# Patient Record
Sex: Female | Born: 1940 | Race: Black or African American | Hispanic: No | Smoking: Never smoker
Health system: Southern US, Community
[De-identification: ages and names within clinical notes are randomized; demographics above are authoritative.]

## PROBLEM LIST (undated history)

## (undated) DIAGNOSIS — I10 Essential (primary) hypertension: Secondary | ICD-10-CM

## (undated) DIAGNOSIS — M1990 Unspecified osteoarthritis, unspecified site: Secondary | ICD-10-CM

## (undated) DIAGNOSIS — D693 Immune thrombocytopenic purpura: Secondary | ICD-10-CM

## (undated) DIAGNOSIS — R32 Unspecified urinary incontinence: Secondary | ICD-10-CM

## (undated) DIAGNOSIS — M109 Gout, unspecified: Secondary | ICD-10-CM

## (undated) DIAGNOSIS — D509 Iron deficiency anemia, unspecified: Secondary | ICD-10-CM

## (undated) DIAGNOSIS — K219 Gastro-esophageal reflux disease without esophagitis: Secondary | ICD-10-CM

## (undated) DIAGNOSIS — J309 Allergic rhinitis, unspecified: Secondary | ICD-10-CM

## (undated) DIAGNOSIS — C9210 Chronic myeloid leukemia, BCR/ABL-positive, not having achieved remission: Secondary | ICD-10-CM

## (undated) DIAGNOSIS — E785 Hyperlipidemia, unspecified: Secondary | ICD-10-CM

## (undated) DIAGNOSIS — Z8639 Personal history of other endocrine, nutritional and metabolic disease: Secondary | ICD-10-CM

## (undated) DIAGNOSIS — E039 Hypothyroidism, unspecified: Secondary | ICD-10-CM

## (undated) HISTORY — DX: Gastro-esophageal reflux disease without esophagitis: K21.9

## (undated) HISTORY — DX: Hypothyroidism, unspecified: E03.9

## (undated) HISTORY — DX: Iron deficiency anemia, unspecified: D50.9

## (undated) HISTORY — DX: Personal history of other endocrine, nutritional and metabolic disease: Z86.39

## (undated) HISTORY — DX: Chronic myeloid leukemia, BCR/ABL-positive, not having achieved remission: C92.10

## (undated) HISTORY — DX: Immune thrombocytopenic purpura: D69.3

## (undated) HISTORY — DX: Unspecified osteoarthritis, unspecified site: M19.90

## (undated) HISTORY — DX: Allergic rhinitis, unspecified: J30.9

## (undated) HISTORY — DX: Gout, unspecified: M10.9

## (undated) HISTORY — DX: Hyperlipidemia, unspecified: E78.5

## (undated) HISTORY — DX: Unspecified urinary incontinence: R32

---

## 2004-07-16 ENCOUNTER — Ambulatory Visit: Payer: Self-pay | Admitting: Internal Medicine

## 2004-08-13 ENCOUNTER — Ambulatory Visit: Payer: Self-pay | Admitting: Internal Medicine

## 2004-10-17 ENCOUNTER — Ambulatory Visit: Payer: Self-pay | Admitting: Internal Medicine

## 2004-11-13 ENCOUNTER — Ambulatory Visit: Payer: Self-pay | Admitting: Internal Medicine

## 2004-12-11 ENCOUNTER — Ambulatory Visit: Payer: Self-pay | Admitting: Internal Medicine

## 2005-01-14 ENCOUNTER — Ambulatory Visit: Payer: Self-pay | Admitting: Internal Medicine

## 2005-01-14 ENCOUNTER — Ambulatory Visit: Payer: Self-pay | Admitting: Family Medicine

## 2005-01-27 ENCOUNTER — Ambulatory Visit: Payer: Self-pay | Admitting: Family Medicine

## 2005-02-10 ENCOUNTER — Ambulatory Visit: Payer: Self-pay | Admitting: Internal Medicine

## 2005-03-13 ENCOUNTER — Ambulatory Visit: Payer: Self-pay | Admitting: Internal Medicine

## 2005-04-18 ENCOUNTER — Ambulatory Visit: Payer: Self-pay | Admitting: Internal Medicine

## 2005-05-13 ENCOUNTER — Ambulatory Visit: Payer: Self-pay | Admitting: Internal Medicine

## 2005-06-13 ENCOUNTER — Ambulatory Visit: Payer: Self-pay | Admitting: Internal Medicine

## 2005-07-13 ENCOUNTER — Ambulatory Visit: Payer: Self-pay | Admitting: Internal Medicine

## 2005-08-13 ENCOUNTER — Ambulatory Visit: Payer: Self-pay | Admitting: Internal Medicine

## 2005-08-22 ENCOUNTER — Ambulatory Visit: Payer: Self-pay | Admitting: Family Medicine

## 2005-09-12 ENCOUNTER — Ambulatory Visit: Payer: Self-pay | Admitting: Internal Medicine

## 2005-10-13 ENCOUNTER — Ambulatory Visit: Payer: Self-pay | Admitting: Internal Medicine

## 2005-11-13 ENCOUNTER — Ambulatory Visit: Payer: Self-pay | Admitting: Internal Medicine

## 2005-12-11 ENCOUNTER — Ambulatory Visit: Payer: Self-pay | Admitting: Internal Medicine

## 2006-01-11 ENCOUNTER — Ambulatory Visit: Payer: Self-pay | Admitting: Internal Medicine

## 2006-02-10 ENCOUNTER — Ambulatory Visit: Payer: Self-pay | Admitting: Internal Medicine

## 2006-02-18 ENCOUNTER — Ambulatory Visit: Payer: Self-pay

## 2006-03-13 ENCOUNTER — Ambulatory Visit: Payer: Self-pay | Admitting: Internal Medicine

## 2006-06-22 ENCOUNTER — Emergency Department: Payer: Self-pay | Admitting: Unknown Physician Specialty

## 2006-07-09 ENCOUNTER — Ambulatory Visit: Payer: Self-pay | Admitting: Internal Medicine

## 2006-07-13 ENCOUNTER — Ambulatory Visit: Payer: Self-pay | Admitting: Internal Medicine

## 2006-08-13 ENCOUNTER — Ambulatory Visit: Payer: Self-pay | Admitting: Internal Medicine

## 2006-12-07 ENCOUNTER — Ambulatory Visit: Payer: Self-pay | Admitting: Internal Medicine

## 2006-12-12 ENCOUNTER — Ambulatory Visit: Payer: Self-pay | Admitting: Internal Medicine

## 2007-04-20 ENCOUNTER — Ambulatory Visit: Payer: Self-pay

## 2007-05-14 ENCOUNTER — Ambulatory Visit: Payer: Self-pay | Admitting: Internal Medicine

## 2007-05-24 ENCOUNTER — Ambulatory Visit: Payer: Self-pay | Admitting: Internal Medicine

## 2007-06-14 ENCOUNTER — Ambulatory Visit: Payer: Self-pay | Admitting: Internal Medicine

## 2007-09-13 ENCOUNTER — Ambulatory Visit: Payer: Self-pay | Admitting: Internal Medicine

## 2007-09-21 ENCOUNTER — Ambulatory Visit: Payer: Self-pay | Admitting: Internal Medicine

## 2007-10-14 ENCOUNTER — Ambulatory Visit: Payer: Self-pay | Admitting: Internal Medicine

## 2008-01-12 ENCOUNTER — Ambulatory Visit: Payer: Self-pay | Admitting: Internal Medicine

## 2008-01-25 ENCOUNTER — Ambulatory Visit: Payer: Self-pay | Admitting: Internal Medicine

## 2008-02-11 ENCOUNTER — Ambulatory Visit: Payer: Self-pay | Admitting: Internal Medicine

## 2008-03-21 ENCOUNTER — Ambulatory Visit: Payer: Self-pay | Admitting: Internal Medicine

## 2008-04-12 ENCOUNTER — Ambulatory Visit: Payer: Self-pay | Admitting: Internal Medicine

## 2008-04-21 ENCOUNTER — Ambulatory Visit: Payer: Self-pay

## 2008-05-13 ENCOUNTER — Ambulatory Visit: Payer: Self-pay | Admitting: Internal Medicine

## 2008-05-24 ENCOUNTER — Ambulatory Visit: Payer: Self-pay | Admitting: Internal Medicine

## 2008-06-13 ENCOUNTER — Ambulatory Visit: Payer: Self-pay | Admitting: Internal Medicine

## 2008-07-19 ENCOUNTER — Ambulatory Visit: Payer: Self-pay | Admitting: Internal Medicine

## 2008-09-12 ENCOUNTER — Ambulatory Visit: Payer: Self-pay | Admitting: Internal Medicine

## 2008-09-13 ENCOUNTER — Ambulatory Visit: Payer: Self-pay | Admitting: Internal Medicine

## 2008-10-13 ENCOUNTER — Ambulatory Visit: Payer: Self-pay | Admitting: Internal Medicine

## 2008-11-13 ENCOUNTER — Ambulatory Visit: Payer: Self-pay | Admitting: Internal Medicine

## 2008-12-11 ENCOUNTER — Ambulatory Visit: Payer: Self-pay | Admitting: Internal Medicine

## 2009-01-03 ENCOUNTER — Ambulatory Visit: Payer: Self-pay | Admitting: Internal Medicine

## 2009-01-11 ENCOUNTER — Ambulatory Visit: Payer: Self-pay | Admitting: Internal Medicine

## 2009-02-10 ENCOUNTER — Ambulatory Visit: Payer: Self-pay | Admitting: Internal Medicine

## 2009-03-13 ENCOUNTER — Ambulatory Visit: Payer: Self-pay | Admitting: Internal Medicine

## 2009-04-04 ENCOUNTER — Ambulatory Visit: Payer: Self-pay | Admitting: Internal Medicine

## 2009-04-12 ENCOUNTER — Ambulatory Visit: Payer: Self-pay | Admitting: Internal Medicine

## 2009-04-23 ENCOUNTER — Ambulatory Visit: Payer: Self-pay

## 2009-05-13 ENCOUNTER — Ambulatory Visit: Payer: Self-pay | Admitting: Internal Medicine

## 2009-06-13 ENCOUNTER — Ambulatory Visit: Payer: Self-pay | Admitting: Internal Medicine

## 2009-07-13 ENCOUNTER — Ambulatory Visit: Payer: Self-pay | Admitting: Internal Medicine

## 2009-07-25 ENCOUNTER — Ambulatory Visit: Payer: Self-pay | Admitting: Internal Medicine

## 2009-08-13 ENCOUNTER — Ambulatory Visit: Payer: Self-pay | Admitting: Internal Medicine

## 2009-09-19 ENCOUNTER — Ambulatory Visit: Payer: Self-pay | Admitting: Internal Medicine

## 2009-10-13 ENCOUNTER — Ambulatory Visit: Payer: Self-pay | Admitting: Internal Medicine

## 2009-11-13 ENCOUNTER — Ambulatory Visit: Payer: Self-pay | Admitting: Internal Medicine

## 2009-11-14 ENCOUNTER — Ambulatory Visit: Payer: Self-pay | Admitting: Internal Medicine

## 2009-12-11 ENCOUNTER — Ambulatory Visit: Payer: Self-pay | Admitting: Internal Medicine

## 2009-12-20 ENCOUNTER — Ambulatory Visit: Payer: Self-pay | Admitting: Sports Medicine

## 2010-01-11 ENCOUNTER — Ambulatory Visit: Payer: Self-pay | Admitting: Internal Medicine

## 2010-03-06 ENCOUNTER — Ambulatory Visit: Payer: Self-pay | Admitting: Internal Medicine

## 2010-03-13 ENCOUNTER — Ambulatory Visit: Payer: Self-pay | Admitting: Internal Medicine

## 2010-04-12 ENCOUNTER — Ambulatory Visit: Payer: Self-pay | Admitting: Internal Medicine

## 2010-04-25 ENCOUNTER — Ambulatory Visit: Payer: Self-pay | Admitting: Family Medicine

## 2010-05-01 ENCOUNTER — Ambulatory Visit: Payer: Self-pay | Admitting: Internal Medicine

## 2010-05-13 ENCOUNTER — Ambulatory Visit: Payer: Self-pay | Admitting: Internal Medicine

## 2010-06-13 ENCOUNTER — Ambulatory Visit: Payer: Self-pay | Admitting: Internal Medicine

## 2010-06-25 ENCOUNTER — Ambulatory Visit: Payer: Self-pay | Admitting: Internal Medicine

## 2010-07-13 ENCOUNTER — Ambulatory Visit: Payer: Self-pay | Admitting: Internal Medicine

## 2010-08-20 ENCOUNTER — Ambulatory Visit: Payer: Self-pay | Admitting: Internal Medicine

## 2010-09-12 ENCOUNTER — Ambulatory Visit: Payer: Self-pay | Admitting: Internal Medicine

## 2010-10-22 ENCOUNTER — Ambulatory Visit: Payer: Self-pay | Admitting: Internal Medicine

## 2010-11-13 ENCOUNTER — Ambulatory Visit: Payer: Self-pay | Admitting: Internal Medicine

## 2010-12-17 ENCOUNTER — Ambulatory Visit: Payer: Self-pay | Admitting: Internal Medicine

## 2011-01-12 ENCOUNTER — Ambulatory Visit: Payer: Self-pay | Admitting: Internal Medicine

## 2011-02-11 ENCOUNTER — Ambulatory Visit: Payer: Self-pay | Admitting: Internal Medicine

## 2011-03-14 ENCOUNTER — Ambulatory Visit: Payer: Self-pay | Admitting: Internal Medicine

## 2011-04-13 ENCOUNTER — Ambulatory Visit: Payer: Self-pay | Admitting: Internal Medicine

## 2011-05-27 ENCOUNTER — Ambulatory Visit: Payer: Self-pay | Admitting: Urology

## 2011-06-03 ENCOUNTER — Ambulatory Visit: Payer: Self-pay | Admitting: Family Medicine

## 2011-06-03 ENCOUNTER — Ambulatory Visit: Payer: Self-pay | Admitting: Internal Medicine

## 2011-06-14 ENCOUNTER — Ambulatory Visit: Payer: Self-pay | Admitting: Internal Medicine

## 2011-08-05 ENCOUNTER — Ambulatory Visit: Payer: Self-pay | Admitting: Internal Medicine

## 2011-08-14 ENCOUNTER — Ambulatory Visit: Payer: Self-pay | Admitting: Internal Medicine

## 2011-09-30 ENCOUNTER — Ambulatory Visit: Payer: Self-pay | Admitting: Internal Medicine

## 2011-10-14 ENCOUNTER — Ambulatory Visit: Payer: Self-pay | Admitting: Internal Medicine

## 2011-12-16 ENCOUNTER — Ambulatory Visit: Payer: Self-pay | Admitting: Internal Medicine

## 2011-12-16 LAB — CBC CANCER CENTER
Bands: 1 %
Basophil %: 0.5 %
Basophil: 1 %
Eosinophil #: 0.2 x10 3/mm (ref 0.0–0.7)
Eosinophil %: 2.7 %
Eosinophil: 2 %
HCT: 37.1 % (ref 35.0–47.0)
HGB: 12.2 g/dL (ref 12.0–16.0)
Lymphocyte #: 1.9 x10 3/mm (ref 1.0–3.6)
MCH: 28.8 pg (ref 26.0–34.0)
MCHC: 32.9 g/dL (ref 32.0–36.0)
Monocyte #: 3.1 x10 3/mm — ABNORMAL HIGH (ref 0.0–0.7)
Neutrophil %: 39.4 %
RBC: 4.24 10*6/uL (ref 3.80–5.20)
Segmented Neutrophils: 37 %

## 2011-12-16 LAB — CREATININE, SERUM: EGFR (African American): 51 — ABNORMAL LOW

## 2011-12-19 LAB — BASIC METABOLIC PANEL
Anion Gap: 6 — ABNORMAL LOW (ref 7–16)
Calcium, Total: 9 mg/dL (ref 8.5–10.1)
Chloride: 106 mmol/L (ref 98–107)
Co2: 30 mmol/L (ref 21–32)
Creatinine: 1.3 mg/dL (ref 0.60–1.30)
Osmolality: 286 (ref 275–301)

## 2012-01-12 ENCOUNTER — Ambulatory Visit: Payer: Self-pay | Admitting: Internal Medicine

## 2012-01-27 LAB — CREATININE, SERUM
EGFR (African American): 47 — ABNORMAL LOW
EGFR (Non-African Amer.): 40 — ABNORMAL LOW

## 2012-01-27 LAB — CBC CANCER CENTER
Basophil #: 0.3 x10 3/mm — ABNORMAL HIGH (ref 0.0–0.1)
Basophil %: 2.7 %
Basophil: 1 %
Comment - H1-Com2: NORMAL
HCT: 37.1 % (ref 35.0–47.0)
HGB: 12.2 g/dL (ref 12.0–16.0)
Lymphocytes: 18 %
MCH: 29.1 pg (ref 26.0–34.0)
MCHC: 32.8 g/dL (ref 32.0–36.0)
Monocyte #: 3.8 x10 3/mm — ABNORMAL HIGH (ref 0.2–0.9)
Monocyte %: 36.9 %
Neutrophil #: 4.2 x10 3/mm (ref 1.4–6.5)
Neutrophil %: 41.2 %
Platelet: 80 x10 3/mm — ABNORMAL LOW (ref 150–440)
Segmented Neutrophils: 48 %
WBC: 10.2 x10 3/mm (ref 3.6–11.0)

## 2012-01-27 LAB — LACTATE DEHYDROGENASE: LDH: 156 U/L (ref 84–246)

## 2012-02-11 ENCOUNTER — Ambulatory Visit: Payer: Self-pay | Admitting: Internal Medicine

## 2012-03-23 ENCOUNTER — Ambulatory Visit: Payer: Self-pay | Admitting: Internal Medicine

## 2012-03-23 LAB — CREATININE, SERUM
Creatinine: 1.24 mg/dL (ref 0.60–1.30)
EGFR (Non-African Amer.): 44 — ABNORMAL LOW

## 2012-03-23 LAB — LACTATE DEHYDROGENASE: LDH: 142 U/L (ref 84–246)

## 2012-03-23 LAB — CBC CANCER CENTER
Eosinophil #: 0.2 x10 3/mm (ref 0.0–0.7)
Eosinophil %: 2.7 %
Lymphocyte #: 1.7 x10 3/mm (ref 1.0–3.6)
MCH: 29.1 pg (ref 26.0–34.0)
MCHC: 33.1 g/dL (ref 32.0–36.0)
MCV: 88 fL (ref 80–100)
Neutrophil #: 3.1 x10 3/mm (ref 1.4–6.5)
Platelet: 67 x10 3/mm — ABNORMAL LOW (ref 150–440)
WBC: 8.8 x10 3/mm (ref 3.6–11.0)

## 2012-04-12 ENCOUNTER — Ambulatory Visit: Payer: Self-pay | Admitting: Internal Medicine

## 2012-05-18 ENCOUNTER — Ambulatory Visit: Payer: Self-pay | Admitting: Internal Medicine

## 2012-05-18 LAB — CBC CANCER CENTER
Eosinophil: 4 %
HCT: 38.6 % (ref 35.0–47.0)
Lymphocytes: 22 %
MCV: 88 fL (ref 80–100)
Monocytes: 35 %
Platelet: 85 x10 3/mm — ABNORMAL LOW (ref 150–440)
RBC: 4.38 10*6/uL (ref 3.80–5.20)
RDW: 14.3 % (ref 11.5–14.5)
WBC: 12.1 x10 3/mm — ABNORMAL HIGH (ref 3.6–11.0)

## 2012-05-18 LAB — CREATININE, SERUM
Creatinine: 1.27 mg/dL (ref 0.60–1.30)
EGFR (African American): 50 — ABNORMAL LOW
EGFR (Non-African Amer.): 43 — ABNORMAL LOW

## 2012-05-18 LAB — URIC ACID: Uric Acid: 7.8 mg/dL — ABNORMAL HIGH (ref 2.6–6.0)

## 2012-06-13 ENCOUNTER — Ambulatory Visit: Payer: Self-pay | Admitting: Internal Medicine

## 2012-07-23 ENCOUNTER — Ambulatory Visit: Payer: Self-pay | Admitting: Family Medicine

## 2012-09-14 ENCOUNTER — Ambulatory Visit: Payer: Self-pay | Admitting: Internal Medicine

## 2012-09-14 LAB — URIC ACID: Uric Acid: 7.3 mg/dL — ABNORMAL HIGH (ref 2.6–6.0)

## 2012-09-14 LAB — CBC CANCER CENTER
Comment - H1-Com1: NORMAL
MCH: 28.7 pg (ref 26.0–34.0)
MCHC: 32.3 g/dL (ref 32.0–36.0)
MCV: 89 fL (ref 80–100)
Monocytes: 33 %
Platelet: 81 x10 3/mm — ABNORMAL LOW (ref 150–440)
RBC: 4.47 10*6/uL (ref 3.80–5.20)
RDW: 13.7 % (ref 11.5–14.5)
Segmented Neutrophils: 43 %

## 2012-09-14 LAB — CREATININE, SERUM
Creatinine: 1.44 mg/dL — ABNORMAL HIGH (ref 0.60–1.30)
EGFR (Non-African Amer.): 37 — ABNORMAL LOW

## 2012-09-28 LAB — CREATININE, SERUM
EGFR (African American): 47 — ABNORMAL LOW
EGFR (Non-African Amer.): 40 — ABNORMAL LOW

## 2012-10-13 ENCOUNTER — Ambulatory Visit: Payer: Self-pay | Admitting: Internal Medicine

## 2012-10-26 LAB — CBC CANCER CENTER
Basophil: 1 %
Eosinophil: 2 %
HGB: 12.6 g/dL (ref 12.0–16.0)
Lymphocytes: 20 %
MCH: 28.3 pg (ref 26.0–34.0)
Monocytes: 33 %
RDW: 14 % (ref 11.5–14.5)
WBC: 8.1 x10 3/mm (ref 3.6–11.0)

## 2012-10-26 LAB — LACTATE DEHYDROGENASE: LDH: 172 U/L (ref 81–246)

## 2012-10-26 LAB — CREATININE, SERUM
Creatinine: 1.24 mg/dL (ref 0.60–1.30)
EGFR (African American): 51 — ABNORMAL LOW

## 2012-11-13 ENCOUNTER — Ambulatory Visit: Payer: Self-pay | Admitting: Internal Medicine

## 2013-01-18 ENCOUNTER — Ambulatory Visit: Payer: Self-pay | Admitting: Internal Medicine

## 2013-01-18 LAB — CBC CANCER CENTER
Bands: 2 %
Basophil: 3 %
HCT: 31.1 % — ABNORMAL LOW (ref 35.0–47.0)
HGB: 10 g/dL — ABNORMAL LOW (ref 12.0–16.0)
Lymphocytes: 9 %
MCHC: 32 g/dL (ref 32.0–36.0)
MCV: 85 fL (ref 80–100)
RBC: 3.66 10*6/uL — ABNORMAL LOW (ref 3.80–5.20)
RDW: 13.7 % (ref 11.5–14.5)
Segmented Neutrophils: 42 %
WBC: 13.9 x10 3/mm — ABNORMAL HIGH (ref 3.6–11.0)

## 2013-01-18 LAB — LACTATE DEHYDROGENASE: LDH: 148 U/L (ref 81–246)

## 2013-01-18 LAB — CREATININE, SERUM: EGFR (African American): 39 — ABNORMAL LOW

## 2013-01-26 ENCOUNTER — Ambulatory Visit: Payer: Self-pay | Admitting: Family Medicine

## 2013-02-01 LAB — BASIC METABOLIC PANEL
Anion Gap: 11 (ref 7–16)
BUN: 28 mg/dL — ABNORMAL HIGH (ref 7–18)
Chloride: 100 mmol/L (ref 98–107)
Co2: 27 mmol/L (ref 21–32)
Creatinine: 1.86 mg/dL — ABNORMAL HIGH (ref 0.60–1.30)
Glucose: 116 mg/dL — ABNORMAL HIGH (ref 65–99)

## 2013-02-01 LAB — CREATININE, SERUM
Creatinine: 1.89 mg/dL — ABNORMAL HIGH (ref 0.60–1.30)
EGFR (Non-African Amer.): 27 — ABNORMAL LOW

## 2013-02-04 LAB — CBC CANCER CENTER
Bands: 1 %
Eosinophil: 1 %
HCT: 31 % — ABNORMAL LOW (ref 35.0–47.0)
HGB: 10.1 g/dL — ABNORMAL LOW (ref 12.0–16.0)
Lymphocytes: 13 %
RBC: 3.76 10*6/uL — ABNORMAL LOW (ref 3.80–5.20)
Segmented Neutrophils: 59 %
WBC: 14 x10 3/mm — ABNORMAL HIGH (ref 3.6–11.0)

## 2013-02-10 ENCOUNTER — Ambulatory Visit: Payer: Self-pay | Admitting: Internal Medicine

## 2013-03-15 ENCOUNTER — Ambulatory Visit: Payer: Self-pay | Admitting: Internal Medicine

## 2013-03-15 LAB — CBC CANCER CENTER
Basophil %: 2.8 %
Eosinophil #: 0.3 x10 3/mm (ref 0.0–0.7)
Eosinophil: 2 %
HCT: 28.3 % — ABNORMAL LOW (ref 35.0–47.0)
HGB: 9.2 g/dL — ABNORMAL LOW (ref 12.0–16.0)
Lymphocyte #: 2.1 x10 3/mm (ref 1.0–3.6)
Lymphocyte %: 16.8 %
Monocyte %: 25.3 %
Neutrophil #: 6.6 x10 3/mm — ABNORMAL HIGH (ref 1.4–6.5)
Neutrophil %: 53 %
RDW: 16.8 % — ABNORMAL HIGH (ref 11.5–14.5)
WBC: 12.5 x10 3/mm — ABNORMAL HIGH (ref 3.6–11.0)

## 2013-03-15 LAB — URIC ACID: Uric Acid: 12.2 mg/dL — ABNORMAL HIGH (ref 2.6–6.0)

## 2013-03-15 LAB — CREATININE, SERUM
EGFR (African American): 33 — ABNORMAL LOW
EGFR (Non-African Amer.): 29 — ABNORMAL LOW

## 2013-03-29 LAB — CBC CANCER CENTER
Basophil %: 3.5 %
Eosinophil #: 0.1 x10 3/mm (ref 0.0–0.7)
Eosinophil %: 1.2 %
HGB: 8.9 g/dL — ABNORMAL LOW (ref 12.0–16.0)
Lymphocyte #: 1.5 x10 3/mm (ref 1.0–3.6)
Lymphocyte %: 13.9 %
MCHC: 32.1 g/dL (ref 32.0–36.0)
MCV: 78 fL — ABNORMAL LOW (ref 80–100)
Monocyte %: 29.7 %
Neutrophil #: 5.7 x10 3/mm (ref 1.4–6.5)
Neutrophil %: 51.7 %
RBC: 3.52 10*6/uL — ABNORMAL LOW (ref 3.80–5.20)
RDW: 17.8 % — ABNORMAL HIGH (ref 11.5–14.5)
WBC: 11.1 x10 3/mm — ABNORMAL HIGH (ref 3.6–11.0)

## 2013-03-29 LAB — GLUCOSE, RANDOM: Glucose: 121 mg/dL — ABNORMAL HIGH (ref 65–99)

## 2013-03-29 LAB — TSH: Thyroid Stimulating Horm: 2.75 u[IU]/mL

## 2013-04-12 ENCOUNTER — Ambulatory Visit: Payer: Self-pay | Admitting: Internal Medicine

## 2013-04-12 LAB — IRON AND TIBC
Iron Bind.Cap.(Total): 152 ug/dL — ABNORMAL LOW (ref 250–450)
Iron Saturation: 12 %
Iron: 18 ug/dL — ABNORMAL LOW (ref 50–170)

## 2013-04-19 LAB — CBC CANCER CENTER
Basophil %: 1 %
Basophil: 1 %
Eosinophil #: 0.1 x10 3/mm (ref 0.0–0.7)
Eosinophil %: 0.6 %
Eosinophil: 1 %
MCHC: 31.8 g/dL — ABNORMAL LOW (ref 32.0–36.0)
MCV: 77 fL — ABNORMAL LOW (ref 80–100)
Monocyte #: 2.2 x10 3/mm — ABNORMAL HIGH (ref 0.2–0.9)
Platelet: 109 x10 3/mm — ABNORMAL LOW (ref 150–440)
RBC: 3.81 10*6/uL (ref 3.80–5.20)
Segmented Neutrophils: 60 %

## 2013-04-19 LAB — GLUCOSE, RANDOM: Glucose: 91 mg/dL (ref 65–99)

## 2013-04-19 LAB — URIC ACID: Uric Acid: 17.6 mg/dL — ABNORMAL HIGH (ref 2.6–6.0)

## 2013-04-19 LAB — HEPATIC FUNCTION PANEL A (ARMC)
Albumin: 3.2 g/dL — ABNORMAL LOW (ref 3.4–5.0)
Alkaline Phosphatase: 50 U/L (ref 50–136)
SGOT(AST): 22 U/L (ref 15–37)
Total Protein: 9.1 g/dL — ABNORMAL HIGH (ref 6.4–8.2)

## 2013-04-19 LAB — CREATININE, SERUM
EGFR (African American): 25 — ABNORMAL LOW
EGFR (Non-African Amer.): 22 — ABNORMAL LOW

## 2013-04-19 LAB — RETICULOCYTES: Reticulocyte: 1.12 %

## 2013-04-21 LAB — KAPPA/LAMBDA FREE LIGHT CHAINS (ARMC)

## 2013-04-21 LAB — PROT IMMUNOELECTROPHORES(ARMC)

## 2013-04-27 DIAGNOSIS — R509 Fever, unspecified: Secondary | ICD-10-CM | POA: Insufficient documentation

## 2013-05-10 ENCOUNTER — Emergency Department (HOSPITAL_COMMUNITY): Payer: Medicare Other

## 2013-05-10 ENCOUNTER — Inpatient Hospital Stay (HOSPITAL_COMMUNITY)
Admission: EM | Admit: 2013-05-10 | Discharge: 2013-05-16 | DRG: 871 | Disposition: A | Discharge status: Home / Self Care | Payer: Medicare Other | Attending: Internal Medicine | Admitting: Internal Medicine

## 2013-05-10 ENCOUNTER — Encounter (HOSPITAL_COMMUNITY): Payer: Self-pay | Admitting: Emergency Medicine

## 2013-05-10 DIAGNOSIS — D509 Iron deficiency anemia, unspecified: Secondary | ICD-10-CM | POA: Diagnosis present

## 2013-05-10 DIAGNOSIS — A419 Sepsis, unspecified organism: Principal | ICD-10-CM | POA: Diagnosis present

## 2013-05-10 DIAGNOSIS — C9250 Acute myelomonocytic leukemia, not having achieved remission: Secondary | ICD-10-CM

## 2013-05-10 DIAGNOSIS — B370 Candidal stomatitis: Secondary | ICD-10-CM | POA: Diagnosis present

## 2013-05-10 DIAGNOSIS — E43 Unspecified severe protein-calorie malnutrition: Secondary | ICD-10-CM | POA: Insufficient documentation

## 2013-05-10 DIAGNOSIS — R634 Abnormal weight loss: Secondary | ICD-10-CM | POA: Diagnosis present

## 2013-05-10 DIAGNOSIS — I10 Essential (primary) hypertension: Secondary | ICD-10-CM | POA: Diagnosis present

## 2013-05-10 DIAGNOSIS — Z856 Personal history of leukemia: Secondary | ICD-10-CM

## 2013-05-10 DIAGNOSIS — J189 Pneumonia, unspecified organism: Secondary | ICD-10-CM | POA: Diagnosis present

## 2013-05-10 DIAGNOSIS — I498 Other specified cardiac arrhythmias: Secondary | ICD-10-CM | POA: Diagnosis present

## 2013-05-10 DIAGNOSIS — R1310 Dysphagia, unspecified: Secondary | ICD-10-CM | POA: Diagnosis present

## 2013-05-10 DIAGNOSIS — C9290 Myeloid leukemia, unspecified, not having achieved remission: Secondary | ICD-10-CM | POA: Diagnosis present

## 2013-05-10 HISTORY — DX: Essential (primary) hypertension: I10

## 2013-05-10 LAB — URINALYSIS, ROUTINE W REFLEX MICROSCOPIC
Ketones, ur: 15 mg/dL — AB
Leukocytes, UA: NEGATIVE
Nitrite: NEGATIVE
Urobilinogen, UA: 1 mg/dL (ref 0.0–1.0)
pH: 5.5 (ref 5.0–8.0)

## 2013-05-10 LAB — CBC WITH DIFFERENTIAL/PLATELET
Basophils Absolute: 0 10*3/uL (ref 0.0–0.1)
Basophils Relative: 0 % (ref 0–1)
Eosinophils Absolute: 0.4 10*3/uL (ref 0.0–0.7)
HCT: 27.2 % — ABNORMAL LOW (ref 36.0–46.0)
Hemoglobin: 8.7 g/dL — ABNORMAL LOW (ref 12.0–15.0)
Lymphs Abs: 1.7 10*3/uL (ref 0.7–4.0)
MCHC: 32 g/dL (ref 30.0–36.0)
MCV: 76.2 fL — ABNORMAL LOW (ref 78.0–100.0)
Monocytes Relative: 22 % — ABNORMAL HIGH (ref 3–12)
Neutro Abs: 14.3 10*3/uL — ABNORMAL HIGH (ref 1.7–7.7)
RDW: 19.2 % — ABNORMAL HIGH (ref 11.5–15.5)

## 2013-05-10 LAB — URINE MICROSCOPIC-ADD ON

## 2013-05-10 LAB — CREATININE, SERUM
Creatinine, Ser: 1.06 mg/dL (ref 0.50–1.10)
GFR calc Af Amer: 60 mL/min — ABNORMAL LOW (ref >90)

## 2013-05-10 LAB — CBC
Hemoglobin: 7.9 g/dL — ABNORMAL LOW (ref 12.0–15.0)
MCV: 75.2 fL — ABNORMAL LOW (ref 78.0–100.0)
Platelets: 171 10*3/uL (ref 150–400)
RBC: 3.27 MIL/uL — ABNORMAL LOW (ref 3.87–5.11)
WBC: 23.2 10*3/uL — ABNORMAL HIGH (ref 4.0–10.5)

## 2013-05-10 LAB — COMPREHENSIVE METABOLIC PANEL
Alkaline Phosphatase: 44 U/L (ref 39–117)
BUN: 11 mg/dL (ref 6–23)
CO2: 22 mEq/L (ref 19–32)
Chloride: 99 mEq/L (ref 96–112)
GFR calc Af Amer: 71 mL/min — ABNORMAL LOW (ref >90)
GFR calc non Af Amer: 61 mL/min — ABNORMAL LOW (ref >90)
Glucose, Bld: 86 mg/dL (ref 70–99)
Potassium: 4 mEq/L (ref 3.5–5.1)
Total Bilirubin: 0.6 mg/dL (ref 0.3–1.2)

## 2013-05-10 LAB — LIPASE, BLOOD: Lipase: 23 U/L (ref 11–59)

## 2013-05-10 MED ORDER — SODIUM CHLORIDE 0.9 % IV SOLN
INTRAVENOUS | Status: DC
  Administered 2013-05-10: 75 mL/h via INTRAVENOUS
  Administered 2013-05-12: 08:00:00 via INTRAVENOUS

## 2013-05-10 MED ORDER — SODIUM CHLORIDE 0.9 % IV BOLUS (SEPSIS)
1000.0000 mL | Freq: Once | INTRAVENOUS | Status: AC
  Administered 2013-05-10: 1000 mL via INTRAVENOUS

## 2013-05-10 MED ORDER — SODIUM CHLORIDE 0.9 % IV SOLN
INTRAVENOUS | Status: AC
  Administered 2013-05-10: 100 mL/h via INTRAVENOUS

## 2013-05-10 MED ORDER — ACETAMINOPHEN 325 MG PO TABS
650.0000 mg | ORAL_TABLET | Freq: Once | ORAL | Status: AC
  Administered 2013-05-10: 650 mg via ORAL
  Filled 2013-05-10: qty 2

## 2013-05-10 MED ORDER — ACETAMINOPHEN 325 MG PO TABS
650.0000 mg | ORAL_TABLET | Freq: Four times a day (QID) | ORAL | Status: DC | PRN
  Administered 2013-05-11 – 2013-05-15 (×4): 650 mg via ORAL
  Filled 2013-05-10 (×4): qty 2

## 2013-05-10 MED ORDER — VANCOMYCIN HCL 500 MG IV SOLR
500.0000 mg | Freq: Two times a day (BID) | INTRAVENOUS | Status: DC
  Administered 2013-05-11 – 2013-05-13 (×5): 500 mg via INTRAVENOUS
  Filled 2013-05-10 (×6): qty 500

## 2013-05-10 MED ORDER — METOPROLOL TARTRATE 25 MG PO TABS
25.0000 mg | ORAL_TABLET | Freq: Two times a day (BID) | ORAL | Status: DC
  Administered 2013-05-10 – 2013-05-16 (×12): 25 mg via ORAL
  Filled 2013-05-10 (×14): qty 1

## 2013-05-10 MED ORDER — PIPERACILLIN-TAZOBACTAM 3.375 G IVPB: 3.3750 g | Freq: Once | INTRAVENOUS | Status: DC

## 2013-05-10 MED ORDER — ALBUTEROL SULFATE (5 MG/ML) 0.5% IN NEBU: 2.5000 mg | INHALATION_SOLUTION | RESPIRATORY_TRACT | Status: AC | PRN

## 2013-05-10 MED ORDER — VANCOMYCIN HCL IN DEXTROSE 1-5 GM/200ML-% IV SOLN
1000.0000 mg | Freq: Once | INTRAVENOUS | Status: AC
  Administered 2013-05-10: 1000 mg via INTRAVENOUS
  Filled 2013-05-10: qty 200

## 2013-05-10 MED ORDER — FEBUXOSTAT 40 MG PO TABS
80.0000 mg | ORAL_TABLET | Freq: Every day | ORAL | Status: DC
  Administered 2013-05-11 – 2013-05-16 (×6): 80 mg via ORAL
  Filled 2013-05-10 (×7): qty 2

## 2013-05-10 MED ORDER — PANTOPRAZOLE SODIUM 40 MG PO TBEC
40.0000 mg | DELAYED_RELEASE_TABLET | Freq: Every day | ORAL | Status: DC
  Administered 2013-05-11 – 2013-05-16 (×6): 40 mg via ORAL
  Filled 2013-05-10 (×6): qty 1

## 2013-05-10 MED ORDER — ONDANSETRON HCL 4 MG PO TABS: 4.0000 mg | ORAL_TABLET | Freq: Four times a day (QID) | ORAL | Status: DC | PRN

## 2013-05-10 MED ORDER — DOXYCYCLINE HYCLATE 100 MG PO TABS
100.0000 mg | ORAL_TABLET | Freq: Two times a day (BID) | ORAL | Status: AC
  Administered 2013-05-10 – 2013-05-13 (×6): 100 mg via ORAL
  Filled 2013-05-10 (×7): qty 1

## 2013-05-10 MED ORDER — DEXTROSE 5 % IV SOLN
1.0000 g | Freq: Three times a day (TID) | INTRAVENOUS | Status: DC
  Administered 2013-05-10 – 2013-05-13 (×9): 1 g via INTRAVENOUS
  Filled 2013-05-10 (×14): qty 1

## 2013-05-10 MED ORDER — POLYETHYLENE GLYCOL 3350 17 G PO PACK
17.0000 g | PACK | Freq: Every day | ORAL | Status: DC | PRN
  Filled 2013-05-10: qty 1

## 2013-05-10 MED ORDER — ACETAMINOPHEN 650 MG RE SUPP: 650.0000 mg | Freq: Four times a day (QID) | RECTAL | Status: DC | PRN

## 2013-05-10 MED ORDER — MORPHINE SULFATE 2 MG/ML IJ SOLN: 1.0000 mg | INTRAMUSCULAR | Status: DC | PRN

## 2013-05-10 MED ORDER — SODIUM CHLORIDE 0.9 % IJ SOLN
3.0000 mL | Freq: Two times a day (BID) | INTRAMUSCULAR | Status: DC
  Administered 2013-05-12 – 2013-05-16 (×7): 3 mL via INTRAVENOUS

## 2013-05-10 MED ORDER — ENOXAPARIN SODIUM 40 MG/0.4ML ~~LOC~~ SOLN
40.0000 mg | SUBCUTANEOUS | Status: DC
  Administered 2013-05-10 – 2013-05-16 (×7): 40 mg via SUBCUTANEOUS
  Filled 2013-05-10 (×7): qty 0.4

## 2013-05-10 MED ORDER — ONDANSETRON HCL 4 MG/2ML IJ SOLN: 4.0000 mg | Freq: Four times a day (QID) | INTRAMUSCULAR | Status: DC | PRN

## 2013-05-10 MED ORDER — DOCUSATE SODIUM 100 MG PO CAPS
100.0000 mg | ORAL_CAPSULE | Freq: Two times a day (BID) | ORAL | Status: DC
  Administered 2013-05-10 – 2013-05-16 (×12): 100 mg via ORAL
  Filled 2013-05-10 (×13): qty 1

## 2013-05-10 NOTE — ED Provider Notes (Signed)
Medical screening examination/treatment/procedure(s) were conducted as a shared visit with non-physician practitioner(s) and myself.  I personally evaluated the patient during the encounter The patient presenting with fever, chills, intermittent cough who is well appearing on exam with stable vital signs however appears to have a new pneumonia which hospital acquired. Patient started on vancomycin Zosyn and admitted for further care.   Gwyneth Sprout, MD 05/10/13 [phone]

## 2013-05-10 NOTE — ED Notes (Signed)
Report to sandra

## 2013-05-10 NOTE — H&P (Signed)
[facility] History and Physical  [name] ZDG:[id number] DOB: 20-Feb-1941 DOA: 05/10/2013  Referring physician: Emergency Department PCP: No primary provider on file.  Specialists:   Chief Complaint: Fevers  HPI: [name] is a 72 y.o. female  With a pmh of hypertension who was recently discharged from [facility] one week ago for treatment of a suspected tick borne infection. The patient was discharged home with doxycycline, started on 05/02/13, and reportedly stayed well until the morning of admission, when the patient noted high fevers. She subsequently presented to the ED, where she was found to have a presenting temp of 102.29F, leukocytosis of 21K, and a cxr revealing a LLL pneumonia. The hospitalist service was consulted for possible admission.  Review of Systems:  Abnormal weight loss, fevers, remainder of 10 point review of systems reviewed and are neg  Past Medical History  Diagnosis Date  . Hypertension    History reviewed. No pertinent past surgical history. Social History:  reports that she has never smoked. She does not have any smokeless tobacco history on file. She reports that she does not drink alcohol or use illicit drugs.  where does patient live--home, ALF, SNF? and with whom if at home?  Can patient participate in ADLs?  No Known Allergies  History reviewed. No pertinent family history.  (be sure to complete)  Prior to Admission medications   Medication Sig Start Date End Date Taking? Authorizing Provider  doxycycline (VIBRA-TABS) 100 MG tablet Take 100 mg by mouth 2 (two) times daily. 05/02/13  Yes Historical Provider, MD  febuxostat (ULORIC) 40 MG tablet Take 80 mg by mouth daily.   Yes Historical Provider, MD  metoprolol tartrate (LOPRESSOR) 25 MG tablet Take 25 mg by mouth 2 (two) times daily.   Yes Historical Provider, MD  pantoprazole (PROTONIX) 40 MG tablet Take 40 mg by mouth daily before breakfast.   Yes Historical Provider, MD   Physical Exam: Filed  Vitals:   05/10/13 1212 05/10/13 1451 05/10/13 1602  BP: 133/119 132/74 128/91  Pulse: 102 106 108  Temp: 102.7 F (39.3 C) 100 F (37.8 C) 99.6 F (37.6 C)  TempSrc: Oral Oral Oral  Resp: 18 20 18   SpO2: 97% 98% 100%     General:  Awake, in nad  Eyes: regular, s1, s2  ENT: membranes moist, dentition fair  Neck: trachea midline, neck supple  Cardiovascular: regular, s1, s2  Respiratory: normal resp effort, no wheezing  Abdomen: soft, nondistended, pos BS  Skin: normal skin turgor, no abnormal skin lesions seen  Musculoskeletal: perfused, no clubbing or cyanosis  Psychiatric: mood/affect normal, no auditory/visual hallucinations  Neurologic: cn2-12 grossly intact, strength/sensation intact  Labs on Admission:  Basic Metabolic Panel:  Recent Labs Lab 05/10/13 1230  NA 134*  K 4.0  CL 99  CO2 22  GLUCOSE 86  BUN 11  CREATININE 0.93  CALCIUM 9.1   Liver Function Tests:  Recent Labs Lab 05/10/13 1230  AST 14  ALT 6  ALKPHOS 44  BILITOT 0.6  PROT 7.8  ALBUMIN 2.3*    Recent Labs Lab 05/10/13 1230  LIPASE 23   No results found for this basename: AMMONIA,  in the last 168 hours CBC:  Recent Labs Lab 05/10/13 1335  WBC 21.0*  NEUTROABS 14.3*  HGB 8.7*  HCT 27.2*  MCV 76.2*  PLT 213   Cardiac Enzymes: No results found for this basename: CKTOTAL, CKMB, CKMBINDEX, TROPONINI,  in the last 168 hours  BNP (last 3 results) No results  found for this basename: PROBNP,  in the last 8760 hours CBG: No results found for this basename: GLUCAP,  in the last 168 hours  Radiological Exams on Admission: Dg Chest 2 View  05/10/2013   *RADIOLOGY REPORT*  Clinical Data: Chest pain, fever, and assess  CHEST - 2 VIEW  Comparison: None.  Findings: Low lung volumes with basilar atelectasis.  Patchy airspace opacity in the left lower lobe with a small left effusion compatible with pneumonia.  Mild cardiomegaly with vascular congestion but no superimposed CHF  for edema.  No pneumothorax. Trachea is midline.  Degenerative changes of the spine.  IMPRESSION: Left lower lobe patchy airspace process compatible with pneumonia  Associated mild basilar atelectasis and small effusions   Original Report Authenticated By: Judie Petit. Miles Costain, M.D.    Assessment/Plan Principal Problem:   Sepsis Active Problems:   HTN (hypertension)   HCAP (healthcare-associated pneumonia)   Loss of weight   Sepsis with HCAP: - Pt with new pneumonia one week from recent hospitalization - Will cont on empiric vanc and cefepime to cover HCAP - Sputum and blood cultures pending - UA negative - Admit to med/tele, inpatient - O2 per protocol  HTN: - BP stable and well controlled - Cont home meds for now  Abnormal Weight Loss: - This was apparently addressed while at [facility] - Records were requested and are currently pending - Pt was reportedly set up for an outpt EGD post-[facility] discharge - Encourage PO intake for now - Per family, a barium swallow was normal at [facility] - Consider SLP consult  Recently Suspected Tick Borne Infection: - For now, will complete course of doxy  DVT prophylaxis: - Lovenox subQ  Code Status: Full - Confirmed with family members present (must indicate code status--if unknown or must be presumed, indicate so) Family Communication: Pt and family in room (indicate person spoken with, if applicable, with phone number if by telephone) Disposition Plan: Pending (indicate anticipated LOS)  Time spent:  [name], Scheryl Marten [facility] Pager [phone]  If 7PM-7AM, please contact night-coverage [email or web address] Password [facility] 05/10/2013, 4:22 PM

## 2013-05-10 NOTE — Progress Notes (Signed)
ANTIBIOTIC CONSULT NOTE - INITIAL  Pharmacy Consult for Vancomycin, Cefepime, Doxy Indication: pneumonia x 8 days  No Known Allergies  Patient Measurements:   Adjusted Body Weight:    Vital Signs: Temp: 99.6 F (37.6 C) (07/29 1602) Temp src: Oral (07/29 1602) BP: 128/91 mmHg (07/29 1602) Pulse Rate: 108 (07/29 1602) Intake/Output from previous day:   Intake/Output from this shift:    Labs:  Recent Labs  05/10/13 1230 05/10/13 1335  WBC  --  21.0*  HGB  --  8.7*  PLT  --  213  CREATININE 0.93  --    CrCl is unknown because there is no height on file for the current visit. No results found for this basename: VANCOTROUGH, VANCOPEAK, VANCORANDOM, GENTTROUGH, GENTPEAK, GENTRANDOM, TOBRATROUGH, TOBRAPEAK, TOBRARND, AMIKACINPEAK, AMIKACINTROU, AMIKACIN,  in the last 72 hours   Microbiology: No results found for this or any previous visit (from the past 720 hour(s)).  Medical History: Past Medical History  Diagnosis Date  . Hypertension     Medications:  Prescriptions prior to admission  Medication Sig Dispense Refill  . doxycycline (VIBRA-TABS) 100 MG tablet Take 100 mg by mouth 2 (two) times daily.      . febuxostat (ULORIC) 40 MG tablet Take 80 mg by mouth daily.      . metoprolol tartrate (LOPRESSOR) 25 MG tablet Take 25 mg by mouth 2 (two) times daily.      . pantoprazole (PROTONIX) 40 MG tablet Take 40 mg by mouth daily before breakfast.       Assessment: Fever, cough, N/V  72 y/o female discharged from [facility] 1 week ago for treatment of suspected tick borne infection being treated with Doxycycline. Developed high fevers today. CXR with LLL PNA. Has lost approx 40 lbs over the last couple of months.  Temp 102.8, WBC 21, Scr 0.93 with estimated CrCl 57  Goal of Therapy:  Vancomycin trough level 15-20 mcg/ml  Plan:  Vanco 1g IV given 1421 in ED, then 500mg  IV q12hrs Vanco trough after 3-5 doses at steady state Cefepime 1g IV q 8 hrs dose ok. Continue course  of Doxycycline from [facility] for tick-borne infection   Pasty Spillers 05/10/2013,4:56 PM

## 2013-05-10 NOTE — ED Provider Notes (Signed)
CSN: [id number]     Arrival date & time 05/10/13  1205 History     First MD Initiated Contact with Patient 05/10/13 1244     Chief Complaint  Patient presents with  . Chest Pain  . Fever  . Emesis   (Consider location/radiation/quality/duration/timing/severity/associated sxs/prior Treatment) HPI Comments: Patient presents with complaint of fever began this morning. Patient was recently hospitalized over the past one to 2 weeks at [facility] for fever. She was treated for possible tickborne illness with doxycycline which she is continuing to take. Family notes that patient has lost approximately 40 pounds over the past several months. She has decreased appetite and a feeling of food getting stuck in her throat. She is apparently to have an outpatient EGD performed in the near future. Patient denies headache, cold symptoms including runny nose and sore throat. She at times has a minimal cough described as "clearing her throat". She has had persistent nausea and vomiting. No diarrhea. Patient complains of intermittent chest pain which lasts for several seconds before resolving. Last episode was this morning. She denies any skin changes or rashes. She denies swelling of her lower legs. No history of congestive heart failure. Per daughter, patient had a CT scan with and without contrast of the chest while she was at [facility] which was clear. Onset of symptoms gradual. Course is worsening. Nothing makes symptoms better or worse.  The history is provided by the patient and a relative.    Past Medical History  Diagnosis Date  . Hypertension    History reviewed. No pertinent past surgical history. History reviewed. No pertinent family history. History  Substance Use Topics  . Smoking status: Never Smoker   . Smokeless tobacco: Not on file  . Alcohol Use: No   OB History   Grav Para Term Preterm Abortions TAB SAB Ect Mult Living                 Review of Systems  Constitutional: Positive for fever and  fatigue.  HENT: Negative for sore throat and rhinorrhea.   Eyes: Negative for redness.  Respiratory: Positive for cough.   Cardiovascular: Positive for chest pain. Negative for palpitations and leg swelling.  Gastrointestinal: Positive for nausea and vomiting. Negative for abdominal pain and diarrhea.  Genitourinary: Negative for dysuria, hematuria and vaginal bleeding.  Musculoskeletal: Negative for myalgias and arthralgias.  Skin: Negative for rash.  Neurological: Negative for syncope and headaches.    Allergies  Review of patient's allergies indicates no known allergies.  Home Medications   Current Outpatient Rx  Name  Route  Sig  Dispense  Refill  . doxycycline (VIBRA-TABS) 100 MG tablet   Oral   Take 100 mg by mouth 2 (two) times daily.         . febuxostat (ULORIC) 40 MG tablet   Oral   Take 80 mg by mouth daily.         . metoprolol tartrate (LOPRESSOR) 25 MG tablet   Oral   Take 25 mg by mouth 2 (two) times daily.         . pantoprazole (PROTONIX) 40 MG tablet   Oral   Take 40 mg by mouth daily before breakfast.          BP 133/119  Pulse 102  Temp(Src) 102.7 F (39.3 C) (Oral)  Resp 18  SpO2 97% Physical Exam  Nursing note and vitals reviewed. Constitutional: She appears well-developed and well-nourished.  HENT:  Head: Normocephalic and atraumatic.  Right Ear: External ear normal.  Left Ear: External ear normal.  Nose: Nose normal.  White coating on tongue consistent with thrush  Eyes: Conjunctivae are normal. Pupils are equal, round, and reactive to light. Right eye exhibits no discharge. Left eye exhibits no discharge.  Neck: Normal range of motion. Neck supple.  Cardiovascular: Regular rhythm and normal heart sounds.  Tachycardia present.   No murmur heard. Pulmonary/Chest: Effort normal and breath sounds normal. No respiratory distress. She has no wheezes. She has no rales.  Abdominal: Soft. Bowel sounds are normal. There is no tenderness.  There is no rebound and no guarding.  Musculoskeletal: She exhibits no edema and no tenderness.  Lymphadenopathy:    She has no cervical adenopathy.  Neurological: She is alert.  Skin: Skin is warm and dry.  Psychiatric: She has a normal mood and affect.    ED Course   Procedures (including critical care time)  Labs Reviewed  COMPREHENSIVE METABOLIC PANEL - Abnormal; Notable for the following:    Sodium 134 (*)    Albumin 2.3 (*)    GFR calc non Af Amer 61 (*)    GFR calc Af Amer 71 (*)    All other components within normal limits  CBC WITH DIFFERENTIAL - Abnormal; Notable for the following:    WBC 21.0 (*)    RBC 3.57 (*)    Hemoglobin 8.7 (*)    HCT 27.2 (*)    MCV 76.2 (*)    MCH 24.4 (*)    RDW 19.2 (*)    Lymphocytes Relative 8 (*)    Monocytes Relative 22 (*)    Neutro Abs 14.3 (*)    Monocytes Absolute 4.6 (*)    All other components within normal limits  CG4 I-STAT (LACTIC ACID) - Abnormal; Notable for the following:    Lactic Acid, Venous 2.45 (*)    All other components within normal limits  CULTURE, BLOOD (ROUTINE X 2)  CULTURE, BLOOD (ROUTINE X 2)  LIPASE, BLOOD  CBC WITH DIFFERENTIAL  URINALYSIS, ROUTINE W REFLEX MICROSCOPIC  POCT I-STAT TROPONIN I   Dg Chest 2 View  05/10/2013   *RADIOLOGY REPORT*  Clinical Data: Chest pain, fever, and assess  CHEST - 2 VIEW  Comparison: None.  Findings: Low lung volumes with basilar atelectasis.  Patchy airspace opacity in the left lower lobe with a small left effusion compatible with pneumonia.  Mild cardiomegaly with vascular congestion but no superimposed CHF for edema.  No pneumothorax. Trachea is midline.  Degenerative changes of the spine.  IMPRESSION: Left lower lobe patchy airspace process compatible with pneumonia  Associated mild basilar atelectasis and small effusions   Original Report Authenticated By: Judie Petit. Shick, M.D.   1. Sepsis   2. HCAP (healthcare-associated pneumonia)     1:13 PM Patient seen and  examined. Work-up initiated. Medications ordered.   Vital signs reviewed and are as follows: Filed Vitals:   05/10/13 1212  BP: 133/119  Pulse: 102  Temp: 102.7 F (39.3 C)  Resp: 18   3:08 PM Pt seen by and d/w Dr. Anitra Lauth. I have spoken with [facility] who will see and admit.    MDM  Admit HCAP.   Renne Crigler, PA-C 05/10/13 [phone]

## 2013-05-10 NOTE — ED Notes (Signed)
Pt reports fever, n/v and centralized chest pain that comes and goes.  Pt was at [facility] for several days being treated for same symptoms and for dehydration.   Pt had blood cultures ad several other test while there.  Per grand daughter pt was being treated for unknown tick disease bc [facility] didn't know what els could have caused her symptoms.  Pt spiked a fever again today and has thrush in mouth.  Pt still taking dioxycycline. Pt alert oriented X4.

## 2013-05-10 NOTE — ED Notes (Signed)
Pt c/o fever, CP and N/V x 2 days; pt admitted at [facility] recently for same and treated for possible tick disease

## 2013-05-11 DIAGNOSIS — R1310 Dysphagia, unspecified: Secondary | ICD-10-CM | POA: Diagnosis present

## 2013-05-11 DIAGNOSIS — B370 Candidal stomatitis: Secondary | ICD-10-CM | POA: Diagnosis present

## 2013-05-11 DIAGNOSIS — D509 Iron deficiency anemia, unspecified: Secondary | ICD-10-CM | POA: Diagnosis present

## 2013-05-11 DIAGNOSIS — E43 Unspecified severe protein-calorie malnutrition: Secondary | ICD-10-CM | POA: Insufficient documentation

## 2013-05-11 DIAGNOSIS — J189 Pneumonia, unspecified organism: Secondary | ICD-10-CM

## 2013-05-11 DIAGNOSIS — C9290 Myeloid leukemia, unspecified, not having achieved remission: Secondary | ICD-10-CM

## 2013-05-11 LAB — LEGIONELLA ANTIGEN, URINE

## 2013-05-11 LAB — CBC
Hemoglobin: 7.7 g/dL — ABNORMAL LOW (ref 12.0–15.0)
MCHC: 32.8 g/dL (ref 30.0–36.0)
Platelets: 170 10*3/uL (ref 150–400)

## 2013-05-11 LAB — COMPREHENSIVE METABOLIC PANEL
ALT: <5 U/L (ref 0–35)
CO2: 23 mEq/L (ref 19–32)
Calcium: 8.3 mg/dL — ABNORMAL LOW (ref 8.4–10.5)
Creatinine, Ser: 1.03 mg/dL (ref 0.50–1.10)
GFR calc Af Amer: 62 mL/min — ABNORMAL LOW (ref >90)
GFR calc non Af Amer: 54 mL/min — ABNORMAL LOW (ref >90)
Glucose, Bld: 86 mg/dL (ref 70–99)
Sodium: 134 mEq/L — ABNORMAL LOW (ref 135–145)
Total Bilirubin: 0.6 mg/dL (ref 0.3–1.2)

## 2013-05-11 MED ORDER — BOOST / RESOURCE BREEZE PO LIQD
1.0000 | Freq: Three times a day (TID) | ORAL | Status: DC
  Administered 2013-05-11 – 2013-05-16 (×12): 1 via ORAL
  Filled 2013-05-11 (×2): qty 1

## 2013-05-11 MED ORDER — FLUCONAZOLE 100 MG PO TABS
100.0000 mg | ORAL_TABLET | Freq: Every day | ORAL | Status: DC
  Administered 2013-05-11 – 2013-05-12 (×2): 100 mg via ORAL
  Filled 2013-05-11 (×2): qty 1

## 2013-05-11 MED ORDER — NYSTATIN 100000 UNIT/ML MT SUSP
5.0000 mL | Freq: Four times a day (QID) | OROMUCOSAL | Status: DC
  Administered 2013-05-11 – 2013-05-16 (×21): 500000 [IU] via ORAL
  Filled 2013-05-11 (×23): qty 5

## 2013-05-11 NOTE — Progress Notes (Signed)
INITIAL NUTRITION ASSESSMENT  DOCUMENTATION CODES Per approved criteria  -Severe malnutrition in the context of acute illness or injury   INTERVENTION: 1.  Supplements; Resource Breeze po TID, each supplement provides 250 kcal and 9 grams of protein. 2.  General healthful diet; discussed nutrition needs and encouraged intake.    NUTRITION DIAGNOSIS: Inadequate oral intake related to poor appetite, weakness as evidenced by pt report.   Monitor:  1.  Food/Beverage; pt meeting >/=90% estimated needs with tolerance. 2.  Wt/wt change; monitor trends  Reason for Assessment: MST  72 y.o. female  Admitting Dx: Sepsis  ASSESSMENT: Pt admitted with sepsis and fevers r/t to suspected tick borne illness.  Pt with persistant fevers and poor appetite.  She has also experienced wt loss of 40-50 lbs.   Nutrition Focused Physical Exam:  Subcutaneous Fat:  Orbital Region: moderate Upper Arm Region: wnl Thoracic and Lumbar Region: wnl  Muscle:  Temple Region: moderate Clavicle Bone Region: severe Clavicle and Acromion Bone Region: wnl Scapular Bone Region: wnl Dorsal Hand: moderate Patellar Region: wnl Anterior Thigh Region: wnl Posterior Calf Region: wnl  Edema: none present  Pt states that her intake has been poor for the past 3 months.  She reports inability to eat due to nausea, poor appetite, and early satiety.  Family was bringing meals, however she was unable to eat them.  She skipped several meals and when she did eat, ate very small portions.  Pt meets criteria for severe MALNUTRITION in the context of acute illness as evidenced by 25% wt change and poor intake meeting <50% of estimated needs for <3 months.   Height: Ht Readings from Last 1 Encounters:  05/10/13 5\' 5"  (1.651 m)    Weight: Wt Readings from Last 1 Encounters:  05/10/13 151 lb 14.4 oz (68.9 kg)    Ideal Body Weight: 125 lbs  % Ideal Body Weight: 120%  Wt Readings from Last 10 Encounters:  05/10/13  151 lb 14.4 oz (68.9 kg)    Usual Body Weight: >200 lbs per pt and family  % Usual Body Weight: 75%  BMI:  Body mass index is 25.28 kg/(m^2).  Estimated Nutritional Needs: Kcal: 1930-2200 Protein: 82-96g Fluid: ~2.0 L/day  Skin: generalized edema  Diet Order: General  EDUCATION NEEDS: -Education needs addressed   Intake/Output Summary (Last 24 hours) at 05/11/13 1041 Last data filed at 05/10/13 2251  Gross per 24 hour  Intake 471.25 ml  Output      0 ml  Net 471.25 ml    Last BM: PTA   Labs:   Recent Labs Lab 05/10/13 1230 05/10/13 1831 05/11/13 0500  NA 134*  --  134*  K 4.0  --  3.7  CL 99  --  102  CO2 22  --  23  BUN 11  --  11  CREATININE 0.93 1.06 1.03  CALCIUM 9.1  --  8.3*  GLUCOSE 86  --  86    CBG (last 3)  No results found for this basename: GLUCAP,  in the last 72 hours  Scheduled Meds: . ceFEPime (MAXIPIME) IV  1 g Intravenous Q8H  . docusate sodium  100 mg Oral BID  . doxycycline  100 mg Oral BID  . enoxaparin (LOVENOX) injection  40 mg Subcutaneous Q24H  . febuxostat  80 mg Oral Daily  . metoprolol tartrate  25 mg Oral BID  . pantoprazole  40 mg Oral QAC breakfast  . sodium chloride  3 mL Intravenous Q12H  .  vancomycin  500 mg Intravenous Q12H    Continuous Infusions: . sodium chloride 75 mL/hr (05/10/13 1844)    Past Medical History  Diagnosis Date  . Hypertension     History reviewed. No pertinent past surgical history.  Loyce Dys, MS RD LDN Clinical Inpatient Dietitian Pager: [phone] Weekend/After hours pager: [phone]

## 2013-05-11 NOTE — Progress Notes (Addendum)
[facility] PROGRESS NOTE  [name] WUJ:[id number] DOB: June 03, 1941 DOA: 05/10/2013  PCP: Dr. Ludwig Lean Onc: Dr. Lorre Nick at [place]  Brief HPI: [name] is a 72 y.o. female with a pmh of hypertension who was recently discharged from [facility] one week ago for treatment of a suspected tick borne infection. The patient was discharged home with doxycycline, started on 05/02/13, and reportedly stayed well until the morning of admission, when the patient noted high fevers. She subsequently presented to the ED, where she was found to have a presenting temp of 102.78F, leukocytosis of 21K, and a cxr revealing a LLL pneumonia. The hospitalist service was consulted for possible admission.   Past medical history:  Past Medical History  Diagnosis Date  . Hypertension     Consultants: None  Procedures: None  Antibiotics: Iv Cefepime 7/29--> IV Vanc 7/29--> Oral Doxy 7/21-->  Subjective: Patient is a poor historian. Most history provided by her daughter. She has been having dyspnea for past few months. She has been having dysphagia as well for past one month. Has lost weight. Cough is better. Still with occasional left sided chest pain with deep breaths and cough.  Objective: Vital Signs  Filed Vitals:   05/10/13 2018 05/11/13 0347 05/11/13 1122 05/11/13 1352  BP: 127/67 159/85 138/71 154/79  Pulse: 94 95 107 89  Temp: 98.8 F (37.1 C) 99.7 F (37.6 C)  103 F (39.4 C)  TempSrc: Oral Oral  Oral  Resp: 16 17  18   Height:      Weight:      SpO2: 99% 96%  98%    Intake/Output Summary (Last 24 hours) at 05/11/13 1432 Last data filed at 05/11/13 1300  Gross per 24 hour  Intake 591.25 ml  Output    150 ml  Net 441.25 ml   Filed Weights   05/10/13 1700  Weight: 68.9 kg (151 lb 14.4 oz)   General appearance: alert, cooperative, appears stated age and no distress Head: Normocephalic, without obvious abnormality, atraumatic Throat: whitish plaques noted in back of  mouth Resp: Decreased air entry at left base with crackles. No wheezing. Cardio: regular rate and rhythm, S1, S2 normal, no murmur, click, rub or gallop GI: soft, non-tender; bowel sounds normal; no masses,  no organomegaly Extremities: extremities normal, atraumatic, no cyanosis or edema Pulses: 2+ and symmetric Skin: Skin color, texture, turgor normal. No rashes or lesions Lymph nodes: Cervical, supraclavicular, and axillary nodes normal. Neurologic: Alert. No focal deficits.  Lab Results:  Basic Metabolic Panel:  Recent Labs Lab 05/10/13 1230 05/10/13 1831 05/11/13 0500  NA 134*  --  134*  K 4.0  --  3.7  CL 99  --  102  CO2 22  --  23  GLUCOSE 86  --  86  BUN 11  --  11  CREATININE 0.93 1.06 1.03  CALCIUM 9.1  --  8.3*   Liver Function Tests:  Recent Labs Lab 05/10/13 1230 05/11/13 0500  AST 14 14  ALT 6 <5  ALKPHOS 44 34*  BILITOT 0.6 0.6  PROT 7.8 6.3  ALBUMIN 2.3* 1.9*    Recent Labs Lab 05/10/13 1230  LIPASE 23   CBC:  Recent Labs Lab 05/10/13 1335 05/10/13 1831 05/11/13 0500  WBC 21.0* 23.2* 20.4*  NEUTROABS 14.3*  --   --   HGB 8.7* 7.9* 7.7*  HCT 27.2* 24.6* 23.5*  MCV 76.2* 75.2* 75.3*  PLT 213 171 170     Recent Results (from the  past 240 hour(s))  CULTURE, BLOOD (ROUTINE X 2)     Status: None   Collection Time    05/10/13  2:44 PM      Result Value Range Status   Specimen Description BLOOD FOREARM LEFT   Final   Special Requests     Final   Value: BOTTLES DRAWN AEROBIC AND ANAEROBIC 10CC BLUE 3CC RED   Culture  Setup Time 05/10/2013 23:50   Final   Culture     Final   Value:        BLOOD CULTURE RECEIVED NO GROWTH TO DATE CULTURE WILL BE HELD FOR 5 DAYS BEFORE ISSUING A FINAL NEGATIVE REPORT   Report Status PENDING   Incomplete      Studies/Results: Dg Chest 2 View  05/10/2013   *RADIOLOGY REPORT*  Clinical Data: Chest pain, fever, and assess  CHEST - 2 VIEW  Comparison: None.  Findings: Low lung volumes with basilar  atelectasis.  Patchy airspace opacity in the left lower lobe with a small left effusion compatible with pneumonia.  Mild cardiomegaly with vascular congestion but no superimposed CHF for edema.  No pneumothorax. Trachea is midline.  Degenerative changes of the spine.  IMPRESSION: Left lower lobe patchy airspace process compatible with pneumonia  Associated mild basilar atelectasis and small effusions   Original Report Authenticated By: Judie Petit. Miles Costain, M.D.    Medications:  Scheduled: . ceFEPime (MAXIPIME) IV  1 g Intravenous Q8H  . docusate sodium  100 mg Oral BID  . doxycycline  100 mg Oral BID  . enoxaparin (LOVENOX) injection  40 mg Subcutaneous Q24H  . febuxostat  80 mg Oral Daily  . metoprolol tartrate  25 mg Oral BID  . nystatin  5 mL Oral QID  . pantoprazole  40 mg Oral QAC breakfast  . sodium chloride  3 mL Intravenous Q12H  . vancomycin  500 mg Intravenous Q12H   Continuous: . sodium chloride 75 mL/hr (05/10/13 1844)   YNW:[id number], acetaminophen, morphine injection, ondansetron (ZOFRAN) IV, ondansetron, polyethylene glycol  Assessment/Plan:  Principal Problem:   Sepsis Active Problems:   HTN (hypertension)   HCAP (healthcare-associated pneumonia)   Loss of weight   Myelomonocytic leukemia   Dysphagia, unspecified(787.20)   Oral thrush   Microcytic anemia    Sepsis with HCAP:  Seen on LLL. Continue broad spectrum coverage for now. Continues to have fever. Cultures pending. Recent hospitalization at [facility] for SIRS. She was initially placed on Vanc and Cefepime but then had resolution of fever with Doxycycline. Lyme serology and RMSF serologies were normal. Ehrlichiosis was also negative. Could her cancer be causing fever? May need ID input if fever persists.  Dysphagia Had barium swallow at [facility] last week which was unremarkable. May need GI input eventually. She could have candiasis. Consider starting oral diflucan. SLP input noted.  Oral Thrush Nystatin  History of  Myelomonocytic Leukemia/Weight Loss Followed by Dr. Lorre Nick at [facility]. Counts stable. Continue to monitor. She is on Uloric for elevated uric acid.  Microcytic Anemia Stable hgb. Slightly lower than baseline. Monitor.  SVT at [facility] On beta blocker.  HTN: BP stable and well controlled. Cont home meds for now   Recently Suspected Tick Borne Infection:  Continue Doxycycline for now  DVT prophylaxis:  Lovenox subQ   Code Status: Full Code Family Communication: Discussed with patient and her daughter.  Disposition Plan: Not ready for discharge.     LOS: 1 day   [facility]  [facility] Pager [phone] 05/11/2013, 2:32 PM  If 8PM-8AM, please contact night-coverage at [email or web address], password [facility]

## 2013-05-11 NOTE — Progress Notes (Addendum)
BSE completed, full report to follow.    Pt presents without significant CN dysfunction that would impact swallow ability.  She was observed to cough up viscous white secretions throughout entire session.  Oral holding of boluses noted with whitish coating on tongue.  Pt states she was told she had thrush from her PCP last week but did not receive antifungal.  Lingual movement with protrusion appeared mild extra movement, ? Tremor type movement, fasiculations. Although pt denies changes with speech and physical changes and speech was clear/not dysarthric.   Pt without s/s of aspiration with po observe, minimal bites of cracker, peaches, water, and pills with water from RN.  Pt uses water to facilitate oral transiting of solid food masticated boluses.  She did complain of sensation of stasis in esophagus that cleared independently.  Educated pt to precautions, strategies to mitigate her dysphagia.  Encouraged pt to order extra gravy and sauce to facilitate oral transiting.   SLP reviewed barium swallow from [facility] that showed tertiary contractions consistent with presbyesophagus.  Rec continue diet as pt tolerates, do not suspect primary oropharyngeal deficit.  Suspect oral holding is from pt compensating for dysphagia x several months.  Also question if pt change in taste/smell sensation and possible decreased lingual manipulation abilities could be attributed to lyme disease.   Pt may benefit from further GI testing due to her diagnosis of lyme's disease and persisting dysphagia symptoms if MD indicates.  All education completed with pt, SLP to sign off.    Donavan Burnet, MS [facility] SLP [phone]

## 2013-05-11 NOTE — Evaluation (Signed)
Clinical/Bedside Swallow Evaluation Patient Details  Name: [name] MRN: [id number] Date of Birth: [birth year]  Today's Date: 05/11/2013 Time: 3086-5784 SLP Time Calculation (min): 55 min  Past Medical History:  Past Medical History  Diagnosis Date  . Hypertension    Past Surgical History: History reviewed. No pertinent past surgical history. HPI:  72 yo female adm to [facility] with fevers, cxr indicated left lower lob pna.  Pt recently dc'd from [facility] and was diagnosed with tick bourne illness- lyme disease per pt.  Pt reports 45# weight loss and dysphagia x months with gradual onset.  She consumes Ensure drinks at home and softer foods, she denies problems swallowing pills.  Pt was able to eat a FULL meal of collad greens, ham and corn on Sunday prior to admission but stated she could not eat the same meal the next date.  She also reports she was recently told she had thrush but did not receive treatment for it.  SLP swallow evaluation was ordered.    Assessment / Plan / Recommendation Clinical Impression  Pt presents without significant CN dysfunction that would impact swallow ability. She was observed to cough up viscous white secretions throughout entire session. Oral holding of boluses (suspect compensatory) noted and pt appeared with whitish coating on tongue. Pt states she was told she had thrush from her PCP last week but did not receive antifungal. Lingual movement with protrusion appeared mild extra movement, ? Tremor type movement, fasiculations. Pt denies changes with speech nor physical changes and speech was clear/not dysarthric. Pt without s/s of aspiration with po observe, minimal bites of cracker, peaches, water, and pills with water from RN.   Pt uses water to facilitate oral transiting of solid food masticated boluses. She did complain of sensation of stasis in esophagus that cleared independently. Educated pt to precautions, strategies to mitigate her dysphagia. Encouraged pt  to order extra gravy and sauce and use liquids to facilitate oral transiting.   SLP reviewed barium swallow from [facility] that showed tertiary contractions consistent with presbyesophagus.  Rec continue diet as pt tolerates, do not suspect primary oropharyngeal deficit. Suspect oral holding is from pt compensating for dysphagia x several months. Also question if pt change in taste/smell sensation and possible decreased lingual manipulation abilities could be attributed to lyme disease.   Pt may benefit from further GI testing due to her diagnosis of lyme's disease and persisting dysphagia symptoms if MD indicates. All education completed with pt, SLP to sign off.     Aspiration Risk  Mild    Diet Recommendation Regular;Thin liquid   Liquid Administration via: Cup;Straw Medication Administration: Whole meds with liquid Supervision: Patient able to self feed Compensations: Slow rate;Small sips/bites (use liquids to aid oral transit) Postural Changes and/or Swallow Maneuvers: Upright 30-60 min after meal;Seated upright 90 degrees    Other  Recommendations Oral Care Recommendations: Oral care BID Other Recommendations:  (xerostomia compensations)   Follow Up Recommendations  None    Frequency and Duration   n/a     Pertinent Vitals/Pain Febrile to 102.7 max, clear    SLP Swallow Goals  n/a   Swallow Study Prior Functional Status   see H&P    General HPI: 72 yo female adm to [facility] with fevers, cxr indicated left lower lob pna.  Pt recently dc'd from [facility] and was diagnosed with tick bourne illness- lyme disease per pt.  Pt reports 45# weight loss and dysphagia x months with gradual onset.  She consumes  Ensure drinks at home and softer foods, she denies problems swallowing pills.  Pt was able to eat a FULL meal of collad greens, ham and corn on Sunday prior to admission but stated she could not eat the same meal the next date.  She also reports she was recently told she had thrush but did  not receive treatment for it.  SLP swallow evaluation was ordered.  Previous Swallow Assessment: barium swallow at [facility] showed tertiary contractions consistent with presbyesophagus Diet Prior to this Study: Regular;Thin liquids Temperature Spikes Noted: No Respiratory Status: Room air History of Recent Intubation: No Behavior/Cognition: Alert;Cooperative;Pleasant mood Oral Cavity - Dentition: Dentures, top Self-Feeding Abilities: Able to feed self Patient Positioning: Upright in bed Baseline Vocal Quality: Clear Volitional Cough: Strong Volitional Swallow: Able to elicit    Oral/Motor/Sensory Function Overall Oral Motor/Sensory Function:  (no focal cn deficits-excessive lingual movements upon protr) Labial ROM: Within Functional Limits Labial Symmetry: Within Functional Limits Labial Strength: Within Functional Limits Labial Sensation: Within Functional Limits Lingual ROM: Within Functional Limits Lingual Symmetry: Within Functional Limits Lingual Strength: Within Functional Limits Lingual Sensation: Within Functional Limits Facial ROM: Within Functional Limits Facial Symmetry: Within Functional Limits Facial Strength: Within Functional Limits Facial Sensation: Within Functional Limits Velum: Within Functional Limits Mandible: Within Functional Limits (pt unable to open wide for intake but can at baseline)   Ice Chips Ice chips: Not tested   Thin Liquid Thin Liquid: Within functional limits Presentation: Self Fed;Straw    Nectar Thick Nectar Thick Liquid: Not tested   Honey Thick Honey Thick Liquid: Not tested   Puree Puree: Impaired Presentation: Self Fed Oral Phase Impairments: Reduced lingual movement/coordination;Impaired anterior to posterior transit Oral Phase Functional Implications: Oral holding;Prolonged oral transit Other Comments: pt expectorating applesauce mixed with viscous secretions- did not swallow any applesauce bolus   Solid   GO    Solid:  Impaired Presentation: Self Fed Oral Phase Impairments: Reduced lingual movement/coordination;Impaired anterior to posterior transit Oral Phase Functional Implications:  (anterior oral pocketing, liquids used for transit) Other Comments: pt observed with peaches, saltine crackers, requiring encouragement to eat, pt winces with swallowing and complained of sensation of stasis in esophagus (mid sternum) but not pharynx- clearing independently       Mills Koller, MS [facility] SLP [phone]

## 2013-05-12 DIAGNOSIS — D509 Iron deficiency anemia, unspecified: Secondary | ICD-10-CM

## 2013-05-12 LAB — CBC
MCV: 75.7 fL — ABNORMAL LOW (ref 78.0–100.0)
Platelets: 137 10*3/uL — ABNORMAL LOW (ref 150–400)
RBC: 3 MIL/uL — ABNORMAL LOW (ref 3.87–5.11)
WBC: 22 10*3/uL — ABNORMAL HIGH (ref 4.0–10.5)

## 2013-05-12 LAB — COMPREHENSIVE METABOLIC PANEL
ALT: <5 U/L (ref 0–35)
AST: 7 U/L (ref 0–37)
Albumin: 1.7 g/dL — ABNORMAL LOW (ref 3.5–5.2)
Alkaline Phosphatase: 34 U/L — ABNORMAL LOW (ref 39–117)
CO2: 21 mEq/L (ref 19–32)
Chloride: 105 mEq/L (ref 96–112)
GFR calc non Af Amer: 56 mL/min — ABNORMAL LOW (ref >90)
Potassium: 3.1 mEq/L — ABNORMAL LOW (ref 3.5–5.1)
Sodium: 135 mEq/L (ref 135–145)
Total Bilirubin: 0.4 mg/dL (ref 0.3–1.2)

## 2013-05-12 MED ORDER — POLYVINYL ALCOHOL 1.4 % OP SOLN
1.0000 [drp] | Freq: Four times a day (QID) | OPHTHALMIC | Status: DC | PRN
  Administered 2013-05-14 – 2013-05-16 (×4): 1 [drp] via OPHTHALMIC
  Filled 2013-05-12 (×2): qty 15

## 2013-05-12 MED ORDER — FLUCONAZOLE 200 MG PO TABS
200.0000 mg | ORAL_TABLET | Freq: Every day | ORAL | Status: DC
  Administered 2013-05-13 – 2013-05-16 (×4): 200 mg via ORAL
  Filled 2013-05-12 (×4): qty 1

## 2013-05-12 MED ORDER — FLUCONAZOLE 100 MG PO TABS
100.0000 mg | ORAL_TABLET | Freq: Once | ORAL | Status: AC
  Administered 2013-05-12: 100 mg via ORAL
  Filled 2013-05-12: qty 1

## 2013-05-12 MED ORDER — POTASSIUM CHLORIDE CRYS ER 20 MEQ PO TBCR
40.0000 meq | EXTENDED_RELEASE_TABLET | Freq: Once | ORAL | Status: AC
  Administered 2013-05-12: 40 meq via ORAL
  Filled 2013-05-12: qty 2

## 2013-05-12 NOTE — Progress Notes (Signed)
[facility] PROGRESS NOTE  Delfina Schreurs ZOX:[id number] DOB: Mar 02, 1941 DOA: 05/10/2013  PCP: Dr. Ludwig Lean Onc: Dr. Lorre Nick at [place]  Brief HPI: [name] is a 72 y.o. female with a pmh of hypertension who was recently discharged from [facility] one week ago for treatment of a suspected tick borne infection. The patient was discharged home with doxycycline, started on 05/02/13, and reportedly stayed well until the morning of admission, when the patient noted high fevers. She subsequently presented to the ED, where she was found to have a presenting temp of 102.17F, leukocytosis of 21K, and a cxr revealing a LLL pneumonia. The hospitalist service was consulted for possible admission.   Past medical history:  Past Medical History  Diagnosis Date  . Hypertension     Consultants: None  Procedures:  2D ECHO 7/31 Study Conclusions  - Left ventricle: The cavity size was normal. There was mild concentric hypertrophy. Systolic function was normal. The estimated ejection fraction was in the range of 55% to 60%. Wall motion was normal; there were no regional wall motion abnormalities. Doppler parameters are consistent with abnormal left ventricular relaxation (grade 1 diastolic dysfunction). - Left atrium: The atrium was mildly to moderately dilated.  Antibiotics: Iv Cefepime 7/29--> IV Vanc 7/29--> Oral Doxy 7/21-->  Subjective: Patient slept well overnight. Feels better. No shortness of breath. Able to swallow better.   Objective: Vital Signs  Filed Vitals:   05/11/13 1122 05/11/13 1352 05/11/13 2128 05/12/13 0445  BP: 138/71 154/79 125/60 152/81  Pulse: 107 89 85 88  Temp:  103 F (39.4 C) 98.7 F (37.1 C) 98.8 F (37.1 C)  TempSrc:  Oral Oral Oral  Resp:  18 18 18   Height:      Weight:      SpO2:  98% 99% 99%    Intake/Output Summary (Last 24 hours) at 05/12/13 1303 Last data filed at 05/12/13 0446  Gross per 24 hour  Intake    240 ml  Output    825 ml   Net   -585 ml   Filed Weights   05/10/13 1700  Weight: 68.9 kg (151 lb 14.4 oz)   General appearance: alert, cooperative, appears stated age and no distress Throat: whitish plaques noted in back of mouth Resp: Improved air entry bilaterally. Less crackles. No wheezing. Cardio: regular rate and rhythm, S1, S2 normal, no murmur, click, rub or gallop GI: soft, non-tender; bowel sounds normal; no masses,  no organomegaly Extremities: extremities normal, atraumatic, no cyanosis or edema Neurologic: Alert. No focal deficits.  Lab Results:  Basic Metabolic Panel:  Recent Labs Lab 05/10/13 1230 05/10/13 1831 05/11/13 0500 05/12/13 0630  NA 134*  --  134* 135  K 4.0  --  3.7 3.1*  CL 99  --  102 105  CO2 22  --  23 21  GLUCOSE 86  --  86 83  BUN 11  --  11 11  CREATININE 0.93 1.06 1.03 0.99  CALCIUM 9.1  --  8.3* 8.2*   Liver Function Tests:  Recent Labs Lab 05/10/13 1230 05/11/13 0500 05/12/13 0630  AST 14 14 7   ALT 6 <5 <5  ALKPHOS 44 34* 34*  BILITOT 0.6 0.6 0.4  PROT 7.8 6.3 5.8*  ALBUMIN 2.3* 1.9* 1.7*    Recent Labs Lab 05/10/13 1230  LIPASE 23   CBC:  Recent Labs Lab 05/10/13 1335 05/10/13 1831 05/11/13 0500 05/12/13 0630  WBC 21.0* 23.2* 20.4* 22.0*  NEUTROABS 14.3*  --   --   --  HGB 8.7* 7.9* 7.7* 7.3*  HCT 27.2* 24.6* 23.5* 22.7*  MCV 76.2* 75.2* 75.3* 75.7*  PLT 213 171 170 137*     Recent Results (from the past 240 hour(s))  CULTURE, BLOOD (ROUTINE X 2)     Status: None   Collection Time    05/10/13  2:44 PM      Result Value Range Status   Specimen Description BLOOD FOREARM LEFT   Final   Special Requests     Final   Value: BOTTLES DRAWN AEROBIC AND ANAEROBIC 10CC BLUE 3CC RED   Culture  Setup Time 05/10/2013 23:50   Final   Culture     Final   Value:        BLOOD CULTURE RECEIVED NO GROWTH TO DATE CULTURE WILL BE HELD FOR 5 DAYS BEFORE ISSUING A FINAL NEGATIVE REPORT   Report Status PENDING   Incomplete       Studies/Results: No results found.  Medications:  Scheduled: . ceFEPime (MAXIPIME) IV  1 g Intravenous Q8H  . docusate sodium  100 mg Oral BID  . doxycycline  100 mg Oral BID  . enoxaparin (LOVENOX) injection  40 mg Subcutaneous Q24H  . febuxostat  80 mg Oral Daily  . feeding supplement  1 Container Oral TID BM  . fluconazole  100 mg Oral Daily  . metoprolol tartrate  25 mg Oral BID  . nystatin  5 mL Oral QID  . pantoprazole  40 mg Oral QAC breakfast  . potassium chloride  40 mEq Oral Once  . sodium chloride  3 mL Intravenous Q12H  . vancomycin  500 mg Intravenous Q12H   Continuous:   WUJ:[id number], acetaminophen, morphine injection, ondansetron (ZOFRAN) IV, ondansetron, polyethylene glycol  Assessment/Plan:  Principal Problem:   Sepsis Active Problems:   HTN (hypertension)   HCAP (healthcare-associated pneumonia)   Loss of weight   Myelomonocytic leukemia   Dysphagia, unspecified(787.20)   Oral thrush   Microcytic anemia   Protein-calorie malnutrition, severe    Sepsis with HCAP Seems to be better. No fever since yesterday. Continue broad spectrum coverage for now. Cultures pending. Recent hospitalization at [facility] for SIRS. She was initially placed on Vanc and Cefepime but then had resolution of fever with Doxycycline. Lyme serology and RMSF serologies were normal. Ehrlichiosis was also negative. Could her cancer be contributing as well? May need ID input if fever persists.  Dysphagia Had barium swallow at [facility] last week which was unremarkable. She could have candidiasis as she does have oral thrush. She was started on oral diflucan. Will increase dose to 200mg  daily. SLP input noted. May need GI input eventually but since she is improving we can hold off for now.  Oral Thrush Nystatin  History of Myelomonocytic Leukemia/Weight Loss Followed by Dr. Lorre Nick at [facility]. Counts stable. Continue to monitor. She is on Uloric for elevated uric acid.  Microcytic  Anemia Slightly lower than baseline. Hgb has dropped some probably from dilution. Since she has been feeling weak I offered blood transfusion but she wants to think about it.  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 ECHO report reviewed. No change in management for now.  HTN: BP stable and well controlled. Cont home meds for now   Recently Suspected Tick Borne Infection:  Continue Doxycycline for now  DVT prophylaxis:  Lovenox subQ   Code Status: Full Code Family Communication: Discussed with patient.  Disposition Plan: Not ready for discharge.     LOS: 2 days   [facility]  [facility] Pager [phone] 05/12/2013, 1:03 PM  If 8PM-8AM, please contact night-coverage at [email or web address], password [facility]

## 2013-05-12 NOTE — Progress Notes (Signed)
Echo Lab  2D Echocardiogram completed.  Claudy Abdallah L Shalae Belmonte, RDCS 05/12/2013 11:12 AM

## 2013-05-13 ENCOUNTER — Ambulatory Visit: Payer: Self-pay | Admitting: Internal Medicine

## 2013-05-13 ENCOUNTER — Inpatient Hospital Stay (HOSPITAL_COMMUNITY): Payer: Medicare Other

## 2013-05-13 LAB — BASIC METABOLIC PANEL
BUN: 9 mg/dL (ref 6–23)
CO2: 21 mEq/L (ref 19–32)
Calcium: 8.3 mg/dL — ABNORMAL LOW (ref 8.4–10.5)
GFR calc non Af Amer: 54 mL/min — ABNORMAL LOW (ref >90)
Glucose, Bld: 82 mg/dL (ref 70–99)

## 2013-05-13 LAB — CBC
Hemoglobin: 7.3 g/dL — ABNORMAL LOW (ref 12.0–15.0)
MCH: 24.4 pg — ABNORMAL LOW (ref 26.0–34.0)
MCHC: 32.4 g/dL (ref 30.0–36.0)
RDW: 19 % — ABNORMAL HIGH (ref 11.5–15.5)

## 2013-05-13 LAB — ABO/RH: ABO/RH(D): O POS

## 2013-05-13 MED ORDER — LEVOFLOXACIN IN D5W 500 MG/100ML IV SOLN
500.0000 mg | INTRAVENOUS | Status: DC
  Administered 2013-05-13 – 2013-05-14 (×2): 500 mg via INTRAVENOUS
  Filled 2013-05-13 (×3): qty 100

## 2013-05-13 MED ORDER — POTASSIUM CHLORIDE 10 MEQ/100ML IV SOLN
10.0000 meq | INTRAVENOUS | Status: AC
  Administered 2013-05-13 (×2): 10 meq via INTRAVENOUS
  Filled 2013-05-13 (×4): qty 100

## 2013-05-13 MED ORDER — POTASSIUM CHLORIDE 10 MEQ/100ML IV SOLN
10.0000 meq | INTRAVENOUS | Status: AC
  Administered 2013-05-13 (×2): 10 meq via INTRAVENOUS
  Filled 2013-05-13 (×2): qty 100

## 2013-05-13 NOTE — Progress Notes (Signed)
NUTRITION FOLLOW UP  DOCUMENTATION CODES  Per approved criteria   -Severe malnutrition in the context of acute illness or injury    Intervention:   1.  Supplements; Continue Resource Breeze TID.  Provided pt with coupons for home use.  Recommended Ensure Clear. 2.  Continue to encourage intake as able.  Also encourage pt to consume foods with medication when appropriate to prevent stomach upset.   Nutrition Dx:   Inadequate oral intake, ongoing.   Monitor:   1.  Food/Beverage; pt meeting >/=90% estimated needs with tolerance.  Met, pt with significantly improved intake 2.  Wt/wt change; monitor trends.  Ongoing.    Assessment:   Pt amitted with sepsis and fevers.  She reported poor appetite and 40-50 lbs wt loss. Pt reports her appetite and intake have improved.  She did have some vomiting overnight due to upset stomach which may have been medication related. Discussed nutrition at home with pt and daughter.   Height: Ht Readings from Last 1 Encounters:  05/10/13 5\' 5"  (1.651 m)    Weight Status:   Wt Readings from Last 1 Encounters:  05/10/13 151 lb 14.4 oz (68.9 kg)    Re-estimated needs:  Kcal: 1930-2200 Protein: 82-96g Fluid: ~2.0 L/day  Skin: generalized edema  Diet Order: General   Intake/Output Summary (Last 24 hours) at 05/13/13 0954 Last data filed at 05/12/13 1729  Gross per 24 hour  Intake    480 ml  Output    200 ml  Net    280 ml    Last BM: 7/30   Labs:   Recent Labs Lab 05/11/13 0500 05/12/13 0630 05/13/13 0522  NA 134* 135 137  K 3.7 3.1* 3.4*  CL 102 105 107  CO2 23 21 21   BUN 11 11 9   CREATININE 1.03 0.99 1.03  CALCIUM 8.3* 8.2* 8.3*  MG  --   --  1.4*  GLUCOSE 86 83 82    CBG (last 3)  No results found for this basename: GLUCAP,  in the last 72 hours  Scheduled Meds: . ceFEPime (MAXIPIME) IV  1 g Intravenous Q8H  . docusate sodium  100 mg Oral BID  . doxycycline  100 mg Oral BID  . enoxaparin (LOVENOX) injection  40 mg  Subcutaneous Q24H  . febuxostat  80 mg Oral Daily  . feeding supplement  1 Container Oral TID BM  . fluconazole  200 mg Oral Daily  . metoprolol tartrate  25 mg Oral BID  . nystatin  5 mL Oral QID  . pantoprazole  40 mg Oral QAC breakfast  . sodium chloride  3 mL Intravenous Q12H  . vancomycin  500 mg Intravenous Q12H    Continuous Infusions:   Loyce Dys, MS RD LDN Clinical Inpatient Dietitian Pager: [phone] Weekend/After hours pager: [phone]

## 2013-05-13 NOTE — Care Management Note (Signed)
    Page 1 of 2   05/16/2013     5:02:05 PM   CARE MANAGEMENT NOTE 05/16/2013  Patient:  [name]   Account Number:  [id number]  Date Initiated:  05/13/2013  Documentation initiated by:  Carmelia Tiner  Subjective/Objective Assessment:   PT ADM ON 05/10/13 WITH SEPSIS, PNA.  PTA, PT FAIRLY INDEPENDENT, LIVES ALONE, BUT HAS SUPPORTIVE DAUGHTER WHO CHECKS ON HER.     Action/Plan:   PT ACTIVE WITH [facility] FOR HHRN, HHPT, AND HHOT.  WILL NEED RESUMPTION OF CARE ORDERS PRIOR TO DC.   Anticipated DC Date:  05/16/2013   Anticipated DC Plan:  HOME W HOME HEALTH SERVICES      DC Planning Services  CM consult      [facility] Choice  HOME HEALTH  Resumption Of Svcs/PTA Provider   Choice offered to / List presented to:  C-1 Patient        HH arranged  HH-1 RN  HH-2 PT  HH-3 OT      [name] agency  OTHER - SEE NOTE   Status of service:  Completed, signed off Medicare Important Message given?   (If response is "NO", the following Medicare IM given date fields will be blank) Date Medicare IM given:   Date Additional Medicare IM given:    Discharge Disposition:  HOME W HOME HEALTH SERVICES  Per UR Regulation:  Reviewed for med. necessity/level of care/duration of stay  If discussed at Long Length of Stay Meetings, dates discussed:    Comments:  05/16/13 Del Overfelt,RN,BSN [phone] PT FOR DC HOME WITH DAUGHTER AND SON IN LAW TODAY.  TO RESUME HH SERVICES AT DC.  FAXED ORDERS AND DC SUMMARY TO ANGELA AND VALERIE AT [phone].  START OF CARE 24-48H POST DC DATE.  05/12/13 Ellason Segar,RN,BSN [phone] RECEIVED CALL FROM Vikki Ports, CASE MANAGER FROM [facility].  PT ACTIVE WITH THIS AGENCY FOR HOME CARE.  PLEASE REORDER HH CARE PRIOR TO DC AND FAX ORDERS AND DC SUMMARY TO VALERIE AT [phone].  THANKS.

## 2013-05-13 NOTE — Progress Notes (Signed)
[facility] PROGRESS NOTE  Jonia Oakey JYN:[id number] DOB: May 22, 1941 DOA: 05/10/2013  PCP: Dr. Ludwig Lean Onc: Dr. Lorre Nick at [place]  Brief HPI: Jeniyah Menor is a 72 y.o. female with a pmh of hypertension who was recently discharged from [facility] one week ago for treatment of a suspected tick borne infection. The patient was discharged home with doxycycline, started on 05/02/13, and reportedly stayed well until the morning of admission, when the patient noted high fevers. She subsequently presented to the ED, where she was found to have a presenting temp of 102.92F, leukocytosis of 21K, and a cxr revealing a LLL pneumonia.    Past medical history:  Past Medical History  Diagnosis Date  . Hypertension     Consultants: None  Procedures:  2D ECHO 7/31 Study Conclusions  - Left ventricle: The cavity size was normal. There was mild concentric hypertrophy. Systolic function was normal. The estimated ejection fraction was in the range of 55% to 60%. Wall motion was normal; there were no regional wall motion abnormalities. Doppler parameters are consistent with abnormal left ventricular relaxation (grade 1 diastolic dysfunction). - Left atrium: The atrium was mildly to moderately dilated.  Antibiotics: Iv Cefepime 7/29-->8/1 IV Vanc 7/29-->8/1 Oral Doxy 7/21-->   Subjective: Patient feels well. Per daughter she has been having episodes of vomiting after having her meals. Denies any abdominal pain. Swallowing is better.  Objective: Vital Signs  Filed Vitals:   05/12/13 2208 05/13/13 0412 05/13/13 0916 05/13/13 1017  BP: 142/82 131/76  143/82  Pulse: 112 86  91  Temp: 100.6 F (38.1 C) 98.7 F (37.1 C)    TempSrc: Oral Oral    Resp: 18 18    Height:      Weight:      SpO2: 99% 100% 94%     Intake/Output Summary (Last 24 hours) at 05/13/13 1203 Last data filed at 05/12/13 1729  Gross per 24 hour  Intake    480 ml  Output    200 ml  Net    280 ml   Filed  Weights   05/10/13 1700  Weight: 68.9 kg (151 lb 14.4 oz)   General appearance: alert, cooperative, appears stated age and no distress Throat: whitish plaques noted in back of mouth Resp: Improved air entry bilaterally. Less crackles. No wheezing. Cardio: regular rate and rhythm, S1, S2 normal, no murmur, click, rub or gallop GI: soft, minimal tenderness in RUQ without murphy's sign. bowel sounds normal; no masses,  no organomegaly Extremities: 1+ edema noted. No erythema. No calf tenderness. Neurologic: Alert. No focal deficits.  Lab Results:  Basic Metabolic Panel:  Recent Labs Lab 05/10/13 1230 05/10/13 1831 05/11/13 0500 05/12/13 0630 05/13/13 0522  NA 134*  --  134* 135 137  K 4.0  --  3.7 3.1* 3.4*  CL 99  --  102 105 107  CO2 22  --  23 21 21   GLUCOSE 86  --  86 83 82  BUN 11  --  11 11 9   CREATININE 0.93 1.06 1.03 0.99 1.03  CALCIUM 9.1  --  8.3* 8.2* 8.3*  MG  --   --   --   --  1.4*   Liver Function Tests:  Recent Labs Lab 05/10/13 1230 05/11/13 0500 05/12/13 0630  AST 14 14 7   ALT 6 <5 <5  ALKPHOS 44 34* 34*  BILITOT 0.6 0.6 0.4  PROT 7.8 6.3 5.8*  ALBUMIN 2.3* 1.9* 1.7*    Recent Labs Lab  05/10/13 1230  LIPASE 23   CBC:  Recent Labs Lab 05/10/13 1335 05/10/13 1831 05/11/13 0500 05/12/13 0630 05/13/13 0522  WBC 21.0* 23.2* 20.4* 22.0* 21.1*  NEUTROABS 14.3*  --   --   --   --   HGB 8.7* 7.9* 7.7* 7.3* 7.3*  HCT 27.2* 24.6* 23.5* 22.7* 22.5*  MCV 76.2* 75.2* 75.3* 75.7* 75.3*  PLT 213 171 170 137* 156     Recent Results (from the past 240 hour(s))  CULTURE, BLOOD (ROUTINE X 2)     Status: None   Collection Time    05/10/13  2:44 PM      Result Value Range Status   Specimen Description BLOOD FOREARM LEFT   Final   Special Requests     Final   Value: BOTTLES DRAWN AEROBIC AND ANAEROBIC 10CC BLUE 3CC RED   Culture  Setup Time 05/10/2013 23:50   Final   Culture     Final   Value:        BLOOD CULTURE RECEIVED NO GROWTH TO DATE  CULTURE WILL BE HELD FOR 5 DAYS BEFORE ISSUING A FINAL NEGATIVE REPORT   Report Status PENDING   Incomplete      Studies/Results: No results found.  Medications:  Scheduled: . ceFEPime (MAXIPIME) IV  1 g Intravenous Q8H  . docusate sodium  100 mg Oral BID  . enoxaparin (LOVENOX) injection  40 mg Subcutaneous Q24H  . febuxostat  80 mg Oral Daily  . feeding supplement  1 Container Oral TID BM  . fluconazole  200 mg Oral Daily  . metoprolol tartrate  25 mg Oral BID  . nystatin  5 mL Oral QID  . pantoprazole  40 mg Oral QAC breakfast  . sodium chloride  3 mL Intravenous Q12H  . vancomycin  500 mg Intravenous Q12H   Continuous:   NWG:[id number], acetaminophen, morphine injection, ondansetron (ZOFRAN) IV, ondansetron, polyethylene glycol, polyvinyl alcohol  Assessment/Plan:  Principal Problem:   Sepsis Active Problems:   HTN (hypertension)   HCAP (healthcare-associated pneumonia)   Loss of weight   Myelomonocytic leukemia   Dysphagia, unspecified(787.20)   Oral thrush   Microcytic anemia   Protein-calorie malnutrition, severe    Sepsis with HCAP Seems to be improving. No fever since 7/30. Blood cultures show no growth. Will change to oral Levaquin. She was recently hospitalized at [facility] for SIRS. She was initially placed on Vanc and Cefepime but then had resolution of fever with Doxycycline. Lyme serology and RMSF serologies were normal. Ehrlichiosis was also negative.   Dysphagia Had barium swallow at [facility] last week which was unremarkable. She could have candidiasis as she does have oral thrush. She was started on oral diflucan. Will increase dose to 200mg  daily. SLP input noted. She seems to be improving.  Post Prandial Vomiting She has minimal RUQ tenderness. Will get US abdomen to evaluate. Then will consider GI input.   Oral Thrush Nystatin  Pedal edema Get LE dopplers considering recent hospital stay.  History of Myelomonocytic Leukemia/Weight Loss Followed  by Dr. Lorre Nick at [facility]. Counts stable. Continue to monitor. She is on Uloric for elevated uric acid.  Microcytic Anemia Slightly lower than baseline. Hgb has dropped some probably from dilution. She is agreeable to blood transfusion for her weakness.   SVT at [facility] On beta blocker. ECHO report reviewed. No change in management for now.  HTN BP stable and well controlled. Cont home meds for now   Recently Suspected Tick Borne Infection:  Continue  Doxycycline for now  DVT prophylaxis  Lovenox subQ   Code Status: Full Code Family Communication: Discussed with patient and her daughter.  Disposition Plan: Not ready for discharge.     LOS: 3 days   [facility]  [facility] Pager [phone] 05/13/2013, 12:03 PM  If 8PM-8AM, please contact night-coverage at [email or web address], password [facility]

## 2013-05-13 NOTE — Progress Notes (Signed)
OT NOTE  Pt seen for initial eval.  Full report to follow. Daughter is concerned regarding pt's safety at D/C. States she does not feel comfortable with her mother being at home alone while she is at work. Will see pt again in am and further assess appropriate D/C plan.  [facility], OTR/L  [phone] 05/13/2013

## 2013-05-13 NOTE — Progress Notes (Signed)
Utilization review completed. Shameka Aggarwal, RN, BSN. 

## 2013-05-13 NOTE — Evaluation (Signed)
Physical Therapy Evaluation Patient Details Name: [name] MRN: [id number] DOB: 25-Dec-1940 Today's Date: 05/13/2013 Time: 7829-5621 PT Time Calculation (min): 22 min  PT Assessment / Plan / Recommendation History of Present Illness  With a pmh of hypertension who was recently discharged from [facility] one week ago for treatment of a suspected tick borne infection. The patient was discharged home with doxycycline, started on 05/02/13, and reportedly stayed well until the morning of admission, when the patient noted high fevers. She subsequently presented to the ED, where she was found to have a presenting temp of 102.79F, leukocytosis of 21K, and a cxr revealing a LLL pneumonia  Clinical Impression  Pt very positive and eager to return home. Pt reports SOB with gait dyspnea 1/4 with sats 94-98% throughout on RA. Pt with decreased activity tolerance and gait from baseline and will benefit from acute therapy to maximize independence for safe return home. Recommend daily ambulation with nursing staff.     PT Assessment  Patient needs continued PT services    Follow Up Recommendations  No PT follow up    Does the patient have the potential to tolerate intense rehabilitation      Barriers to Discharge Decreased caregiver support      Equipment Recommendations  None recommended by PT    Recommendations for Other Services     Frequency Min 3X/week    Precautions / Restrictions Precautions Precautions: Fall   Pertinent Vitals/Pain No pain     Mobility  Bed Mobility Bed Mobility: Supine to Sit Supine to Sit: 6: Modified independent (Device/Increase time);HOB flat Transfers Transfers: Sit to Stand;Stand to Sit Sit to Stand: 6: Modified independent (Device/Increase time);From bed Stand to Sit: 6: Modified independent (Device/Increase time);To chair/3-in-1 Ambulation/Gait Ambulation/Gait Assistance: 5: Supervision Ambulation Distance (Feet): 400 Feet Assistive device: Rolling  walker Ambulation/Gait Assistance Details: cueing for posture and position in RW Gait Pattern: Step-through pattern;Decreased stride length Gait velocity: decreased Stairs: Yes Stairs Assistance: 6: Modified independent (Device/Increase time) Stair Management Technique: Backwards;With walker Number of Stairs: 1    Exercises     PT Diagnosis: Difficulty walking  PT Problem List: Decreased activity tolerance;Decreased knowledge of use of DME PT Treatment Interventions: DME instruction;Gait training;Functional mobility training;Therapeutic activities;Patient/family education;Therapeutic exercise     PT Goals(Current goals can be found in the care plan section) Acute Rehab PT Goals Patient Stated Goal: go home PT Goal Formulation: With patient Time For Goal Achievement: 05/20/13 Potential to Achieve Goals: Good  Visit Information  Last PT Received On: 05/13/13 Assistance Needed: +1 History of Present Illness: With a pmh of hypertension who was recently discharged from [facility] one week ago for treatment of a suspected tick borne infection. The patient was discharged home with doxycycline, started on 05/02/13, and reportedly stayed well until the morning of admission, when the patient noted high fevers. She subsequently presented to the ED, where she was found to have a presenting temp of 102.79F, leukocytosis of 21K, and a cxr revealing a LLL pneumonia       Prior Functioning  Home Living Family/patient expects to be discharged to:: Private residence Living Arrangements: Alone Available Help at Discharge: Family;Available PRN/intermittently Type of Home: House Home Access: Stairs to enter Entrance Stairs-Number of Steps: 1 Home Layout: One level Home Equipment: Walker - 2 wheels;Cane - single point;Bedside commode;Shower seat Prior Function Level of Independence: Independent with assistive device(s) Comments: Pt has been using cane at home. No report of falls and requested use of RW  acutely Communication Communication:  No difficulties    Cognition  Cognition Arousal/Alertness: Awake/alert Behavior During Therapy: WFL for tasks assessed/performed Overall Cognitive Status: Within Functional Limits for tasks assessed    Extremity/Trunk Assessment Upper Extremity Assessment Upper Extremity Assessment: Defer to OT evaluation Lower Extremity Assessment Lower Extremity Assessment: Generalized weakness Cervical / Trunk Assessment Cervical / Trunk Assessment: Normal   Balance    End of Session PT - End of Session Equipment Utilized During Treatment: Gait belt Activity Tolerance: Patient tolerated treatment well Patient left: in chair;with call bell/phone within reach;with family/visitor present Nurse Communication: Mobility status  GP     Delorse Lek 05/13/2013, 9:20 AM Delaney Meigs, PT [phone]

## 2013-05-14 DIAGNOSIS — R609 Edema, unspecified: Secondary | ICD-10-CM

## 2013-05-14 DIAGNOSIS — R634 Abnormal weight loss: Secondary | ICD-10-CM

## 2013-05-14 LAB — CBC
MCH: 24.7 pg — ABNORMAL LOW (ref 26.0–34.0)
MCV: 77.5 fL — ABNORMAL LOW (ref 78.0–100.0)
Platelets: 152 10*3/uL (ref 150–400)
RDW: 19 % — ABNORMAL HIGH (ref 11.5–15.5)
WBC: 18.5 10*3/uL — ABNORMAL HIGH (ref 4.0–10.5)

## 2013-05-14 LAB — TYPE AND SCREEN
ABO/RH(D): O POS
Antibody Screen: NEGATIVE
Unit division: 0

## 2013-05-14 LAB — COMPREHENSIVE METABOLIC PANEL
ALT: <5 U/L (ref 0–35)
AST: 9 U/L (ref 0–37)
Albumin: 1.8 g/dL — ABNORMAL LOW (ref 3.5–5.2)
Calcium: 8.5 mg/dL (ref 8.4–10.5)
Creatinine, Ser: 0.99 mg/dL (ref 0.50–1.10)
Sodium: 133 mEq/L — ABNORMAL LOW (ref 135–145)

## 2013-05-14 NOTE — Consult Note (Signed)
[facility] CONSULT Reason for consult: Weight Loss and Anorexia Referring Physician: [facility]  [name] is an 72 y.o. female.  HPI: patient was admitted recently with a fever. She was hospitalized about 2 weeks ago in [place] and was ultimately diagnosed with a tickborne illness and was sent home on tetracycline. According to the records she was diagnosed as SIRS possibly due to Lyme disease. She had extensive evaluations in the hospital that included chest x-rays, CT angiogram of the chest, CT of the sinus and neck, barium swallow whole done to evaluate her fever and her inability to eat. Apparently all these were okay. The patient reports that she has been unable to eat for 2 to 3 months. Apparently in [facility] she complained of dysphagia and had the negative barium swallow but here she denies dysphagia. She stated that even the sight of food maker nauseated however she never really vomit. She denies abdominal pain, change in bowel movements etc. She states that she feels better since being admitted her to [facility] hospital in their appetite is improving.she had speech pathology evaluation hereit was not felt that she had primary or of pharyngeal dysphagia and was given diet as tolerated. She was seen to have some oral thrushit was started in Diflucan. She currently denies any problems eating, pain with swallowing, chest pain or abdominal pain.ultrasound showed some slight sludge in her gallbladder.  Past Medical History  Diagnosis Date  . Hypertension     History reviewed. No pertinent past surgical history.  History reviewed. No pertinent family history.  Social History:  reports that she has never smoked. She does not have any smokeless tobacco history on file. She reports that she does not drink alcohol or use illicit drugs.  Allergies: No Known Allergies  Medications; . docusate sodium  100 mg Oral BID  . enoxaparin (LOVENOX) injection  40 mg  Subcutaneous Q24H  . febuxostat  80 mg Oral Daily  . feeding supplement  1 Container Oral TID BM  . fluconazole  200 mg Oral Daily  . levofloxacin (LEVAQUIN) IV  500 mg Intravenous Q24H  . metoprolol tartrate  25 mg Oral BID  . nystatin  5 mL Oral QID  . pantoprazole  40 mg Oral QAC breakfast  . sodium chloride  3 mL Intravenous Q12H   PRN Meds acetaminophen, acetaminophen, morphine injection, ondansetron (ZOFRAN) IV, ondansetron, polyethylene glycol, polyvinyl alcohol Results for orders placed during the hospital encounter of 05/10/13 (from the past 48 hour(s))  BASIC METABOLIC PANEL     Status: Abnormal   Collection Time    05/13/13  5:22 AM      Result Value Range   Sodium 137  135 - 145 mEq/L   Potassium 3.4 (*) 3.5 - 5.1 mEq/L   Chloride 107  96 - 112 mEq/L   CO2 21  19 - 32 mEq/L   Glucose, Bld 82  70 - 99 mg/dL   BUN 9  6 - 23 mg/dL   Creatinine, Ser 1.61  0.50 - 1.10 mg/dL   Calcium 8.3 (*) 8.4 - 10.5 mg/dL   GFR calc non Af Amer 54 (*) >90 mL/min   GFR calc Af Amer 62 (*) >90 mL/min   Comment:            The eGFR has been calculated     using the CKD EPI equation.     This calculation has not been     validated in all clinical  situations.     eGFR's persistently     <90 mL/min signify     possible Chronic Kidney Disease.  MAGNESIUM     Status: Abnormal   Collection Time    05/13/13  5:22 AM      Result Value Range   Magnesium 1.4 (*) 1.5 - 2.5 mg/dL  CBC     Status: Abnormal   Collection Time    05/13/13  5:22 AM      Result Value Range   WBC 21.1 (*) 4.0 - 10.5 K/uL   RBC 2.99 (*) 3.87 - 5.11 MIL/uL   Hemoglobin 7.3 (*) 12.0 - 15.0 g/dL   HCT 45.4 (*) 09.8 - 11.9 %   MCV 75.3 (*) 78.0 - 100.0 fL   MCH 24.4 (*) 26.0 - 34.0 pg   MCHC 32.4  30.0 - 36.0 g/dL   RDW 14.7 (*) 82.9 - 56.2 %   Platelets 156  150 - 400 K/uL  TYPE AND SCREEN     Status: None   Collection Time    05/13/13 12:45 PM      Result Value Range   ABO/RH(D) O POS     Antibody  Screen NEG     Sample Expiration 05/16/2013     Unit Number [id number]     Blood Component Type RBC LR PHER1     Unit division 00     Status of Unit ISSUED     Transfusion Status OK TO TRANSFUSE     Crossmatch Result Compatible    ABO/RH     Status: None   Collection Time    05/13/13 12:45 PM      Result Value Range   ABO/RH(D) O POS    PREPARE RBC (CROSSMATCH)     Status: None   Collection Time    05/13/13 12:45 PM      Result Value Range   Order Confirmation ORDER PROCESSED BY BLOOD BANK    CBC     Status: Abnormal   Collection Time    05/14/13  3:55 AM      Result Value Range   WBC 18.5 (*) 4.0 - 10.5 K/uL   RBC 3.56 (*) 3.87 - 5.11 MIL/uL   Hemoglobin 8.8 (*) 12.0 - 15.0 g/dL   Comment: DELTA CHECK NOTED     POST TRANSFUSION SPECIMEN   HCT 27.6 (*) 36.0 - 46.0 %   MCV 77.5 (*) 78.0 - 100.0 fL   MCH 24.7 (*) 26.0 - 34.0 pg   MCHC 31.9  30.0 - 36.0 g/dL   RDW 95.2 (*) 84.1 - 32.4 %   Platelets 152  150 - 400 K/uL  COMPREHENSIVE METABOLIC PANEL     Status: Abnormal   Collection Time    05/14/13  3:55 AM      Result Value Range   Sodium 133 (*) 135 - 145 mEq/L   Potassium 3.6  3.5 - 5.1 mEq/L   Chloride 103  96 - 112 mEq/L   CO2 21  19 - 32 mEq/L   Glucose, Bld 78  70 - 99 mg/dL   BUN 8  6 - 23 mg/dL   Creatinine, Ser 4.01  0.50 - 1.10 mg/dL   Calcium 8.5  8.4 - 02.7 mg/dL   Total Protein 6.3  6.0 - 8.3 g/dL   Albumin 1.8 (*) 3.5 - 5.2 g/dL   AST 9  0 - 37 U/L   ALT <5  0 - 35 U/L   Alkaline  Phosphatase 36 (*) 39 - 117 U/L   Total Bilirubin 0.3  0.3 - 1.2 mg/dL   GFR calc non Af Amer 56 (*) >90 mL/min   GFR calc Af Amer 65 (*) >90 mL/min   Comment:            The eGFR has been calculated     using the CKD EPI equation.     This calculation has not been     validated in all clinical     situations.     eGFR's persistently     <90 mL/min signify     possible Chronic Kidney Disease.    US Abdomen Complete  05/13/2013   *RADIOLOGY REPORT*  Clinical Data:   Nausea/vomiting  COMPLETE ABDOMINAL ULTRASOUND  Comparison:  None.  Findings:  Gallbladder:  Suspected layering gallbladder sludge.  No gallbladder wall thickening or cholecystic fluid.  Negative sonographic Murphy's sign.  Common bile duct:  Measures 4 mm.  Liver:  No focal lesion identified.  At the upper limits of normal for parenchymal echogenicity.  IVC:  Appears normal.  Pancreas:  Incompletely visualized but grossly unremarkable.  Spleen:  Measures 4.7 cm.  Right Kidney:  Measures 10.5 cm.  1.6 x 1.4 x 2.2 cm lower pole cyst.  No hydronephrosis.  Left Kidney:  Measures 8.3 cm.  Poorly visualized.  1.8 x 1.4 x 2.0 cm lower pole cyst.  No hydronephrosis.  Abdominal aorta:  No aneurysm identified.  IMPRESSION: Suspected layering gallbladder sludge.  No associated findings to suggest acute cholecystitis.  Bilateral renal cysts.   Original Report Authenticated By: Charline Bills, M.D.               Blood pressure 153/80, pulse 82, temperature 97.9 F (36.6 C), temperature source Oral, resp. rate 16, height 5\' 5"  (1.651 m), weight 68.9 kg (151 lb 14.4 oz), SpO2 100.00%.  Physical exam:   General--talkative African-American female in no acute distress Heart--regular rate and rhythm that murmurs are gallops Lungs--clear Abdomen--none distended and soft and nontender   Assessment: 1.Weight Loss/dysphagia. Patient has had barium swallow at [facility] in speech pathology evaluation here to does not really show any significant problem. It appears that the main problem may be anorexia. She did have some thrush but this was probably because of her tetracyclines and her symptoms are much better with Diflucan. 2. LLL pneumonia. This could have been the source of her fever. Interesting that this CT of the chest at [facility] did not show pneumonia 3. Tickborne illness. Patient is under the assumption that she has Lyme disease 4. Gallbladder sludge of questionable significance  Plan: At this  point, she appears to be in her meal without any problem. She is denying abdominal pain. I don't think that the gallbladder sludge is a factor. I'll follow her clinically and see how she does.   Berneda Piccininni JR,Mycala Warshawsky L 05/14/2013, 3:13 PM

## 2013-05-14 NOTE — Progress Notes (Signed)
VASCULAR LAB PRELIMINARY  PRELIMINARY  PRELIMINARY  PRELIMINARY  Bilateral lower extremity venous Dopplers completed.    Preliminary report:  There is no DVT or SVT noted in the bilateral lower extremities.  Labradford Schnitker, RVT 05/14/2013, 12:36 PM

## 2013-05-14 NOTE — Progress Notes (Signed)
Pt. Left eye is red with no c/o pain, itching, or drainage. Pt. Request to use her PRN artifical tears to rinse eye, with no change.  Pt states "this happens at home sometimes, but has never been this red". Pt.is Comfortable, will continue to monitor.   Thane Edu, RN

## 2013-05-14 NOTE — Progress Notes (Signed)
[facility] PROGRESS NOTE  [name] ZOX:[id number] DOB: 06-12-1941 DOA: 05/10/2013  PCP: Dr. Ludwig Lean Onc: Dr. Lorre Nick at [facility]  Brief HPI: [name] is a 72 y.o. female with a pmh of hypertension who was recently discharged from [facility] one week ago for treatment of a suspected tick borne infection. The patient was discharged home with doxycycline, started on 05/02/13, and reportedly stayed well until the morning of admission, when the patient noted high fevers. She subsequently presented to the ED, where she was found to have a presenting temp of 102.96F, leukocytosis of 21K, and a cxr revealing a LLL pneumonia.    Past medical history:  Past Medical History  Diagnosis Date  . Hypertension     Consultants: [facility]  Procedures:  2D ECHO 7/31 Study Conclusions  - Left ventricle: The cavity size was normal. There was mild concentric hypertrophy. Systolic function was normal. The estimated ejection fraction was in the range of 55% to 60%. Wall motion was normal; there were no regional wall motion abnormalities. Doppler parameters are consistent with abnormal left ventricular relaxation (grade 1 diastolic dysfunction). - Left atrium: The atrium was mildly to moderately dilated.  Antibiotics: Iv Cefepime 7/29-->8/1 IV Vanc 7/29-->8/1 Oral Doxy 7/21--> Levaquin 8/1--> Diflucan 7/30-->  Subjective: Patient feels better today. Denies any nausea or vomiting. Swallowing is better. Still with some mouth pain.  Objective: Vital Signs  Filed Vitals:   05/13/13 1842 05/13/13 1945 05/13/13 2045 05/14/13 0452  BP: 145/80 143/84 144/87 137/85  Pulse: 86 100 99 105  Temp: 99.3 F (37.4 C) 99.5 F (37.5 C) 99.3 F (37.4 C) 99.7 F (37.6 C)  TempSrc: Oral Oral Oral Oral  Resp: 18 18 18 18   Height:      Weight:      SpO2:    100%    Intake/Output Summary (Last 24 hours) at 05/14/13 1156 Last data filed at 05/14/13 0300  Gross per 24 hour  Intake 886.25 ml   Output    300 ml  Net 586.25 ml   Filed Weights   05/10/13 1700  Weight: 68.9 kg (151 lb 14.4 oz)   General appearance: alert, cooperative, appears stated age and no distress Throat: whitish plaques noted in back of mouth Resp: Improved air entry bilaterally. Less crackles. No wheezing. Cardio: regular rate and rhythm, S1, S2 normal, no murmur, click, rub or gallop GI: soft, minimal tenderness in RUQ without murphy's sign. bowel sounds normal; no masses,  no organomegaly Extremities: 1+ edema noted. No erythema. No calf tenderness. Neurologic: Alert. No focal deficits.  Lab Results:  Basic Metabolic Panel:  Recent Labs Lab 05/10/13 1230 05/10/13 1831 05/11/13 0500 05/12/13 0630 05/13/13 0522 05/14/13 0355  NA 134*  --  134* 135 137 133*  K 4.0  --  3.7 3.1* 3.4* 3.6  CL 99  --  102 105 107 103  CO2 22  --  23 21 21 21   GLUCOSE 86  --  86 83 82 78  BUN 11  --  11 11 9 8   CREATININE 0.93 1.06 1.03 0.99 1.03 0.99  CALCIUM 9.1  --  8.3* 8.2* 8.3* 8.5  MG  --   --   --   --  1.4*  --    Liver Function Tests:  Recent Labs Lab 05/10/13 1230 05/11/13 0500 05/12/13 0630 05/14/13 0355  AST 14 14 7 9   ALT 6 <5 <5 <5  ALKPHOS 44 34* 34* 36*  BILITOT 0.6 0.6 0.4  0.3  PROT 7.8 6.3 5.8* 6.3  ALBUMIN 2.3* 1.9* 1.7* 1.8*    Recent Labs Lab 05/10/13 1230  LIPASE 23   CBC:  Recent Labs Lab 05/10/13 1335 05/10/13 1831 05/11/13 0500 05/12/13 0630 05/13/13 0522 05/14/13 0355  WBC 21.0* 23.2* 20.4* 22.0* 21.1* 18.5*  NEUTROABS 14.3*  --   --   --   --   --   HGB 8.7* 7.9* 7.7* 7.3* 7.3* 8.8*  HCT 27.2* 24.6* 23.5* 22.7* 22.5* 27.6*  MCV 76.2* 75.2* 75.3* 75.7* 75.3* 77.5*  PLT 213 171 170 137* 156 152     Recent Results (from the past 240 hour(s))  CULTURE, BLOOD (ROUTINE X 2)     Status: None   Collection Time    05/10/13  2:44 PM      Result Value Range Status   Specimen Description BLOOD FOREARM LEFT   Final   Special Requests     Final   Value:  BOTTLES DRAWN AEROBIC AND ANAEROBIC 10CC BLUE 3CC RED   Culture  Setup Time 05/10/2013 23:50   Final   Culture     Final   Value:        BLOOD CULTURE RECEIVED NO GROWTH TO DATE CULTURE WILL BE HELD FOR 5 DAYS BEFORE ISSUING A FINAL NEGATIVE REPORT   Report Status PENDING   Incomplete      Studies/Results: US Abdomen Complete  05/13/2013   *RADIOLOGY REPORT*  Clinical Data:  Nausea/vomiting  COMPLETE ABDOMINAL ULTRASOUND  Comparison:  None.  Findings:  Gallbladder:  Suspected layering gallbladder sludge.  No gallbladder wall thickening or cholecystic fluid.  Negative sonographic Murphy's sign.  Common bile duct:  Measures 4 mm.  Liver:  No focal lesion identified.  At the upper limits of normal for parenchymal echogenicity.  IVC:  Appears normal.  Pancreas:  Incompletely visualized but grossly unremarkable.  Spleen:  Measures 4.7 cm.  Right Kidney:  Measures 10.5 cm.  1.6 x 1.4 x 2.2 cm lower pole cyst.  No hydronephrosis.  Left Kidney:  Measures 8.3 cm.  Poorly visualized.  1.8 x 1.4 x 2.0 cm lower pole cyst.  No hydronephrosis.  Abdominal aorta:  No aneurysm identified.  IMPRESSION: Suspected layering gallbladder sludge.  No associated findings to suggest acute cholecystitis.  Bilateral renal cysts.   Original Report Authenticated By: Charline Bills, M.D.    Medications:  Scheduled: . docusate sodium  100 mg Oral BID  . enoxaparin (LOVENOX) injection  40 mg Subcutaneous Q24H  . febuxostat  80 mg Oral Daily  . feeding supplement  1 Container Oral TID BM  . fluconazole  200 mg Oral Daily  . levofloxacin (LEVAQUIN) IV  500 mg Intravenous Q24H  . metoprolol tartrate  25 mg Oral BID  . nystatin  5 mL Oral QID  . pantoprazole  40 mg Oral QAC breakfast  . sodium chloride  3 mL Intravenous Q12H   Continuous:   YQI:[id number], acetaminophen, morphine injection, ondansetron (ZOFRAN) IV, ondansetron, polyethylene glycol, polyvinyl alcohol  Assessment/Plan:  Principal Problem:    Sepsis Active Problems:   HTN (hypertension)   HCAP (healthcare-associated pneumonia)   Loss of weight   Myelomonocytic leukemia   Dysphagia, unspecified(787.20)   Oral thrush   Microcytic anemia   Protein-calorie malnutrition, severe    Sepsis with HCAP Improving. No fever since 7/30. Blood cultures show no growth. Now on Levaquin. IV for now due to periodic episodes of vomiting. She was recently hospitalized at [facility] for SIRS. She was  initially placed on Vanc and Cefepime but then had resolution of fever with Doxycycline. Lyme serology and RMSF serologies were normal. Ehrlichiosis was also negative.   Dysphagia Had barium swallow at [facility] last week which was unremarkable. She could have candidiasis as she does have oral thrush. She was started on oral diflucan. SLP input noted. She seems to be improving.  Post Prandial Vomiting She has minimal RUQ tenderness. US abdomen report reviewed. Some GB sludge seen but no stones. LFt's are normal. Will consult Gi per family request.   Oral Thrush Nystatin  Pedal edema LE dopplers pending considering recent hospital stay.  History of Myelomonocytic Leukemia/Weight Loss Followed by Dr. Lorre Nick at [facility]. Counts stable. Continue to monitor. She is on Uloric for elevated uric acid. Will need GI work up for weight loss. Will consult while here.  Microcytic Anemia Improved post transfusion. She feels stronger.   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 ECHO report reviewed. No change in management for now.  HTN BP stable and well controlled. Cont home meds for now   Recently Suspected Tick Borne Infection:  Continue Doxycycline for now  DVT prophylaxis  Lovenox subQ   Code Status: Full Code Family Communication: Discussed with patient and her daughter.  Disposition Plan: Not ready for discharge. Daughter thinks patient is unsafe at home and is requesting rehab. Will ask PT/OT to see again. CSW consult.     LOS: 4 days    [facility]  [facility] Pager [phone] 05/14/2013, 11:56 AM  If 8PM-8AM, please contact night-coverage at [email or web address], password [facility]

## 2013-05-14 NOTE — Progress Notes (Signed)
OT EVALUATION  Assessment: Pt with excellent progress. Pt requires min vc for safe use of walker. Demonstrated impulsivity during toileting; however, this was likely to trying to prevent an accident of incontinence. Daughter appears apprehensive of having her mother at home alone. Will further assess in am.    05/13/13 0917  OT Visit Information  Last OT Received On 05/13/13  Assistance Needed +1  History of Present Illness With a pmh of hypertension who was recently discharged from [facility] one week ago for treatment of a suspected tick borne infection. The patient was discharged home with doxycycline, started on 05/02/13, and reportedly stayed well until the morning of admission, when the patient noted high fevers. She subsequently presented to the ED, where she was found to have a presenting temp of 102.68F, leukocytosis of 21K, and a cxr revealing a LLL pneumonia  Precautions  Precautions Fall  Home Living  Family/patient expects to be discharged to: Private residence  Living Arrangements Alone  Available Help at Discharge Family;Available PRN/intermittently  Type of Home House  Home Access Stairs to enter  Entrance Stairs-Number of Steps 1  Home Layout One level  Home Equipment Walker - 2 wheels;Cane - single point;BSC;Shower seat  Prior Function  Level of Independence Independent with assistive device(s)  Comments Pt has been using cane at home. No report of falls and requested use of RW acutely  Communication  Communication No difficulties  Cognition  Arousal/Alertness Awake/alert  Behavior During Therapy WFL for tasks assessed/performed  Overall Cognitive Status Within Functional Limits for tasks assessed  Upper Extremity Assessment  Upper Extremity Assessment Defer to OT evaluation  Lower Extremity Assessment  Lower Extremity Assessment Generalized weakness  Cervical / Trunk Assessment  Cervical / Trunk Assessment Normal  ADL  Eating/Feeding Independent  Grooming Modified  independent  Where Assessed - Grooming Unsupported standing  Upper Body Bathing Set up  Where Assessed - Upper Body Bathing Unsupported sitting  Lower Body Bathing Set up  Where Assessed - Lower Body Bathing Unsupported sit to stand  Upper Body Dressing Set up  Where Assessed - Upper Body Dressing Unsupported sitting  Lower Body Dressing Set up  Where Assessed - Lower Body Dressing Unsupported sit to stand  Toilet Transfer Supervision/safety  Toilet Transfer Method Other (comment) (ambulaing)  Toilet Transfer Equipment Comfort height toilet  Toileting - Clothing Manipulation and Hygiene Modified independent  Where Assessed - Toileting Clothing Manipulation and Hygiene Sit to stand from 3-in-1 or toilet  Equipment Used Gait belt;Rolling walker  Transfers/Ambulation Related to ADLs mod A with tranfers. S with ambulation  ADL Comments minimal decline from baseline  Vision - History  Baseline Vision Wears glasses all the time  Bed Mobility  Bed Mobility Supine to Sit  Supine to Sit 6: Modified independent (Device/Increase time);HOB flat  Transfers  Transfers Sit to Stand;Stand to Sit  Sit to Stand 6: Modified independent (Device/Increase time);From bed  Stand to Sit 6: Modified independent (Device/Increase time);To chair/3-in-1  Balance  Balance Assessed (WFL for ADL)  OT - End of Session  Equipment Utilized During Treatment Gait belt;Rolling walker  Activity Tolerance Patient tolerated treatment well  Patient left in bed;with call bell/phone within reach;with family/visitor present  Nurse Communication Mobility status;Other (comment) (rec ambulate with nsg)  OT Assessment  OT Recommendation/Assessment Patient needs continued OT Services  OT Problem List Decreased activity tolerance;Decreased knowledge of use of DME or AE  Barriers to Discharge Decreased caregiver support  Barriers to Discharge Comments daughter works during the day  OT Therapy Diagnosis  Generalized weakness   OT Plan  OT Frequency Min 2X/week  OT Treatment/Interventions Self-care/ADL training;Energy conservation;DME and/or AE instruction;Therapeutic exercise;Therapeutic activities;Patient/family education  OT Recommendation  Follow Up Recommendations No OT follow up  OT Equipment None recommended by OT  Individuals Consulted  Consulted and Agree with Results and Recommendations Patient  Acute Rehab OT Goals  Patient Stated Goal to go home  OT Goal Formulation With patient  Time For Goal Achievement 05/28/13  Potential to Achieve Goals Good  OT Time Calculation  OT Start Time 1801  OT Stop Time 1832  OT Time Calculation (min) 31 min  OT General Charges  $OT Visit 1 Procedure  OT Evaluation  $Initial OT Evaluation Tier I 1 Procedure  OT Treatments  $Self Care/Home Management  23-37 mins  [facility], OTR/L  [phone] 05/13/2013

## 2013-05-15 DIAGNOSIS — I10 Essential (primary) hypertension: Secondary | ICD-10-CM

## 2013-05-15 MED ORDER — LEVOFLOXACIN 500 MG PO TABS
500.0000 mg | ORAL_TABLET | Freq: Every day | ORAL | Status: DC
  Administered 2013-05-15 – 2013-05-16 (×2): 500 mg via ORAL
  Filled 2013-05-15 (×2): qty 1

## 2013-05-15 NOTE — Progress Notes (Signed)
[facility] PROGRESS NOTE Subjective Pt states she is eating well her lunch plate shows maybe 25% eaten   Objective: Vital signs in last 24 hours: Temp:  [97.9 F (36.6 C)-98.3 F (36.8 C)] 98.2 F (36.8 C) (08/03 0609) Pulse Rate:  [82-89] 85 (08/03 0609) Resp:  [16-19] 19 (08/03 0609) BP: (136-153)/(79-85) 148/85 mmHg (08/03 0609) SpO2:  [98 %-100 %] 98 % (08/03 0609) Last BM Date: 05/14/13  Intake/Output from previous day: 08/02 0701 - 08/03 0700 In: 240 [P.O.:240] Out: 401 [Urine:400; Stool:1] Intake/Output this shift: Total I/O In: 120 [P.O.:120] Out: -   PE: General--no distress Heart-- Lungs-- Abdomen--soft nontender  Lab Results:  Recent Labs  05/13/13 0522 05/14/13 0355  WBC 21.1* 18.5*  HGB 7.3* 8.8*  HCT 22.5* 27.6*  PLT 156 152   BMET  Recent Labs  05/13/13 0522 05/14/13 0355  NA 137 133*  K 3.4* 3.6  CL 107 103  CO2 21 21  CREATININE 1.03 0.99   LFT  Recent Labs  05/14/13 0355  PROT 6.3  AST 9  ALT <5  ALKPHOS 36*  BILITOT 0.3   PT/INR No results found for this basename: LABPROT, INR,  in the last 72 hours PANCREAS No results found for this basename: LIPASE,  in the last 72 hours       Studies/Results: US Abdomen Complete  05/13/2013   *RADIOLOGY REPORT*  Clinical Data:  Nausea/vomiting  COMPLETE ABDOMINAL ULTRASOUND  Comparison:  None.  Findings:  Gallbladder:  Suspected layering gallbladder sludge.  No gallbladder wall thickening or cholecystic fluid.  Negative sonographic Murphy's sign.  Common bile duct:  Measures 4 mm.  Liver:  No focal lesion identified.  At the upper limits of normal for parenchymal echogenicity.  IVC:  Appears normal.  Pancreas:  Incompletely visualized but grossly unremarkable.  Spleen:  Measures 4.7 cm.  Right Kidney:  Measures 10.5 cm.  1.6 x 1.4 x 2.2 cm lower pole cyst.  No hydronephrosis.  Left Kidney:  Measures 8.3 cm.  Poorly visualized.  1.8 x 1.4 x 2.0 cm lower pole cyst.  No  hydronephrosis.  Abdominal aorta:  No aneurysm identified.  IMPRESSION: Suspected layering gallbladder sludge.  No associated findings to suggest acute cholecystitis.  Bilateral renal cysts.   Original Report Authenticated By: Charline Bills, M.D.    Medications: I have reviewed the patient's current medications.  Assessment/Plan: 1. Malnutrition/wt loss. Not sure there is primary GI problem. She simply doesn't seem to be eating enough. ? If she needs calorie counts.   Goddess Gebbia JR,Joncarlos Atkison L 05/15/2013, 1:02 PM

## 2013-05-15 NOTE — Progress Notes (Signed)
[facility] PROGRESS NOTE  [name] ZOX:[id number] DOB: [birth year] DOA: 05/10/2013  PCP: Dr. Ludwig Lean Onc: Dr. Lorre Nick at [place]  Brief HPI: [name] is a 72 y.o. female with a pmh of hypertension who was recently discharged from [facility] one week ago for treatment of a suspected tick borne infection. The patient was discharged home with doxycycline, started on 05/02/13, and reportedly stayed well until the morning of admission, when the patient noted high fevers. She subsequently presented to the ED, where she was found to have a presenting temp of 102.75F, leukocytosis of 21K, and a cxr revealing a LLL pneumonia.    Past medical history:  Past Medical History  Diagnosis Date  . Hypertension     Consultants: [facility]  Procedures:  2D ECHO 7/31 Study Conclusions  - Left ventricle: The cavity size was normal. There was mild concentric hypertrophy. Systolic function was normal. The estimated ejection fraction was in the range of 55% to 60%. Wall motion was normal; there were no regional wall motion abnormalities. Doppler parameters are consistent with abnormal left ventricular relaxation (grade 1 diastolic dysfunction). - Left atrium: The atrium was mildly to moderately dilated.  Antibiotics: Iv Cefepime 7/29-->8/1 IV Vanc 7/29-->8/1 Oral Doxy 7/21--> Levaquin 8/1--> Diflucan 7/30-->  Subjective: Patient continues to feel better. Denies any further nausea or vomiting. Swallowing is better. Left eye is still slightly red. No vision impairment.  Objective: Vital Signs  Filed Vitals:   05/14/13 0452 05/14/13 1443 05/14/13 2029 05/15/13 0609  BP: 137/85 153/80 136/79 148/85  Pulse: 105 82 89 85  Temp: 99.7 F (37.6 C) 97.9 F (36.6 C) 98.3 F (36.8 C) 98.2 F (36.8 C)  TempSrc: Oral Oral Oral Oral  Resp: 18 16 17 19   Height:      Weight:      SpO2: 100% 100% 98% 98%    Intake/Output Summary (Last 24 hours) at 05/15/13 1244 Last data filed at  05/15/13 0800  Gross per 24 hour  Intake    240 ml  Output    401 ml  Net   -161 ml   Filed Weights   05/10/13 1700  Weight: 68.9 kg (151 lb 14.4 oz)   General appearance: alert, cooperative, appears stated age and no distress Throat: whitish plaques noted in back of mouth. Improved. Resp: Improved air entry bilaterally. Less crackles. No wheezing. Cardio: regular rate and rhythm, S1, S2 normal, no murmur, click, rub or gallop GI: soft, Non tender today. bowel sounds normal; no masses,  no organomegaly Extremities: 1+ non pitting edema noted. No erythema. No calf tenderness. Neurologic: Alert. No focal deficits.  Lab Results:  Basic Metabolic Panel:  Recent Labs Lab 05/10/13 1230 05/10/13 1831 05/11/13 0500 05/12/13 0630 05/13/13 0522 05/14/13 0355  NA 134*  --  134* 135 137 133*  K 4.0  --  3.7 3.1* 3.4* 3.6  CL 99  --  102 105 107 103  CO2 22  --  23 21 21 21   GLUCOSE 86  --  86 83 82 78  BUN 11  --  11 11 9 8   CREATININE 0.93 1.06 1.03 0.99 1.03 0.99  CALCIUM 9.1  --  8.3* 8.2* 8.3* 8.5  MG  --   --   --   --  1.4*  --    Liver Function Tests:  Recent Labs Lab 05/10/13 1230 05/11/13 0500 05/12/13 0630 05/14/13 0355  AST 14 14 7 9   ALT 6 <5 <5 <5  ALKPHOS 44 34* 34* 36*  BILITOT 0.6 0.6 0.4 0.3  PROT 7.8 6.3 5.8* 6.3  ALBUMIN 2.3* 1.9* 1.7* 1.8*    Recent Labs Lab 05/10/13 1230  LIPASE 23   CBC:  Recent Labs Lab 05/10/13 1335 05/10/13 1831 05/11/13 0500 05/12/13 0630 05/13/13 0522 05/14/13 0355  WBC 21.0* 23.2* 20.4* 22.0* 21.1* 18.5*  NEUTROABS 14.3*  --   --   --   --   --   HGB 8.7* 7.9* 7.7* 7.3* 7.3* 8.8*  HCT 27.2* 24.6* 23.5* 22.7* 22.5* 27.6*  MCV 76.2* 75.2* 75.3* 75.7* 75.3* 77.5*  PLT 213 171 170 137* 156 152     Recent Results (from the past 240 hour(s))  CULTURE, BLOOD (ROUTINE X 2)     Status: None   Collection Time    05/10/13  2:44 PM      Result Value Range Status   Specimen Description BLOOD FOREARM LEFT   Final    Special Requests     Final   Value: BOTTLES DRAWN AEROBIC AND ANAEROBIC 10CC BLUE 3CC RED   Culture  Setup Time 05/10/2013 23:50   Final   Culture     Final   Value:        BLOOD CULTURE RECEIVED NO GROWTH TO DATE CULTURE WILL BE HELD FOR 5 DAYS BEFORE ISSUING A FINAL NEGATIVE REPORT   Report Status PENDING   Incomplete      Studies/Results: US Abdomen Complete  05/13/2013   *RADIOLOGY REPORT*  Clinical Data:  Nausea/vomiting  COMPLETE ABDOMINAL ULTRASOUND  Comparison:  None.  Findings:  Gallbladder:  Suspected layering gallbladder sludge.  No gallbladder wall thickening or cholecystic fluid.  Negative sonographic Murphy's sign.  Common bile duct:  Measures 4 mm.  Liver:  No focal lesion identified.  At the upper limits of normal for parenchymal echogenicity.  IVC:  Appears normal.  Pancreas:  Incompletely visualized but grossly unremarkable.  Spleen:  Measures 4.7 cm.  Right Kidney:  Measures 10.5 cm.  1.6 x 1.4 x 2.2 cm lower pole cyst.  No hydronephrosis.  Left Kidney:  Measures 8.3 cm.  Poorly visualized.  1.8 x 1.4 x 2.0 cm lower pole cyst.  No hydronephrosis.  Abdominal aorta:  No aneurysm identified.  IMPRESSION: Suspected layering gallbladder sludge.  No associated findings to suggest acute cholecystitis.  Bilateral renal cysts.   Original Report Authenticated By: Charline Bills, M.D.    Medications:  Scheduled: . docusate sodium  100 mg Oral BID  . enoxaparin (LOVENOX) injection  40 mg Subcutaneous Q24H  . febuxostat  80 mg Oral Daily  . feeding supplement  1 Container Oral TID BM  . fluconazole  200 mg Oral Daily  . levofloxacin  500 mg Oral Daily  . metoprolol tartrate  25 mg Oral BID  . nystatin  5 mL Oral QID  . pantoprazole  40 mg Oral QAC breakfast  . sodium chloride  3 mL Intravenous Q12H   Continuous:   WGN:[id number], acetaminophen, morphine injection, ondansetron (ZOFRAN) IV, ondansetron, polyethylene glycol, polyvinyl alcohol  Assessment/Plan:  Principal  Problem:   Sepsis Active Problems:   HTN (hypertension)   HCAP (healthcare-associated pneumonia)   Loss of weight   Myelomonocytic leukemia   Dysphagia, unspecified(787.20)   Oral thrush   Microcytic anemia   Protein-calorie malnutrition, severe    Sepsis with HCAP Improving. No fever since 7/30. Blood cultures show no growth. Now on Levaquin. She was recently hospitalized at [facility] for SIRS. She was initially  placed on Vanc and Cefepime but then had resolution of fever with Doxycycline. Lyme serology and RMSF serologies were normal. Ehrlichiosis was also negative.   Dysphagia Had barium swallow at [facility] last week which was unremarkable. She could have candidiasis as she does have oral thrush. She was started on oral diflucan. SLP input noted. She seems to be improving.  Post Prandial Vomiting No further episodes for last 2 days. Appreciate GI input. Some GB sludge seen on [place] but no stones. LFt's are normal. Since she is better further work up could be pursued as OP. But will see what GI says today.  Oral Thrush Nystatin  Pedal edema No DVT on LE dopplers  History of Myelomonocytic Leukemia/Weight Loss Followed by Dr. Lorre Nick at [place]. Counts stable. Continue to monitor. She is on Uloric for elevated uric acid. Will need GI work up for weight loss. Will consult while here.  Microcytic Anemia Improved post transfusion. She feels stronger.   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 ECHO report reviewed. No change in management for now.  HTN BP stable and well controlled. Cont home meds for now   Recently Suspected Tick Borne Infection:  Has completed almost 2 weeks of treatment with Doxy. Will stop after tomorrow dose.  DVT prophylaxis  Lovenox subQ   Code Status: Full Code Family Communication: Discussed with patient and her daughter.  Disposition Plan: Daughter thinks patient is unsafe at home and is requesting rehab. Will ask PT/OT to see again. CSW consult. Possible DC 8/4 if no GI  work up is planned.     LOS: 5 days   [facility]  [facility] Pager [phone] 05/15/2013, 12:44 PM  If 8PM-8AM, please contact night-coverage at [email or web address], password [facility]

## 2013-05-16 LAB — CULTURE, BLOOD (ROUTINE X 2): Culture: NO GROWTH

## 2013-05-16 MED ORDER — DSS 100 MG PO CAPS: 100.0000 mg | ORAL_CAPSULE | Freq: Two times a day (BID) | ORAL | Status: DC

## 2013-05-16 MED ORDER — FLUCONAZOLE 200 MG PO TABS: 200.0000 mg | ORAL_TABLET | Freq: Every day | ORAL | Status: DC

## 2013-05-16 MED ORDER — BOOST / RESOURCE BREEZE PO LIQD: 1.0000 | Freq: Three times a day (TID) | ORAL | Status: DC

## 2013-05-16 MED ORDER — NYSTATIN 100000 UNIT/ML MT SUSP: 5.0000 mL | Freq: Four times a day (QID) | OROMUCOSAL | Status: DC

## 2013-05-16 MED ORDER — LEVOFLOXACIN 500 MG PO TABS: 500.0000 mg | ORAL_TABLET | Freq: Every day | ORAL | Status: DC

## 2013-05-16 NOTE — Progress Notes (Signed)
Subjective: Patient reports progressively improving appetite, and reports continuing to slowly eat better.  Objective: Vital signs in last 24 hours: Temp:  [98.1 F (36.7 C)-99 F (37.2 C)] 99 F (37.2 C) (08/04 0414) Pulse Rate:  [78-90] 90 (08/04 0414) Resp:  [16-18] 18 (08/04 0414) BP: (146-148)/(76-81) 147/81 mmHg (08/04 0414) SpO2:  [97 %-100 %] 97 % (08/04 0414) Weight change:  Last BM Date: 05/14/13  PE: GEN:  NAD  Assessment:  1.  Failure to thrive.  Seems to be improving.  No clearcut GI tract explanation.  Doubt gallbladder sludge is playing significant role into these symptoms.  Plan:  1.  Continue to advance diet as tolerated. 2.  No clear role for further GI tract evaluation at this time. 3.  Will sign-off; thank you for the consult; please call with questions.   [name] 05/16/2013, 2:07 PM

## 2013-05-16 NOTE — Discharge Summary (Signed)
[facility]  Physician Discharge Summary   Patient ID: Mariama Saintvil MRN: [id number] DOB/AGE: 1940-10-28 72 y.o.  Admit date: 05/10/2013 Discharge date: 05/16/2013  PCP: Bufford Spikes, MD  DISCHARGE DIAGNOSES:  Principal Problem:   Sepsis Active Problems:   HTN (hypertension)   HCAP (healthcare-associated pneumonia)   Loss of weight   Myelomonocytic leukemia   Dysphagia, unspecified(787.20)   Oral thrush   Microcytic anemia   Protein-calorie malnutrition, severe   RECOMMENDATIONS FOR OUTPATIENT FOLLOW UP: 1. Needs outpatient GI work up 2. Home health will be arranged  DISCHARGE CONDITION: fair  Diet recommendation: Regular  Filed Weights   05/10/13 1700  Weight: 68.9 kg (151 lb 14.4 oz)    INITIAL HISTORY: Ady Heimann is a 72 y.o. female with a pmh of hypertension who was recently discharged from [facility] one week ago for treatment of a suspected tick borne infection. The patient was discharged home with doxycycline, started on 05/02/13, and reportedly stayed well until the morning of admission, when the patient noted high fevers. She subsequently presented to the ED, where she was found to have a presenting temp of 102.68F, leukocytosis of 21K, and a cxr revealing a LLL pneumonia.   Consultations:  [facility] (Dr. Randa Evens)   Procedures: 2D ECHO 7/31  Study Conclusions  - Left ventricle: The cavity size was normal. There was mild concentric hypertrophy. Systolic function was normal. The estimated ejection fraction was in the range of 55% to 60%. Wall motion was normal; there were no regional wall motion abnormalities. Doppler parameters are consistent with abnormal left ventricular relaxation (grade 1 diastolic dysfunction). - Left atrium: The atrium was mildly to moderately dilated.  LE Venous Dopplers 8/2 No DVT.  HOSPITAL COURSE:   Sepsis with HCAP  She improved with antibiotics. She was transitioned to oral Levaquin and has been doing well. She is  saturating well on RA. No fever since 7/30. Blood cultures show no growth. She was recently hospitalized at [facility] for SIRS. She was initially placed on Vanc and Cefepime but then had resolution of fever with Doxycycline. Lyme serology and RMSF serologies were normal. Ehrlichiosis was also negative.   Dysphagia  Patient had been having difficulty swallowing. She had barium swallow at [facility] which was unremarkable. She could have candidiasis as she does have oral thrush. She was started on oral diflucan. SLP did not see any oropharyngeal issues. Gi did not feel an EGD was indicated. She is now tolerating her diet well. Antifungals might have helped. She will be treated with Diflucan for 2 more weeks.  Post Prandial Vomiting  No further episodes noted. GI has seen and felt EGD was not necessary. Some GB sludge seen on [place] but no stones. LFt's are normal. Since she is better, further work up could be pursued as OP.   Oral Thrush  Improved with Nystatin.  Pedal edema  No DVT on LE dopplers. Keep legs elevated.  History of Myelomonocytic Leukemia/Weight Loss  Followed by Dr. Lorre Nick at [facility]. Counts stable. She is on Uloric for elevated uric acid. Will need GI work up for weight loss. Can be pursued as OP.   Microcytic Anemia  Since she was feeling fatigued she was transfused one unit of PRBC and she is feeling better.   SVT at [facility]  On beta blocker. ECHO report reviewed. No change in management for now.   HTN BP stable and well controlled. Cont home meds for now   Recently Suspected Tick Borne Infection:  Has completed  almost 2 weeks of treatment with Doxy. Will stop now.  She has improved. She was seen by PT/OT and home health will be arranged. Family wanted SNF but she doesn't need it. Patient understands. Discussed with daughter as well. She will be discharged home.    PERTINENT LABS: The results of significant diagnostics from this hospitalization (including imaging, microbiology,  ancillary and laboratory) are listed below for reference.    Microbiology: Recent Results (from the past 240 hour(s))  CULTURE, BLOOD (ROUTINE X 2)     Status: None   Collection Time    05/10/13  2:44 PM      Result Value Range Status   Specimen Description BLOOD FOREARM LEFT   Final   Special Requests     Final   Value: BOTTLES DRAWN AEROBIC AND ANAEROBIC 10CC BLUE 3CC RED   Culture  Setup Time 05/10/2013 23:50   Final   Culture NO GROWTH 5 DAYS   Final   Report Status 05/16/2013 FINAL   Final     Labs: Basic Metabolic Panel:  Recent Labs Lab 05/10/13 1230 05/10/13 1831 05/11/13 0500 05/12/13 0630 05/13/13 0522 05/14/13 0355  NA 134*  --  134* 135 137 133*  K 4.0  --  3.7 3.1* 3.4* 3.6  CL 99  --  102 105 107 103  CO2 22  --  23 21 21 21   GLUCOSE 86  --  86 83 82 78  BUN 11  --  11 11 9 8   CREATININE 0.93 1.06 1.03 0.99 1.03 0.99  CALCIUM 9.1  --  8.3* 8.2* 8.3* 8.5  MG  --   --   --   --  1.4*  --    Liver Function Tests:  Recent Labs Lab 05/10/13 1230 05/11/13 0500 05/12/13 0630 05/14/13 0355  AST 14 14 7 9   ALT 6 <5 <5 <5  ALKPHOS 44 34* 34* 36*  BILITOT 0.6 0.6 0.4 0.3  PROT 7.8 6.3 5.8* 6.3  ALBUMIN 2.3* 1.9* 1.7* 1.8*    Recent Labs Lab 05/10/13 1230  LIPASE 23   CBC:  Recent Labs Lab 05/10/13 1335 05/10/13 1831 05/11/13 0500 05/12/13 0630 05/13/13 0522 05/14/13 0355  WBC 21.0* 23.2* 20.4* 22.0* 21.1* 18.5*  NEUTROABS 14.3*  --   --   --   --   --   HGB 8.7* 7.9* 7.7* 7.3* 7.3* 8.8*  HCT 27.2* 24.6* 23.5* 22.7* 22.5* 27.6*  MCV 76.2* 75.2* 75.3* 75.7* 75.3* 77.5*  PLT 213 171 170 137* 156 152    IMAGING STUDIES Dg Chest 2 View  05/10/2013   *RADIOLOGY REPORT*  Clinical Data: Chest pain, fever, and assess  CHEST - 2 VIEW  Comparison: None.  Findings: Low lung volumes with basilar atelectasis.  Patchy airspace opacity in the left lower lobe with a small left effusion compatible with pneumonia.  Mild cardiomegaly with vascular  congestion but no superimposed CHF for edema.  No pneumothorax. Trachea is midline.  Degenerative changes of the spine.  IMPRESSION: Left lower lobe patchy airspace process compatible with pneumonia  Associated mild basilar atelectasis and small effusions   Original Report Authenticated By: Judie Petit. Miles Costain, M.D.   US Abdomen Complete  05/13/2013   *RADIOLOGY REPORT*  Clinical Data:  Nausea/vomiting  COMPLETE ABDOMINAL ULTRASOUND  Comparison:  None.  Findings:  Gallbladder:  Suspected layering gallbladder sludge.  No gallbladder wall thickening or cholecystic fluid.  Negative sonographic Murphy's sign.  Common bile duct:  Measures 4 mm.  Liver:  No focal lesion identified.  At the upper limits of normal for parenchymal echogenicity.  IVC:  Appears normal.  Pancreas:  Incompletely visualized but grossly unremarkable.  Spleen:  Measures 4.7 cm.  Right Kidney:  Measures 10.5 cm.  1.6 x 1.4 x 2.2 cm lower pole cyst.  No hydronephrosis.  Left Kidney:  Measures 8.3 cm.  Poorly visualized.  1.8 x 1.4 x 2.0 cm lower pole cyst.  No hydronephrosis.  Abdominal aorta:  No aneurysm identified.  IMPRESSION: Suspected layering gallbladder sludge.  No associated findings to suggest acute cholecystitis.  Bilateral renal cysts.   Original Report Authenticated By: Charline Bills, M.D.    DISCHARGE EXAMINATION: Filed Vitals:   05/15/13 7829 05/15/13 1440 05/15/13 2037 05/16/13 0414  BP: 148/85 146/76 148/81 147/81  Pulse: 85 78 82 90  Temp: 98.2 F (36.8 C) 98.1 F (36.7 C) 98.3 F (36.8 C) 99 F (37.2 C)  TempSrc: Oral Oral Oral Oral  Resp: 19 16 18 18   Height:      Weight:      SpO2: 98% 100% 98% 97%   General appearance: alert, cooperative, appears stated age and no distress Resp: clear to auscultation bilaterally Cardio: regular rate and rhythm, S1, S2 normal, no murmur, click, rub or gallop GI: soft, non-tender; bowel sounds normal; no masses,  no organomegaly Neurologic: No focal deficits  DISPOSITION: Home  with home health  Discharge Orders   Future Orders Complete By Expires     Discharge diet:  As directed     Comments:      regular    Discharge instructions  As directed     Comments:      Please follow up with Dr. Pam Drown with Gi as well as with your cancer doctor.    Increase activity slowly  As directed        ALLERGIES: No Known Allergies  Current Discharge Medication List    START taking these medications   Details  docusate sodium 100 MG CAPS Take 100 mg by mouth 2 (two) times daily. Qty: 60 capsule, Refills: 0    feeding supplement (RESOURCE BREEZE) LIQD Take 1 Container by mouth 3 (three) times daily between meals. Qty: 90 Container, Refills: 0    fluconazole (DIFLUCAN) 200 MG tablet Take 1 tablet (200 mg total) by mouth daily. Qty: 14 tablet, Refills: 0    levofloxacin (LEVAQUIN) 500 MG tablet Take 1 tablet (500 mg total) by mouth daily. Qty: 5 tablet, Refills: 0    nystatin (MYCOSTATIN) 100000 UNIT/ML suspension Take 5 mLs (500,000 Units total) by mouth 4 (four) times daily. Qty: 120 mL, Refills: 0      CONTINUE these medications which have NOT CHANGED   Details  febuxostat (ULORIC) 40 MG tablet Take 80 mg by mouth daily.    metoprolol tartrate (LOPRESSOR) 25 MG tablet Take 25 mg by mouth 2 (two) times daily.    pantoprazole (PROTONIX) 40 MG tablet Take 40 mg by mouth daily before breakfast.      STOP taking these medications     doxycycline (VIBRA-TABS) 100 MG tablet        Follow-up Information   Follow up with Bufford Spikes, MD. Schedule an appointment as soon as possible for a visit in 2 weeks.   Contact information:   [address] [place] [address] [phone]       Follow up with EDWARDS JR,JAMES L, MD. Schedule an appointment as soon as possible for a visit in 3  weeks.   Contact information:   [address]                         [place]he [place] [address] [phone]       Follow up with  Marin Roberts, MD. Schedule an appointment as soon as possible for a visit in 2 weeks.   Contact information:   [address] [place] [address] [phone]       TOTAL DISCHARGE TIME: 35 mins  [facility]  [facility] Pager [phone]  05/16/2013, 2:35 PM

## 2013-05-16 NOTE — Progress Notes (Signed)
Occupational Therapy Treatment Patient Details Name: Alechia Lezama MRN: [id number] DOB: 12-Nov-1940 Today's Date: 05/16/2013 Time: 4098-1191 OT Time Calculation (min): 40 min  OT Assessment / Plan / Recommendation  History of present illness With a pmh of hypertension who was recently discharged from [facility] one week ago for treatment of a suspected tick borne infection. The patient was discharged home with doxycycline, started on 05/02/13, and reportedly stayed well until the morning of admission, when the patient noted high fevers. She subsequently presented to the ED, where she was found to have a presenting temp of 102.20F, leukocytosis of 21K, and a cxr revealing a LLL pneumonia   OT comments  Pt reassessed for D/C planning. Pt is mod I with transfers and mobility. Mod I with ADL with the exception of S for shower transfers.Pt did not demonstrate any loss of balance with higher level tasks - including braiding, quick turns, head turns,  picking up items from floor, negotiating stairs. Pt demonstrates good safety/judgement during tasks. States that she has family/church friends that can assist as needed. Feel pt is appropriate for D/C home with HHOT/PT/nsg with intermittent S. No equipment needs. Ready for D/C when medically stable.  Follow Up Recommendations  Home health OT    Barriers to Discharge       Equipment Recommendations  None recommended by OT    Recommendations for Other Services    Frequency Min 2X/week   Progress towards OT Goals Progress towards OT goals: Progressing toward goals  Plan Discharge plan remains appropriate    Precautions / Restrictions Precautions Precautions: Fall Restrictions Weight Bearing Restrictions: No   Pertinent Vitals/Pain no apparent distress     ADL  Eating/Feeding: Independent Grooming: Modified independent Where Assessed - Grooming: Unsupported standing Upper Body Bathing: Set up Where Assessed - Upper Body Bathing: Unsupported  sitting Lower Body Bathing: Set up Where Assessed - Lower Body Bathing: Unsupported sit to stand Upper Body Dressing: Modified independent Where Assessed - Upper Body Dressing: Unsupported sitting Lower Body Dressing: Modified independent Where Assessed - Lower Body Dressing: Unsupported sit to stand Toilet Transfer: Modified independent Toilet Transfer Method: Other (comment) (ambulaing) Toilet Transfer Equipment: Comfort height toilet Toileting - Clothing Manipulation and Hygiene: Modified independent Where Assessed - Engineer, mining and Hygiene: Sit to stand from 3-in-1 or toilet Equipment Used: Rolling walker Transfers/Ambulation Related to ADLs: Mod I ADL Comments: making good progress    OT Diagnosis:    OT Problem List:   OT Treatment Interventions:     OT Goals(current goals can now be found in the care plan section) Acute Rehab OT Goals Patient Stated Goal: go home Time For Goal Achievement: 05/28/13 Potential to Achieve Goals: Good  Visit Information  Last OT Received On: 05/16/13 Assistance Needed: +1 PT/OT Co-Evaluation/Treatment: Yes (to assess most appropriate D/C plan) History of Present Illness: With a pmh of hypertension who was recently discharged from [facility] one week ago for treatment of a suspected tick borne infection. The patient was discharged home with doxycycline, started on 05/02/13, and reportedly stayed well until the morning of admission, when the patient noted high fevers. She subsequently presented to the ED, where she was found to have a presenting temp of 102.20F, leukocytosis of 21K, and a cxr revealing a LLL pneumonia    Subjective Data      Prior Functioning    independent   Cognition  Cognition Arousal/Alertness: Awake/alert Behavior During Therapy: WFL for tasks assessed/performed Overall Cognitive Status: Within Functional Limits for tasks assessed  Mobility  Bed Mobility Bed Mobility: Supine to Sit Supine to Sit: 6:  Modified independent (Device/Increase time);HOB flat Transfers Transfers: Sit to Stand;Stand to Sit Sit to Stand: 6: Modified independent (Device/Increase time);From bed Stand to Sit: 6: Modified independent (Device/Increase time);To chair/3-in-1    Exercises      Balance Balance Balance Assessed:  [facility] for ADL) High Level Balance High Level Balance Activites: Braiding;Turns;Sudden stops;Head turns;Direction changes High Level Balance Comments: no LOB. safe   End of Session OT - End of Session Equipment Utilized During Treatment: Rolling walker (at beginning of session) Activity Tolerance: Patient tolerated treatment well Patient left: with call bell/phone within reach;in chair Nurse Communication: Mobility status;Other (comment) (rec ambulate with nsg; D/C plan)  GO     Valeria Krisko,HILLARY 05/16/2013, 9:59 AM Luisa Dago, OTR/L  [phone] 05/16/2013

## 2013-05-16 NOTE — Progress Notes (Signed)
Occupational Therapy Treatment Patient Details Name: [name] MRN: [id number] DOB: [birth year] Today's Date: 05/16/2013 Time: 4098-1191 OT Time Calculation (min): 16 min  OT Assessment / Plan / Recommendation  History of present illness With a pmh of hypertension who was recently discharged from [facility] one week ago for treatment of a suspected tick borne infection. The patient was discharged home with doxycycline, started on 05/02/13, and reportedly stayed well until the morning of admission, when the patient noted high fevers. She subsequently presented to the ED, where she was found to have a presenting temp of 102.32F, leukocytosis of 21K, and a cxr revealing a LLL pneumonia   OT comments  Pt seen again this pm for education on theraband strengthening HEP. Pt demonstrated understanding. Discussed D/C plans with son, who expresses concerns over his mother going home. Explained to son that his mother is at a Mod I level and that she is not needing skilled services at a facility at this time. Feel that intermittent S from family/friends is adequate. Recommended to son that he and his sister help as needed in the evenings. The pt states that she will be able to get help from friends. Continue to recommend HH. Encourgaed son to walk around the unit with his mother @ RW level.  Follow Up Recommendations  Home health OT    Barriers to Discharge       Equipment Recommendations  None recommended by OT    Recommendations for Other Services    Frequency Min 2X/week   Progress towards OT Goals Progress towards OT goals: Progressing toward goals  Plan Discharge plan remains appropriate    Precautions / Restrictions Precautions Precautions: None Restrictions Weight Bearing Restrictions: No   Pertinent Vitals/Pain no apparent distress     ADL  ADL Comments: discussed D/C recommendations withfamily    OT Diagnosis:    OT Problem List:   OT Treatment Interventions:     OT Goals(current goals  can now be found in the care plan section) Acute Rehab OT Goals Patient Stated Goal: go home OT Goal Formulation: With patient Time For Goal Achievement: 05/28/13 Potential to Achieve Goals: Good  Visit Information  Last OT Received On: 05/16/13 Assistance Needed: +1 History of Present Illness: With a pmh of hypertension who was recently discharged from [facility] one week ago for treatment of a suspected tick borne infection. The patient was discharged home with doxycycline, started on 05/02/13, and reportedly stayed well until the morning of admission, when the patient noted high fevers. She subsequently presented to the ED, where she was found to have a presenting temp of 102.32F, leukocytosis of 21K, and a cxr revealing a LLL pneumonia    Subjective Data      Prior Functioning       Cognition  Cognition Arousal/Alertness: Awake/alert Behavior During Therapy: WFL for tasks assessed/performed Overall Cognitive Status: Within Functional Limits for tasks assessed    Mobility  Bed Mobility Bed Mobility: Supine to Sit Supine to Sit: 6: Modified independent (Device/Increase time);HOB flat Transfers Sit to Stand: 6: Modified independent (Device/Increase time);From bed Stand to Sit: 6: Modified independent (Device/Increase time);To chair/3-in-1    Exercises  Other Exercises Other Exercises: BUE generaltheraband strengthening ex level 1`   Balance Balance Balance Assessed:  (WFL for ADL) Standardized Balance Assessment Standardized Balance Assessment: Dynamic Gait Index Dynamic Gait Index Level Surface: Normal Change in Gait Speed: Normal Gait with Horizontal Head Turns: Normal Gait with Vertical Head Turns: Normal Gait and Pivot Turn: Normal Step  Over Obstacle: Normal Step Around Obstacles: Normal Steps: Normal Total Score: 24 High Level Balance High Level Balance Activites: Braiding;Turns;Sudden stops;Head turns;Direction changes High Level Balance Comments: no LOB. safe    End of Session  Pt sitting EOB. Call bell within reach. Family present.  GO     Mahlik Lenn,HILLARY 05/16/2013, 2:07 PM [facility], OTR/L  [phone] 05/16/2013

## 2013-05-16 NOTE — Progress Notes (Signed)
Physical Therapy Treatment Patient Details Name: [name] MRN: [id number] DOB: [birth year] Today's Date: 05/16/2013 Time: 7829-5621 PT Time Calculation (min): 40 min  PT Assessment / Plan / Recommendation  History of Present Illness With a pmh of hypertension who was recently discharged from [facility] one week ago for treatment of a suspected tick borne infection. The patient was discharged home with doxycycline, started on 05/02/13, and reportedly stayed well until the morning of admission, when the patient noted high fevers. She subsequently presented to the ED, where she was found to have a presenting temp of 102.54F, leukocytosis of 21K, and a cxr revealing a LLL pneumonia   PT Comments   Pt demonstrates independent levels for all activities. Patient able to ambulate extended distance without any assistive device, patient navigates stairs without difficulty, patient able to carry linens while ambulating and set up chair.  No difficulties with picking up objects. Patient may demonstrate modest deficits in strength and activity tolerance compared to baseline, feel that these deficits can be addressed with HHPT in patient's own home.    Follow Up Recommendations  Home health PT           Equipment Recommendations  None recommended by PT       Frequency Min 3X/week   Progress towards PT Goals Progress towards PT goals: Progressing toward goals  Plan Current plan remains appropriate    Precautions / Restrictions Precautions Precautions: Fall Restrictions Weight Bearing Restrictions: No   Pertinent Vitals/Pain Pt reports no pain at this time    Mobility  Bed Mobility Bed Mobility: Supine to Sit Supine to Sit: 6: Modified independent (Device/Increase time);HOB flat Transfers Transfers: Sit to Stand;Stand to Sit Sit to Stand: 6: Modified independent (Device/Increase time);From bed Stand to Sit: 6: Modified independent (Device/Increase time);To  chair/3-in-1 Ambulation/Gait Ambulation/Gait Assistance: 6: Modified independent (Device/Increase time) Ambulation Distance (Feet): 540 Feet Assistive device: None Ambulation/Gait Assistance Details: VCs for increased cadence Gait Pattern: Step-through pattern;Decreased stride length Stairs: Yes Stairs Assistance: 6: Modified independent (Device/Increase time) Stair Management Technique: One rail Right;Forwards Number of Stairs: 4      PT Goals (current goals can now be found in the care plan section) Acute Rehab PT Goals Patient Stated Goal: go home PT Goal Formulation: With patient Time For Goal Achievement: 05/20/13 Potential to Achieve Goals: Good  Visit Information  Last PT Received On: 05/16/13 Assistance Needed: +1 History of Present Illness: With a pmh of hypertension who was recently discharged from [facility] one week ago for treatment of a suspected tick borne infection. The patient was discharged home with doxycycline, started on 05/02/13, and reportedly stayed well until the morning of admission, when the patient noted high fevers. She subsequently presented to the ED, where she was found to have a presenting temp of 102.54F, leukocytosis of 21K, and a cxr revealing a LLL pneumonia    Subjective Data  Patient Stated Goal: go home   Cognition  Cognition Arousal/Alertness: Awake/alert Behavior During Therapy: WFL for tasks assessed/performed Overall Cognitive Status: Within Functional Limits for tasks assessed    Balance  Balance Balance Assessed:  (WFL for ADL) Standardized Balance Assessment Standardized Balance Assessment: Dynamic Gait Index Dynamic Gait Index Level Surface: Normal Change in Gait Speed: Normal Gait with Horizontal Head Turns: Normal Gait with Vertical Head Turns: Normal Gait and Pivot Turn: Normal Step Over Obstacle: Normal Step Around Obstacles: Normal Steps: Normal Total Score: 24 High Level Balance High Level Balance Activites:  Braiding;Turns;Sudden stops;Head turns;Direction changes High Level Balance Comments:  no LOB. safe  End of Session PT - End of Session Equipment Utilized During Treatment: Gait belt Activity Tolerance: Patient tolerated treatment well Patient left: in chair;with call bell/phone within reach;with family/visitor present Nurse Communication: Mobility status   GP     Fabio Asa 05/16/2013, 1:37 PM Charlotte Crumb, PT DPT  [phone]

## 2013-05-16 NOTE — Progress Notes (Signed)
Pt discharged to home with family. Discharge and medication were given and discussed throughly. All questions and concerns from patient and family were addressed and answered.     Taneisha Fuson M

## 2013-05-16 NOTE — Progress Notes (Signed)
NUTRITION FOLLOW UP  DOCUMENTATION CODES  Per approved criteria   -Severe malnutrition in the context of acute illness or injury    Intervention:   1.  Supplements; Continue Resource Breeze TID.  Pt has been provided with coupons for home use.  Recommended Ensure Clear.  Also encouraged high protein foods and reviewed food list with pt.  2.  Continue to encourage intake as able.  Also encouraged pt to consume foods with medication when appropriate to prevent stomach upset.   Nutrition Dx:   Inadequate oral intake, ongoing.   Monitor:   1.  Food/Beverage; pt meeting >/=90% estimated needs with tolerance.  Met, pt with significantly improved intake 2.  Wt/wt change; monitor trends.  Ongoing.    Assessment:   Pt amitted with sepsis and fevers.  She reported poor appetite and 40-50 lbs wt loss. Pt reports her appetite and intake have improved.   Reviewed GI assessments with some sludge in gallbladder found, but no other acute findings.  Pt's poor appetite was acute in onset several months ago and has now developed in to a persistent problem.  Pt is able to verbalize understanding of nutrition needs and reports that she has not been as "motivated" in the past few months to eat as she is now.  Question whether pt's acute appetite loss, now chronic malnutrition, may improve with time and continued encouragement.  Discussed meal plans with pt and encouraged continued supplement use at home.  Pt is not likely meeting 100% of her needs at this time, but she has shown signs of improvement and return in some appetite.  She is eating small amounts at all three 3 meals + 1 supplement, and a few snacks and beverages.  Discussed ways to improve intake and continue to meet calorie needs.  Encouraged pt to request wt checks with PCP to ensure her nutrition needs are met and wt has stabilized.   Height: Ht Readings from Last 1 Encounters:  05/10/13 5\' 5"  (1.651 m)    Weight Status:   Wt Readings from  Last 1 Encounters:  05/10/13 151 lb 14.4 oz (68.9 kg)    Re-estimated needs:  Kcal: 1930-2200 Protein: 82-96g Fluid: ~2.0 L/day  Skin: generalized edema  Diet Order: General   Intake/Output Summary (Last 24 hours) at 05/16/13 1257 Last data filed at 05/16/13 0600  Gross per 24 hour  Intake    360 ml  Output    500 ml  Net   -140 ml    Last BM: 8/2   Labs:   Recent Labs Lab 05/12/13 0630 05/13/13 0522 05/14/13 0355  NA 135 137 133*  K 3.1* 3.4* 3.6  CL 105 107 103  CO2 21 21 21   BUN 11 9 8   CREATININE 0.99 1.03 0.99  CALCIUM 8.2* 8.3* 8.5  MG  --  1.4*  --   GLUCOSE 83 82 78    CBG (last 3)  No results found for this basename: GLUCAP,  in the last 72 hours  Scheduled Meds: . docusate sodium  100 mg Oral BID  . enoxaparin (LOVENOX) injection  40 mg Subcutaneous Q24H  . febuxostat  80 mg Oral Daily  . feeding supplement  1 Container Oral TID BM  . fluconazole  200 mg Oral Daily  . levofloxacin  500 mg Oral Daily  . metoprolol tartrate  25 mg Oral BID  . nystatin  5 mL Oral QID  . pantoprazole  40 mg Oral QAC breakfast  . sodium  chloride  3 mL Intravenous Q12H    Continuous Infusions:   Loyce Dys, MS RD LDN Clinical Inpatient Dietitian Pager: [phone] Weekend/After hours pager: [phone]

## 2013-05-31 LAB — COMPREHENSIVE METABOLIC PANEL
Albumin: 2.4 g/dL — ABNORMAL LOW (ref 3.4–5.0)
Alkaline Phosphatase: 53 U/L (ref 50–136)
Anion Gap: 10 (ref 7–16)
BUN: 11 mg/dL (ref 7–18)
Calcium, Total: 8.7 mg/dL (ref 8.5–10.1)
Chloride: 101 mmol/L (ref 98–107)
Creatinine: 1.35 mg/dL — ABNORMAL HIGH (ref 0.60–1.30)
EGFR (African American): 46 — ABNORMAL LOW
EGFR (Non-African Amer.): 40 — ABNORMAL LOW
Glucose: 84 mg/dL (ref 65–99)
Osmolality: 274 (ref 275–301)
SGOT(AST): 15 U/L (ref 15–37)
SGPT (ALT): 9 U/L — ABNORMAL LOW (ref 12–78)
Sodium: 138 mmol/L (ref 136–145)
Total Protein: 8.3 g/dL — ABNORMAL HIGH (ref 6.4–8.2)

## 2013-05-31 LAB — CBC CANCER CENTER
Comment - H1-Com1: NORMAL
HGB: 8.6 g/dL — ABNORMAL LOW (ref 12.0–16.0)
Lymphocytes: 9 %
MCV: 76 fL — ABNORMAL LOW (ref 80–100)
RBC: 3.55 10*6/uL — ABNORMAL LOW (ref 3.80–5.20)
RDW: 20.8 % — ABNORMAL HIGH (ref 11.5–14.5)
Segmented Neutrophils: 73 %

## 2013-05-31 LAB — LACTATE DEHYDROGENASE: LDH: 156 U/L (ref 81–246)

## 2013-05-31 LAB — URIC ACID: Uric Acid: 3.7 mg/dL (ref 2.6–6.0)

## 2013-06-05 LAB — CULTURE, BLOOD (SINGLE)

## 2013-06-07 LAB — CBC CANCER CENTER
MCH: 24.4 pg — ABNORMAL LOW (ref 26.0–34.0)
Monocytes: 21 %
RDW: 20.9 % — ABNORMAL HIGH (ref 11.5–14.5)
Segmented Neutrophils: 73 %
WBC: 16.8 x10 3/mm — ABNORMAL HIGH (ref 3.6–11.0)

## 2013-06-07 LAB — BASIC METABOLIC PANEL
BUN: 12 mg/dL (ref 7–18)
Calcium, Total: 8.5 mg/dL (ref 8.5–10.1)
Co2: 28 mmol/L (ref 21–32)
Creatinine: 1.47 mg/dL — ABNORMAL HIGH (ref 0.60–1.30)
EGFR (African American): 41 — ABNORMAL LOW
EGFR (Non-African Amer.): 36 — ABNORMAL LOW
Glucose: 93 mg/dL (ref 65–99)
Osmolality: 277 (ref 275–301)
Potassium: 2.8 mmol/L — ABNORMAL LOW (ref 3.5–5.1)
Sodium: 139 mmol/L (ref 136–145)

## 2013-06-13 ENCOUNTER — Ambulatory Visit: Payer: Self-pay | Admitting: Internal Medicine

## 2013-06-14 LAB — BASIC METABOLIC PANEL
Anion Gap: 6 — ABNORMAL LOW (ref 7–16)
Chloride: 101 mmol/L (ref 98–107)
Co2: 27 mmol/L (ref 21–32)
Creatinine: 2.07 mg/dL — ABNORMAL HIGH (ref 0.60–1.30)
EGFR (African American): 27 — ABNORMAL LOW
EGFR (Non-African Amer.): 24 — ABNORMAL LOW
Osmolality: 273 (ref 275–301)
Sodium: 134 mmol/L — ABNORMAL LOW (ref 136–145)

## 2013-06-14 LAB — CANCER CENTER HEMOGLOBIN: HGB: 7.9 g/dL — ABNORMAL LOW (ref 12.0–16.0)

## 2013-06-14 LAB — MAGNESIUM: Magnesium: 1.9 mg/dL

## 2013-06-21 LAB — BASIC METABOLIC PANEL
Anion Gap: 6 — ABNORMAL LOW (ref 7–16)
BUN: 17 mg/dL (ref 7–18)
Calcium, Total: 9.5 mg/dL (ref 8.5–10.1)
Chloride: 104 mmol/L (ref 98–107)
Co2: 29 mmol/L (ref 21–32)
Creatinine: 1.5 mg/dL — ABNORMAL HIGH (ref 0.60–1.30)
EGFR (African American): 40 — ABNORMAL LOW
EGFR (Non-African Amer.): 35 — ABNORMAL LOW
Glucose: 111 mg/dL — ABNORMAL HIGH (ref 65–99)
Potassium: 4 mmol/L (ref 3.5–5.1)
Sodium: 139 mmol/L (ref 136–145)

## 2013-06-28 LAB — COMPREHENSIVE METABOLIC PANEL
Albumin: 2.6 g/dL — ABNORMAL LOW (ref 3.4–5.0)
Alkaline Phosphatase: 78 U/L (ref 50–136)
Anion Gap: 12 (ref 7–16)
BUN: 27 mg/dL — ABNORMAL HIGH (ref 7–18)
Bilirubin,Total: 0.8 mg/dL (ref 0.2–1.0)
Creatinine: 1.54 mg/dL — ABNORMAL HIGH (ref 0.60–1.30)
EGFR (African American): 39 — ABNORMAL LOW
Glucose: 87 mg/dL (ref 65–99)
Osmolality: 278 (ref 275–301)
SGOT(AST): 16 U/L (ref 15–37)
Sodium: 137 mmol/L (ref 136–145)

## 2013-06-28 LAB — URINALYSIS, COMPLETE
Nitrite: NEGATIVE
Protein: 300
Specific Gravity: >=1.03 (ref 1.003–1.030)
WBC UR: >30 /HPF (ref 0–5)

## 2013-06-28 LAB — CBC CANCER CENTER
Basophil #: 0.1 x10 3/mm (ref 0.0–0.1)
HGB: 8.9 g/dL — ABNORMAL LOW (ref 12.0–16.0)
Lymphocyte %: 14.1 %
MCV: 78 fL — ABNORMAL LOW (ref 80–100)
Monocyte %: 13.9 %
Neutrophil #: 9.9 x10 3/mm — ABNORMAL HIGH (ref 1.4–6.5)
Neutrophil %: 71.3 %
Platelet: 191 x10 3/mm (ref 150–440)

## 2013-06-28 LAB — CANCER CENTER HEMOGLOBIN: HGB: 8.9 g/dL — ABNORMAL LOW (ref 12.0–16.0)

## 2013-06-30 LAB — URINE CULTURE

## 2013-07-03 LAB — CULTURE, BLOOD (SINGLE)

## 2013-07-13 ENCOUNTER — Ambulatory Visit: Payer: Self-pay | Admitting: Internal Medicine

## 2013-07-20 DIAGNOSIS — D649 Anemia, unspecified: Secondary | ICD-10-CM | POA: Insufficient documentation

## 2013-07-20 DIAGNOSIS — N951 Menopausal and female climacteric states: Secondary | ICD-10-CM | POA: Insufficient documentation

## 2013-08-01 DIAGNOSIS — D638 Anemia in other chronic diseases classified elsewhere: Secondary | ICD-10-CM | POA: Insufficient documentation

## 2013-08-01 DIAGNOSIS — R0681 Apnea, not elsewhere classified: Secondary | ICD-10-CM | POA: Insufficient documentation

## 2013-08-01 DIAGNOSIS — R069 Unspecified abnormalities of breathing: Secondary | ICD-10-CM | POA: Insufficient documentation

## 2013-08-01 DIAGNOSIS — C9310 Chronic myelomonocytic leukemia not having achieved remission: Secondary | ICD-10-CM | POA: Insufficient documentation

## 2013-08-13 ENCOUNTER — Ambulatory Visit: Payer: Self-pay | Admitting: Internal Medicine

## 2013-08-16 LAB — BASIC METABOLIC PANEL
Anion Gap: 7 (ref 7–16)
BUN: 25 mg/dL — ABNORMAL HIGH (ref 7–18)
Calcium, Total: 8.9 mg/dL (ref 8.5–10.1)
Chloride: 105 mmol/L (ref 98–107)
Creatinine: 1.54 mg/dL — ABNORMAL HIGH (ref 0.60–1.30)
Glucose: 122 mg/dL — ABNORMAL HIGH (ref 65–99)
Osmolality: 285 (ref 275–301)
Sodium: 140 mmol/L (ref 136–145)

## 2013-08-16 LAB — CBC CANCER CENTER
HCT: 31.4 % — ABNORMAL LOW (ref 35.0–47.0)
Lymphocytes: 8 %
MCH: 24.6 pg — ABNORMAL LOW (ref 26.0–34.0)
MCHC: 31.2 g/dL — ABNORMAL LOW (ref 32.0–36.0)
MCV: 79 fL — ABNORMAL LOW (ref 80–100)
Monocytes: 23 %
Platelet: 169 x10 3/mm (ref 150–440)
RBC: 3.99 10*6/uL (ref 3.80–5.20)
RDW: 21.7 % — ABNORMAL HIGH (ref 11.5–14.5)
Segmented Neutrophils: 68 %
WBC: 12.5 x10 3/mm — ABNORMAL HIGH (ref 3.6–11.0)

## 2013-08-16 LAB — IRON AND TIBC
Iron: 26 ug/dL — ABNORMAL LOW (ref 50–170)
Unbound Iron-Bind.Cap.: 154 ug/dL

## 2013-08-30 LAB — CREATININE, SERUM
EGFR (African American): 37 — ABNORMAL LOW
EGFR (Non-African Amer.): 32 — ABNORMAL LOW

## 2013-08-30 LAB — MAGNESIUM: Magnesium: 1.9 mg/dL

## 2013-08-30 LAB — POTASSIUM: Potassium: 3.9 mmol/L (ref 3.5–5.1)

## 2013-08-30 LAB — CANCER CENTER HEMOGLOBIN: HGB: 9.5 g/dL — ABNORMAL LOW (ref 12.0–16.0)

## 2013-09-12 ENCOUNTER — Ambulatory Visit: Payer: Self-pay | Admitting: Internal Medicine

## 2013-09-12 ENCOUNTER — Ambulatory Visit: Payer: Self-pay | Admitting: Gastroenterology

## 2013-09-27 LAB — CBC CANCER CENTER
Bands: 2 %
Basophil: 3 %
HGB: 8.9 g/dL — ABNORMAL LOW (ref 12.0–16.0)
Lymphocytes: 16 %
MCHC: 31.5 g/dL — ABNORMAL LOW (ref 32.0–36.0)
RBC: 3.66 10*6/uL — ABNORMAL LOW (ref 3.80–5.20)
Segmented Neutrophils: 65 %
WBC: 12.4 x10 3/mm — ABNORMAL HIGH (ref 3.6–11.0)

## 2013-09-27 LAB — SEDIMENTATION RATE: Erythrocyte Sed Rate: 75 mm/hr — ABNORMAL HIGH (ref 0–30)

## 2013-09-27 LAB — ALKALINE PHOSPHATASE: Alkaline Phosphatase: 46 U/L

## 2013-09-27 LAB — CREATININE, SERUM: EGFR (Non-African Amer.): 39 — ABNORMAL LOW

## 2013-09-29 DIAGNOSIS — C9210 Chronic myeloid leukemia, BCR/ABL-positive, not having achieved remission: Secondary | ICD-10-CM | POA: Insufficient documentation

## 2013-09-29 DIAGNOSIS — D693 Immune thrombocytopenic purpura: Secondary | ICD-10-CM | POA: Insufficient documentation

## 2013-10-11 LAB — CANCER CENTER HEMOGLOBIN: HGB: 10.5 g/dL — ABNORMAL LOW (ref 12.0–16.0)

## 2013-10-13 ENCOUNTER — Ambulatory Visit: Payer: Self-pay | Admitting: Internal Medicine

## 2013-10-13 DIAGNOSIS — I509 Heart failure, unspecified: Secondary | ICD-10-CM | POA: Diagnosis not present

## 2013-10-13 DIAGNOSIS — R634 Abnormal weight loss: Secondary | ICD-10-CM | POA: Diagnosis not present

## 2013-10-13 DIAGNOSIS — E7800 Pure hypercholesterolemia, unspecified: Secondary | ICD-10-CM | POA: Diagnosis not present

## 2013-10-13 DIAGNOSIS — D693 Immune thrombocytopenic purpura: Secondary | ICD-10-CM | POA: Diagnosis not present

## 2013-10-13 DIAGNOSIS — K219 Gastro-esophageal reflux disease without esophagitis: Secondary | ICD-10-CM | POA: Diagnosis not present

## 2013-10-13 DIAGNOSIS — R110 Nausea: Secondary | ICD-10-CM | POA: Diagnosis not present

## 2013-10-13 DIAGNOSIS — R630 Anorexia: Secondary | ICD-10-CM | POA: Diagnosis not present

## 2013-10-13 DIAGNOSIS — D509 Iron deficiency anemia, unspecified: Secondary | ICD-10-CM | POA: Diagnosis not present

## 2013-10-13 DIAGNOSIS — C9210 Chronic myeloid leukemia, BCR/ABL-positive, not having achieved remission: Secondary | ICD-10-CM | POA: Diagnosis not present

## 2013-10-13 DIAGNOSIS — Z8701 Personal history of pneumonia (recurrent): Secondary | ICD-10-CM | POA: Diagnosis not present

## 2013-10-13 DIAGNOSIS — I129 Hypertensive chronic kidney disease with stage 1 through stage 4 chronic kidney disease, or unspecified chronic kidney disease: Secondary | ICD-10-CM | POA: Diagnosis not present

## 2013-10-13 DIAGNOSIS — N189 Chronic kidney disease, unspecified: Secondary | ICD-10-CM | POA: Diagnosis not present

## 2013-10-13 DIAGNOSIS — Z79899 Other long term (current) drug therapy: Secondary | ICD-10-CM | POA: Diagnosis not present

## 2013-11-08 DIAGNOSIS — C9210 Chronic myeloid leukemia, BCR/ABL-positive, not having achieved remission: Secondary | ICD-10-CM | POA: Diagnosis not present

## 2013-11-08 DIAGNOSIS — R634 Abnormal weight loss: Secondary | ICD-10-CM | POA: Diagnosis not present

## 2013-11-08 DIAGNOSIS — D509 Iron deficiency anemia, unspecified: Secondary | ICD-10-CM | POA: Diagnosis not present

## 2013-11-08 DIAGNOSIS — D693 Immune thrombocytopenic purpura: Secondary | ICD-10-CM | POA: Diagnosis not present

## 2013-11-08 LAB — POTASSIUM: POTASSIUM: 4.1 mmol/L (ref 3.5–5.1)

## 2013-11-08 LAB — CREATININE, SERUM
CREATININE: 1.5 mg/dL — AB (ref 0.60–1.30)
GFR CALC AF AMER: 40 — AB
GFR CALC NON AF AMER: 35 — AB

## 2013-11-08 LAB — CBC CANCER CENTER
BASOS ABS: 0.1 x10 3/mm (ref 0.0–0.1)
BASOS PCT: 1.2 %
EOS ABS: 0.1 x10 3/mm (ref 0.0–0.7)
Eosinophil %: 0.8 %
HCT: 30.2 % — AB (ref 35.0–47.0)
HGB: 9.6 g/dL — AB (ref 12.0–16.0)
Lymphocyte #: 1.1 x10 3/mm (ref 1.0–3.6)
Lymphocyte %: 11 %
MCH: 25.2 pg — ABNORMAL LOW (ref 26.0–34.0)
MCHC: 31.8 g/dL — ABNORMAL LOW (ref 32.0–36.0)
MCV: 79 fL — ABNORMAL LOW (ref 80–100)
MONOS PCT: 32.4 %
Monocyte #: 3.4 x10 3/mm — ABNORMAL HIGH (ref 0.2–0.9)
NEUTROS ABS: 5.7 x10 3/mm (ref 1.4–6.5)
Neutrophil %: 54.6 %
PLATELETS: 228 x10 3/mm (ref 150–440)
RBC: 3.8 10*6/uL (ref 3.80–5.20)
RDW: 21.5 % — AB (ref 11.5–14.5)
WBC: 10.4 x10 3/mm (ref 3.6–11.0)

## 2013-11-13 ENCOUNTER — Ambulatory Visit: Payer: Self-pay | Admitting: Internal Medicine

## 2013-11-13 DIAGNOSIS — Z79899 Other long term (current) drug therapy: Secondary | ICD-10-CM | POA: Diagnosis not present

## 2013-11-13 DIAGNOSIS — D693 Immune thrombocytopenic purpura: Secondary | ICD-10-CM | POA: Diagnosis not present

## 2013-11-13 DIAGNOSIS — C9210 Chronic myeloid leukemia, BCR/ABL-positive, not having achieved remission: Secondary | ICD-10-CM | POA: Diagnosis not present

## 2013-11-22 LAB — FERRITIN: Ferritin (ARMC): 245 ng/mL (ref 8–388)

## 2013-11-22 LAB — CBC CANCER CENTER
BASOS PCT: 1.5 %
Basophil #: 0.1 x10 3/mm (ref 0.0–0.1)
Eosinophil #: 0.1 x10 3/mm (ref 0.0–0.7)
Eosinophil %: 1.3 %
HCT: 33.6 % — AB (ref 35.0–47.0)
HGB: 10.6 g/dL — ABNORMAL LOW (ref 12.0–16.0)
Lymphocyte #: 1.6 x10 3/mm (ref 1.0–3.6)
Lymphocyte %: 15.8 %
MCH: 25.3 pg — ABNORMAL LOW (ref 26.0–34.0)
MCHC: 31.7 g/dL — ABNORMAL LOW (ref 32.0–36.0)
MCV: 80 fL (ref 80–100)
MONO ABS: 3.1 x10 3/mm — AB (ref 0.2–0.9)
Monocyte %: 31.1 %
NEUTROS PCT: 50.3 %
Neutrophil #: 5 x10 3/mm (ref 1.4–6.5)
Platelet: 135 x10 3/mm — ABNORMAL LOW (ref 150–440)
RBC: 4.2 10*6/uL (ref 3.80–5.20)
RDW: 20.5 % — AB (ref 11.5–14.5)
WBC: 9.9 x10 3/mm (ref 3.6–11.0)

## 2013-11-22 LAB — IRON AND TIBC
IRON BIND. CAP.(TOTAL): 201 ug/dL — AB (ref 250–450)
IRON SATURATION: 15 %
Iron: 30 ug/dL — ABNORMAL LOW (ref 50–170)
Unbound Iron-Bind.Cap.: 171 ug/dL

## 2013-12-11 ENCOUNTER — Ambulatory Visit: Payer: Self-pay | Admitting: Internal Medicine

## 2013-12-11 DIAGNOSIS — I10 Essential (primary) hypertension: Secondary | ICD-10-CM | POA: Diagnosis not present

## 2013-12-11 DIAGNOSIS — R634 Abnormal weight loss: Secondary | ICD-10-CM | POA: Diagnosis not present

## 2013-12-11 DIAGNOSIS — Z79899 Other long term (current) drug therapy: Secondary | ICD-10-CM | POA: Diagnosis not present

## 2013-12-11 DIAGNOSIS — D509 Iron deficiency anemia, unspecified: Secondary | ICD-10-CM | POA: Diagnosis not present

## 2013-12-11 DIAGNOSIS — D693 Immune thrombocytopenic purpura: Secondary | ICD-10-CM | POA: Diagnosis not present

## 2013-12-11 DIAGNOSIS — E7800 Pure hypercholesterolemia, unspecified: Secondary | ICD-10-CM | POA: Diagnosis not present

## 2013-12-11 DIAGNOSIS — C9210 Chronic myeloid leukemia, BCR/ABL-positive, not having achieved remission: Secondary | ICD-10-CM | POA: Diagnosis not present

## 2013-12-13 DIAGNOSIS — C9210 Chronic myeloid leukemia, BCR/ABL-positive, not having achieved remission: Secondary | ICD-10-CM | POA: Diagnosis not present

## 2013-12-13 DIAGNOSIS — D693 Immune thrombocytopenic purpura: Secondary | ICD-10-CM | POA: Diagnosis not present

## 2013-12-13 LAB — CANCER CENTER HEMOGLOBIN: HGB: 10.1 g/dL — AB (ref 12.0–16.0)

## 2013-12-20 ENCOUNTER — Ambulatory Visit: Payer: Self-pay | Admitting: Family Medicine

## 2013-12-20 DIAGNOSIS — M899 Disorder of bone, unspecified: Secondary | ICD-10-CM | POA: Diagnosis not present

## 2013-12-20 DIAGNOSIS — M949 Disorder of cartilage, unspecified: Secondary | ICD-10-CM | POA: Diagnosis not present

## 2013-12-20 DIAGNOSIS — N951 Menopausal and female climacteric states: Secondary | ICD-10-CM | POA: Diagnosis not present

## 2014-01-03 DIAGNOSIS — D693 Immune thrombocytopenic purpura: Secondary | ICD-10-CM | POA: Diagnosis not present

## 2014-01-03 DIAGNOSIS — C9210 Chronic myeloid leukemia, BCR/ABL-positive, not having achieved remission: Secondary | ICD-10-CM | POA: Diagnosis not present

## 2014-01-03 LAB — IRON AND TIBC
IRON BIND. CAP.(TOTAL): 183 ug/dL — AB (ref 250–450)
Iron Saturation: 9 %
Iron: 16 ug/dL — ABNORMAL LOW (ref 50–170)
UNBOUND IRON-BIND. CAP.: 167 ug/dL

## 2014-01-03 LAB — CBC CANCER CENTER
EOS PCT: 1 %
HCT: 31.9 % — ABNORMAL LOW (ref 35.0–47.0)
HGB: 10.2 g/dL — ABNORMAL LOW (ref 12.0–16.0)
LYMPHS PCT: 11 %
MCH: 25.5 pg — ABNORMAL LOW (ref 26.0–34.0)
MCHC: 32 g/dL (ref 32.0–36.0)
MCV: 80 fL (ref 80–100)
Monocytes: 25 %
Platelet: 94 x10 3/mm — ABNORMAL LOW (ref 150–440)
RBC: 4.01 10*6/uL (ref 3.80–5.20)
RDW: 17.8 % — ABNORMAL HIGH (ref 11.5–14.5)
Segmented Neutrophils: 63 %
WBC: 11 x10 3/mm (ref 3.6–11.0)

## 2014-01-03 LAB — FERRITIN: Ferritin (ARMC): 278 ng/mL (ref 8–388)

## 2014-01-08 DIAGNOSIS — R059 Cough, unspecified: Secondary | ICD-10-CM | POA: Diagnosis not present

## 2014-01-08 DIAGNOSIS — Z862 Personal history of diseases of the blood and blood-forming organs and certain disorders involving the immune mechanism: Secondary | ICD-10-CM | POA: Diagnosis not present

## 2014-01-08 DIAGNOSIS — I509 Heart failure, unspecified: Secondary | ICD-10-CM | POA: Diagnosis not present

## 2014-01-08 DIAGNOSIS — R079 Chest pain, unspecified: Secondary | ICD-10-CM | POA: Diagnosis not present

## 2014-01-08 DIAGNOSIS — R0602 Shortness of breath: Secondary | ICD-10-CM | POA: Diagnosis not present

## 2014-01-08 DIAGNOSIS — I10 Essential (primary) hypertension: Secondary | ICD-10-CM | POA: Diagnosis not present

## 2014-01-08 DIAGNOSIS — R799 Abnormal finding of blood chemistry, unspecified: Secondary | ICD-10-CM | POA: Diagnosis not present

## 2014-01-08 DIAGNOSIS — R599 Enlarged lymph nodes, unspecified: Secondary | ICD-10-CM | POA: Diagnosis not present

## 2014-01-08 DIAGNOSIS — I517 Cardiomegaly: Secondary | ICD-10-CM | POA: Diagnosis not present

## 2014-01-08 DIAGNOSIS — R51 Headache: Secondary | ICD-10-CM | POA: Diagnosis not present

## 2014-01-08 DIAGNOSIS — Z856 Personal history of leukemia: Secondary | ICD-10-CM | POA: Diagnosis not present

## 2014-01-08 DIAGNOSIS — R05 Cough: Secondary | ICD-10-CM | POA: Diagnosis not present

## 2014-01-08 DIAGNOSIS — I319 Disease of pericardium, unspecified: Secondary | ICD-10-CM | POA: Diagnosis not present

## 2014-01-11 ENCOUNTER — Ambulatory Visit: Payer: Self-pay | Admitting: Internal Medicine

## 2014-01-11 DIAGNOSIS — D509 Iron deficiency anemia, unspecified: Secondary | ICD-10-CM | POA: Diagnosis not present

## 2014-01-11 DIAGNOSIS — C9210 Chronic myeloid leukemia, BCR/ABL-positive, not having achieved remission: Secondary | ICD-10-CM | POA: Diagnosis not present

## 2014-01-11 DIAGNOSIS — R634 Abnormal weight loss: Secondary | ICD-10-CM | POA: Diagnosis not present

## 2014-01-11 DIAGNOSIS — E7800 Pure hypercholesterolemia, unspecified: Secondary | ICD-10-CM | POA: Diagnosis not present

## 2014-01-11 DIAGNOSIS — I10 Essential (primary) hypertension: Secondary | ICD-10-CM | POA: Diagnosis not present

## 2014-01-11 DIAGNOSIS — D693 Immune thrombocytopenic purpura: Secondary | ICD-10-CM | POA: Diagnosis not present

## 2014-01-11 DIAGNOSIS — Z79899 Other long term (current) drug therapy: Secondary | ICD-10-CM | POA: Diagnosis not present

## 2014-01-26 DIAGNOSIS — M25519 Pain in unspecified shoulder: Secondary | ICD-10-CM | POA: Insufficient documentation

## 2014-01-26 DIAGNOSIS — M47812 Spondylosis without myelopathy or radiculopathy, cervical region: Secondary | ICD-10-CM | POA: Insufficient documentation

## 2014-01-31 LAB — CBC CANCER CENTER
BANDS NEUTROPHIL: 1 %
Eosinophil: 1 %
HCT: 31.3 % — ABNORMAL LOW (ref 35.0–47.0)
HGB: 10 g/dL — ABNORMAL LOW (ref 12.0–16.0)
Lymphocytes: 18 %
MCH: 25.2 pg — AB (ref 26.0–34.0)
MCHC: 32 g/dL (ref 32.0–36.0)
MCV: 79 fL — AB (ref 80–100)
MONOS PCT: 16 %
Platelet: 101 x10 3/mm — ABNORMAL LOW (ref 150–440)
RBC: 3.97 10*6/uL (ref 3.80–5.20)
RDW: 18.7 % — ABNORMAL HIGH (ref 11.5–14.5)
SEGMENTED NEUTROPHILS: 64 %
WBC: 9.4 x10 3/mm (ref 3.6–11.0)

## 2014-02-10 ENCOUNTER — Ambulatory Visit: Payer: Self-pay | Admitting: Internal Medicine

## 2014-02-10 DIAGNOSIS — R634 Abnormal weight loss: Secondary | ICD-10-CM | POA: Diagnosis not present

## 2014-02-10 DIAGNOSIS — Z79899 Other long term (current) drug therapy: Secondary | ICD-10-CM | POA: Diagnosis not present

## 2014-02-10 DIAGNOSIS — R109 Unspecified abdominal pain: Secondary | ICD-10-CM | POA: Diagnosis not present

## 2014-02-10 DIAGNOSIS — C9210 Chronic myeloid leukemia, BCR/ABL-positive, not having achieved remission: Secondary | ICD-10-CM | POA: Diagnosis not present

## 2014-02-10 DIAGNOSIS — R7989 Other specified abnormal findings of blood chemistry: Secondary | ICD-10-CM | POA: Diagnosis not present

## 2014-02-10 DIAGNOSIS — D509 Iron deficiency anemia, unspecified: Secondary | ICD-10-CM | POA: Diagnosis not present

## 2014-02-10 DIAGNOSIS — E7800 Pure hypercholesterolemia, unspecified: Secondary | ICD-10-CM | POA: Diagnosis not present

## 2014-02-10 DIAGNOSIS — D693 Immune thrombocytopenic purpura: Secondary | ICD-10-CM | POA: Diagnosis not present

## 2014-02-10 DIAGNOSIS — K219 Gastro-esophageal reflux disease without esophagitis: Secondary | ICD-10-CM | POA: Diagnosis not present

## 2014-02-10 DIAGNOSIS — I10 Essential (primary) hypertension: Secondary | ICD-10-CM | POA: Diagnosis not present

## 2014-02-17 DIAGNOSIS — M47812 Spondylosis without myelopathy or radiculopathy, cervical region: Secondary | ICD-10-CM | POA: Diagnosis not present

## 2014-02-23 DIAGNOSIS — M47812 Spondylosis without myelopathy or radiculopathy, cervical region: Secondary | ICD-10-CM | POA: Diagnosis not present

## 2014-02-27 DIAGNOSIS — M47812 Spondylosis without myelopathy or radiculopathy, cervical region: Secondary | ICD-10-CM | POA: Diagnosis not present

## 2014-02-28 DIAGNOSIS — D509 Iron deficiency anemia, unspecified: Secondary | ICD-10-CM | POA: Diagnosis not present

## 2014-02-28 DIAGNOSIS — C9210 Chronic myeloid leukemia, BCR/ABL-positive, not having achieved remission: Secondary | ICD-10-CM | POA: Diagnosis not present

## 2014-02-28 DIAGNOSIS — D693 Immune thrombocytopenic purpura: Secondary | ICD-10-CM | POA: Diagnosis not present

## 2014-02-28 LAB — CBC CANCER CENTER
BASOS PCT: 0.9 %
Basophil #: 0.1 x10 3/mm (ref 0.0–0.1)
EOS ABS: 0.1 x10 3/mm (ref 0.0–0.7)
Eosinophil %: 1.1 %
HCT: 30.2 % — ABNORMAL LOW (ref 35.0–47.0)
HGB: 9.6 g/dL — AB (ref 12.0–16.0)
Lymphocyte #: 0.6 x10 3/mm — ABNORMAL LOW (ref 1.0–3.6)
Lymphocyte %: 4.4 %
Lymphocytes: 3 %
MCH: 24.7 pg — ABNORMAL LOW (ref 26.0–34.0)
MCHC: 31.7 g/dL — ABNORMAL LOW (ref 32.0–36.0)
MCV: 78 fL — ABNORMAL LOW (ref 80–100)
MONOS PCT: 15.6 %
Monocyte #: 2.2 x10 3/mm — ABNORMAL HIGH (ref 0.2–0.9)
Monocytes: 15 %
NEUTROS PCT: 78 %
Neutrophil #: 10.8 x10 3/mm — ABNORMAL HIGH (ref 1.4–6.5)
Platelet: 52 x10 3/mm — ABNORMAL LOW (ref 150–440)
RBC: 3.88 10*6/uL (ref 3.80–5.20)
RDW: 17.8 % — AB (ref 11.5–14.5)
Segmented Neutrophils: 82 %
WBC: 13.9 x10 3/mm — ABNORMAL HIGH (ref 3.6–11.0)

## 2014-02-28 LAB — IRON AND TIBC
IRON SATURATION: 11 %
Iron Bind.Cap.(Total): 161 ug/dL — ABNORMAL LOW (ref 250–450)
Iron: 17 ug/dL — ABNORMAL LOW (ref 50–170)
Unbound Iron-Bind.Cap.: 144 ug/dL

## 2014-02-28 LAB — FERRITIN: FERRITIN (ARMC): 343 ng/mL (ref 8–388)

## 2014-03-02 DIAGNOSIS — M47812 Spondylosis without myelopathy or radiculopathy, cervical region: Secondary | ICD-10-CM | POA: Diagnosis not present

## 2014-03-06 DIAGNOSIS — M47812 Spondylosis without myelopathy or radiculopathy, cervical region: Secondary | ICD-10-CM | POA: Diagnosis not present

## 2014-03-08 LAB — CBC CANCER CENTER
Basophil: 1 %
Eosinophil: 1 %
HCT: 28.8 % — AB (ref 35.0–47.0)
HGB: 9.1 g/dL — ABNORMAL LOW (ref 12.0–16.0)
LYMPHS PCT: 7 %
MCH: 24.4 pg — ABNORMAL LOW (ref 26.0–34.0)
MCHC: 31.6 g/dL — ABNORMAL LOW (ref 32.0–36.0)
MCV: 77 fL — AB (ref 80–100)
Monocytes: 26 %
Platelet: 107 x10 3/mm — ABNORMAL LOW (ref 150–440)
RBC: 3.72 10*6/uL — ABNORMAL LOW (ref 3.80–5.20)
RDW: 18.3 % — ABNORMAL HIGH (ref 11.5–14.5)
Segmented Neutrophils: 65 %
WBC: 13.7 x10 3/mm — ABNORMAL HIGH (ref 3.6–11.0)

## 2014-03-08 LAB — CREATININE, SERUM
Creatinine: 1.89 mg/dL — ABNORMAL HIGH (ref 0.60–1.30)
EGFR (African American): 31 — ABNORMAL LOW
EGFR (Non-African Amer.): 26 — ABNORMAL LOW

## 2014-03-09 DIAGNOSIS — M47812 Spondylosis without myelopathy or radiculopathy, cervical region: Secondary | ICD-10-CM | POA: Diagnosis not present

## 2014-03-13 ENCOUNTER — Ambulatory Visit: Payer: Self-pay | Admitting: Internal Medicine

## 2014-03-13 DIAGNOSIS — Q619 Cystic kidney disease, unspecified: Secondary | ICD-10-CM | POA: Diagnosis not present

## 2014-03-13 DIAGNOSIS — E7800 Pure hypercholesterolemia, unspecified: Secondary | ICD-10-CM | POA: Diagnosis not present

## 2014-03-13 DIAGNOSIS — Z8701 Personal history of pneumonia (recurrent): Secondary | ICD-10-CM | POA: Diagnosis not present

## 2014-03-13 DIAGNOSIS — R110 Nausea: Secondary | ICD-10-CM | POA: Diagnosis not present

## 2014-03-13 DIAGNOSIS — K2970 Gastritis, unspecified, without bleeding: Secondary | ICD-10-CM | POA: Diagnosis not present

## 2014-03-13 DIAGNOSIS — R7989 Other specified abnormal findings of blood chemistry: Secondary | ICD-10-CM | POA: Diagnosis not present

## 2014-03-13 DIAGNOSIS — D509 Iron deficiency anemia, unspecified: Secondary | ICD-10-CM | POA: Diagnosis not present

## 2014-03-13 DIAGNOSIS — D72821 Monocytosis (symptomatic): Secondary | ICD-10-CM | POA: Diagnosis not present

## 2014-03-13 DIAGNOSIS — R634 Abnormal weight loss: Secondary | ICD-10-CM | POA: Diagnosis not present

## 2014-03-13 DIAGNOSIS — K219 Gastro-esophageal reflux disease without esophagitis: Secondary | ICD-10-CM | POA: Diagnosis not present

## 2014-03-13 DIAGNOSIS — I10 Essential (primary) hypertension: Secondary | ICD-10-CM | POA: Diagnosis not present

## 2014-03-13 DIAGNOSIS — K2990 Gastroduodenitis, unspecified, without bleeding: Secondary | ICD-10-CM | POA: Diagnosis not present

## 2014-03-13 DIAGNOSIS — I498 Other specified cardiac arrhythmias: Secondary | ICD-10-CM | POA: Diagnosis not present

## 2014-03-13 DIAGNOSIS — C9210 Chronic myeloid leukemia, BCR/ABL-positive, not having achieved remission: Secondary | ICD-10-CM | POA: Diagnosis not present

## 2014-03-13 DIAGNOSIS — I509 Heart failure, unspecified: Secondary | ICD-10-CM | POA: Diagnosis not present

## 2014-03-13 DIAGNOSIS — Z79899 Other long term (current) drug therapy: Secondary | ICD-10-CM | POA: Diagnosis not present

## 2014-03-13 DIAGNOSIS — R109 Unspecified abdominal pain: Secondary | ICD-10-CM | POA: Diagnosis not present

## 2014-03-13 DIAGNOSIS — R5381 Other malaise: Secondary | ICD-10-CM | POA: Diagnosis not present

## 2014-03-13 DIAGNOSIS — D693 Immune thrombocytopenic purpura: Secondary | ICD-10-CM | POA: Diagnosis not present

## 2014-03-13 DIAGNOSIS — N179 Acute kidney failure, unspecified: Secondary | ICD-10-CM | POA: Diagnosis not present

## 2014-03-13 DIAGNOSIS — Z86010 Personal history of colonic polyps: Secondary | ICD-10-CM | POA: Diagnosis not present

## 2014-03-13 DIAGNOSIS — D696 Thrombocytopenia, unspecified: Secondary | ICD-10-CM | POA: Diagnosis not present

## 2014-03-13 DIAGNOSIS — B370 Candidal stomatitis: Secondary | ICD-10-CM | POA: Diagnosis not present

## 2014-03-14 DIAGNOSIS — C9210 Chronic myeloid leukemia, BCR/ABL-positive, not having achieved remission: Secondary | ICD-10-CM | POA: Diagnosis not present

## 2014-03-14 DIAGNOSIS — D509 Iron deficiency anemia, unspecified: Secondary | ICD-10-CM | POA: Diagnosis not present

## 2014-03-14 DIAGNOSIS — R7989 Other specified abnormal findings of blood chemistry: Secondary | ICD-10-CM | POA: Diagnosis not present

## 2014-03-14 DIAGNOSIS — D693 Immune thrombocytopenic purpura: Secondary | ICD-10-CM | POA: Diagnosis not present

## 2014-03-14 LAB — CBC CANCER CENTER
Basophil #: 0.2 x10 3/mm — ABNORMAL HIGH (ref 0.0–0.1)
Basophil %: 1.8 %
Eosinophil #: 0.1 x10 3/mm (ref 0.0–0.7)
Eosinophil %: 1.1 %
HCT: 28 % — ABNORMAL LOW (ref 35.0–47.0)
HGB: 8.9 g/dL — ABNORMAL LOW (ref 12.0–16.0)
Lymphocyte #: 0.8 x10 3/mm — ABNORMAL LOW (ref 1.0–3.6)
Lymphocyte %: 7.3 %
MCH: 24.9 pg — ABNORMAL LOW (ref 26.0–34.0)
MCHC: 31.8 g/dL — ABNORMAL LOW (ref 32.0–36.0)
MCV: 78 fL — ABNORMAL LOW (ref 80–100)
Monocyte #: 3.4 x10 3/mm — ABNORMAL HIGH (ref 0.2–0.9)
Monocyte %: 31.4 %
Neutrophil #: 6.3 x10 3/mm (ref 1.4–6.5)
Neutrophil %: 58.4 %
Platelet: 157 x10 3/mm (ref 150–440)
RBC: 3.57 10*6/uL — ABNORMAL LOW (ref 3.80–5.20)
RDW: 19 % — AB (ref 11.5–14.5)
WBC: 10.7 x10 3/mm (ref 3.6–11.0)

## 2014-03-14 LAB — BASIC METABOLIC PANEL
Anion Gap: 8 (ref 7–16)
BUN: 35 mg/dL — AB (ref 7–18)
CO2: 28 mmol/L (ref 21–32)
CREATININE: 1.75 mg/dL — AB (ref 0.60–1.30)
Calcium, Total: 8.8 mg/dL (ref 8.5–10.1)
Chloride: 104 mmol/L (ref 98–107)
EGFR (Non-African Amer.): 29 — ABNORMAL LOW
GFR CALC AF AMER: 33 — AB
Glucose: 86 mg/dL (ref 65–99)
Osmolality: 287 (ref 275–301)
Potassium: 4.3 mmol/L (ref 3.5–5.1)
Sodium: 140 mmol/L (ref 136–145)

## 2014-03-16 LAB — CREATININE, SERUM
CREATININE: 1.83 mg/dL — AB (ref 0.60–1.30)
EGFR (African American): 32 — ABNORMAL LOW
GFR CALC NON AF AMER: 27 — AB

## 2014-03-16 LAB — HEMOGLOBIN: HGB: 8.9 g/dL — AB (ref 12.0–16.0)

## 2014-03-21 DIAGNOSIS — R7989 Other specified abnormal findings of blood chemistry: Secondary | ICD-10-CM | POA: Diagnosis not present

## 2014-03-21 DIAGNOSIS — D693 Immune thrombocytopenic purpura: Secondary | ICD-10-CM | POA: Diagnosis not present

## 2014-03-21 DIAGNOSIS — C9210 Chronic myeloid leukemia, BCR/ABL-positive, not having achieved remission: Secondary | ICD-10-CM | POA: Diagnosis not present

## 2014-03-21 DIAGNOSIS — D509 Iron deficiency anemia, unspecified: Secondary | ICD-10-CM | POA: Diagnosis not present

## 2014-03-21 LAB — CREATININE, SERUM
Creatinine: 1.71 mg/dL — ABNORMAL HIGH (ref 0.60–1.30)
EGFR (African American): 34 — ABNORMAL LOW
EGFR (Non-African Amer.): 30 — ABNORMAL LOW

## 2014-03-21 LAB — OCCULT BLOOD X 1 CARD TO LAB, STOOL
OCCULT BLOOD, FECES: NEGATIVE
OCCULT BLOOD, FECES: NEGATIVE
Occult Blood, Feces: NEGATIVE

## 2014-03-21 LAB — HEMOGLOBIN: HGB: 9 g/dL — AB (ref 12.0–16.0)

## 2014-03-28 DIAGNOSIS — R7989 Other specified abnormal findings of blood chemistry: Secondary | ICD-10-CM | POA: Diagnosis not present

## 2014-03-28 DIAGNOSIS — N19 Unspecified kidney failure: Secondary | ICD-10-CM | POA: Diagnosis not present

## 2014-03-28 DIAGNOSIS — D693 Immune thrombocytopenic purpura: Secondary | ICD-10-CM | POA: Diagnosis not present

## 2014-03-28 DIAGNOSIS — C9210 Chronic myeloid leukemia, BCR/ABL-positive, not having achieved remission: Secondary | ICD-10-CM | POA: Diagnosis not present

## 2014-03-28 DIAGNOSIS — D509 Iron deficiency anemia, unspecified: Secondary | ICD-10-CM | POA: Diagnosis not present

## 2014-03-28 LAB — CREATININE, SERUM
Creatinine: 2.02 mg/dL — ABNORMAL HIGH (ref 0.60–1.30)
EGFR (African American): 28 — ABNORMAL LOW
GFR CALC NON AF AMER: 24 — AB

## 2014-03-28 LAB — POTASSIUM: Potassium: 4.1 mmol/L (ref 3.5–5.1)

## 2014-03-28 LAB — HEMOGLOBIN: HGB: 8.8 g/dL — ABNORMAL LOW (ref 12.0–16.0)

## 2014-03-30 LAB — PROT IMMUNOELECTROPHORES(ARMC)

## 2014-03-30 LAB — KAPPA/LAMBDA FREE LIGHT CHAINS (ARMC)

## 2014-04-11 DIAGNOSIS — D509 Iron deficiency anemia, unspecified: Secondary | ICD-10-CM | POA: Diagnosis not present

## 2014-04-11 DIAGNOSIS — C9210 Chronic myeloid leukemia, BCR/ABL-positive, not having achieved remission: Secondary | ICD-10-CM | POA: Diagnosis not present

## 2014-04-11 DIAGNOSIS — D693 Immune thrombocytopenic purpura: Secondary | ICD-10-CM | POA: Diagnosis not present

## 2014-04-11 DIAGNOSIS — R7989 Other specified abnormal findings of blood chemistry: Secondary | ICD-10-CM | POA: Diagnosis not present

## 2014-04-11 LAB — CBC CANCER CENTER
BASOS ABS: 1 %
Eosinophil: 1 %
HCT: 28.5 % — ABNORMAL LOW (ref 35.0–47.0)
HGB: 9 g/dL — ABNORMAL LOW (ref 12.0–16.0)
Lymphocytes: 1 %
MCH: 25.1 pg — ABNORMAL LOW (ref 26.0–34.0)
MCHC: 31.4 g/dL — AB (ref 32.0–36.0)
MCV: 80 fL (ref 80–100)
Monocytes: 17 %
PLATELETS: 138 x10 3/mm — AB (ref 150–440)
RBC: 3.57 10*6/uL — ABNORMAL LOW (ref 3.80–5.20)
RDW: 20.4 % — ABNORMAL HIGH (ref 11.5–14.5)
Segmented Neutrophils: 80 %
WBC: 12.5 x10 3/mm — ABNORMAL HIGH (ref 3.6–11.0)

## 2014-04-11 LAB — CREATININE, SERUM
Creatinine: 1.83 mg/dL — ABNORMAL HIGH (ref 0.60–1.30)
EGFR (Non-African Amer.): 27 — ABNORMAL LOW
GFR CALC AF AMER: 32 — AB

## 2014-04-11 LAB — POTASSIUM: POTASSIUM: 4.1 mmol/L (ref 3.5–5.1)

## 2014-04-12 ENCOUNTER — Ambulatory Visit: Payer: Self-pay | Admitting: Internal Medicine

## 2014-05-15 ENCOUNTER — Ambulatory Visit: Payer: Self-pay | Admitting: Internal Medicine

## 2014-05-15 DIAGNOSIS — R319 Hematuria, unspecified: Secondary | ICD-10-CM | POA: Diagnosis not present

## 2014-05-15 DIAGNOSIS — C9210 Chronic myeloid leukemia, BCR/ABL-positive, not having achieved remission: Secondary | ICD-10-CM | POA: Diagnosis not present

## 2014-05-15 DIAGNOSIS — Z86010 Personal history of colonic polyps: Secondary | ICD-10-CM | POA: Diagnosis not present

## 2014-05-15 DIAGNOSIS — I10 Essential (primary) hypertension: Secondary | ICD-10-CM | POA: Diagnosis not present

## 2014-05-15 DIAGNOSIS — R5381 Other malaise: Secondary | ICD-10-CM | POA: Diagnosis not present

## 2014-05-15 DIAGNOSIS — E785 Hyperlipidemia, unspecified: Secondary | ICD-10-CM | POA: Diagnosis not present

## 2014-05-15 DIAGNOSIS — D509 Iron deficiency anemia, unspecified: Secondary | ICD-10-CM | POA: Diagnosis not present

## 2014-05-15 DIAGNOSIS — R110 Nausea: Secondary | ICD-10-CM | POA: Diagnosis not present

## 2014-05-15 DIAGNOSIS — R630 Anorexia: Secondary | ICD-10-CM | POA: Diagnosis not present

## 2014-05-15 DIAGNOSIS — K219 Gastro-esophageal reflux disease without esophagitis: Secondary | ICD-10-CM | POA: Diagnosis not present

## 2014-05-15 DIAGNOSIS — N19 Unspecified kidney failure: Secondary | ICD-10-CM | POA: Diagnosis not present

## 2014-05-15 DIAGNOSIS — Z79899 Other long term (current) drug therapy: Secondary | ICD-10-CM | POA: Diagnosis not present

## 2014-05-15 DIAGNOSIS — D693 Immune thrombocytopenic purpura: Secondary | ICD-10-CM | POA: Diagnosis not present

## 2014-05-15 DIAGNOSIS — R7989 Other specified abnormal findings of blood chemistry: Secondary | ICD-10-CM | POA: Diagnosis not present

## 2014-05-15 DIAGNOSIS — D696 Thrombocytopenia, unspecified: Secondary | ICD-10-CM | POA: Diagnosis not present

## 2014-05-15 DIAGNOSIS — R634 Abnormal weight loss: Secondary | ICD-10-CM | POA: Diagnosis not present

## 2014-05-15 DIAGNOSIS — I509 Heart failure, unspecified: Secondary | ICD-10-CM | POA: Diagnosis not present

## 2014-05-15 DIAGNOSIS — R0602 Shortness of breath: Secondary | ICD-10-CM | POA: Diagnosis not present

## 2014-05-15 LAB — CBC CANCER CENTER
BASOS ABS: 2 %
HCT: 27.2 % — ABNORMAL LOW (ref 35.0–47.0)
HGB: 8.6 g/dL — ABNORMAL LOW (ref 12.0–16.0)
LYMPHS PCT: 9 %
MCH: 25.1 pg — ABNORMAL LOW (ref 26.0–34.0)
MCHC: 31.6 g/dL — AB (ref 32.0–36.0)
MCV: 80 fL (ref 80–100)
Monocytes: 15 %
Platelet: 171 x10 3/mm (ref 150–440)
RBC: 3.41 10*6/uL — AB (ref 3.80–5.20)
RDW: 20.2 % — ABNORMAL HIGH (ref 11.5–14.5)
Segmented Neutrophils: 74 %
WBC: 15.5 x10 3/mm — AB (ref 3.6–11.0)

## 2014-05-15 LAB — CREATININE, SERUM
CREATININE: 1.76 mg/dL — AB (ref 0.60–1.30)
EGFR (African American): 33 — ABNORMAL LOW
EGFR (Non-African Amer.): 29 — ABNORMAL LOW

## 2014-05-15 LAB — FERRITIN: Ferritin (ARMC): 391 ng/mL — ABNORMAL HIGH (ref 8–388)

## 2014-05-23 DIAGNOSIS — K219 Gastro-esophageal reflux disease without esophagitis: Secondary | ICD-10-CM | POA: Insufficient documentation

## 2014-05-30 DIAGNOSIS — D509 Iron deficiency anemia, unspecified: Secondary | ICD-10-CM | POA: Diagnosis not present

## 2014-05-30 DIAGNOSIS — D693 Immune thrombocytopenic purpura: Secondary | ICD-10-CM | POA: Diagnosis not present

## 2014-05-30 DIAGNOSIS — R634 Abnormal weight loss: Secondary | ICD-10-CM | POA: Diagnosis not present

## 2014-05-30 DIAGNOSIS — Z79899 Other long term (current) drug therapy: Secondary | ICD-10-CM | POA: Diagnosis not present

## 2014-05-30 DIAGNOSIS — R7989 Other specified abnormal findings of blood chemistry: Secondary | ICD-10-CM | POA: Diagnosis not present

## 2014-05-30 DIAGNOSIS — C9210 Chronic myeloid leukemia, BCR/ABL-positive, not having achieved remission: Secondary | ICD-10-CM | POA: Diagnosis not present

## 2014-05-30 LAB — CBC CANCER CENTER
BANDS NEUTROPHIL: 1 %
Eosinophil: 2 %
HCT: 27.1 % — ABNORMAL LOW (ref 35.0–47.0)
HGB: 8.4 g/dL — ABNORMAL LOW (ref 12.0–16.0)
Lymphocytes: 10 %
MCH: 24.3 pg — ABNORMAL LOW (ref 26.0–34.0)
MCHC: 30.8 g/dL — ABNORMAL LOW (ref 32.0–36.0)
MCV: 79 fL — ABNORMAL LOW (ref 80–100)
Monocytes: 28 %
Platelet: 163 x10 3/mm (ref 150–440)
RBC: 3.44 10*6/uL — ABNORMAL LOW (ref 3.80–5.20)
RDW: 19.2 % — ABNORMAL HIGH (ref 11.5–14.5)
Segmented Neutrophils: 59 %
WBC: 14.8 x10 3/mm — ABNORMAL HIGH (ref 3.6–11.0)

## 2014-05-30 LAB — IRON AND TIBC
IRON SATURATION: 24 %
IRON: 35 ug/dL — AB (ref 50–170)
Iron Bind.Cap.(Total): 148 ug/dL — ABNORMAL LOW (ref 250–450)
Unbound Iron-Bind.Cap.: 113 ug/dL

## 2014-05-30 LAB — FERRITIN: Ferritin (ARMC): 402 ng/mL — ABNORMAL HIGH (ref 8–388)

## 2014-05-30 LAB — CREATININE, SERUM
Creatinine: 1.99 mg/dL — ABNORMAL HIGH (ref 0.60–1.30)
EGFR (African American): 29 — ABNORMAL LOW
GFR CALC NON AF AMER: 25 — AB

## 2014-05-31 LAB — KAPPA/LAMBDA FREE LIGHT CHAINS (ARMC)

## 2014-06-06 DIAGNOSIS — C9210 Chronic myeloid leukemia, BCR/ABL-positive, not having achieved remission: Secondary | ICD-10-CM | POA: Diagnosis not present

## 2014-06-06 DIAGNOSIS — D509 Iron deficiency anemia, unspecified: Secondary | ICD-10-CM | POA: Diagnosis not present

## 2014-06-06 DIAGNOSIS — R7989 Other specified abnormal findings of blood chemistry: Secondary | ICD-10-CM | POA: Diagnosis not present

## 2014-06-06 DIAGNOSIS — D693 Immune thrombocytopenic purpura: Secondary | ICD-10-CM | POA: Diagnosis not present

## 2014-06-06 DIAGNOSIS — Z79899 Other long term (current) drug therapy: Secondary | ICD-10-CM | POA: Diagnosis not present

## 2014-06-06 DIAGNOSIS — R634 Abnormal weight loss: Secondary | ICD-10-CM | POA: Diagnosis not present

## 2014-06-06 LAB — CREATININE, SERUM
CREATININE: 1.8 mg/dL — AB (ref 0.60–1.30)
GFR CALC AF AMER: 32 — AB
GFR CALC NON AF AMER: 28 — AB

## 2014-06-06 LAB — CANCER CENTER HEMOGLOBIN: HGB: 8.2 g/dL — ABNORMAL LOW (ref 12.0–16.0)

## 2014-06-13 ENCOUNTER — Ambulatory Visit: Payer: Self-pay | Admitting: Internal Medicine

## 2014-06-13 DIAGNOSIS — R5381 Other malaise: Secondary | ICD-10-CM | POA: Diagnosis not present

## 2014-06-13 DIAGNOSIS — R7989 Other specified abnormal findings of blood chemistry: Secondary | ICD-10-CM | POA: Diagnosis not present

## 2014-06-13 DIAGNOSIS — R0602 Shortness of breath: Secondary | ICD-10-CM | POA: Diagnosis not present

## 2014-06-13 DIAGNOSIS — Z79899 Other long term (current) drug therapy: Secondary | ICD-10-CM | POA: Diagnosis not present

## 2014-06-13 DIAGNOSIS — I10 Essential (primary) hypertension: Secondary | ICD-10-CM | POA: Diagnosis not present

## 2014-06-13 DIAGNOSIS — R5383 Other fatigue: Secondary | ICD-10-CM | POA: Diagnosis not present

## 2014-06-13 DIAGNOSIS — R634 Abnormal weight loss: Secondary | ICD-10-CM | POA: Diagnosis not present

## 2014-06-13 DIAGNOSIS — D696 Thrombocytopenia, unspecified: Secondary | ICD-10-CM | POA: Diagnosis not present

## 2014-06-13 DIAGNOSIS — E785 Hyperlipidemia, unspecified: Secondary | ICD-10-CM | POA: Diagnosis not present

## 2014-06-13 DIAGNOSIS — D509 Iron deficiency anemia, unspecified: Secondary | ICD-10-CM | POA: Diagnosis not present

## 2014-06-13 DIAGNOSIS — I509 Heart failure, unspecified: Secondary | ICD-10-CM | POA: Diagnosis not present

## 2014-06-13 DIAGNOSIS — R630 Anorexia: Secondary | ICD-10-CM | POA: Diagnosis not present

## 2014-06-13 DIAGNOSIS — N19 Unspecified kidney failure: Secondary | ICD-10-CM | POA: Diagnosis not present

## 2014-06-13 DIAGNOSIS — K219 Gastro-esophageal reflux disease without esophagitis: Secondary | ICD-10-CM | POA: Diagnosis not present

## 2014-06-13 DIAGNOSIS — Z86010 Personal history of colonic polyps: Secondary | ICD-10-CM | POA: Diagnosis not present

## 2014-06-13 DIAGNOSIS — D693 Immune thrombocytopenic purpura: Secondary | ICD-10-CM | POA: Diagnosis not present

## 2014-06-13 DIAGNOSIS — C9210 Chronic myeloid leukemia, BCR/ABL-positive, not having achieved remission: Secondary | ICD-10-CM | POA: Diagnosis not present

## 2014-06-13 LAB — CANCER CENTER HEMOGLOBIN: HGB: 9.7 g/dL — ABNORMAL LOW

## 2014-06-16 LAB — BILIRUBIN, TOTAL: Bilirubin,Total: 0.3 mg/dL (ref 0.2–1.0)

## 2014-06-16 LAB — LACTATE DEHYDROGENASE: LDH: 235 U/L (ref 81–246)

## 2014-06-16 LAB — CANCER CENTER HEMOGLOBIN: HGB: 8.7 g/dL — ABNORMAL LOW (ref 12.0–16.0)

## 2014-06-20 DIAGNOSIS — R7989 Other specified abnormal findings of blood chemistry: Secondary | ICD-10-CM | POA: Diagnosis not present

## 2014-06-20 DIAGNOSIS — D693 Immune thrombocytopenic purpura: Secondary | ICD-10-CM | POA: Diagnosis not present

## 2014-06-20 DIAGNOSIS — D509 Iron deficiency anemia, unspecified: Secondary | ICD-10-CM | POA: Diagnosis not present

## 2014-06-20 DIAGNOSIS — C9210 Chronic myeloid leukemia, BCR/ABL-positive, not having achieved remission: Secondary | ICD-10-CM | POA: Diagnosis not present

## 2014-06-20 LAB — CREATININE, SERUM
Creatinine: 1.64 mg/dL — ABNORMAL HIGH (ref 0.60–1.30)
EGFR (African American): 36 — ABNORMAL LOW
EGFR (Non-African Amer.): 31 — ABNORMAL LOW

## 2014-06-20 LAB — CANCER CENTER HEMOGLOBIN: HGB: 10.1 g/dL — ABNORMAL LOW (ref 12.0–16.0)

## 2014-06-23 LAB — FERRITIN: Ferritin (ARMC): 291 ng/mL (ref 8–388)

## 2014-06-23 LAB — CANCER CENTER HEMOGLOBIN: HGB: 9.8 g/dL — AB (ref 12.0–16.0)

## 2014-06-28 LAB — CANCER CENTER HEMOGLOBIN: HGB: 9.8 g/dL — ABNORMAL LOW (ref 12.0–16.0)

## 2014-07-04 DIAGNOSIS — D693 Immune thrombocytopenic purpura: Secondary | ICD-10-CM | POA: Diagnosis not present

## 2014-07-04 DIAGNOSIS — D509 Iron deficiency anemia, unspecified: Secondary | ICD-10-CM | POA: Diagnosis not present

## 2014-07-04 DIAGNOSIS — C9210 Chronic myeloid leukemia, BCR/ABL-positive, not having achieved remission: Secondary | ICD-10-CM | POA: Diagnosis not present

## 2014-07-04 DIAGNOSIS — R7989 Other specified abnormal findings of blood chemistry: Secondary | ICD-10-CM | POA: Diagnosis not present

## 2014-07-04 LAB — CANCER CENTER HEMOGLOBIN: HGB: 9.7 g/dL — ABNORMAL LOW (ref 12.0–16.0)

## 2014-07-13 ENCOUNTER — Ambulatory Visit: Payer: Self-pay | Admitting: Internal Medicine

## 2014-07-13 DIAGNOSIS — K2970 Gastritis, unspecified, without bleeding: Secondary | ICD-10-CM | POA: Diagnosis not present

## 2014-07-13 DIAGNOSIS — R0602 Shortness of breath: Secondary | ICD-10-CM | POA: Diagnosis not present

## 2014-07-13 DIAGNOSIS — N19 Unspecified kidney failure: Secondary | ICD-10-CM | POA: Diagnosis not present

## 2014-07-13 DIAGNOSIS — R634 Abnormal weight loss: Secondary | ICD-10-CM | POA: Diagnosis not present

## 2014-07-13 DIAGNOSIS — Z79899 Other long term (current) drug therapy: Secondary | ICD-10-CM | POA: Diagnosis not present

## 2014-07-13 DIAGNOSIS — C9220 Atypical chronic myeloid leukemia, BCR/ABL-negative, not having achieved remission: Secondary | ICD-10-CM | POA: Diagnosis not present

## 2014-07-13 DIAGNOSIS — R7989 Other specified abnormal findings of blood chemistry: Secondary | ICD-10-CM | POA: Diagnosis not present

## 2014-07-13 DIAGNOSIS — E780 Pure hypercholesterolemia: Secondary | ICD-10-CM | POA: Diagnosis not present

## 2014-07-13 DIAGNOSIS — E611 Iron deficiency: Secondary | ICD-10-CM | POA: Diagnosis not present

## 2014-07-13 DIAGNOSIS — D649 Anemia, unspecified: Secondary | ICD-10-CM | POA: Diagnosis not present

## 2014-07-13 DIAGNOSIS — D693 Immune thrombocytopenic purpura: Secondary | ICD-10-CM | POA: Diagnosis not present

## 2014-07-13 DIAGNOSIS — I10 Essential (primary) hypertension: Secondary | ICD-10-CM | POA: Diagnosis not present

## 2014-07-18 DIAGNOSIS — D693 Immune thrombocytopenic purpura: Secondary | ICD-10-CM | POA: Diagnosis not present

## 2014-07-18 DIAGNOSIS — C9220 Atypical chronic myeloid leukemia, BCR/ABL-negative, not having achieved remission: Secondary | ICD-10-CM | POA: Diagnosis not present

## 2014-07-18 LAB — CBC CANCER CENTER
Basophil: 3 %
Eosinophil: 1 %
HCT: 31.1 % — ABNORMAL LOW (ref 35.0–47.0)
HGB: 9.7 g/dL — AB (ref 12.0–16.0)
Lymphocytes: 9 %
MCH: 24.2 pg — ABNORMAL LOW (ref 26.0–34.0)
MCHC: 31.4 g/dL — AB (ref 32.0–36.0)
MCV: 77 fL — ABNORMAL LOW (ref 80–100)
Monocytes: 26 %
PLATELETS: 113 x10 3/mm — AB (ref 150–440)
RBC: 4.02 10*6/uL (ref 3.80–5.20)
RDW: 19.8 % — ABNORMAL HIGH (ref 11.5–14.5)
SEGMENTED NEUTROPHILS: 61 %
WBC: 11.8 x10 3/mm — AB (ref 3.6–11.0)

## 2014-08-03 DIAGNOSIS — M5412 Radiculopathy, cervical region: Secondary | ICD-10-CM | POA: Diagnosis not present

## 2014-08-03 DIAGNOSIS — M5136 Other intervertebral disc degeneration, lumbar region: Secondary | ICD-10-CM | POA: Diagnosis not present

## 2014-08-03 DIAGNOSIS — M62830 Muscle spasm of back: Secondary | ICD-10-CM | POA: Diagnosis not present

## 2014-08-03 DIAGNOSIS — M9901 Segmental and somatic dysfunction of cervical region: Secondary | ICD-10-CM | POA: Diagnosis not present

## 2014-08-03 DIAGNOSIS — M9903 Segmental and somatic dysfunction of lumbar region: Secondary | ICD-10-CM | POA: Diagnosis not present

## 2014-08-03 DIAGNOSIS — M9902 Segmental and somatic dysfunction of thoracic region: Secondary | ICD-10-CM | POA: Diagnosis not present

## 2014-08-03 LAB — CBC CANCER CENTER
Basophil: 1 %
EOS PCT: 2 %
HCT: 30.3 % — ABNORMAL LOW (ref 35.0–47.0)
HGB: 9.6 g/dL — ABNORMAL LOW (ref 12.0–16.0)
Lymphocytes: 21 %
MCH: 24.7 pg — ABNORMAL LOW (ref 26.0–34.0)
MCHC: 31.6 g/dL — AB (ref 32.0–36.0)
MCV: 78 fL — ABNORMAL LOW (ref 80–100)
MONOS PCT: 24 %
Platelet: 75 x10 3/mm — ABNORMAL LOW (ref 150–440)
RBC: 3.88 10*6/uL (ref 3.80–5.20)
RDW: 22 % — ABNORMAL HIGH (ref 11.5–14.5)
Segmented Neutrophils: 52 %
WBC: 10.5 x10 3/mm (ref 3.6–11.0)

## 2014-08-07 DIAGNOSIS — M9903 Segmental and somatic dysfunction of lumbar region: Secondary | ICD-10-CM | POA: Diagnosis not present

## 2014-08-07 DIAGNOSIS — M9902 Segmental and somatic dysfunction of thoracic region: Secondary | ICD-10-CM | POA: Diagnosis not present

## 2014-08-07 DIAGNOSIS — M5136 Other intervertebral disc degeneration, lumbar region: Secondary | ICD-10-CM | POA: Diagnosis not present

## 2014-08-07 DIAGNOSIS — M9901 Segmental and somatic dysfunction of cervical region: Secondary | ICD-10-CM | POA: Diagnosis not present

## 2014-08-07 DIAGNOSIS — M5412 Radiculopathy, cervical region: Secondary | ICD-10-CM | POA: Diagnosis not present

## 2014-08-07 DIAGNOSIS — M62830 Muscle spasm of back: Secondary | ICD-10-CM | POA: Diagnosis not present

## 2014-08-09 DIAGNOSIS — M9902 Segmental and somatic dysfunction of thoracic region: Secondary | ICD-10-CM | POA: Diagnosis not present

## 2014-08-09 DIAGNOSIS — M5136 Other intervertebral disc degeneration, lumbar region: Secondary | ICD-10-CM | POA: Diagnosis not present

## 2014-08-09 DIAGNOSIS — M9901 Segmental and somatic dysfunction of cervical region: Secondary | ICD-10-CM | POA: Diagnosis not present

## 2014-08-09 DIAGNOSIS — M62830 Muscle spasm of back: Secondary | ICD-10-CM | POA: Diagnosis not present

## 2014-08-09 DIAGNOSIS — M5412 Radiculopathy, cervical region: Secondary | ICD-10-CM | POA: Diagnosis not present

## 2014-08-09 DIAGNOSIS — M9903 Segmental and somatic dysfunction of lumbar region: Secondary | ICD-10-CM | POA: Diagnosis not present

## 2014-08-10 DIAGNOSIS — M5412 Radiculopathy, cervical region: Secondary | ICD-10-CM | POA: Diagnosis not present

## 2014-08-10 DIAGNOSIS — M9902 Segmental and somatic dysfunction of thoracic region: Secondary | ICD-10-CM | POA: Diagnosis not present

## 2014-08-10 DIAGNOSIS — M5136 Other intervertebral disc degeneration, lumbar region: Secondary | ICD-10-CM | POA: Diagnosis not present

## 2014-08-10 DIAGNOSIS — M9903 Segmental and somatic dysfunction of lumbar region: Secondary | ICD-10-CM | POA: Diagnosis not present

## 2014-08-10 DIAGNOSIS — M62830 Muscle spasm of back: Secondary | ICD-10-CM | POA: Diagnosis not present

## 2014-08-10 DIAGNOSIS — M9901 Segmental and somatic dysfunction of cervical region: Secondary | ICD-10-CM | POA: Diagnosis not present

## 2014-08-13 ENCOUNTER — Ambulatory Visit: Payer: Self-pay | Admitting: Internal Medicine

## 2014-08-14 DIAGNOSIS — M62830 Muscle spasm of back: Secondary | ICD-10-CM | POA: Diagnosis not present

## 2014-08-14 DIAGNOSIS — M9903 Segmental and somatic dysfunction of lumbar region: Secondary | ICD-10-CM | POA: Diagnosis not present

## 2014-08-14 DIAGNOSIS — M9902 Segmental and somatic dysfunction of thoracic region: Secondary | ICD-10-CM | POA: Diagnosis not present

## 2014-08-14 DIAGNOSIS — M9901 Segmental and somatic dysfunction of cervical region: Secondary | ICD-10-CM | POA: Diagnosis not present

## 2014-08-14 DIAGNOSIS — M5412 Radiculopathy, cervical region: Secondary | ICD-10-CM | POA: Diagnosis not present

## 2014-08-14 DIAGNOSIS — M5136 Other intervertebral disc degeneration, lumbar region: Secondary | ICD-10-CM | POA: Diagnosis not present

## 2014-08-16 DIAGNOSIS — M9901 Segmental and somatic dysfunction of cervical region: Secondary | ICD-10-CM | POA: Diagnosis not present

## 2014-08-16 DIAGNOSIS — M5136 Other intervertebral disc degeneration, lumbar region: Secondary | ICD-10-CM | POA: Diagnosis not present

## 2014-08-16 DIAGNOSIS — M9903 Segmental and somatic dysfunction of lumbar region: Secondary | ICD-10-CM | POA: Diagnosis not present

## 2014-08-16 DIAGNOSIS — M9902 Segmental and somatic dysfunction of thoracic region: Secondary | ICD-10-CM | POA: Diagnosis not present

## 2014-08-16 DIAGNOSIS — M62830 Muscle spasm of back: Secondary | ICD-10-CM | POA: Diagnosis not present

## 2014-08-16 DIAGNOSIS — M5412 Radiculopathy, cervical region: Secondary | ICD-10-CM | POA: Diagnosis not present

## 2014-08-18 DIAGNOSIS — M5412 Radiculopathy, cervical region: Secondary | ICD-10-CM | POA: Diagnosis not present

## 2014-08-18 DIAGNOSIS — M9902 Segmental and somatic dysfunction of thoracic region: Secondary | ICD-10-CM | POA: Diagnosis not present

## 2014-08-18 DIAGNOSIS — M62830 Muscle spasm of back: Secondary | ICD-10-CM | POA: Diagnosis not present

## 2014-08-18 DIAGNOSIS — M9901 Segmental and somatic dysfunction of cervical region: Secondary | ICD-10-CM | POA: Diagnosis not present

## 2014-08-18 DIAGNOSIS — M5136 Other intervertebral disc degeneration, lumbar region: Secondary | ICD-10-CM | POA: Diagnosis not present

## 2014-08-18 DIAGNOSIS — M9903 Segmental and somatic dysfunction of lumbar region: Secondary | ICD-10-CM | POA: Diagnosis not present

## 2014-08-21 DIAGNOSIS — M5136 Other intervertebral disc degeneration, lumbar region: Secondary | ICD-10-CM | POA: Diagnosis not present

## 2014-08-21 DIAGNOSIS — M9901 Segmental and somatic dysfunction of cervical region: Secondary | ICD-10-CM | POA: Diagnosis not present

## 2014-08-21 DIAGNOSIS — M9903 Segmental and somatic dysfunction of lumbar region: Secondary | ICD-10-CM | POA: Diagnosis not present

## 2014-08-21 DIAGNOSIS — M9902 Segmental and somatic dysfunction of thoracic region: Secondary | ICD-10-CM | POA: Diagnosis not present

## 2014-08-21 DIAGNOSIS — M62830 Muscle spasm of back: Secondary | ICD-10-CM | POA: Diagnosis not present

## 2014-08-21 DIAGNOSIS — M5412 Radiculopathy, cervical region: Secondary | ICD-10-CM | POA: Diagnosis not present

## 2014-08-22 ENCOUNTER — Ambulatory Visit: Payer: Self-pay | Admitting: Internal Medicine

## 2014-08-22 DIAGNOSIS — N19 Unspecified kidney failure: Secondary | ICD-10-CM | POA: Diagnosis not present

## 2014-08-22 DIAGNOSIS — C9210 Chronic myeloid leukemia, BCR/ABL-positive, not having achieved remission: Secondary | ICD-10-CM | POA: Diagnosis not present

## 2014-08-22 DIAGNOSIS — E780 Pure hypercholesterolemia: Secondary | ICD-10-CM | POA: Diagnosis not present

## 2014-08-22 DIAGNOSIS — I10 Essential (primary) hypertension: Secondary | ICD-10-CM | POA: Diagnosis not present

## 2014-08-22 DIAGNOSIS — D693 Immune thrombocytopenic purpura: Secondary | ICD-10-CM | POA: Diagnosis not present

## 2014-08-22 DIAGNOSIS — Z79899 Other long term (current) drug therapy: Secondary | ICD-10-CM | POA: Diagnosis not present

## 2014-08-22 DIAGNOSIS — R7989 Other specified abnormal findings of blood chemistry: Secondary | ICD-10-CM | POA: Diagnosis not present

## 2014-08-22 DIAGNOSIS — D509 Iron deficiency anemia, unspecified: Secondary | ICD-10-CM | POA: Diagnosis not present

## 2014-08-22 DIAGNOSIS — R634 Abnormal weight loss: Secondary | ICD-10-CM | POA: Diagnosis not present

## 2014-08-22 DIAGNOSIS — N281 Cyst of kidney, acquired: Secondary | ICD-10-CM | POA: Diagnosis not present

## 2014-08-22 LAB — CBC CANCER CENTER
EOS PCT: 1 %
HCT: 32.2 % — AB (ref 35.0–47.0)
HGB: 9.9 g/dL — ABNORMAL LOW (ref 12.0–16.0)
LYMPHS PCT: 16 %
MCH: 24 pg — AB (ref 26.0–34.0)
MCHC: 30.6 g/dL — ABNORMAL LOW (ref 32.0–36.0)
MCV: 78 fL — AB (ref 80–100)
MONOS PCT: 19 %
Platelet: 66 x10 3/mm — ABNORMAL LOW (ref 150–440)
RBC: 4.11 10*6/uL (ref 3.80–5.20)
RDW: 22 % — AB (ref 11.5–14.5)
Segmented Neutrophils: 64 %
WBC: 11.2 x10 3/mm — AB (ref 3.6–11.0)

## 2014-08-24 DIAGNOSIS — M5412 Radiculopathy, cervical region: Secondary | ICD-10-CM | POA: Diagnosis not present

## 2014-08-24 DIAGNOSIS — M5136 Other intervertebral disc degeneration, lumbar region: Secondary | ICD-10-CM | POA: Diagnosis not present

## 2014-08-24 DIAGNOSIS — M9901 Segmental and somatic dysfunction of cervical region: Secondary | ICD-10-CM | POA: Diagnosis not present

## 2014-08-24 DIAGNOSIS — M9903 Segmental and somatic dysfunction of lumbar region: Secondary | ICD-10-CM | POA: Diagnosis not present

## 2014-08-28 DIAGNOSIS — M9901 Segmental and somatic dysfunction of cervical region: Secondary | ICD-10-CM | POA: Diagnosis not present

## 2014-08-28 DIAGNOSIS — M5136 Other intervertebral disc degeneration, lumbar region: Secondary | ICD-10-CM | POA: Diagnosis not present

## 2014-08-28 DIAGNOSIS — M9903 Segmental and somatic dysfunction of lumbar region: Secondary | ICD-10-CM | POA: Diagnosis not present

## 2014-08-28 DIAGNOSIS — M5412 Radiculopathy, cervical region: Secondary | ICD-10-CM | POA: Diagnosis not present

## 2014-08-30 ENCOUNTER — Ambulatory Visit: Payer: Self-pay | Admitting: Family Medicine

## 2014-08-30 DIAGNOSIS — Z1231 Encounter for screening mammogram for malignant neoplasm of breast: Secondary | ICD-10-CM | POA: Diagnosis not present

## 2014-08-31 DIAGNOSIS — M5412 Radiculopathy, cervical region: Secondary | ICD-10-CM | POA: Diagnosis not present

## 2014-08-31 DIAGNOSIS — M9901 Segmental and somatic dysfunction of cervical region: Secondary | ICD-10-CM | POA: Diagnosis not present

## 2014-08-31 DIAGNOSIS — M5136 Other intervertebral disc degeneration, lumbar region: Secondary | ICD-10-CM | POA: Diagnosis not present

## 2014-08-31 DIAGNOSIS — M9903 Segmental and somatic dysfunction of lumbar region: Secondary | ICD-10-CM | POA: Diagnosis not present

## 2014-09-12 ENCOUNTER — Ambulatory Visit: Payer: Self-pay | Admitting: Internal Medicine

## 2014-09-12 DIAGNOSIS — R634 Abnormal weight loss: Secondary | ICD-10-CM | POA: Diagnosis not present

## 2014-09-12 DIAGNOSIS — Z86010 Personal history of colonic polyps: Secondary | ICD-10-CM | POA: Diagnosis not present

## 2014-09-12 DIAGNOSIS — Z8719 Personal history of other diseases of the digestive system: Secondary | ICD-10-CM | POA: Diagnosis not present

## 2014-09-12 DIAGNOSIS — N19 Unspecified kidney failure: Secondary | ICD-10-CM | POA: Diagnosis not present

## 2014-09-12 DIAGNOSIS — C9210 Chronic myeloid leukemia, BCR/ABL-positive, not having achieved remission: Secondary | ICD-10-CM | POA: Diagnosis not present

## 2014-09-12 DIAGNOSIS — I10 Essential (primary) hypertension: Secondary | ICD-10-CM | POA: Diagnosis not present

## 2014-09-12 DIAGNOSIS — N281 Cyst of kidney, acquired: Secondary | ICD-10-CM | POA: Diagnosis not present

## 2014-09-12 DIAGNOSIS — E780 Pure hypercholesterolemia: Secondary | ICD-10-CM | POA: Diagnosis not present

## 2014-09-12 DIAGNOSIS — Z8701 Personal history of pneumonia (recurrent): Secondary | ICD-10-CM | POA: Diagnosis not present

## 2014-09-12 DIAGNOSIS — D693 Immune thrombocytopenic purpura: Secondary | ICD-10-CM | POA: Diagnosis not present

## 2014-09-12 DIAGNOSIS — R7989 Other specified abnormal findings of blood chemistry: Secondary | ICD-10-CM | POA: Diagnosis not present

## 2014-09-12 DIAGNOSIS — D72821 Monocytosis (symptomatic): Secondary | ICD-10-CM | POA: Diagnosis not present

## 2014-09-12 DIAGNOSIS — D696 Thrombocytopenia, unspecified: Secondary | ICD-10-CM | POA: Diagnosis not present

## 2014-09-12 DIAGNOSIS — Z79899 Other long term (current) drug therapy: Secondary | ICD-10-CM | POA: Diagnosis not present

## 2014-09-12 DIAGNOSIS — D509 Iron deficiency anemia, unspecified: Secondary | ICD-10-CM | POA: Diagnosis not present

## 2014-09-12 LAB — CBC CANCER CENTER
Eosinophil: 2 %
HCT: 30.6 % — ABNORMAL LOW (ref 35.0–47.0)
HGB: 9.4 g/dL — ABNORMAL LOW (ref 12.0–16.0)
LYMPHS PCT: 12 %
MCH: 23.9 pg — ABNORMAL LOW (ref 26.0–34.0)
MCHC: 30.7 g/dL — ABNORMAL LOW (ref 32.0–36.0)
MCV: 78 fL — AB (ref 80–100)
MONOS PCT: 31 %
Platelet: 88 x10 3/mm — ABNORMAL LOW (ref 150–440)
RBC: 3.92 10*6/uL (ref 3.80–5.20)
RDW: 21.5 % — AB (ref 11.5–14.5)
Segmented Neutrophils: 55 %
WBC: 13.1 x10 3/mm — AB (ref 3.6–11.0)

## 2014-09-12 LAB — CREATININE, SERUM
Creatinine: 1.49 mg/dL — ABNORMAL HIGH (ref 0.60–1.30)
EGFR (African American): 44 — ABNORMAL LOW
GFR CALC NON AF AMER: 37 — AB

## 2014-09-20 DIAGNOSIS — M9903 Segmental and somatic dysfunction of lumbar region: Secondary | ICD-10-CM | POA: Diagnosis not present

## 2014-09-20 DIAGNOSIS — M9901 Segmental and somatic dysfunction of cervical region: Secondary | ICD-10-CM | POA: Diagnosis not present

## 2014-09-20 DIAGNOSIS — M5136 Other intervertebral disc degeneration, lumbar region: Secondary | ICD-10-CM | POA: Diagnosis not present

## 2014-09-20 DIAGNOSIS — M5412 Radiculopathy, cervical region: Secondary | ICD-10-CM | POA: Diagnosis not present

## 2014-10-02 DIAGNOSIS — M5136 Other intervertebral disc degeneration, lumbar region: Secondary | ICD-10-CM | POA: Diagnosis not present

## 2014-10-02 DIAGNOSIS — M9901 Segmental and somatic dysfunction of cervical region: Secondary | ICD-10-CM | POA: Diagnosis not present

## 2014-10-02 DIAGNOSIS — M5412 Radiculopathy, cervical region: Secondary | ICD-10-CM | POA: Diagnosis not present

## 2014-10-02 DIAGNOSIS — M9903 Segmental and somatic dysfunction of lumbar region: Secondary | ICD-10-CM | POA: Diagnosis not present

## 2014-10-13 ENCOUNTER — Ambulatory Visit: Payer: Self-pay | Admitting: Internal Medicine

## 2014-10-13 DIAGNOSIS — D509 Iron deficiency anemia, unspecified: Secondary | ICD-10-CM | POA: Diagnosis not present

## 2014-10-13 DIAGNOSIS — R634 Abnormal weight loss: Secondary | ICD-10-CM | POA: Diagnosis not present

## 2014-10-13 DIAGNOSIS — C9210 Chronic myeloid leukemia, BCR/ABL-positive, not having achieved remission: Secondary | ICD-10-CM | POA: Diagnosis not present

## 2014-10-13 DIAGNOSIS — D72821 Monocytosis (symptomatic): Secondary | ICD-10-CM | POA: Diagnosis not present

## 2014-10-13 DIAGNOSIS — D693 Immune thrombocytopenic purpura: Secondary | ICD-10-CM | POA: Diagnosis not present

## 2014-10-13 DIAGNOSIS — Z86010 Personal history of colonic polyps: Secondary | ICD-10-CM | POA: Diagnosis not present

## 2014-10-13 DIAGNOSIS — R7989 Other specified abnormal findings of blood chemistry: Secondary | ICD-10-CM | POA: Diagnosis not present

## 2014-10-13 DIAGNOSIS — N19 Unspecified kidney failure: Secondary | ICD-10-CM | POA: Diagnosis not present

## 2014-10-13 DIAGNOSIS — E780 Pure hypercholesterolemia: Secondary | ICD-10-CM | POA: Diagnosis not present

## 2014-10-13 DIAGNOSIS — D696 Thrombocytopenia, unspecified: Secondary | ICD-10-CM | POA: Diagnosis not present

## 2014-10-13 DIAGNOSIS — I10 Essential (primary) hypertension: Secondary | ICD-10-CM | POA: Diagnosis not present

## 2014-10-13 DIAGNOSIS — Z79899 Other long term (current) drug therapy: Secondary | ICD-10-CM | POA: Diagnosis not present

## 2014-10-13 DIAGNOSIS — Z8719 Personal history of other diseases of the digestive system: Secondary | ICD-10-CM | POA: Diagnosis not present

## 2014-10-13 DIAGNOSIS — Z8701 Personal history of pneumonia (recurrent): Secondary | ICD-10-CM | POA: Diagnosis not present

## 2014-10-13 DIAGNOSIS — N281 Cyst of kidney, acquired: Secondary | ICD-10-CM | POA: Diagnosis not present

## 2014-10-16 DIAGNOSIS — M5412 Radiculopathy, cervical region: Secondary | ICD-10-CM | POA: Diagnosis not present

## 2014-10-16 DIAGNOSIS — M9903 Segmental and somatic dysfunction of lumbar region: Secondary | ICD-10-CM | POA: Diagnosis not present

## 2014-10-16 DIAGNOSIS — M5136 Other intervertebral disc degeneration, lumbar region: Secondary | ICD-10-CM | POA: Diagnosis not present

## 2014-10-16 DIAGNOSIS — M9901 Segmental and somatic dysfunction of cervical region: Secondary | ICD-10-CM | POA: Diagnosis not present

## 2014-10-17 DIAGNOSIS — D509 Iron deficiency anemia, unspecified: Secondary | ICD-10-CM | POA: Diagnosis not present

## 2014-10-17 DIAGNOSIS — D693 Immune thrombocytopenic purpura: Secondary | ICD-10-CM | POA: Diagnosis not present

## 2014-10-17 DIAGNOSIS — C9210 Chronic myeloid leukemia, BCR/ABL-positive, not having achieved remission: Secondary | ICD-10-CM | POA: Diagnosis not present

## 2014-10-17 LAB — CBC CANCER CENTER
Basophil: 1 %
EOS PCT: 2 %
HCT: 30.6 % — ABNORMAL LOW (ref 35.0–47.0)
HGB: 9.3 g/dL — ABNORMAL LOW (ref 12.0–16.0)
LYMPHS PCT: 13 %
MCH: 24.2 pg — ABNORMAL LOW (ref 26.0–34.0)
MCHC: 30.5 g/dL — ABNORMAL LOW (ref 32.0–36.0)
MCV: 80 fL (ref 80–100)
Monocytes: 18 %
PLATELETS: 107 x10 3/mm — AB (ref 150–440)
RBC: 3.85 10*6/uL (ref 3.80–5.20)
RDW: 19.6 % — ABNORMAL HIGH (ref 11.5–14.5)
SEGMENTED NEUTROPHILS: 66 %
WBC: 14.7 x10 3/mm — ABNORMAL HIGH (ref 3.6–11.0)

## 2014-10-17 LAB — CREATININE, SERUM
CREATININE: 1.63 mg/dL — AB (ref 0.60–1.30)
EGFR (African American): 40 — ABNORMAL LOW
EGFR (Non-African Amer.): 33 — ABNORMAL LOW

## 2014-11-13 ENCOUNTER — Ambulatory Visit: Payer: Self-pay | Admitting: Internal Medicine

## 2014-11-13 DIAGNOSIS — D693 Immune thrombocytopenic purpura: Secondary | ICD-10-CM | POA: Diagnosis not present

## 2014-11-13 DIAGNOSIS — Z8701 Personal history of pneumonia (recurrent): Secondary | ICD-10-CM | POA: Diagnosis not present

## 2014-11-13 DIAGNOSIS — Z8719 Personal history of other diseases of the digestive system: Secondary | ICD-10-CM | POA: Diagnosis not present

## 2014-11-13 DIAGNOSIS — C9210 Chronic myeloid leukemia, BCR/ABL-positive, not having achieved remission: Secondary | ICD-10-CM | POA: Diagnosis not present

## 2014-11-13 DIAGNOSIS — N281 Cyst of kidney, acquired: Secondary | ICD-10-CM | POA: Diagnosis not present

## 2014-11-13 DIAGNOSIS — D696 Thrombocytopenia, unspecified: Secondary | ICD-10-CM | POA: Diagnosis not present

## 2014-11-13 DIAGNOSIS — D72821 Monocytosis (symptomatic): Secondary | ICD-10-CM | POA: Diagnosis not present

## 2014-11-13 DIAGNOSIS — N19 Unspecified kidney failure: Secondary | ICD-10-CM | POA: Diagnosis not present

## 2014-11-13 DIAGNOSIS — D509 Iron deficiency anemia, unspecified: Secondary | ICD-10-CM | POA: Diagnosis not present

## 2014-11-13 DIAGNOSIS — E780 Pure hypercholesterolemia: Secondary | ICD-10-CM | POA: Diagnosis not present

## 2014-11-13 DIAGNOSIS — R7989 Other specified abnormal findings of blood chemistry: Secondary | ICD-10-CM | POA: Diagnosis not present

## 2014-11-13 DIAGNOSIS — R634 Abnormal weight loss: Secondary | ICD-10-CM | POA: Diagnosis not present

## 2014-11-13 DIAGNOSIS — Z79899 Other long term (current) drug therapy: Secondary | ICD-10-CM | POA: Diagnosis not present

## 2014-11-13 DIAGNOSIS — I10 Essential (primary) hypertension: Secondary | ICD-10-CM | POA: Diagnosis not present

## 2014-11-13 DIAGNOSIS — Z86010 Personal history of colonic polyps: Secondary | ICD-10-CM | POA: Diagnosis not present

## 2014-11-21 DIAGNOSIS — D693 Immune thrombocytopenic purpura: Secondary | ICD-10-CM | POA: Diagnosis not present

## 2014-11-21 DIAGNOSIS — C9210 Chronic myeloid leukemia, BCR/ABL-positive, not having achieved remission: Secondary | ICD-10-CM | POA: Diagnosis not present

## 2014-11-21 DIAGNOSIS — D509 Iron deficiency anemia, unspecified: Secondary | ICD-10-CM | POA: Diagnosis not present

## 2014-11-21 DIAGNOSIS — N189 Chronic kidney disease, unspecified: Secondary | ICD-10-CM | POA: Diagnosis not present

## 2014-11-21 DIAGNOSIS — D696 Thrombocytopenia, unspecified: Secondary | ICD-10-CM | POA: Diagnosis not present

## 2014-12-05 DIAGNOSIS — C9210 Chronic myeloid leukemia, BCR/ABL-positive, not having achieved remission: Secondary | ICD-10-CM | POA: Diagnosis not present

## 2014-12-05 DIAGNOSIS — D693 Immune thrombocytopenic purpura: Secondary | ICD-10-CM | POA: Diagnosis not present

## 2014-12-05 DIAGNOSIS — D509 Iron deficiency anemia, unspecified: Secondary | ICD-10-CM | POA: Diagnosis not present

## 2014-12-05 DIAGNOSIS — D696 Thrombocytopenia, unspecified: Secondary | ICD-10-CM | POA: Diagnosis not present

## 2014-12-09 ENCOUNTER — Other Ambulatory Visit (HOSPITAL_COMMUNITY): Payer: Self-pay | Admitting: Internal Medicine

## 2014-12-12 ENCOUNTER — Ambulatory Visit
Admit: 2014-12-12 | Disposition: A | Discharge status: Home / Self Care | Payer: Self-pay | Attending: Internal Medicine | Admitting: Internal Medicine

## 2014-12-12 DIAGNOSIS — D696 Thrombocytopenia, unspecified: Secondary | ICD-10-CM | POA: Diagnosis not present

## 2014-12-12 DIAGNOSIS — R7989 Other specified abnormal findings of blood chemistry: Secondary | ICD-10-CM | POA: Diagnosis not present

## 2014-12-12 DIAGNOSIS — N19 Unspecified kidney failure: Secondary | ICD-10-CM | POA: Diagnosis not present

## 2014-12-12 DIAGNOSIS — D693 Immune thrombocytopenic purpura: Secondary | ICD-10-CM | POA: Diagnosis not present

## 2014-12-12 DIAGNOSIS — E780 Pure hypercholesterolemia: Secondary | ICD-10-CM | POA: Diagnosis not present

## 2014-12-12 DIAGNOSIS — N281 Cyst of kidney, acquired: Secondary | ICD-10-CM | POA: Diagnosis not present

## 2014-12-12 DIAGNOSIS — R634 Abnormal weight loss: Secondary | ICD-10-CM | POA: Diagnosis not present

## 2014-12-12 DIAGNOSIS — C9210 Chronic myeloid leukemia, BCR/ABL-positive, not having achieved remission: Secondary | ICD-10-CM | POA: Diagnosis not present

## 2014-12-12 DIAGNOSIS — Z79899 Other long term (current) drug therapy: Secondary | ICD-10-CM | POA: Diagnosis not present

## 2014-12-12 DIAGNOSIS — Z86010 Personal history of colonic polyps: Secondary | ICD-10-CM | POA: Diagnosis not present

## 2014-12-12 DIAGNOSIS — Z8719 Personal history of other diseases of the digestive system: Secondary | ICD-10-CM | POA: Diagnosis not present

## 2014-12-12 DIAGNOSIS — D72821 Monocytosis (symptomatic): Secondary | ICD-10-CM | POA: Diagnosis not present

## 2014-12-12 DIAGNOSIS — D509 Iron deficiency anemia, unspecified: Secondary | ICD-10-CM | POA: Diagnosis not present

## 2014-12-12 DIAGNOSIS — I10 Essential (primary) hypertension: Secondary | ICD-10-CM | POA: Diagnosis not present

## 2014-12-12 DIAGNOSIS — Z8701 Personal history of pneumonia (recurrent): Secondary | ICD-10-CM | POA: Diagnosis not present

## 2014-12-19 DIAGNOSIS — D509 Iron deficiency anemia, unspecified: Secondary | ICD-10-CM | POA: Diagnosis not present

## 2014-12-19 DIAGNOSIS — C9210 Chronic myeloid leukemia, BCR/ABL-positive, not having achieved remission: Secondary | ICD-10-CM | POA: Diagnosis not present

## 2014-12-26 DIAGNOSIS — C9210 Chronic myeloid leukemia, BCR/ABL-positive, not having achieved remission: Secondary | ICD-10-CM | POA: Diagnosis not present

## 2014-12-26 DIAGNOSIS — D509 Iron deficiency anemia, unspecified: Secondary | ICD-10-CM | POA: Diagnosis not present

## 2015-01-02 DIAGNOSIS — D509 Iron deficiency anemia, unspecified: Secondary | ICD-10-CM | POA: Diagnosis not present

## 2015-01-02 DIAGNOSIS — C9210 Chronic myeloid leukemia, BCR/ABL-positive, not having achieved remission: Secondary | ICD-10-CM | POA: Diagnosis not present

## 2015-01-09 LAB — CBC CANCER CENTER
BASOS ABS: 0.2 x10 3/mm — AB (ref 0.0–0.1)
Basophil %: 1.2 %
EOS ABS: 0.3 x10 3/mm (ref 0.0–0.7)
Eosinophil %: 1.7 %
HCT: 29.1 % — AB (ref 35.0–47.0)
HGB: 9 g/dL — AB (ref 12.0–16.0)
LYMPHS ABS: 1 x10 3/mm (ref 1.0–3.6)
Lymphocyte %: 5.8 %
MCH: 24.4 pg — ABNORMAL LOW (ref 26.0–34.0)
MCHC: 31 g/dL — ABNORMAL LOW (ref 32.0–36.0)
MCV: 79 fL — ABNORMAL LOW (ref 80–100)
MONO ABS: 2.1 x10 3/mm — AB (ref 0.2–0.9)
Monocyte %: 12.6 %
Neutrophil #: 13.1 x10 3/mm — ABNORMAL HIGH (ref 1.4–6.5)
Neutrophil %: 78.7 %
PLATELETS: 160 x10 3/mm (ref 150–440)
RBC: 3.69 10*6/uL — ABNORMAL LOW (ref 3.80–5.20)
RDW: 22.7 % — AB (ref 11.5–14.5)
WBC: 16.6 x10 3/mm — AB (ref 3.6–11.0)

## 2015-01-09 LAB — IRON AND TIBC
IRON BIND. CAP.(TOTAL): 152 — AB (ref 250–450)
IRON: 20 ug/dL — AB
Iron Saturation: 13.1
Unbound Iron-Bind.Cap.: 132.2

## 2015-01-09 LAB — FERRITIN: FERRITIN (ARMC): 486 ng/mL — AB

## 2015-01-10 DIAGNOSIS — N183 Chronic kidney disease, stage 3 (moderate): Secondary | ICD-10-CM | POA: Diagnosis not present

## 2015-01-10 DIAGNOSIS — I129 Hypertensive chronic kidney disease with stage 1 through stage 4 chronic kidney disease, or unspecified chronic kidney disease: Secondary | ICD-10-CM | POA: Diagnosis not present

## 2015-01-10 DIAGNOSIS — Z5181 Encounter for therapeutic drug level monitoring: Secondary | ICD-10-CM | POA: Diagnosis not present

## 2015-01-10 DIAGNOSIS — Z5189 Encounter for other specified aftercare: Secondary | ICD-10-CM | POA: Diagnosis not present

## 2015-01-10 DIAGNOSIS — Z79899 Other long term (current) drug therapy: Secondary | ICD-10-CM | POA: Diagnosis not present

## 2015-01-12 ENCOUNTER — Ambulatory Visit
Admit: 2015-01-12 | Disposition: A | Discharge status: Home / Self Care | Payer: Self-pay | Attending: Internal Medicine | Admitting: Internal Medicine

## 2015-01-12 DIAGNOSIS — N281 Cyst of kidney, acquired: Secondary | ICD-10-CM | POA: Diagnosis not present

## 2015-01-12 DIAGNOSIS — D509 Iron deficiency anemia, unspecified: Secondary | ICD-10-CM | POA: Diagnosis not present

## 2015-01-12 DIAGNOSIS — Z8719 Personal history of other diseases of the digestive system: Secondary | ICD-10-CM | POA: Diagnosis not present

## 2015-01-12 DIAGNOSIS — D72821 Monocytosis (symptomatic): Secondary | ICD-10-CM | POA: Diagnosis not present

## 2015-01-12 DIAGNOSIS — D693 Immune thrombocytopenic purpura: Secondary | ICD-10-CM | POA: Diagnosis not present

## 2015-01-12 DIAGNOSIS — E780 Pure hypercholesterolemia: Secondary | ICD-10-CM | POA: Diagnosis not present

## 2015-01-12 DIAGNOSIS — Z86010 Personal history of colonic polyps: Secondary | ICD-10-CM | POA: Diagnosis not present

## 2015-01-12 DIAGNOSIS — N19 Unspecified kidney failure: Secondary | ICD-10-CM | POA: Diagnosis not present

## 2015-01-12 DIAGNOSIS — D696 Thrombocytopenia, unspecified: Secondary | ICD-10-CM | POA: Diagnosis not present

## 2015-01-12 DIAGNOSIS — I10 Essential (primary) hypertension: Secondary | ICD-10-CM | POA: Diagnosis not present

## 2015-01-12 DIAGNOSIS — Z79899 Other long term (current) drug therapy: Secondary | ICD-10-CM | POA: Diagnosis not present

## 2015-01-12 DIAGNOSIS — Z8701 Personal history of pneumonia (recurrent): Secondary | ICD-10-CM | POA: Diagnosis not present

## 2015-01-12 DIAGNOSIS — R7989 Other specified abnormal findings of blood chemistry: Secondary | ICD-10-CM | POA: Diagnosis not present

## 2015-01-12 DIAGNOSIS — C9210 Chronic myeloid leukemia, BCR/ABL-positive, not having achieved remission: Secondary | ICD-10-CM | POA: Diagnosis not present

## 2015-01-12 DIAGNOSIS — R634 Abnormal weight loss: Secondary | ICD-10-CM | POA: Diagnosis not present

## 2015-01-16 LAB — CANCER CENTER HEMOGLOBIN: HGB: 8.8 g/dL — AB (ref 12.0–16.0)

## 2015-01-23 LAB — CANCER CENTER HEMOGLOBIN: HGB: 9 g/dL — ABNORMAL LOW (ref 12.0–16.0)

## 2015-01-23 LAB — IRON AND TIBC
IRON SATURATION: 12.7
IRON: 25 ug/dL — AB
Iron Bind.Cap.(Total): 197 — ABNORMAL LOW (ref 250–450)
Unbound Iron-Bind.Cap.: 171.8

## 2015-01-26 LAB — CREATININE, SERUM: Creatine, Serum: 1.69

## 2015-02-06 DIAGNOSIS — Z79899 Other long term (current) drug therapy: Secondary | ICD-10-CM | POA: Diagnosis not present

## 2015-02-06 DIAGNOSIS — D693 Immune thrombocytopenic purpura: Secondary | ICD-10-CM | POA: Diagnosis not present

## 2015-02-06 DIAGNOSIS — D509 Iron deficiency anemia, unspecified: Secondary | ICD-10-CM | POA: Diagnosis not present

## 2015-02-06 DIAGNOSIS — C9210 Chronic myeloid leukemia, BCR/ABL-positive, not having achieved remission: Secondary | ICD-10-CM | POA: Diagnosis not present

## 2015-02-06 LAB — OCCULT BLOOD X 1 CARD TO LAB, STOOL
OCCULT BLOOD, FECES: NEGATIVE
Occult Blood, Feces: NEGATIVE
Occult Blood, Feces: NEGATIVE

## 2015-02-06 LAB — CANCER CENTER HEMOGLOBIN: HGB: 8.6 g/dL — ABNORMAL LOW (ref 12.0–16.0)

## 2015-02-08 ENCOUNTER — Other Ambulatory Visit: Payer: Self-pay | Admitting: *Deleted

## 2015-02-08 DIAGNOSIS — D509 Iron deficiency anemia, unspecified: Secondary | ICD-10-CM

## 2015-02-13 ENCOUNTER — Inpatient Hospital Stay: Payer: Medicare Other | Attending: Internal Medicine

## 2015-02-13 ENCOUNTER — Ambulatory Visit: Payer: Medicare Other

## 2015-02-13 DIAGNOSIS — Z79899 Other long term (current) drug therapy: Secondary | ICD-10-CM | POA: Insufficient documentation

## 2015-02-13 DIAGNOSIS — D693 Immune thrombocytopenic purpura: Secondary | ICD-10-CM | POA: Insufficient documentation

## 2015-02-13 DIAGNOSIS — D631 Anemia in chronic kidney disease: Secondary | ICD-10-CM | POA: Insufficient documentation

## 2015-02-13 DIAGNOSIS — R634 Abnormal weight loss: Secondary | ICD-10-CM | POA: Diagnosis not present

## 2015-02-13 DIAGNOSIS — N19 Unspecified kidney failure: Secondary | ICD-10-CM | POA: Diagnosis not present

## 2015-02-13 DIAGNOSIS — D509 Iron deficiency anemia, unspecified: Secondary | ICD-10-CM | POA: Insufficient documentation

## 2015-02-13 DIAGNOSIS — R7989 Other specified abnormal findings of blood chemistry: Secondary | ICD-10-CM | POA: Insufficient documentation

## 2015-02-13 DIAGNOSIS — E780 Pure hypercholesterolemia: Secondary | ICD-10-CM | POA: Insufficient documentation

## 2015-02-13 DIAGNOSIS — I129 Hypertensive chronic kidney disease with stage 1 through stage 4 chronic kidney disease, or unspecified chronic kidney disease: Secondary | ICD-10-CM | POA: Diagnosis not present

## 2015-02-13 DIAGNOSIS — C9210 Chronic myeloid leukemia, BCR/ABL-positive, not having achieved remission: Secondary | ICD-10-CM | POA: Diagnosis not present

## 2015-02-13 DIAGNOSIS — M129 Arthropathy, unspecified: Secondary | ICD-10-CM | POA: Diagnosis not present

## 2015-02-13 DIAGNOSIS — E785 Hyperlipidemia, unspecified: Secondary | ICD-10-CM | POA: Insufficient documentation

## 2015-02-13 DIAGNOSIS — R609 Edema, unspecified: Secondary | ICD-10-CM | POA: Insufficient documentation

## 2015-02-13 DIAGNOSIS — N189 Chronic kidney disease, unspecified: Secondary | ICD-10-CM | POA: Diagnosis present

## 2015-02-13 DIAGNOSIS — I10 Essential (primary) hypertension: Secondary | ICD-10-CM | POA: Diagnosis not present

## 2015-02-13 LAB — CBC
HCT: 28.5 % — ABNORMAL LOW (ref 35.0–47.0)
Hemoglobin: 8.8 g/dL — ABNORMAL LOW (ref 12.0–16.0)
MCH: 24.3 pg — ABNORMAL LOW (ref 26.0–34.0)
MCHC: 30.8 g/dL — ABNORMAL LOW (ref 32.0–36.0)
MCV: 78.9 fL — AB (ref 80.0–100.0)
Platelets: 93 10*3/uL — ABNORMAL LOW (ref 150–440)
RBC: 3.61 MIL/uL — ABNORMAL LOW (ref 3.80–5.20)
RDW: 20.8 % — AB (ref 11.5–14.5)
WBC: 13.4 10*3/uL — AB (ref 3.6–11.0)

## 2015-02-13 LAB — IRON AND TIBC
Iron: 32 ug/dL (ref 28–170)
Saturation Ratios: 18 % (ref 10.4–31.8)
TIBC: 174 ug/dL — AB (ref 250–450)
UIBC: 142 ug/dL

## 2015-02-13 LAB — FERRITIN: Ferritin: 430 ng/mL — ABNORMAL HIGH (ref 11–307)

## 2015-02-19 ENCOUNTER — Other Ambulatory Visit: Payer: Self-pay | Admitting: *Deleted

## 2015-02-19 DIAGNOSIS — D509 Iron deficiency anemia, unspecified: Secondary | ICD-10-CM

## 2015-02-20 ENCOUNTER — Encounter: Payer: Self-pay | Admitting: Internal Medicine

## 2015-02-20 ENCOUNTER — Inpatient Hospital Stay (HOSPITAL_BASED_OUTPATIENT_CLINIC_OR_DEPARTMENT_OTHER): Payer: Medicare Other | Admitting: Internal Medicine

## 2015-02-20 ENCOUNTER — Inpatient Hospital Stay: Payer: Medicare Other

## 2015-02-20 VITALS — BP 158/85 | HR 78 | Temp 97.7°F | Resp 17 | Ht 65.0 in | Wt 136.2 lb

## 2015-02-20 DIAGNOSIS — E780 Pure hypercholesterolemia: Secondary | ICD-10-CM

## 2015-02-20 DIAGNOSIS — D509 Iron deficiency anemia, unspecified: Secondary | ICD-10-CM | POA: Diagnosis not present

## 2015-02-20 DIAGNOSIS — N19 Unspecified kidney failure: Secondary | ICD-10-CM

## 2015-02-20 DIAGNOSIS — Z79899 Other long term (current) drug therapy: Secondary | ICD-10-CM | POA: Diagnosis not present

## 2015-02-20 DIAGNOSIS — N189 Chronic kidney disease, unspecified: Secondary | ICD-10-CM | POA: Diagnosis not present

## 2015-02-20 DIAGNOSIS — D693 Immune thrombocytopenic purpura: Secondary | ICD-10-CM | POA: Diagnosis not present

## 2015-02-20 DIAGNOSIS — R7989 Other specified abnormal findings of blood chemistry: Secondary | ICD-10-CM

## 2015-02-20 DIAGNOSIS — R634 Abnormal weight loss: Secondary | ICD-10-CM | POA: Diagnosis not present

## 2015-02-20 DIAGNOSIS — C9250 Acute myelomonocytic leukemia, not having achieved remission: Secondary | ICD-10-CM

## 2015-02-20 DIAGNOSIS — C9210 Chronic myeloid leukemia, BCR/ABL-positive, not having achieved remission: Secondary | ICD-10-CM | POA: Diagnosis not present

## 2015-02-20 DIAGNOSIS — I10 Essential (primary) hypertension: Secondary | ICD-10-CM

## 2015-02-20 LAB — HEMOGLOBIN: Hemoglobin: 8.7 g/dL — ABNORMAL LOW (ref 12.0–16.0)

## 2015-02-20 NOTE — Progress Notes (Signed)
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   Referred by PMD DR Bethel MD [place] [phone].  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  ,  WT LOSS  , IRON DEFICIENCY, ANEMIA,  NAUSEA, APPETITE LOSS, REFLUX,  .,,HAS BEEN TO [facility] CARDILOGY, NO CATH RECOMMENDED, .. , HAD FLU AND PNEUMONIA SHOT 07/2013, ON 07/31/13 ADMITTED [facility] WITH SYMPTOMATIC ANEMIA, SVT, CHF, TX DIURESIS AND PRBC 1 UNIT. Marland Kitchen HAD MAMMOGRAM 08/16/13   HAD EGD AND COLONOSCOPY 09/12/13.Marland KitchenPOLYPS, GASTRITIS, NO MALIGNANCY  . NEW PROBLEM PROGRESSIVE RENAL FAILURE.  RECENT PROBLEM PERSISTENT ANEMIA ATTRIBUTE TO AZOTEMIA , S/P FIRST TX WITH PROCRIT  06/06/14, FOR HGB 8.2 . NOTE CMML CONTRIBUTION TO ANEMIA. NOTE RECENT NORMAL ELECTROPHERESIS, NEGATIVE COOMBS, NO ACTIVE BLEEDING, IV IRON FERAHEME PRIOR GIVEN IN JUNE/15 , LAST PROCRIT WAS 9/4, with hgb 8.7  HELD SINCE, WITH POSSIBLE CARDIAC SYMPTOMS  HISTORY OF PRESENT ILLNESS:  HPI   Patient returns for regular followup. , .  .As prior noted, last yr had  urology evaluation for hematuria, had an IVP,  and later CT scan and then released by Dr Madelin Headings. Then prior to 04/19/13 visit, shown low iron saturation 12%, and had similar low iron studies in PMD office.   [facility] hospital for nausea and vomiting, then at [facility] for pneumonia, .THEN [facility] FOR ANEMIA, ARRYTHMIA CHF. SEE ALSO CURRENT ILLNESS , NOTE GI W/U DEC 2014. Marland Kitchen NOTE PROGRESSIVE RENAL FAILURE, RECENT, RENAL U/S  BENIGN, CYSTS NO OBSTRUCTION, SIEP UNREMARKABLE, MILD HIGH LIGHT CHAINS, NORMAL RATIO.  REFERRED TO NEPHROLOGY,  DR Tamala Julian ON 9/8 .  LOOKS LIKE VERY RESPONSIVE TO PROCRIT,  STRESS TEST [place] ON 08/01/14 DR CALLWOOD.  HAS  HAD FLU AND PNEUMONIA SHOT. .... WHEN SEEN ON 2/9 CRE HADJUMPED TO 2.74 FROM PRIOR 1.58, CBC, K, CA, URIC ACID OK. RENAL U/S NO OBSTRUCTION.. I HELD LASIX AND ULORIC.Marland Kitchen3 DAYS LATER CRE 2.3, THEN ON 2/18 CRE 1.56. ELECTROPHERESIS UNREMARKABLE ON FEB 23, HGB 8.6 . LATER BACK TO DR Iona Beard, RESTARTED ULORIC AND HALF DOSE LASIX, THEN SEEN 3/8, HGB 7.9, IRON SAT LOW, RECALLED FOR IV VENOFER DOSE 3/15. .. TODAY, for f/u..sincelastvisit, HGB FOLLOWED, IRON SAT SHOWN LOW EVEN WITH HIGH FERAHEME, VENOFERR GIVEN X 2, SOME INCREASE IN HGB, FROM 8.2 TO 9.6, WAX AND WANE,  THEN HGB BACK DOWN TO  8.6,    TODAY FOR F/U. HAS RECENT STOOLS NEG X 3. HAD VENOFER 2 WEEKS AGO. REPEAT IRON SAT WAS 18%, HIGHER THAN PRIOR AND NORMAL RANGE   MILD, SOBOE, NOT AT REST , NO DIZZINESS, CP, HEADACHE, PALPITATION.Marland Kitchen   HAS NOT NEEDED PROCRIT.  HAS WAX WANE PRETIBIAL EDEMA, NO CALF TENDERNESS OR LEG PAIN....SEE ALSO IMP AND PLAN.   PFSH:  Additional Past Medical and Surgical History Hypercholesterolemia, hypertension, in the past had hematuria evaluated with CT and IVP Dr. Madelin Headings and was released, family history is negative for breast ovarian colorectal cancer or blood disorders. no recent changes Social no alcohol no tobacco...     Marland KitchenBack in December 2008 patient had wanted daughter  [name] as healthcare power of attorney and she does want resuscitation if needed   ALLERGIES:  No Known Allergies:   HOME MEDICATIONS: Medication Instructions Last Modified Date/Time  ferrous gluconate 325 mg oral tablet 1 tab(s) orally 2 times a day 05-Apr-16 11:18  K-Tab 20 mEq oral tablet, extended release 1 tab(s) orally once a day  05-Apr-16 11:18  I-Caps Eye Vitamin and Mineral Supplement 1 cap(s) orally 2 times a day 05-Apr-16 11:18  Systane preservative-free ophthalmic solution 1 drop(s) to each affected eye 2 times a day 05-Apr-16 11:18  polyethylene glycol 3350 - oral powder for reconstitution 1  packet(s) orally once a day, As Needed - for Constipation 05-Apr-16 11:18  docusate sodium sodium 100 mg oral capsule 1 cap(s) orally 2 times a day 05-Apr-16 11:18  metoprolol 25 mg oral tablet 1 tab(s) orally 2 times a day 05-Apr-16 11:18  oxybutynin 5 mg/24 hours oral tablet, extended release 1 tab(s) orally once a day 05-Apr-16 11:18  pantoprazole 40 mg oral delayed release tablet 1 tab(s) orally once a day 05-Apr-16 11:18  multivitamin 1 tab(s) orally once a day 05-Apr-16 11:18   Additional Medications:  1. LASIX 20 PO QD AND ULORIC 40 QD UNTIL 11/21/14, THEN HELD   2. RECENTLY RESTARTED 1/2 TAB LASIX AND ULORIC DAILY   ROS:  Review of Systems    ENT:  No headache, dizziness or epistaxis. no ear or jaw discomfort RESPIRATORY:    No shortness of breath at rest. No wheezing. No hemoptysis. CARDIAC:  No palpitations.   GU:  No dysuria    NEURO:  No focal weakness.   No headache or dizziness.  GI no abdo pain or diarrhea                                                                                                                           Nursing Vital Signs and Chemo Flowsheets: *CC Vital Signs Flowsheet:   26-Apr-16 10:22  Temp Temperature 97.1  Pulse Pulse 82  Respirations Respirations 18  SBP SBP 158  DBP DBP 77  Pain Scale (0-10)  0  Pulse Oxi  99  Current Weight (kg) (kg) 62.6  Height (cm) centimeters 165.1  BSA (m2) 1.6   Physical Exam:  Physical Exam CONSTITUTIONAL: Alert and cooperative.  no acute distress. RESPIRATORY: Lungs clear.  decreased air entry No wheezing, rales or rhonchi. LYMPH NODES:  Negative in the neck, supraclavicular and submandibular  bilateral axillary palpable nodes, largest less than 1cm lymph nodes, has wax and wane, STABLE FROM 2/9 EXAM  CARDIAC: Heart regular.   GI: Abdomen nontender. No guarding, rigidity or ascites.no palpable mass, organomegaly EXTREMITIES:  2 /2PLUS PRETIBIAL EDEMA SYMMETRIC.Marland KitchenMarland KitchenPATIENT STATES GOES DOWN WITH ELEVATION   .Marland KitchenSLIGHT IMPROVED FROM PRIOR EXAM SKIN: No bruising. No rash. NEURO: Alert and cooperative. No gross focal weakness. Cranial nerves intact.   Laboratory Results: Routine Hem:  26-Apr-16 09:42   Hemoglobin (CBC)  8.6 (Result(s) reported on 06 Feb 2015 at 11:01AM.)   ASSESSMENT AND PLAN:  Impression   Myelomonocytic leukemia stable, BCR neg in  04 and 06, history of ITP prednisone responsive,   no bleeding, , previous polyclonal gammopathy,  waxing and waning lymphadenopathy, exam and nodes stable    Also , as prior noted FISH for PDGF done and neg 10/20/11.  . 1. lost  weight large amount rapid initially, , suspicious for malignancy, but stabilized, some slow continued wt loss, also factors of anemia, infection, chf,   2.  increased cre, highest at 2.24, note cre was as high as 1.84 in april , cre was 1.44 dec/13, 1.34 12/2011, 1.04 Oct 2011, with high uric acid  better on uloric  . 3 Anemia,  hgb prior baseline was 12.5, in april was down to 10, , later below 8g   4. Iron deficiency,  EGD and Colonoscopy no malignancy but gastritis  5. Recurrent thrush prior no rcent     6., HGB 10/21 AT [facility] UP TO 9.1 AFTER BLOOD, WAS SYMPTOMATIC OF CHF WITH TACHYCARDIA AT 7.6. CRE DOWN TO 1.07.  later cre back up    AS PRIOR NOTED, . HGB  SLOWLY DOWN OVER TIME.  LOOKS LIKE ANEMIA OF CHRONIC DISEASE, MYELOPROLIFERATIVE DISEASE, AND AZOTEMIA.Marland KitchenNOTE PLTS AND WBC AND LYMPH NODE EXAM STABLE. SIEP AND LIGHT CHAINS OK, MINOR ABNORMALITIES TO FOLLOW, RENAL U/S OK,........TODAY, SEE ALSO CURRENT ILLNESS. HGB HAS BEEN HOLDING, BUT TODAYS RESULT PENDING.., NO SIGNS OR SYMPTOMS BLEEDING, NOTE GI W/U 09/2013, NOTE FERRITIN OVER 400 BUT IRON SATS LOW   IN SPITE OF PO IRON, THEN S/P REPEATED DOSES VENOFER, INITIALLY SOME IMPROVEMENT IN HGB AND IRON SAT   Plan   HOLD PROCRIT, F/U IN 2 WEEKS,  CHECK TODAYS HGB LATER IF OK WITH CARDIOLOGY,  AND IF BP LESS THAN 161 SYSTOLIC, WOULD GIVE PROCRIT PRN FOR SYMPTOMATIC ANEMIA, AFTER IRON  CORRECTED. ., I WILL DEFER MANAGEMENT OF LASIX, RENAL FAILURE, HYPERURICEMIA, TO MEDICINE, AND FOLLOW UP RE CMML AND ANEMIA, AND PRN PROCRIT, IN THE FUTURE. FOR NOW, IV IRON, VENOFER 200MG  PRN FOR LOW IRON SAT, NONE TODAY   Physicians To Recieve Fax: Earlene Plater, MD - [phone].  Electronic Signatures: Dallas Schimke (MD)  (Signed 26-Apr-16 14:50)  Authored: FOLLOW UP, DIAGNOSIS, HISTORY OF PRESENT ILLNESS, PFSH, ALLERGIES, OUTPATIENT MEDS, MEDICATIONS Additional Comments, ROS, NURSING NOTES, PE, LABORATORY RESULTS, ASSESSMENT AND PLAN, Fax to Physician   Last Updated: 26-Apr-16 14:50 by Dallas Schimke (MD)

## 2015-03-06 ENCOUNTER — Inpatient Hospital Stay: Payer: Medicare Other

## 2015-03-06 ENCOUNTER — Encounter: Payer: Self-pay | Admitting: Internal Medicine

## 2015-03-06 ENCOUNTER — Inpatient Hospital Stay (HOSPITAL_BASED_OUTPATIENT_CLINIC_OR_DEPARTMENT_OTHER): Payer: Medicare Other | Admitting: Internal Medicine

## 2015-03-06 VITALS — BP 143/99 | HR 88 | Temp 98.5°F | Ht 65.0 in | Wt 138.4 lb

## 2015-03-06 DIAGNOSIS — D509 Iron deficiency anemia, unspecified: Secondary | ICD-10-CM | POA: Diagnosis not present

## 2015-03-06 DIAGNOSIS — C9210 Chronic myeloid leukemia, BCR/ABL-positive, not having achieved remission: Secondary | ICD-10-CM

## 2015-03-06 DIAGNOSIS — R609 Edema, unspecified: Secondary | ICD-10-CM

## 2015-03-06 DIAGNOSIS — N189 Chronic kidney disease, unspecified: Secondary | ICD-10-CM | POA: Diagnosis not present

## 2015-03-06 DIAGNOSIS — E780 Pure hypercholesterolemia: Secondary | ICD-10-CM

## 2015-03-06 DIAGNOSIS — R7989 Other specified abnormal findings of blood chemistry: Secondary | ICD-10-CM | POA: Diagnosis not present

## 2015-03-06 DIAGNOSIS — R634 Abnormal weight loss: Secondary | ICD-10-CM

## 2015-03-06 DIAGNOSIS — I10 Essential (primary) hypertension: Secondary | ICD-10-CM

## 2015-03-06 DIAGNOSIS — Z79899 Other long term (current) drug therapy: Secondary | ICD-10-CM | POA: Diagnosis not present

## 2015-03-06 DIAGNOSIS — C9250 Acute myelomonocytic leukemia, not having achieved remission: Secondary | ICD-10-CM

## 2015-03-06 DIAGNOSIS — D693 Immune thrombocytopenic purpura: Secondary | ICD-10-CM

## 2015-03-06 DIAGNOSIS — N19 Unspecified kidney failure: Secondary | ICD-10-CM

## 2015-03-06 DIAGNOSIS — E785 Hyperlipidemia, unspecified: Secondary | ICD-10-CM

## 2015-03-06 DIAGNOSIS — M129 Arthropathy, unspecified: Secondary | ICD-10-CM

## 2015-03-06 LAB — CBC WITH DIFFERENTIAL/PLATELET
Basophils Absolute: 0.1 10*3/uL (ref 0–0.1)
Basophils Relative: 1 %
EOS PCT: 1 %
Eosinophils Absolute: 0.1 10*3/uL (ref 0–0.7)
HCT: 29.5 % — ABNORMAL LOW (ref 35.0–47.0)
Hemoglobin: 9.2 g/dL — ABNORMAL LOW (ref 12.0–16.0)
Lymphocytes Relative: 12 %
Lymphs Abs: 1.5 10*3/uL (ref 1.0–3.6)
MCH: 24.6 pg — ABNORMAL LOW (ref 26.0–34.0)
MCHC: 31.1 g/dL — ABNORMAL LOW (ref 32.0–36.0)
MCV: 78.9 fL — ABNORMAL LOW (ref 80.0–100.0)
MONO ABS: 2.9 10*3/uL — AB (ref 0.2–0.9)
Monocytes Relative: 23 %
Neutro Abs: 8.2 10*3/uL — ABNORMAL HIGH (ref 1.4–6.5)
Neutrophils Relative %: 63 %
Platelets: 99 10*3/uL — ABNORMAL LOW (ref 150–440)
RBC: 3.73 MIL/uL — ABNORMAL LOW (ref 3.80–5.20)
RDW: 20.5 % — ABNORMAL HIGH (ref 11.5–14.5)
WBC: 12.8 10*3/uL — ABNORMAL HIGH (ref 3.6–11.0)

## 2015-03-06 MED ORDER — EPOETIN ALFA 40000 UNIT/ML IJ SOLN
40000.0000 [IU] | Freq: Once | INTRAMUSCULAR | Status: AC
  Administered 2015-03-06: 40000 [IU] via SUBCUTANEOUS
  Filled 2015-03-06: qty 1

## 2015-03-06 NOTE — Progress Notes (Signed)
[place]  [phone]  Fax:(336) [place]: 1941-04-22  MR#: [id number]  CVE#:[id number]  Patient Care Team: Leotis Shames, MD as PCP - General (Family Medicine)  CHIEF COMPLAINT:  Chief Complaint  Patient presents with  . Follow-up     Chronic myelomonocytic leukemia and ITP    INTERVAL HISTORY:  Patient is here for continued follow up regarding anemia related to renal failure, also with chronic IDA, also diagnosed with CMML that per Dr. Marylene Land last note is stable.  She denies any fever, chills, nausea, vomiting, diarrhea, cough, shortness of breath, or chest pain. Reports some fatigue but no more than is her usual. She has some lower extremity edema bilaterally that is also no worse. Continues with chronic knee pain related to chronic arthritis. She otherwise has no complaints and reports feeling well.   REVIEW OF SYSTEMS:   Review of Systems  Constitutional: Positive for malaise/fatigue.  Cardiovascular: Positive for leg swelling.  Musculoskeletal: Positive for joint pain.  All other systems reviewed and are negative.   As per HPI. Otherwise, a complete review of systems is negatve.  ONCOLOGY HISTORY:  No history exists.    PAST MEDICAL HISTORY: Past Medical History  Diagnosis Date  . Hypertension   . Hyperlipidemia     PAST SURGICAL HISTORY: No past surgical history on file.  FAMILY HISTORY No family history on file.  GYNECOLOGIC HISTORY:  No LMP recorded. Patient is postmenopausal.     ADVANCED DIRECTIVES:    HEALTH MAINTENANCE: History  Substance Use Topics  . Smoking status: Never Smoker   . Smokeless tobacco: Not on file  . Alcohol Use: No     Colonoscopy:  PAP:  Bone density:  Lipid panel:  No Known Allergies  Current Outpatient Prescriptions  Medication Sig Dispense Refill  . docusate sodium 100 MG CAPS Take 100 mg by mouth 2 (two) times daily. 60 capsule 0  . febuxostat (ULORIC)  40 MG tablet Take 80 mg by mouth daily.    . feeding supplement (RESOURCE BREEZE) LIQD Take 1 Container by mouth 3 (three) times daily between meals. 90 Container 0  . ferrous gluconate (FERGON) 324 MG tablet Take 324 mg by mouth 2 (two) times daily with a meal.    . fluconazole (DIFLUCAN) 200 MG tablet Take 1 tablet (200 mg total) by mouth daily. 14 tablet 0  . fluticasone (FLONASE) 50 MCG/ACT nasal spray Place into the nose.    . furosemide (LASIX) 20 MG tablet Take 20 mg by mouth daily.  11  . levofloxacin (LEVAQUIN) 500 MG tablet Take 1 tablet (500 mg total) by mouth daily. 5 tablet 0  . metoprolol tartrate (LOPRESSOR) 25 MG tablet Take 25 mg by mouth 2 (two) times daily.    . Multiple Vitamin (MULTIVITAMIN) tablet Take 1 tablet by mouth daily.    . Multiple Vitamins-Minerals (EYE VITAMINS & MINERALS) TABS Take 1 capsule by mouth 2 (two) times daily.    Marland Kitchen nystatin (MYCOSTATIN) 100000 UNIT/ML suspension Take 5 mLs (500,000 Units total) by mouth 4 (four) times daily. 120 mL 0  . oxybutynin (DITROPAN-XL) 5 MG 24 hr tablet Take 5 mg by mouth at bedtime.    . pantoprazole (PROTONIX) 40 MG tablet Take 40 mg by mouth daily before breakfast.    . Polyethyl Glycol-Propyl Glycol 0.4-0.3 % SOLN Apply 1 drop to eye daily.    . polyethylene glycol (MIRALAX / GLYCOLAX) packet Take 17 g by  mouth daily as needed for moderate constipation.    . potassium chloride SA (K-DUR,KLOR-CON) 20 MEQ tablet Take 20 mEq by mouth daily.     Current Facility-Administered Medications  Medication Dose Route Frequency Provider Last Rate Last Dose  . epoetin alfa (EPOGEN,PROCRIT) injection 40,000 Units  40,000 Units Subcutaneous Once Evlyn Kanner, NP        OBJECTIVE: BP 143/99 mmHg  Pulse 88  Temp(Src) 98.5 F (36.9 C) (Tympanic)  Ht 5\' 5"  (1.651 m)  Wt 138 lb 7.2 oz (62.8 kg)  BMI 23.04 kg/m2   Body mass index is 23.04 kg/(m^2).    ECOG FS:2 - Symptomatic, <50% confined to bed  General: Well-developed,  well-nourished, no acute distress. Lungs: Clear to auscultation bilaterally. Heart: Regular rate and rhythm. No rubs, murmurs, or gallops. Abdomen: Soft, nontender, nondistended. No organomegaly noted, normoactive bowel sounds. Musculoskeletal: Bilateral lower extremity edema 1+ with chronic knee pain. Neuro: Alert, answering all questions appropriately. Cranial nerves grossly intact. Skin: No rashes or petechiae noted. Psych: Normal affect.   LAB RESULTS:     Component Value Date/Time   NA 140 03/14/2014 1150   NA 133* 05/14/2013 0355   K 4.1 04/11/2014 1037   K 3.6 05/14/2013 0355   CL 104 03/14/2014 1150   CL 103 05/14/2013 0355   CO2 28 03/14/2014 1150   CO2 21 05/14/2013 0355   GLUCOSE 86 03/14/2014 1150   GLUCOSE 78 05/14/2013 0355   BUN 35* 03/14/2014 1150   BUN 8 05/14/2013 0355   CREATININE 1.63* 10/17/2014 1056   CREATININE 0.99 05/14/2013 0355   CALCIUM 8.8 03/14/2014 1150   CALCIUM 8.5 05/14/2013 0355   PROT 9.1* 06/28/2013 1628   PROT 6.3 05/14/2013 0355   ALBUMIN 2.6* 06/28/2013 1628   ALBUMIN 1.8* 05/14/2013 0355   AST 16 06/28/2013 1628   AST 9 05/14/2013 0355   ALT 12 06/28/2013 1628   ALT <5 05/14/2013 0355   ALKPHOS 46 09/27/2013 0929   ALKPHOS 36* 05/14/2013 0355   BILITOT 0.3 05/14/2013 0355   GFRNONAA 31* 06/20/2014 1356   GFRNONAA 56* 05/14/2013 0355   GFRAA 36* 06/20/2014 1356   GFRAA 65* 05/14/2013 0355    No results found for: SPEP, UPEP  Lab Results  Component Value Date   WBC 12.8* 03/06/2015   NEUTROABS 8.2* 03/06/2015   HGB 9.2* 03/06/2015   HCT 29.5* 03/06/2015   MCV 78.9* 03/06/2015   PLT 99* 03/06/2015      Chemistry      Component Value Date/Time   NA 140 03/14/2014 1150   NA 133* 05/14/2013 0355   K 4.1 04/11/2014 1037   K 3.6 05/14/2013 0355   CL 104 03/14/2014 1150   CL 103 05/14/2013 0355   CO2 28 03/14/2014 1150   CO2 21 05/14/2013 0355   BUN 35* 03/14/2014 1150   BUN 8 05/14/2013 0355   CREATININE 1.63*  10/17/2014 1056   CREATININE 0.99 05/14/2013 0355      Component Value Date/Time   CALCIUM 8.8 03/14/2014 1150   CALCIUM 8.5 05/14/2013 0355   ALKPHOS 46 09/27/2013 0929   ALKPHOS 36* 05/14/2013 0355   AST 16 06/28/2013 1628   AST 9 05/14/2013 0355   ALT 12 06/28/2013 1628   ALT <5 05/14/2013 0355   BILITOT 0.3 05/14/2013 0355       No results found for: LABCA2  No components found for: [id number]  No results for input(s): INR in the last 168 hours.  Component Value Date/Time   COLORURINE YELLOW 05/10/2013 1506   APPEARANCEUR CLOUDY* 05/10/2013 1506   LABSPEC 1.022 05/10/2013 1506   PHURINE 5.5 05/10/2013 1506   GLUCOSEU NEGATIVE 05/10/2013 1506   HGBUR NEGATIVE 05/10/2013 1506   BILIRUBINUR SMALL* 05/10/2013 1506   KETONESUR 15* 05/10/2013 1506   PROTEINUR 100* 05/10/2013 1506   UROBILINOGEN 1.0 05/10/2013 1506   NITRITE NEGATIVE 05/10/2013 1506   LEUKOCYTESUR NEGATIVE 05/10/2013 1506    STUDIES: No results found.  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 CMML.  PLAN:   1. Anemia. Hgb 9.2 today. Received Venofer approximately 4 weeks ago. No iron serums are pending. Per last note from Dr. Inez Pilgrim, ok to proceed with Procrit if SBP is less than 160. Will proceed with Procrit 40,000 units today.  Will recheck lab data for patient in 2 weeks with serum iron and ibc as well as ferritin. She may require further iron supplementation.  2. CMML. Per Dr. Marylene Land most recent note patients CMML is stable.   Dr. Grayland Ormond was available for consultation and review of plan of care for this patient.  Patient expressed understanding and was in agreement with this plan. She also understands that She can call clinic at any time with any questions, concerns, or complaints.    Evlyn Kanner, NP   03/06/2015 12:12 PM

## 2015-03-20 ENCOUNTER — Inpatient Hospital Stay (HOSPITAL_BASED_OUTPATIENT_CLINIC_OR_DEPARTMENT_OTHER): Payer: Medicare Other | Admitting: Family Medicine

## 2015-03-20 ENCOUNTER — Inpatient Hospital Stay: Payer: Medicare Other | Attending: Internal Medicine

## 2015-03-20 ENCOUNTER — Other Ambulatory Visit: Payer: Self-pay | Admitting: Family Medicine

## 2015-03-20 ENCOUNTER — Inpatient Hospital Stay: Payer: Medicare Other

## 2015-03-20 ENCOUNTER — Encounter: Payer: Self-pay | Admitting: Internal Medicine

## 2015-03-20 VITALS — BP 179/88 | HR 62 | Temp 97.5°F | Resp 16 | Wt 132.0 lb

## 2015-03-20 DIAGNOSIS — R531 Weakness: Secondary | ICD-10-CM | POA: Insufficient documentation

## 2015-03-20 DIAGNOSIS — E785 Hyperlipidemia, unspecified: Secondary | ICD-10-CM

## 2015-03-20 DIAGNOSIS — J309 Allergic rhinitis, unspecified: Secondary | ICD-10-CM | POA: Insufficient documentation

## 2015-03-20 DIAGNOSIS — I10 Essential (primary) hypertension: Secondary | ICD-10-CM | POA: Insufficient documentation

## 2015-03-20 DIAGNOSIS — M109 Gout, unspecified: Secondary | ICD-10-CM | POA: Insufficient documentation

## 2015-03-20 DIAGNOSIS — D509 Iron deficiency anemia, unspecified: Secondary | ICD-10-CM

## 2015-03-20 DIAGNOSIS — Z79899 Other long term (current) drug therapy: Secondary | ICD-10-CM

## 2015-03-20 DIAGNOSIS — R32 Unspecified urinary incontinence: Secondary | ICD-10-CM | POA: Insufficient documentation

## 2015-03-20 LAB — CBC WITH DIFFERENTIAL/PLATELET
Basophils Absolute: 0.1 10*3/uL (ref 0–0.1)
Basophils Relative: 1 %
EOS PCT: 4 %
Eosinophils Absolute: 0.5 10*3/uL (ref 0–0.7)
HCT: 31.1 % — ABNORMAL LOW (ref 35.0–47.0)
Hemoglobin: 9.4 g/dL — ABNORMAL LOW (ref 12.0–16.0)
LYMPHS PCT: 11 %
Lymphs Abs: 1.4 10*3/uL (ref 1.0–3.6)
MCH: 24.2 pg — ABNORMAL LOW (ref 26.0–34.0)
MCHC: 30.2 g/dL — AB (ref 32.0–36.0)
MCV: 80 fL (ref 80.0–100.0)
MONOS PCT: 19 %
Monocytes Absolute: 2.4 10*3/uL — ABNORMAL HIGH (ref 0.2–0.9)
NEUTROS PCT: 65 %
Neutro Abs: 8.2 10*3/uL — ABNORMAL HIGH (ref 1.4–6.5)
Platelets: 86 10*3/uL — ABNORMAL LOW (ref 150–440)
RBC: 3.89 MIL/uL (ref 3.80–5.20)
RDW: 19.8 % — ABNORMAL HIGH (ref 11.5–14.5)
WBC: 12.6 10*3/uL — ABNORMAL HIGH (ref 3.6–11.0)

## 2015-03-20 LAB — BASIC METABOLIC PANEL
Anion gap: 7 (ref 5–15)
BUN: 34 mg/dL — ABNORMAL HIGH (ref 6–20)
CALCIUM: 8.8 mg/dL — AB (ref 8.9–10.3)
CHLORIDE: 105 mmol/L (ref 101–111)
CO2: 25 mmol/L (ref 22–32)
Creatinine, Ser: 1.52 mg/dL — ABNORMAL HIGH (ref 0.44–1.00)
GFR calc non Af Amer: 33 mL/min — ABNORMAL LOW (ref >60)
GFR, EST AFRICAN AMERICAN: 38 mL/min — AB (ref >60)
Glucose, Bld: 111 mg/dL — ABNORMAL HIGH (ref 65–99)
POTASSIUM: 3.9 mmol/L (ref 3.5–5.1)
Sodium: 137 mmol/L (ref 135–145)

## 2015-03-20 LAB — FERRITIN: Ferritin: 393 ng/mL — ABNORMAL HIGH (ref 11–307)

## 2015-03-20 LAB — IRON AND TIBC
Iron: 26 ug/dL — ABNORMAL LOW (ref 28–170)
Saturation Ratios: 18 % (ref 10.4–31.8)
TIBC: 144 ug/dL — ABNORMAL LOW (ref 250–450)
UIBC: 118 ug/dL

## 2015-03-20 NOTE — Progress Notes (Signed)
[name]  [phone]  Fax:(336) [place] DOB: Oct 01, 1941  MR#: [id number]  NTI#:[id number]  Patient Care Team: Leotis Shames, MD as PCP - General (Family Medicine)  CHIEF COMPLAINT:  Chief Complaint  Patient presents with  . Follow-up    Is scheduled for possible Procrit and/or Venofer today...    INTERVAL HISTORY:  Patient is here for further follow up and evaluation of IDA. She overall feels well. She has some mild weakness. Reported feeling slightly nauseous this morning but relates it to taking all of her medications at once. Nausea has since resolved. Her ferritin and iron/ibc are pending. She states that Procrit injection makes her feel bad and prefers not to have it. She would like to wait until her iron level is back to determine if she can get iron infusions.   REVIEW OF SYSTEMS:   Review of Systems  Constitutional: Negative for fever and chills.  Respiratory: Negative for cough, shortness of breath and wheezing.   Cardiovascular: Negative for chest pain and leg swelling.  Gastrointestinal: Negative for nausea, vomiting, diarrhea and constipation.  Musculoskeletal: Negative for back pain, joint pain and neck pain.  Skin: Negative for itching and rash.  Neurological: Positive for weakness.    As per HPI. Otherwise, a complete review of systems is negatve.  ONCOLOGY HISTORY:  No history exists.    PAST MEDICAL HISTORY: Past Medical History  Diagnosis Date  . Hypertension   . Hyperlipidemia     PAST SURGICAL HISTORY: History reviewed. No pertinent past surgical history.  FAMILY HISTORY History reviewed. No pertinent family history.  GYNECOLOGIC HISTORY:  No LMP recorded. Patient is postmenopausal.     ADVANCED DIRECTIVES:    HEALTH MAINTENANCE: History  Substance Use Topics  . Smoking status: Never Smoker   . Smokeless tobacco: Not on file  . Alcohol Use: No     Colonoscopy:  PAP:  Bone  density:  Lipid panel:  Allergies  Allergen Reactions  . Aspirin Rash    Current Outpatient Prescriptions  Medication Sig Dispense Refill  . docusate sodium 100 MG CAPS Take 100 mg by mouth 2 (two) times daily. 60 capsule 0  . febuxostat (ULORIC) 40 MG tablet Take 80 mg by mouth daily.    . feeding supplement (RESOURCE BREEZE) LIQD Take 1 Container by mouth 3 (three) times daily between meals. 90 Container 0  . ferrous gluconate (FERGON) 324 MG tablet Take 324 mg by mouth 2 (two) times daily with a meal.    . fluconazole (DIFLUCAN) 200 MG tablet Take 1 tablet (200 mg total) by mouth daily. 14 tablet 0  . fluticasone (FLONASE) 50 MCG/ACT nasal spray Place into the nose.    . furosemide (LASIX) 20 MG tablet Take 20 mg by mouth daily.  11  . metoprolol tartrate (LOPRESSOR) 25 MG tablet Take 25 mg by mouth 2 (two) times daily.    . Multiple Vitamin (MULTIVITAMIN) tablet Take 1 tablet by mouth daily.    . Multiple Vitamins-Minerals (EYE VITAMINS & MINERALS) TABS Take 1 capsule by mouth 2 (two) times daily.    Marland Kitchen nystatin (MYCOSTATIN) 100000 UNIT/ML suspension Take 5 mLs (500,000 Units total) by mouth 4 (four) times daily. 120 mL 0  . oxybutynin (DITROPAN-XL) 5 MG 24 hr tablet Take 5 mg by mouth at bedtime.    . pantoprazole (PROTONIX) 40 MG tablet Take 40 mg by mouth daily before breakfast.    . Polyethyl Glycol-Propyl Glycol  0.4-0.3 % SOLN Apply 1 drop to eye daily.    . polyethylene glycol (MIRALAX / GLYCOLAX) packet Take 17 g by mouth daily as needed for moderate constipation.    . potassium chloride SA (K-DUR,KLOR-CON) 20 MEQ tablet Take 20 mEq by mouth daily.    Marland Kitchen triamcinolone cream (KENALOG) 0.1 %   2  . levofloxacin (LEVAQUIN) 500 MG tablet Take 1 tablet (500 mg total) by mouth daily. (Patient not taking: Reported on 03/20/2015) 5 tablet 0   No current facility-administered medications for this visit.    OBJECTIVE: BP 179/88 mmHg  Pulse 62  Temp(Src) 97.5 F (36.4 C) (Tympanic)   Resp 16  Wt 132 lb (59.875 kg)  SpO2 98%   Body mass index is 21.97 kg/(m^2).    ECOG FS:1 - Symptomatic but completely ambulatory  General: Well-developed, well-nourished, no acute distress. Eyes: Pink conjunctiva, anicteric sclera. Lungs: Clear to auscultation bilaterally. Heart: Regular rate and rhythm. No rubs, murmurs, or gallops. Abdomen: Soft, nontender, nondistended. No organomegaly noted, normoactive bowel sounds. Musculoskeletal: No edema, cyanosis, or clubbing. Neuro: Alert, answering all questions appropriately. Cranial nerves grossly intact. Skin: No rashes or petechiae noted. Psych: Normal affect.   LAB RESULTS:     Component Value Date/Time   NA 137 03/20/2015 1113   NA 140 03/14/2014 1150   K 3.9 03/20/2015 1113   K 4.1 04/11/2014 1037   CL 105 03/20/2015 1113   CL 104 03/14/2014 1150   CO2 25 03/20/2015 1113   CO2 28 03/14/2014 1150   GLUCOSE 111* 03/20/2015 1113   GLUCOSE 86 03/14/2014 1150   BUN 34* 03/20/2015 1113   BUN 35* 03/14/2014 1150   CREATININE 1.52* 03/20/2015 1113   CREATININE 1.63* 10/17/2014 1056   CALCIUM 8.8* 03/20/2015 1113   CALCIUM 8.8 03/14/2014 1150   PROT 9.1* 06/28/2013 1628   PROT 6.3 05/14/2013 0355   ALBUMIN 2.6* 06/28/2013 1628   ALBUMIN 1.8* 05/14/2013 0355   AST 16 06/28/2013 1628   AST 9 05/14/2013 0355   ALT 12 06/28/2013 1628   ALT <5 05/14/2013 0355   ALKPHOS 46 09/27/2013 0929   ALKPHOS 36* 05/14/2013 0355   BILITOT 0.3 05/14/2013 0355   GFRNONAA 33* 03/20/2015 1113   GFRNONAA 31* 06/20/2014 1356   GFRAA 38* 03/20/2015 1113   GFRAA 36* 06/20/2014 1356    No results found for: SPEP, UPEP  Lab Results  Component Value Date   WBC 12.6* 03/20/2015   NEUTROABS 8.2* 03/20/2015   HGB 9.4* 03/20/2015   HCT 31.1* 03/20/2015   MCV 80.0 03/20/2015   PLT 86* 03/20/2015      Chemistry      Component Value Date/Time   NA 137 03/20/2015 1113   NA 140 03/14/2014 1150   K 3.9 03/20/2015 1113   K 4.1 04/11/2014  1037   CL 105 03/20/2015 1113   CL 104 03/14/2014 1150   CO2 25 03/20/2015 1113   CO2 28 03/14/2014 1150   BUN 34* 03/20/2015 1113   BUN 35* 03/14/2014 1150   CREATININE 1.52* 03/20/2015 1113   CREATININE 1.63* 10/17/2014 1056      Component Value Date/Time   CALCIUM 8.8* 03/20/2015 1113   CALCIUM 8.8 03/14/2014 1150   ALKPHOS 46 09/27/2013 0929   ALKPHOS 36* 05/14/2013 0355   AST 16 06/28/2013 1628   AST 9 05/14/2013 0355   ALT 12 06/28/2013 1628   ALT <5 05/14/2013 0355   BILITOT 0.3 05/14/2013 0355  No results found for: LABCA2  No components found for: [id number]  No results for input(s): INR in the last 168 hours.     Component Value Date/Time   COLORURINE YELLOW 05/10/2013 1506   APPEARANCEUR CLOUDY* 05/10/2013 1506   LABSPEC 1.022 05/10/2013 1506   PHURINE 5.5 05/10/2013 1506   GLUCOSEU NEGATIVE 05/10/2013 1506   HGBUR NEGATIVE 05/10/2013 1506   BILIRUBINUR SMALL* 05/10/2013 1506   KETONESUR 15* 05/10/2013 1506   PROTEINUR 100* 05/10/2013 1506   UROBILINOGEN 1.0 05/10/2013 1506   NITRITE NEGATIVE 05/10/2013 1506   LEUKOCYTESUR NEGATIVE 05/10/2013 1506    STUDIES: No results found.  ASSESSMENT:  IDA.  PLAN:   1. IDA. Ferritin and iron/ IBC levels are pending from this morning. Will decide on treatment plan depending on values once tests are returned. Will go ahead and schedule for labs again in 2 weeks and md follow up with labs in 4 weeks.   Patient expressed understanding and was in agreement with this plan. She also understands that She can call clinic at any time with any questions, concerns, or complaints.   Dr. Grayland Ormond was available for consultation and review of plan of care for this patient.   Evlyn Kanner, NP   03/20/2015 12:19 PM

## 2015-03-26 ENCOUNTER — Ambulatory Visit: Payer: Medicare Other

## 2015-03-28 ENCOUNTER — Inpatient Hospital Stay: Payer: Medicare Other

## 2015-03-28 VITALS — BP 160/89 | HR 69 | Temp 98.0°F | Resp 18

## 2015-03-28 DIAGNOSIS — Z79899 Other long term (current) drug therapy: Secondary | ICD-10-CM | POA: Diagnosis not present

## 2015-03-28 DIAGNOSIS — I10 Essential (primary) hypertension: Secondary | ICD-10-CM | POA: Diagnosis not present

## 2015-03-28 DIAGNOSIS — D509 Iron deficiency anemia, unspecified: Secondary | ICD-10-CM

## 2015-03-28 DIAGNOSIS — R531 Weakness: Secondary | ICD-10-CM | POA: Diagnosis not present

## 2015-03-28 DIAGNOSIS — E785 Hyperlipidemia, unspecified: Secondary | ICD-10-CM | POA: Diagnosis not present

## 2015-03-28 MED ORDER — SODIUM CHLORIDE 0.9 % IV SOLN
INTRAVENOUS | Status: DC
  Administered 2015-03-28: 12:00:00 via INTRAVENOUS
  Filled 2015-03-28: qty 250

## 2015-03-28 MED ORDER — SODIUM CHLORIDE 0.9 % IV SOLN
200.0000 mg | Freq: Once | INTRAVENOUS | Status: AC
  Administered 2015-03-28: 200 mg via INTRAVENOUS
  Filled 2015-03-28: qty 10

## 2015-03-29 ENCOUNTER — Other Ambulatory Visit: Payer: Self-pay | Admitting: Internal Medicine

## 2015-04-03 ENCOUNTER — Other Ambulatory Visit: Payer: Medicare Other

## 2015-04-04 ENCOUNTER — Other Ambulatory Visit: Payer: Medicare Other

## 2015-04-05 ENCOUNTER — Inpatient Hospital Stay: Payer: Medicare Other

## 2015-04-05 DIAGNOSIS — E785 Hyperlipidemia, unspecified: Secondary | ICD-10-CM | POA: Diagnosis not present

## 2015-04-05 DIAGNOSIS — D509 Iron deficiency anemia, unspecified: Secondary | ICD-10-CM

## 2015-04-05 DIAGNOSIS — R531 Weakness: Secondary | ICD-10-CM | POA: Diagnosis not present

## 2015-04-05 DIAGNOSIS — Z79899 Other long term (current) drug therapy: Secondary | ICD-10-CM | POA: Diagnosis not present

## 2015-04-05 DIAGNOSIS — I10 Essential (primary) hypertension: Secondary | ICD-10-CM | POA: Diagnosis not present

## 2015-04-05 LAB — CBC WITH DIFFERENTIAL/PLATELET
BASOS ABS: 0 10*3/uL (ref 0–0.1)
Basophils Relative: 0 %
Eosinophils Absolute: 0 10*3/uL (ref 0–0.7)
Eosinophils Relative: 0 %
HCT: 31.7 % — ABNORMAL LOW (ref 35.0–47.0)
HEMOGLOBIN: 9.9 g/dL — AB (ref 12.0–16.0)
LYMPHS ABS: 1 10*3/uL (ref 1.0–3.6)
Lymphocytes Relative: 8 %
MCH: 24.6 pg — ABNORMAL LOW (ref 26.0–34.0)
MCHC: 31.3 g/dL — ABNORMAL LOW (ref 32.0–36.0)
MCV: 78.6 fL — AB (ref 80.0–100.0)
Monocytes Absolute: 2.6 10*3/uL — ABNORMAL HIGH (ref 0.2–0.9)
Monocytes Relative: 22 %
NEUTROS ABS: 8.2 10*3/uL — AB (ref 1.4–6.5)
NEUTROS PCT: 70 %
PLATELETS: 81 10*3/uL — AB (ref 150–440)
RBC: 4.03 MIL/uL (ref 3.80–5.20)
RDW: 20.5 % — AB (ref 11.5–14.5)
WBC: 11.9 10*3/uL — ABNORMAL HIGH (ref 3.6–11.0)

## 2015-04-05 LAB — IRON AND TIBC
Iron: 13 ug/dL — ABNORMAL LOW (ref 28–170)
Saturation Ratios: 9 % — ABNORMAL LOW (ref 10.4–31.8)
TIBC: 153 ug/dL — ABNORMAL LOW (ref 250–450)
UIBC: 140 ug/dL

## 2015-04-05 LAB — FERRITIN: FERRITIN: 524 ng/mL — AB (ref 11–307)

## 2015-04-11 DIAGNOSIS — Z5181 Encounter for therapeutic drug level monitoring: Secondary | ICD-10-CM | POA: Diagnosis not present

## 2015-04-11 DIAGNOSIS — I129 Hypertensive chronic kidney disease with stage 1 through stage 4 chronic kidney disease, or unspecified chronic kidney disease: Secondary | ICD-10-CM | POA: Diagnosis not present

## 2015-04-11 DIAGNOSIS — Z0184 Encounter for antibody response examination: Secondary | ICD-10-CM | POA: Diagnosis not present

## 2015-04-11 DIAGNOSIS — E039 Hypothyroidism, unspecified: Secondary | ICD-10-CM | POA: Diagnosis not present

## 2015-04-11 DIAGNOSIS — Z79899 Other long term (current) drug therapy: Secondary | ICD-10-CM | POA: Diagnosis not present

## 2015-04-11 DIAGNOSIS — K219 Gastro-esophageal reflux disease without esophagitis: Secondary | ICD-10-CM | POA: Diagnosis not present

## 2015-04-11 DIAGNOSIS — J029 Acute pharyngitis, unspecified: Secondary | ICD-10-CM | POA: Diagnosis not present

## 2015-04-11 DIAGNOSIS — N183 Chronic kidney disease, stage 3 (moderate): Secondary | ICD-10-CM | POA: Diagnosis not present

## 2015-04-24 ENCOUNTER — Inpatient Hospital Stay: Payer: Medicare Other | Attending: Family Medicine

## 2015-04-24 ENCOUNTER — Inpatient Hospital Stay (HOSPITAL_BASED_OUTPATIENT_CLINIC_OR_DEPARTMENT_OTHER): Payer: Medicare Other | Admitting: Family Medicine

## 2015-04-24 VITALS — BP 185/89 | HR 65 | Temp 98.4°F | Resp 16 | Wt 136.5 lb

## 2015-04-24 DIAGNOSIS — R609 Edema, unspecified: Secondary | ICD-10-CM | POA: Insufficient documentation

## 2015-04-24 DIAGNOSIS — I10 Essential (primary) hypertension: Secondary | ICD-10-CM | POA: Insufficient documentation

## 2015-04-24 DIAGNOSIS — C9250 Acute myelomonocytic leukemia, not having achieved remission: Secondary | ICD-10-CM

## 2015-04-24 DIAGNOSIS — D509 Iron deficiency anemia, unspecified: Secondary | ICD-10-CM | POA: Diagnosis not present

## 2015-04-24 DIAGNOSIS — R5383 Other fatigue: Secondary | ICD-10-CM

## 2015-04-24 DIAGNOSIS — D693 Immune thrombocytopenic purpura: Secondary | ICD-10-CM | POA: Diagnosis not present

## 2015-04-24 DIAGNOSIS — Z79899 Other long term (current) drug therapy: Secondary | ICD-10-CM

## 2015-04-24 DIAGNOSIS — R531 Weakness: Secondary | ICD-10-CM | POA: Diagnosis not present

## 2015-04-24 DIAGNOSIS — C9210 Chronic myeloid leukemia, BCR/ABL-positive, not having achieved remission: Secondary | ICD-10-CM | POA: Diagnosis not present

## 2015-04-24 DIAGNOSIS — E785 Hyperlipidemia, unspecified: Secondary | ICD-10-CM | POA: Diagnosis not present

## 2015-04-24 LAB — CBC WITH DIFFERENTIAL/PLATELET
BASOS PCT: 0 %
Basophils Absolute: 0 10*3/uL (ref 0–0.1)
Eosinophils Absolute: 0.2 10*3/uL (ref 0–0.7)
Eosinophils Relative: 2 %
HEMATOCRIT: 30.7 % — AB (ref 35.0–47.0)
Hemoglobin: 9.7 g/dL — ABNORMAL LOW (ref 12.0–16.0)
Lymphocytes Relative: 10 %
Lymphs Abs: 1.1 10*3/uL (ref 1.0–3.6)
MCH: 24.6 pg — AB (ref 26.0–34.0)
MCHC: 31.6 g/dL — ABNORMAL LOW (ref 32.0–36.0)
MCV: 78 fL — AB (ref 80.0–100.0)
Monocytes Absolute: 2.9 10*3/uL — ABNORMAL HIGH (ref 0.2–0.9)
Monocytes Relative: 27 %
NEUTROS ABS: 6.5 10*3/uL (ref 1.4–6.5)
NEUTROS PCT: 61 %
Platelets: 76 10*3/uL — ABNORMAL LOW (ref 150–440)
RBC: 3.94 MIL/uL (ref 3.80–5.20)
RDW: 20.7 % — ABNORMAL HIGH (ref 11.5–14.5)
WBC: 10.7 10*3/uL (ref 3.6–11.0)

## 2015-04-24 LAB — IRON AND TIBC
Iron: 20 ug/dL — ABNORMAL LOW (ref 28–170)
SATURATION RATIOS: 12 % (ref 10.4–31.8)
TIBC: 167 ug/dL — AB (ref 250–450)
UIBC: 147 ug/dL

## 2015-04-24 LAB — FERRITIN: Ferritin: 373 ng/mL — ABNORMAL HIGH (ref 11–307)

## 2015-04-24 NOTE — Progress Notes (Signed)
[place]  [phone]  Fax:(336) [place] DOB: 29-Mar-1941  MR#: [id number]  HYI#:[id number]  Patient Care Team: Leotis Shames, MD as PCP - General (Family Medicine)  CHIEF COMPLAINT:  Chief Complaint  Patient presents with  . Follow-up   patient is here for follow-up regarding ITP , chronic myelomonocytic leukemia. Iron deficiency anemia, status post multiple iron infusions.  INTERVAL HISTORY:  Patient is here for continued evaluation and treatment consideration regarding ITP and chronic myelomonocytic leukemia. Patient reports overall feeling well. She has some chronic weakness and fatigue which is normal for her. She also reports some mild lower extremity edema due to increased salt intake. Today her blood pressure is elevated, she reports having taken her medications this morning but has had high sodium foods and that causes her blood pressure to go up.  REVIEW OF SYSTEMS:   Review of Systems  Constitutional: Positive for malaise/fatigue. Negative for fever, chills, weight loss and diaphoresis.       Chronic  HENT: Negative for congestion, ear discharge, ear pain, hearing loss, nosebleeds, sore throat and tinnitus.   Eyes: Negative for blurred vision, double vision, photophobia, pain, discharge and redness.  Respiratory: Negative for cough, hemoptysis, sputum production, shortness of breath, wheezing and stridor.   Cardiovascular: Negative for chest pain, palpitations, orthopnea, claudication, leg swelling and PND.  Gastrointestinal: Negative for heartburn, nausea, vomiting, abdominal pain, diarrhea, constipation, blood in stool and melena.  Genitourinary: Negative.   Musculoskeletal: Negative.   Skin: Negative.   Neurological: Positive for weakness. Negative for dizziness, tingling, focal weakness, seizures and headaches.  Endo/Heme/Allergies: Does not bruise/bleed easily.  Psychiatric/Behavioral: Negative for depression. The  patient is not nervous/anxious and does not have insomnia.     As per HPI. Otherwise, a complete review of systems is negatve.  ONCOLOGY HISTORY:  No history exists.    PAST MEDICAL HISTORY: Past Medical History  Diagnosis Date  . Hypertension   . Hyperlipidemia     PAST SURGICAL HISTORY: No past surgical history on file.  FAMILY HISTORY No family history on file.  GYNECOLOGIC HISTORY:  No LMP recorded. Patient is postmenopausal.     ADVANCED DIRECTIVES:    HEALTH MAINTENANCE: History  Substance Use Topics  . Smoking status: Never Smoker   . Smokeless tobacco: Not on file  . Alcohol Use: No     Colonoscopy:  PAP:  Bone density:  Lipid panel:  Allergies  Allergen Reactions  . Aspirin Rash    Current Outpatient Prescriptions  Medication Sig Dispense Refill  . docusate sodium 100 MG CAPS Take 100 mg by mouth 2 (two) times daily. 60 capsule 0  . febuxostat (ULORIC) 40 MG tablet Take 80 mg by mouth daily.    . feeding supplement (RESOURCE BREEZE) LIQD Take 1 Container by mouth 3 (three) times daily between meals. 90 Container 0  . ferrous gluconate (FERGON) 324 MG tablet Take 324 mg by mouth 2 (two) times daily with a meal.    . furosemide (LASIX) 20 MG tablet Take 20 mg by mouth daily.  11  . metoprolol tartrate (LOPRESSOR) 25 MG tablet Take 25 mg by mouth 2 (two) times daily.    . Multiple Vitamin (MULTIVITAMIN) tablet Take 1 tablet by mouth daily.    . Multiple Vitamins-Minerals (EYE VITAMINS & MINERALS) TABS Take 1 capsule by mouth 2 (two) times daily.    Marland Kitchen oxybutynin (DITROPAN) 5 MG tablet Take 5 mg by mouth daily.  5  . pantoprazole (PROTONIX) 40 MG tablet Take 40 mg by mouth daily before breakfast.    . polyethylene glycol (MIRALAX / GLYCOLAX) packet Take 17 g by mouth daily as needed for moderate constipation.    . potassium chloride SA (K-DUR,KLOR-CON) 20 MEQ tablet TAKE 1 TABLET BY MOUTH EVERY DAY 30 tablet 0  . triamcinolone cream (KENALOG) 0.1 %   2    . fluconazole (DIFLUCAN) 200 MG tablet Take 1 tablet (200 mg total) by mouth daily. (Patient not taking: Reported on 04/24/2015) 14 tablet 0  . fluticasone (FLONASE) 50 MCG/ACT nasal spray Place into the nose.    . levofloxacin (LEVAQUIN) 500 MG tablet Take 1 tablet (500 mg total) by mouth daily. (Patient not taking: Reported on 03/20/2015) 5 tablet 0  . nystatin (MYCOSTATIN) 100000 UNIT/ML suspension Take 5 mLs (500,000 Units total) by mouth 4 (four) times daily. (Patient not taking: Reported on 04/24/2015) 120 mL 0  . oxybutynin (DITROPAN-XL) 5 MG 24 hr tablet Take 5 mg by mouth at bedtime.    Vladimir Faster Glycol-Propyl Glycol 0.4-0.3 % SOLN Apply 1 drop to eye daily.     No current facility-administered medications for this visit.    OBJECTIVE: BP 185/89 mmHg  Pulse 65  Temp(Src) 98.4 F (36.9 C) (Tympanic)  Resp 16  Wt 136 lb 7.4 oz (61.899 kg)  SpO2 99%   Body mass index is 22.71 kg/(m^2).    ECOG FS:1 - Symptomatic but completely ambulatory  General: Well-developed, well-nourished, no acute distress. Eyes: Pink conjunctiva, anicteric sclera. HEENT: Normocephalic, moist mucous membranes, clear oropharnyx. Lungs: Clear to auscultation bilaterally. Heart: Regular rate and rhythm. No rubs, murmurs, or gallops. Musculoskeletal: 1+ lower extremity  edema, cyanosis, or clubbing. Neuro: Alert, answering all questions appropriately. Cranial nerves grossly intact. Skin: No rashes or petechiae noted. Psych: Normal affect. Lymphatics: No cervical, calvicular, axillary or inguinal LAD.   LAB RESULTS:  Appointment on 04/24/2015  Component Date Value Ref Range Status  . WBC 04/24/2015 10.7  3.6 - 11.0 K/uL Final  . RBC 04/24/2015 3.94  3.80 - 5.20 MIL/uL Final  . Hemoglobin 04/24/2015 9.7* 12.0 - 16.0 g/dL Final  . HCT 04/24/2015 30.7* 35.0 - 47.0 % Final  . MCV 04/24/2015 78.0* 80.0 - 100.0 fL Final  . MCH 04/24/2015 24.6* 26.0 - 34.0 pg Final  . MCHC 04/24/2015 31.6* 32.0 - 36.0 g/dL  Final  . RDW 04/24/2015 20.7* 11.5 - 14.5 % Final  . Platelets 04/24/2015 76* 150 - 440 K/uL Final  . Neutrophils Relative % 04/24/2015 61   Final  . Lymphocytes Relative 04/24/2015 10   Final  . Monocytes Relative 04/24/2015 27   Final  . Eosinophils Relative 04/24/2015 2   Final  . Basophils Relative 04/24/2015 0   Final  . Neutro Abs 04/24/2015 6.5  1.4 - 6.5 K/uL Final  . Lymphs Abs 04/24/2015 1.1  1.0 - 3.6 K/uL Final  . Monocytes Absolute 04/24/2015 2.9* 0.2 - 0.9 K/uL Final  . Eosinophils Absolute 04/24/2015 0.2  0 - 0.7 K/uL Final  . Basophils Absolute 04/24/2015 0.0  0 - 0.1 K/uL Final    STUDIES: No results found.  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 CML. Patient overall reports feeling very well. Currently stable, WBC 2.7, hemoglobin 9.7, platelets 76k. Iron studies also reported as stable for patient. The patient will not receive Procrit due to elevated blood pressure at this time.  We'll  continue with labs follow-up in 3 weeks into see a provider with labs again in 6 weeks.   Patient expressed understanding and was in agreement with this plan. She also understands that She can call clinic at any time with any questions, concerns, or complaints.   Dr. Oliva Bustard was available for consultation and review of plan of care for this patient.   Evlyn Kanner, NP   04/24/2015 12:18 PM

## 2015-05-15 ENCOUNTER — Inpatient Hospital Stay: Payer: Medicare Other | Attending: Family Medicine

## 2015-05-15 DIAGNOSIS — C9210 Chronic myeloid leukemia, BCR/ABL-positive, not having achieved remission: Secondary | ICD-10-CM | POA: Insufficient documentation

## 2015-05-15 DIAGNOSIS — C9250 Acute myelomonocytic leukemia, not having achieved remission: Secondary | ICD-10-CM

## 2015-05-15 LAB — CBC WITH DIFFERENTIAL/PLATELET
Basophils Absolute: 0.1 10*3/uL (ref 0–0.1)
Basophils Relative: 1 %
EOS ABS: 0.1 10*3/uL (ref 0–0.7)
Eosinophils Relative: 1 %
HCT: 32.3 % — ABNORMAL LOW (ref 35.0–47.0)
HEMOGLOBIN: 10.3 g/dL — AB (ref 12.0–16.0)
LYMPHS PCT: 10 %
Lymphs Abs: 1.4 10*3/uL (ref 1.0–3.6)
MCH: 25 pg — ABNORMAL LOW (ref 26.0–34.0)
MCHC: 31.8 g/dL — ABNORMAL LOW (ref 32.0–36.0)
MCV: 78.5 fL — ABNORMAL LOW (ref 80.0–100.0)
MONO ABS: 2.3 10*3/uL — AB (ref 0.2–0.9)
Monocytes Relative: 17 %
Neutro Abs: 9.6 10*3/uL — ABNORMAL HIGH (ref 1.4–6.5)
Neutrophils Relative %: 71 %
Platelets: 54 10*3/uL — ABNORMAL LOW (ref 150–440)
RBC: 4.12 MIL/uL (ref 3.80–5.20)
RDW: 21.3 % — ABNORMAL HIGH (ref 11.5–14.5)
WBC: 13.5 10*3/uL — ABNORMAL HIGH (ref 3.6–11.0)

## 2015-05-15 LAB — IRON AND TIBC
IRON: 16 ug/dL — AB (ref 28–170)
SATURATION RATIOS: 10 % — AB (ref 10.4–31.8)
TIBC: 169 ug/dL — AB (ref 250–450)
UIBC: 153 ug/dL

## 2015-05-15 LAB — FERRITIN: Ferritin: 495 ng/mL — ABNORMAL HIGH (ref 11–307)

## 2015-05-22 DIAGNOSIS — M25511 Pain in right shoulder: Secondary | ICD-10-CM | POA: Diagnosis not present

## 2015-05-22 DIAGNOSIS — R0789 Other chest pain: Secondary | ICD-10-CM | POA: Diagnosis not present

## 2015-05-22 DIAGNOSIS — M25512 Pain in left shoulder: Secondary | ICD-10-CM | POA: Diagnosis not present

## 2015-05-22 DIAGNOSIS — R319 Hematuria, unspecified: Secondary | ICD-10-CM | POA: Diagnosis not present

## 2015-05-24 DIAGNOSIS — M9903 Segmental and somatic dysfunction of lumbar region: Secondary | ICD-10-CM | POA: Diagnosis not present

## 2015-05-24 DIAGNOSIS — M5412 Radiculopathy, cervical region: Secondary | ICD-10-CM | POA: Diagnosis not present

## 2015-05-24 DIAGNOSIS — M5136 Other intervertebral disc degeneration, lumbar region: Secondary | ICD-10-CM | POA: Diagnosis not present

## 2015-05-24 DIAGNOSIS — M9901 Segmental and somatic dysfunction of cervical region: Secondary | ICD-10-CM | POA: Diagnosis not present

## 2015-05-27 ENCOUNTER — Emergency Department
Admission: EM | Admit: 2015-05-27 | Discharge: 2015-05-27 | Disposition: A | Discharge status: Other Acute Inpatient Hospital | Payer: Medicare Other | Attending: Emergency Medicine | Admitting: Emergency Medicine

## 2015-05-27 ENCOUNTER — Inpatient Hospital Stay (HOSPITAL_COMMUNITY): Payer: Medicare Other

## 2015-05-27 ENCOUNTER — Inpatient Hospital Stay (HOSPITAL_COMMUNITY)
Admission: AD | Admit: 2015-05-27 | Discharge: 2015-06-07 | DRG: 853 | Disposition: A | Discharge status: Skilled Nursing Facility | Payer: Medicare Other | Source: Other Acute Inpatient Hospital | Attending: Internal Medicine | Admitting: Internal Medicine

## 2015-05-27 ENCOUNTER — Other Ambulatory Visit: Payer: Self-pay

## 2015-05-27 ENCOUNTER — Emergency Department: Payer: Medicare Other

## 2015-05-27 DIAGNOSIS — L089 Local infection of the skin and subcutaneous tissue, unspecified: Secondary | ICD-10-CM | POA: Diagnosis not present

## 2015-05-27 DIAGNOSIS — M19012 Primary osteoarthritis, left shoulder: Secondary | ICD-10-CM | POA: Diagnosis not present

## 2015-05-27 DIAGNOSIS — M7989 Other specified soft tissue disorders: Secondary | ICD-10-CM | POA: Diagnosis present

## 2015-05-27 DIAGNOSIS — N390 Urinary tract infection, site not specified: Secondary | ICD-10-CM | POA: Diagnosis present

## 2015-05-27 DIAGNOSIS — A4151 Sepsis due to Escherichia coli [E. coli]: Principal | ICD-10-CM | POA: Diagnosis present

## 2015-05-27 DIAGNOSIS — J9601 Acute respiratory failure with hypoxia: Secondary | ICD-10-CM | POA: Diagnosis not present

## 2015-05-27 DIAGNOSIS — I129 Hypertensive chronic kidney disease with stage 1 through stage 4 chronic kidney disease, or unspecified chronic kidney disease: Secondary | ICD-10-CM | POA: Diagnosis present

## 2015-05-27 DIAGNOSIS — R5383 Other fatigue: Secondary | ICD-10-CM

## 2015-05-27 DIAGNOSIS — R1311 Dysphagia, oral phase: Secondary | ICD-10-CM | POA: Diagnosis not present

## 2015-05-27 DIAGNOSIS — Z79899 Other long term (current) drug therapy: Secondary | ICD-10-CM | POA: Diagnosis not present

## 2015-05-27 DIAGNOSIS — J9600 Acute respiratory failure, unspecified whether with hypoxia or hypercapnia: Secondary | ICD-10-CM | POA: Diagnosis not present

## 2015-05-27 DIAGNOSIS — M5022 Other cervical disc displacement, mid-cervical region: Secondary | ICD-10-CM | POA: Diagnosis not present

## 2015-05-27 DIAGNOSIS — D696 Thrombocytopenia, unspecified: Secondary | ICD-10-CM | POA: Diagnosis not present

## 2015-05-27 DIAGNOSIS — M109 Gout, unspecified: Secondary | ICD-10-CM | POA: Diagnosis present

## 2015-05-27 DIAGNOSIS — M47812 Spondylosis without myelopathy or radiculopathy, cervical region: Secondary | ICD-10-CM | POA: Diagnosis not present

## 2015-05-27 DIAGNOSIS — R32 Unspecified urinary incontinence: Secondary | ICD-10-CM | POA: Diagnosis present

## 2015-05-27 DIAGNOSIS — N183 Chronic kidney disease, stage 3 unspecified: Secondary | ICD-10-CM | POA: Diagnosis present

## 2015-05-27 DIAGNOSIS — M6510 Other infective (teno)synovitis, unspecified site: Secondary | ICD-10-CM | POA: Diagnosis present

## 2015-05-27 DIAGNOSIS — R000 Tachycardia, unspecified: Secondary | ICD-10-CM | POA: Insufficient documentation

## 2015-05-27 DIAGNOSIS — I5033 Acute on chronic diastolic (congestive) heart failure: Secondary | ICD-10-CM | POA: Diagnosis present

## 2015-05-27 DIAGNOSIS — M25521 Pain in right elbow: Secondary | ICD-10-CM | POA: Diagnosis present

## 2015-05-27 DIAGNOSIS — A419 Sepsis, unspecified organism: Secondary | ICD-10-CM | POA: Insufficient documentation

## 2015-05-27 DIAGNOSIS — G9340 Encephalopathy, unspecified: Secondary | ICD-10-CM | POA: Diagnosis present

## 2015-05-27 DIAGNOSIS — M25431 Effusion, right wrist: Secondary | ICD-10-CM | POA: Diagnosis not present

## 2015-05-27 DIAGNOSIS — E43 Unspecified severe protein-calorie malnutrition: Secondary | ICD-10-CM | POA: Diagnosis present

## 2015-05-27 DIAGNOSIS — R7881 Bacteremia: Secondary | ICD-10-CM | POA: Diagnosis present

## 2015-05-27 DIAGNOSIS — M00832 Arthritis due to other bacteria, left wrist: Secondary | ICD-10-CM | POA: Diagnosis not present

## 2015-05-27 DIAGNOSIS — D630 Anemia in neoplastic disease: Secondary | ICD-10-CM | POA: Diagnosis present

## 2015-05-27 DIAGNOSIS — J9690 Respiratory failure, unspecified, unspecified whether with hypoxia or hypercapnia: Secondary | ICD-10-CM | POA: Diagnosis not present

## 2015-05-27 DIAGNOSIS — N179 Acute kidney failure, unspecified: Secondary | ICD-10-CM | POA: Diagnosis not present

## 2015-05-27 DIAGNOSIS — M009 Pyogenic arthritis, unspecified: Secondary | ICD-10-CM | POA: Diagnosis not present

## 2015-05-27 DIAGNOSIS — D509 Iron deficiency anemia, unspecified: Secondary | ICD-10-CM | POA: Diagnosis present

## 2015-05-27 DIAGNOSIS — R2681 Unsteadiness on feet: Secondary | ICD-10-CM | POA: Diagnosis not present

## 2015-05-27 DIAGNOSIS — C9310 Chronic myelomonocytic leukemia not having achieved remission: Secondary | ICD-10-CM | POA: Diagnosis present

## 2015-05-27 DIAGNOSIS — A029 Salmonella infection, unspecified: Secondary | ICD-10-CM | POA: Diagnosis not present

## 2015-05-27 DIAGNOSIS — G9341 Metabolic encephalopathy: Secondary | ICD-10-CM | POA: Diagnosis present

## 2015-05-27 DIAGNOSIS — C9110 Chronic lymphocytic leukemia of B-cell type not having achieved remission: Secondary | ICD-10-CM | POA: Diagnosis not present

## 2015-05-27 DIAGNOSIS — M1811 Unilateral primary osteoarthritis of first carpometacarpal joint, right hand: Secondary | ICD-10-CM | POA: Diagnosis not present

## 2015-05-27 DIAGNOSIS — K219 Gastro-esophageal reflux disease without esophagitis: Secondary | ICD-10-CM | POA: Diagnosis present

## 2015-05-27 DIAGNOSIS — R51 Headache: Secondary | ICD-10-CM | POA: Diagnosis not present

## 2015-05-27 DIAGNOSIS — D638 Anemia in other chronic diseases classified elsewhere: Secondary | ICD-10-CM | POA: Diagnosis not present

## 2015-05-27 DIAGNOSIS — R262 Difficulty in walking, not elsewhere classified: Secondary | ICD-10-CM | POA: Diagnosis not present

## 2015-05-27 DIAGNOSIS — M542 Cervicalgia: Secondary | ICD-10-CM | POA: Diagnosis present

## 2015-05-27 DIAGNOSIS — D729 Disorder of white blood cells, unspecified: Secondary | ICD-10-CM | POA: Diagnosis not present

## 2015-05-27 DIAGNOSIS — Z9889 Other specified postprocedural states: Secondary | ICD-10-CM | POA: Diagnosis not present

## 2015-05-27 DIAGNOSIS — M6281 Muscle weakness (generalized): Secondary | ICD-10-CM | POA: Diagnosis not present

## 2015-05-27 DIAGNOSIS — R509 Fever, unspecified: Secondary | ICD-10-CM | POA: Diagnosis present

## 2015-05-27 DIAGNOSIS — I5030 Unspecified diastolic (congestive) heart failure: Secondary | ICD-10-CM | POA: Diagnosis not present

## 2015-05-27 DIAGNOSIS — R1310 Dysphagia, unspecified: Secondary | ICD-10-CM | POA: Diagnosis present

## 2015-05-27 DIAGNOSIS — E785 Hyperlipidemia, unspecified: Secondary | ICD-10-CM | POA: Diagnosis present

## 2015-05-27 DIAGNOSIS — M25512 Pain in left shoulder: Secondary | ICD-10-CM | POA: Diagnosis present

## 2015-05-27 DIAGNOSIS — R278 Other lack of coordination: Secondary | ICD-10-CM | POA: Diagnosis not present

## 2015-05-27 DIAGNOSIS — D6959 Other secondary thrombocytopenia: Secondary | ICD-10-CM | POA: Diagnosis present

## 2015-05-27 DIAGNOSIS — D693 Immune thrombocytopenic purpura: Secondary | ICD-10-CM

## 2015-05-27 DIAGNOSIS — R2231 Localized swelling, mass and lump, right upper limb: Secondary | ICD-10-CM | POA: Diagnosis not present

## 2015-05-27 DIAGNOSIS — D47Z9 Other specified neoplasms of uncertain behavior of lymphoid, hematopoietic and related tissue: Secondary | ICD-10-CM | POA: Diagnosis not present

## 2015-05-27 DIAGNOSIS — R410 Disorientation, unspecified: Secondary | ICD-10-CM | POA: Diagnosis not present

## 2015-05-27 DIAGNOSIS — M19011 Primary osteoarthritis, right shoulder: Secondary | ICD-10-CM | POA: Diagnosis present

## 2015-05-27 DIAGNOSIS — I509 Heart failure, unspecified: Secondary | ICD-10-CM | POA: Diagnosis not present

## 2015-05-27 DIAGNOSIS — D72829 Elevated white blood cell count, unspecified: Secondary | ICD-10-CM | POA: Diagnosis not present

## 2015-05-27 DIAGNOSIS — S12100A Unspecified displaced fracture of second cervical vertebra, initial encounter for closed fracture: Secondary | ICD-10-CM

## 2015-05-27 DIAGNOSIS — I272 Other secondary pulmonary hypertension: Secondary | ICD-10-CM | POA: Diagnosis present

## 2015-05-27 DIAGNOSIS — J810 Acute pulmonary edema: Secondary | ICD-10-CM

## 2015-05-27 DIAGNOSIS — M25511 Pain in right shoulder: Secondary | ICD-10-CM | POA: Diagnosis not present

## 2015-05-27 DIAGNOSIS — J9691 Respiratory failure, unspecified with hypoxia: Secondary | ICD-10-CM | POA: Diagnosis not present

## 2015-05-27 DIAGNOSIS — A021 Salmonella sepsis: Secondary | ICD-10-CM | POA: Diagnosis not present

## 2015-05-27 DIAGNOSIS — R52 Pain, unspecified: Secondary | ICD-10-CM

## 2015-05-27 DIAGNOSIS — M21931 Unspecified acquired deformity of right forearm: Secondary | ICD-10-CM | POA: Diagnosis not present

## 2015-05-27 DIAGNOSIS — M00831 Arthritis due to other bacteria, right wrist: Secondary | ICD-10-CM | POA: Diagnosis not present

## 2015-05-27 DIAGNOSIS — G92 Toxic encephalopathy: Secondary | ICD-10-CM | POA: Diagnosis not present

## 2015-05-27 DIAGNOSIS — R4182 Altered mental status, unspecified: Secondary | ICD-10-CM | POA: Diagnosis not present

## 2015-05-27 LAB — URINALYSIS COMPLETE WITH MICROSCOPIC (ARMC ONLY)
BILIRUBIN URINE: NEGATIVE
Glucose, UA: NEGATIVE mg/dL
Ketones, ur: NEGATIVE mg/dL
Nitrite: NEGATIVE
Protein, ur: 30 mg/dL — AB
SPECIFIC GRAVITY, URINE: 1.013 (ref 1.005–1.030)
pH: 5 (ref 5.0–8.0)

## 2015-05-27 LAB — LACTIC ACID, PLASMA: Lactic Acid, Venous: 3.3 mmol/L (ref 0.5–2.0)

## 2015-05-27 LAB — COMPREHENSIVE METABOLIC PANEL
ALBUMIN: 2.7 g/dL — AB (ref 3.5–5.0)
ALK PHOS: 59 U/L (ref 38–126)
ALT: 18 U/L (ref 14–54)
AST: 36 U/L (ref 15–41)
Anion gap: 12 (ref 5–15)
BUN: 75 mg/dL — AB (ref 6–20)
CO2: 20 mmol/L — ABNORMAL LOW (ref 22–32)
Calcium: 9 mg/dL (ref 8.9–10.3)
Chloride: 110 mmol/L (ref 101–111)
Creatinine, Ser: 1.75 mg/dL — ABNORMAL HIGH (ref 0.44–1.00)
GFR, EST AFRICAN AMERICAN: 32 mL/min — AB (ref >60)
GFR, EST NON AFRICAN AMERICAN: 28 mL/min — AB (ref >60)
Glucose, Bld: 121 mg/dL — ABNORMAL HIGH (ref 65–99)
Potassium: 4.1 mmol/L (ref 3.5–5.1)
Sodium: 142 mmol/L (ref 135–145)
TOTAL PROTEIN: 7.4 g/dL (ref 6.5–8.1)
Total Bilirubin: 1.9 mg/dL — ABNORMAL HIGH (ref 0.3–1.2)

## 2015-05-27 LAB — CBC WITH DIFFERENTIAL/PLATELET
BLASTS: 0 %
Band Neutrophils: 1 % (ref 0–10)
Basophils Relative: 0 % (ref 0–1)
Eosinophils Relative: 0 % (ref 0–5)
HEMATOCRIT: 28.4 % — AB (ref 35.0–47.0)
HEMOGLOBIN: 8.8 g/dL — AB (ref 12.0–16.0)
Lymphocytes Relative: 10 % — ABNORMAL LOW (ref 12–46)
MCH: 24.4 pg — ABNORMAL LOW (ref 26.0–34.0)
MCHC: 31.1 g/dL — ABNORMAL LOW (ref 32.0–36.0)
MCV: 78.4 fL — ABNORMAL LOW (ref 80.0–100.0)
MYELOCYTES: 2 %
Metamyelocytes Relative: 3 %
Monocytes Relative: 16 % — ABNORMAL HIGH (ref 3–12)
NRBC: 0 /100{WBCs}
Neutrophils Relative %: 68 % (ref 43–77)
Other: 0 %
PROMYELOCYTES ABS: 0 %
Platelets: 20 10*3/uL — CL (ref 150–440)
RBC: 3.63 MIL/uL — ABNORMAL LOW (ref 3.80–5.20)
RDW: 21.6 % — AB (ref 11.5–14.5)
WBC: 38.8 10*3/uL — ABNORMAL HIGH (ref 3.6–11.0)

## 2015-05-27 LAB — BLOOD GAS, VENOUS
ACID-BASE DEFICIT: 2.4 mmol/L — AB (ref 0.0–2.0)
Bicarbonate: 21.6 mEq/L (ref 21.0–28.0)
FIO2: 0.21
PH VEN: 7.41 (ref 7.320–7.430)
pCO2, Ven: 34 mmHg — ABNORMAL LOW (ref 44.0–60.0)

## 2015-05-27 LAB — MRSA PCR SCREENING: MRSA BY PCR: NEGATIVE

## 2015-05-27 LAB — GLUCOSE, CAPILLARY: Glucose-Capillary: 114 mg/dL — ABNORMAL HIGH (ref 65–99)

## 2015-05-27 LAB — PROTIME-INR
INR: 1.5
Prothrombin Time: 18.3 seconds — ABNORMAL HIGH (ref 11.4–15.0)

## 2015-05-27 LAB — TROPONIN I: Troponin I: 0.03 ng/mL (ref <0.031)

## 2015-05-27 MED ORDER — CHLORHEXIDINE GLUCONATE 0.12 % MT SOLN
15.0000 mL | Freq: Two times a day (BID) | OROMUCOSAL | Status: DC
  Administered 2015-05-27 – 2015-06-03 (×15): 15 mL via OROMUCOSAL
  Filled 2015-05-27 (×14): qty 15

## 2015-05-27 MED ORDER — VANCOMYCIN HCL IN DEXTROSE 1-5 GM/200ML-% IV SOLN
1000.0000 mg | Freq: Once | INTRAVENOUS | Status: AC
Start: 2015-05-27 — End: 2015-05-27
  Administered 2015-05-27: 1000 mg via INTRAVENOUS
  Filled 2015-05-27: qty 200

## 2015-05-27 MED ORDER — FUROSEMIDE 10 MG/ML IJ SOLN
20.0000 mg | Freq: Once | INTRAMUSCULAR | Status: AC
Start: 2015-05-27 — End: 2015-05-27
  Administered 2015-05-27: 20 mg via INTRAVENOUS
  Filled 2015-05-27: qty 2

## 2015-05-27 MED ORDER — LABETALOL HCL 5 MG/ML IV SOLN: 10.0000 mg | INTRAVENOUS | Status: DC | PRN

## 2015-05-27 MED ORDER — ONDANSETRON HCL 4 MG/2ML IJ SOLN: 4.0000 mg | Freq: Four times a day (QID) | INTRAMUSCULAR | Status: DC | PRN

## 2015-05-27 MED ORDER — CETYLPYRIDINIUM CHLORIDE 0.05 % MT LIQD
7.0000 mL | Freq: Two times a day (BID) | OROMUCOSAL | Status: DC
  Administered 2015-05-28 – 2015-06-03 (×9): 7 mL via OROMUCOSAL

## 2015-05-27 MED ORDER — ACETAMINOPHEN 650 MG RE SUPP
650.0000 mg | Freq: Four times a day (QID) | RECTAL | Status: DC | PRN
  Administered 2015-05-27: 650 mg via RECTAL
  Filled 2015-05-27: qty 1

## 2015-05-27 MED ORDER — SODIUM CHLORIDE 0.9 % IV SOLN
250.0000 mL | INTRAVENOUS | Status: DC | PRN
  Administered 2015-05-27: 250 mL via INTRAVENOUS

## 2015-05-27 MED ORDER — ACETAMINOPHEN 325 MG PO TABS
650.0000 mg | ORAL_TABLET | ORAL | Status: DC | PRN
  Administered 2015-05-28 – 2015-06-07 (×27): 650 mg via ORAL
  Filled 2015-05-27 (×30): qty 2

## 2015-05-27 MED ORDER — PIPERACILLIN SOD-TAZOBACTAM SO 3.375 (3-0.375) G IV SOLR
INTRAVENOUS | Status: AC
  Filled 2015-05-27: qty 3.38

## 2015-05-27 MED ORDER — DEXTROSE 5 % IV SOLN
1.0000 g | INTRAVENOUS | Status: DC
  Administered 2015-05-27 – 2015-05-30 (×4): 1 g via INTRAVENOUS
  Filled 2015-05-27 (×5): qty 10

## 2015-05-27 MED ORDER — ACETAMINOPHEN 650 MG RE SUPP: 650.0000 mg | RECTAL | Status: DC | PRN

## 2015-05-27 MED ORDER — SODIUM CHLORIDE 0.9 % IV BOLUS (SEPSIS)
1000.0000 mL | INTRAVENOUS | Status: AC
  Administered 2015-05-27 (×2): 1000 mL via INTRAVENOUS

## 2015-05-27 MED ORDER — PIPERACILLIN-TAZOBACTAM 3.375 G IVPB 30 MIN
3.3750 g | Freq: Once | INTRAVENOUS | Status: AC
  Administered 2015-05-27: 3.375 g via INTRAVENOUS

## 2015-05-27 NOTE — Progress Notes (Signed)
Per Dr. Tamala Julian in [place], ok to give pt BiPAP holiday and place on [place]. RN will continue to monitor pt closely.

## 2015-05-27 NOTE — ED Notes (Signed)
Called Phil at [facility] for code sepsis [id number]

## 2015-05-27 NOTE — H&P (Signed)
PULMONARY / CRITICAL CARE MEDICINE   Name: [name] MRN: [id number] DOB: [birth year]    ADMISSION DATE:  05/27/2015 CONSULTATION DATE:  05/27/2015  REFERRING MD :  Irwin Brakeman  CHIEF COMPLAINT:  Confusion and fever  INITIAL PRESENTATION: [facility]  STUDIES:  Chest x-ray with prominent interstitial markings, EKG was reviewed as listed below, x-ray of her right wrist was ordered  SIGNIFICANT EVENTS: Patient was taken by her son to [facility] emergency department for altered mental status   HISTORY OF PRESENT ILLNESS:  Patient is a very pleasant 74 year old African-American female with past medical history significant for CML, ITP, anemia of chronic disease, hypertension, hyperlipidemia, chronic diastolic heart failure on echocardiogram in 2014, who was taken to the emergency department by her son Dallas Breeding, for altered mental status. Patient had been in her usual state of health when this morning her son, who she lives with, noticed that she quite wasn't herself with confusion, inattentiveness, drowsiness and most importantly failure to take her a.m. medications which she always does. She was noted to be febrile and was incontinent. I am unable to get a history from the patient as she is currently on BiPAP.  PAST MEDICAL HISTORY :   has a past medical history of Hypertension and Hyperlipidemia.  has no past surgical history on file. Prior to Admission medications   Medication Sig Start Date End Date Taking? Authorizing Provider  docusate sodium 100 MG CAPS Take 100 mg by mouth 2 (two) times daily. 05/16/13   Bonnielee Haff, MD  febuxostat (ULORIC) 40 MG tablet Take 80 mg by mouth daily.    Historical Provider, MD  feeding supplement (RESOURCE BREEZE) LIQD Take 1 Container by mouth 3 (three) times daily between meals. 05/16/13   Bonnielee Haff, MD  ferrous gluconate (FERGON) 324 MG tablet Take 324 mg by mouth 2 (two) times daily with a meal.    Historical Provider,  MD  fluconazole (DIFLUCAN) 200 MG tablet Take 1 tablet (200 mg total) by mouth daily. Patient not taking: Reported on 04/24/2015 05/16/13   Bonnielee Haff, MD  fluticasone [facility]) 50 MCG/ACT nasal spray Place into the nose. 04/13/14 04/13/15  Historical Provider, MD  furosemide (LASIX) 20 MG tablet Take 20 mg by mouth daily. 02/13/15   Historical Provider, MD  levofloxacin (LEVAQUIN) 500 MG tablet Take 1 tablet (500 mg total) by mouth daily. Patient not taking: Reported on 03/20/2015 05/16/13   Bonnielee Haff, MD  metoprolol tartrate (LOPRESSOR) 25 MG tablet Take 25 mg by mouth 2 (two) times daily.    Historical Provider, MD  Multiple Vitamin (MULTIVITAMIN) tablet Take 1 tablet by mouth daily.    Historical Provider, MD  Multiple Vitamins-Minerals (EYE VITAMINS & MINERALS) TABS Take 1 capsule by mouth 2 (two) times daily.    Historical Provider, MD  nystatin (MYCOSTATIN) 100000 UNIT/ML suspension Take 5 mLs (500,000 Units total) by mouth 4 (four) times daily. Patient not taking: Reported on 04/24/2015 05/16/13   Bonnielee Haff, MD  oxybutynin (DITROPAN) 5 MG tablet Take 5 mg by mouth daily. 04/17/15   Historical Provider, MD  oxybutynin (DITROPAN-XL) 5 MG 24 hr tablet Take 5 mg by mouth at bedtime.    Historical Provider, MD  pantoprazole (PROTONIX) 40 MG tablet Take 40 mg by mouth daily before breakfast.    Historical Provider, MD  Polyethyl Glycol-Propyl Glycol 0.4-0.3 % SOLN Apply 1 drop to eye daily.    Historical Provider, MD  polyethylene glycol (MIRALAX / GLYCOLAX) packet Take 17 g  by mouth daily as needed for moderate constipation.    Historical Provider, MD  potassium chloride SA (K-DUR,KLOR-CON) 20 MEQ tablet TAKE 1 TABLET BY MOUTH EVERY DAY 03/29/15   Evlyn Kanner, NP  triamcinolone cream (KENALOG) 0.1 %  03/06/15   Historical Provider, MD   Allergies  Allergen Reactions  . Aspirin Rash    FAMILY HISTORY:  has no family status information on file.  SOCIAL HISTORY:  reports that she has never  smoked. She does not have any smokeless tobacco history on file. She reports that she does not drink alcohol or use illicit drugs.  REVIEW OF SYSTEMS:  Unable to obtain as the patient cannot speak through her BiPAP  SUBJECTIVE:   VITAL SIGNS: Temp:  [101.1 F (38.4 C)-103.8 F (39.9 C)] 101.3 F (38.5 C) (08/14 2000) Pulse Rate:  [127-145] 127 (08/14 2000) Resp:  [24-48] 27 (08/14 2000) BP: (136-200)/(62-102) 136/62 mmHg (08/14 2000) SpO2:  [95 %-100 %] 100 % (08/14 2000) FiO2 (%):  [50 %] 50 % (08/14 1930) Weight:  [127 lb 10.3 oz (57.9 kg)-135 lb 12.8 oz (61.598 kg)] 127 lb 10.3 oz (57.9 kg) (08/14 1930) HEMODYNAMICS:   VENTILATOR SETTINGS: Vent Mode:  [-] PCV;BIPAP FiO2 (%):  [50 %] 50 % Set Rate:  [15 bmp] 15 bmp PEEP:  [6 cmH20] 6 cmH20 INTAKE / OUTPUT: No intake or output data in the 24 hours ending 05/27/15 2029  PHYSICAL EXAMINATION: General:  74 year old African-American female who appears her stated age, appears to be slightly distressed and tachypneic. She is alert awake oriented 4 appears to be well-nourished and well-developed Neuro:  Cranial nerves II-XII grossly intact. PERRL. EOMI. Sensation and strength intact. Gait was not assessed due to acute illness and because she is on BiPAP.  HEENT: Head, normocephalic, atraumatic. Ears symmetric, permeable. Eyes: no conjunctival icterus, no erythema. Nose: permeable, midline septum  Cardiovascular: S1S2 she is tachycardic no murmurs, rubs, or gallops auscultated. No thrills palpated. She has positive JVD Lungs:  Chest symmetrical with respirations, equal expansion, she has diffuse crackles noted in both lungs Abdomen:  Soft, nontender, no guarding, nondistended. Present bowel sounds. Musculoskeletal:  No muscle atrophy noted nor weakness. ROM intact. Gait was not assessed due to critical illness. Patient has a deformity in her right wrist  Skin:  No notable scars, rashes, cruises. No bed sores noted.  Lymphatics: no  palpable lymphadenopathy of cervical, supraclvicular nor inguinal areas.     LABS:  CBC  Recent Labs Lab 05/27/15 1653  WBC 38.8*  HGB 8.8*  HCT 28.4*  PLT 20*   Coag's  Recent Labs Lab 05/27/15 1616  INR 1.50   BMET  Recent Labs Lab 05/27/15 1616  NA 142  K 4.1  CL 110  CO2 20*  BUN 75*  CREATININE 1.75*  GLUCOSE 121*   Electrolytes  Recent Labs Lab 05/27/15 1616  CALCIUM 9.0   Sepsis Markers  Recent Labs Lab 05/27/15 1616  LATICACIDVEN 3.3*   ABG No results for input(s): PHART, PCO2ART, PO2ART in the last 168 hours. Liver Enzymes  Recent Labs Lab 05/27/15 1616  AST 36  ALT 18  ALKPHOS 59  BILITOT 1.9*  ALBUMIN 2.7*   Cardiac Enzymes  Recent Labs Lab 05/27/15 1616  TROPONINI 0.03   Glucose  Recent Labs Lab 05/27/15 1923  GLUCAP 114*    Imaging Dg Chest Port 1 View  05/27/2015   CLINICAL DATA:  74 year old female with fever, hypoxia, tachycardia. Initial encounter.  EXAM:  PORTABLE CHEST - 1 VIEW  COMPARISON:  06/28/2013.  FINDINGS: Portable AP upright view at 1621 hours. Lower lung volumes. Crowding of lung markings. Stable cardiomegaly and mediastinal contours. No pneumothorax, pulmonary edema, pleural effusion or consolidation.  IMPRESSION: Low lung volumes with atelectasis.  Stable cardiomegaly.   Electronically Signed   By: Genevie Ann M.D.   On: 05/27/2015 16:44     ASSESSMENT / PLAN:  PULMONARY OETT, patient currently on BiPAP A: Acute hypoxemic respiratory failure likely secondary to volume overload P:  We'll provide Lasix 20 mg IV 1 and attempt to wean the BiPAP. We will re-dose per urine output  CARDIOVASCULAR CVL no need for central line this time A: Hypertension and hyperlipidemia Acute on Chronic diastolic heart failure noted from echocardiogram of 05/12/2013, precipitated likely by IV fluids  -EKG was performed in the ICU and the patient is in sinus tachycardia leftward axis, rate of about 120 narrow QRS  complexes, appears to have some LVH, no ST abnormalities no frank T-wave inversions. - Chest x-ray from outside hospital was reviewed with increased interstitial markings bilaterally and diffusely compared to prior P: diuresis gently with some furosemide. recheck 2-D echo. Check troponins 3. Check BNP now. Patient takes furosemide at home. Hopefully, we will be able to restart her metoprolol in the a.m. as she did not take her medications today since she was feeling ill - PRN labetalol for systolic blood pressure greater than 160. Patient is on a chronic beta blocker at home.  RENAL A:  CK stage III, with no acute kidney injury at the current time. Baseline creatinine appears to be between 1.5-1.65.  Gout on Uloric at home - TTP is also in the differential but her renal function appears to be close to baseline, we will rule this out P:  Monitor renal function closely. Provide 20 mg of Lasix IV 1 now. I will also send off for a peripheral smear, fibrinogen, d-dimer Recheck BMP in the a.m.  GASTROINTESTINAL A:  GERD on Protonix at home P:  Nothing by mouth for now, will advance diet once off BiPAP  HEMATOLOGIC A:  Chronic Myelomonocytic leukemia, which is stable per Dr. 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 - taken care of by Georgeanne Nim, NP - She is receiving outpatient infusions of iron sucrose P: We'll monitor all these chronic conditions with no need for platelet transfusion at this time  Please see comments above regarding workup for possible TTP  INFECTIOUS A:  Sepsis secondary to Urinary tract infection with UA showing many bacteria two plus leukocytes, negative for nitrites, positive fever and tachycardia, leukocytosis likely secondary to underlying CML - Chest x-ray was reviewed and is unremarkable P:  She is status post vancomycin and Zosyn at  [facility] ED. I have changed her over to Rocephin. Procalcitonin ordered  BCx2 checked on 05/27/2015 UC checked on a 7026 Sputum nonapplicable Abx: Rocephin, start date 05/27/2015  ENDOCRINE A:  No active issues   P:    NEUROLOGIC A:  Toxic metabolic encephalopathy secondary to acute illness - Already improving per family at bedside P:  We'll continue to monitor as patient appears to be nonfocal  OTHER A: Right wrist deformity, new per family P: Will obtain x-rays of the right wrist  FAMILY  - Updates: Patient's daughter who is a CNA and patient's son, Dallas Breeding, were at bedside and were provided an update  - Inter-disciplinary  family meet or Palliative Care meeting due by:  day 7    TODAY'S SUMMARY: Patiently currently in our ICU and acute hypoxemic respiratory failure on BiPAP. She states she is more comfortable and clinically looks better per family. We will diuresis her as tolerated and hopefully wean off BiPAP. I have explained that the patient may need an endotracheal tube to the family.  Critical care time 35 minutes: Complex medical management includes volume management, BiPAP management, EKG analysis, discussions with patient and family    Pulmonary and [place] Pager: [phone]  05/27/2015, 8:29 PM

## 2015-05-27 NOTE — ED Notes (Signed)
Pt to ED via EMS due to sudden onset altered mental and fever at 1515 today. Pt is confused on arrival, BGL 122. Fever of 103. MD Quale at bedside, Code Sepsis called at this time

## 2015-05-27 NOTE — ED Notes (Signed)
MD Quale at bedside. 

## 2015-05-27 NOTE — ED Notes (Signed)
RT called to pick up VBG at this time

## 2015-05-27 NOTE — ED Provider Notes (Signed)
[facility] Emergency Department Provider Note   EM caveat: Patient is somewhat confused with regard to orientation, appears slightly delirious and is unable to answer beyond just very basic questions. No family present at bedside at the time of patient arrival. ____________________________________________  Time seen: Approximately 4:15 PM  I have reviewed the triage vital signs and the nursing notes.   HISTORY  Chief Complaint Altered Mental Status and Fever    HPI [name] is a 74 y.o. female who has a history of CML and ITP per previous notes. She presents today after family called 59 for altered mental status. EMS reports the patient was noted to be febrile to 103, and that she has been having incontinence per report of family at the home.  The patient's blood sugar was 200. The patient is unabletogivehistory but per EMS after receiving the fluid bolus of 500 mL her mental status is improving and she is now able to speak and interact which she was very lethargic and non-verbal on their arrival.  EMS reports the patient did not have sudden onset of any strokelike symptoms. Patient is under the care of oncology.   Past Medical History  Diagnosis Date  . Hypertension   . Hyperlipidemia     Patient Active Problem List   Diagnosis Date Noted  . Allergic rhinitis 03/20/2015  . Gout 03/20/2015  . Absence of bladder continence 03/20/2015  . Acid reflux 05/23/2014  . Cervical osteoarthritis 01/26/2014  . Pain in shoulder 01/26/2014  . CGL (chronic granulocytic leukemia) 09/29/2013  . 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 Anemia in chronic illness 08/01/2013  . Breathlessness on exertion 08/01/2013  . 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 Abnormal respiratory rate 08/01/2013  . Absolute anemia 07/20/2013  . Chronic kidney disease (CKD), stage III (moderate) 07/20/2013  . Post menopausal syndrome 07/20/2013  . 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 Dysphagia, unspecified(787.20) 05/11/2013  . Oral thrush 05/11/2013  . Microcytic anemia 05/11/2013  . Protein-calorie malnutrition, severe 05/11/2013  . HTN (hypertension) 05/10/2013  . HCAP (healthcare-associated pneumonia) 05/10/2013  . Sepsis 05/10/2013  . Loss of weight 05/10/2013  . Febrile 04/27/2013    No past surgical history on file.  Current Outpatient Rx  Name  Route  Sig  Dispense  Refill  . docusate sodium 100 MG CAPS   Oral   Take 100 mg by mouth 2 (two) times daily.   60 capsule   0   . febuxostat (ULORIC) 40 MG tablet   Oral   Take 80 mg by mouth daily.         . feeding supplement (RESOURCE BREEZE) LIQD   Oral   Take 1 Container by mouth 3 (three) times daily between meals.   90 Container   0   . ferrous gluconate (FERGON) 324 MG tablet   Oral   Take 324 mg by mouth 2 (two) times daily with a meal.         . fluconazole (DIFLUCAN) 200 MG tablet   Oral   Take 1 tablet (200 mg total) by mouth daily. Patient not taking: Reported on 04/24/2015   14 tablet   0   . EXPIRED: fluticasone (FLONASE) 50 MCG/ACT nasal spray   Nasal   Place into the nose.         . furosemide (LASIX) 20 MG tablet   Oral   Take 20 mg by mouth daily.      11   . levofloxacin (LEVAQUIN)  500 MG tablet   Oral   Take 1 tablet (500 mg total) by mouth daily. Patient not taking: Reported on 03/20/2015   5 tablet   0   . metoprolol tartrate (LOPRESSOR) 25 MG tablet   Oral   Take 25 mg by mouth 2 (two) times daily.         . Multiple Vitamin (MULTIVITAMIN) tablet   Oral   Take 1 tablet by mouth daily.         . Multiple Vitamins-Minerals (EYE VITAMINS & MINERALS) TABS   Oral   Take 1 capsule by mouth 2 (two) times daily.         Marland Kitchen nystatin (MYCOSTATIN) 100000 UNIT/ML suspension   Oral   Take 5 mLs (500,000 Units total) by mouth 4 (four) times daily. Patient not taking: Reported on 04/24/2015   120 mL   0   . oxybutynin (DITROPAN)  5 MG tablet   Oral   Take 5 mg by mouth daily.      5   . oxybutynin (DITROPAN-XL) 5 MG 24 hr tablet   Oral   Take 5 mg by mouth at bedtime.         . pantoprazole (PROTONIX) 40 MG tablet   Oral   Take 40 mg by mouth daily before breakfast.         . Polyethyl Glycol-Propyl Glycol 0.4-0.3 % SOLN   Ophthalmic   Apply 1 drop to eye daily.         . polyethylene glycol (MIRALAX / GLYCOLAX) packet   Oral   Take 17 g by mouth daily as needed for moderate constipation.         . potassium chloride SA (K-DUR,KLOR-CON) 20 MEQ tablet      TAKE 1 TABLET BY MOUTH EVERY DAY   30 tablet   0   . triamcinolone cream (KENALOG) 0.1 %            2     Allergies Aspirin  No family history on file.  Social History Social History  Substance Use Topics  . Smoking status: Never Smoker   . Smokeless tobacco: Not on file  . Alcohol Use: No    Review of Systems {EM caveat: The patient is able to tell me that she is not in pain. 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 She is not able to give me any history as to what led up to today's events.  ____________________________________________   PHYSICAL EXAM:  VITAL SIGNS:                                                          --    EM caveat  Constitutional: Alert and disoriented except to her own name.  Eyes: Conjunctivae are normal. PERRL.  Head: Atraumatic. Nose: No congestion/rhinnorhea. Mouth/Throat: Mucous membranes are extremely dry.  Oropharynx non-erythematous but extremely dry. Neck: No stridor.   Cardiovascular: Tachycardic rate, regular rhythm. Grossly normal heart sounds.  Good peripheral circulation. Respiratory: Patient has tachypnea. Mild use of accessory muscles Lungs CTAB except for diminished lung sounds in the bases. Gastrointestinal: Soft and nontender. No distention. No abdominal bruits. No CVA tenderness. Musculoskeletal: No lower extremity tenderness nor edema.  No joint  effusions. No rashes noted. Neurologic:  Patient speaks her  own name clearly, but is unable to engage in meaningful conversation. She is able to tell me that she does not have pain, she does respond with her name and there is no gross dysarthria. Cranial nerves appear normal grossly, she moves all extremities to commands but is unable to follow more than simple one to two-step commands. Skin:  Skin is very warm, dry and intact. No rash noted.  ____________________________________________   LABS (all labs ordered are listed, but only abnormal results are displayed)  Labs Reviewed  LACTIC ACID, PLASMA - Abnormal; Notable for the following:    Lactic Acid, Venous 3.3 (*)    All other components within normal limits  COMPREHENSIVE METABOLIC PANEL - Abnormal; Notable for the following:    CO2 20 (*)    Glucose, Bld 121 (*)    BUN 75 (*)    Creatinine, Ser 1.75 (*)    Albumin 2.7 (*)    Total Bilirubin 1.9 (*)    GFR calc non Af Amer 28 (*)    GFR calc Af Amer 32 (*)    All other components within normal limits  BLOOD GAS, VENOUS - Abnormal; Notable for the following:    pCO2, Ven 34 (*)    Acid-base deficit 2.4 (*)    All other components within normal limits  PROTIME-INR - Abnormal; Notable for the following:    Prothrombin Time 18.3 (*)    All other components within normal limits  URINALYSIS COMPLETEWITH MICROSCOPIC ([facility] ONLY) - Abnormal; Notable for the following:    Color, Urine YELLOW (*)    APPearance HAZY (*)    Hgb urine dipstick 2+ (*)    Protein, ur 30 (*)    Leukocytes, UA 2+ (*)    Bacteria, UA MANY (*)    Squamous Epithelial / LPF 0-5 (*)    All other components within normal limits  CBC WITH DIFFERENTIAL/PLATELET - Abnormal; Notable for the following:    WBC 38.8 (*)    RBC 3.63 (*)    Hemoglobin 8.8 (*)    HCT 28.4 (*)    MCV 78.4 (*)    MCH 24.4 (*)    MCHC 31.1 (*)    RDW 21.6 (*)    Platelets 20 (*)    Lymphocytes Relative 10 (*)    Monocytes Relative  16 (*)    All other components within normal limits  CULTURE, BLOOD (ROUTINE X 2)  CULTURE, BLOOD (ROUTINE X 2)  URINE CULTURE  TROPONIN I  LACTIC ACID, PLASMA  CBC WITH DIFFERENTIAL/PLATELET   ____________________________________________  EKG  Reviewed and interpreted by me Significant artifact and respiratory variation is noted which interferes somewhat Sinus tachycardia at a rate of approximately 150 Ventricular rate 150 Nonspecific T-wave abnormality, possibly due to artifact without obvious ST elevation QTc 540 QRS 82 PR 162  Interpreted as sinus tachycardia, nonspecific T-wave abnormalities without obvious ischemic changes though artifact does limit interpretation ____________________________________________  RADIOLOGY   CLINICAL DATA: 74 year old female with fever, hypoxia, tachycardia. Initial encounter.  EXAM: PORTABLE CHEST - 1 VIEW  COMPARISON: 06/28/2013.  FINDINGS: Portable AP upright view at 1621 hours. Lower lung volumes. Crowding of lung markings. Stable cardiomegaly and mediastinal contours. No pneumothorax, pulmonary edema, pleural effusion or consolidation.  IMPRESSION: Low lung volumes with atelectasis. Stable cardiomegaly.    ____________________________________________   PROCEDURES  Procedure(s) performed: None  Critical Care performed: Yes, see critical care note(s)  CRITICAL CARE Performed by: Delman Kitten   Total critical care time: 82  Critical care time was exclusive of separately billable procedures and treating other patients.  Critical care was necessary to treat or prevent imminent or life-threatening deterioration.  Critical care was time spent personally by me on the following activities: development of treatment plan with patient and/or surrogate as well as nursing, discussions with consultants, evaluation of patient's response to treatment, examination of patient, obtaining history from patient or surrogate,  ordering and performing treatments and interventions, ordering and review of laboratory studies, ordering and review of radiographic studies, pulse oximetry and re-evaluation of patient's condition.   ____________________________________________   INITIAL IMPRESSION / ASSESSMENT AND PLAN / ED COURSE  Pertinent labs & imaging results that were available during my care of the patient were reviewed by me and considered in my medical decision making (see chart for details).  Patient with ITP and CML presents with fever to 103 and altered mental status which is improving with fluids. She looks extremely hypovolemic by exam. She is quite tachycardic as well. Primary concern be sepsis, but other etiologies are also considered including TTP, though this seems less likely in a patient with chronic ITP and sepsis seems much more likely.  Other etiologies considered would be acute cardiac, toxic metabolic, severely dehydration, consultation from CML. The patient has no focal neurologic findings, I doubt this represents acute stroke especially given the patient's high fever and rapid improvement with fluids. EKG indicates sinus tachycardia but no obvious acute ischemic change, we will send a full sepsis panel, obtain x-rays, urinalysis, and initiate early antibiotic's and early goal directed therapy.  ----------------------------------------- 5:14 PM on 05/27/2015 -----------------------------------------  Labs returned, indicative of urinary tract infection with sepsis. They currently have no intensive care unit or stepdown beds at [facility], I will refer the patient to [facility] for admission. Antibiotic coverage initiated, CBC is still pending at this time due to possible sampling error per the lab. Family updated, patient's mental status is improving she is now fully alert. Vital signs are slowly improving, we'll give anti-pyretic.  Patient's family is agreeable to transfer as we no longer have  any stepdown or ICU level care. Based on the patient's ongoing tachycardia and elevated lactate, to discuss the hospitalist at this point I do not feel and we do not feel that she is appropriate for floor level care.  On reevaluation, the patient is awake and alert in her mental status is improving. She reports she is resting comfortably, though she does appear quite. She does not have any JVD. She appears to be improving, but still appears hypovolemic with fluids ongoing. Lungs are clear except for diminished in the bases. Remains tachycardic.  ----------------------------------------- 5:54 PM on 05/27/2015 -----------------------------------------  Patient's oxygen requirement has increased to 4 L. She is notably very tachypnea can now has rales in the bases bilaterally. Suspect that fluid resuscitation under our sepsis protocol has lace the patient in CHF. She now has JVD. We will initiate BiPAP as she is awake and alert, but has increased work of breathing and rails and is now very hypertensive. I will plan to observe her blood pressures thereafter, and if they are stable and did not become hypotensive we may need to do light diuresis. Currently waiting for ICU physician at [facility] to contact regarding transfer, [facility] has already been called.  555p: Accepted to [facility] ICU (Dr. Tamala Julian) Agrees with BiPap at this time and ongoing care. Have stopped IV fluids at this time.  ----------------------------------------- 6:29 PM on 05/27/2015 -----------------------------------------  Reval:  Patient on BiPAP, presently improving. Heart rate 1:30, taking volumes about 14 L/m alert and tolerating BiPAP well. Blood pressure 121 now systolic, overall the patient reports improvement. Continue to hold further fluids and continue to monitor her closely. Accepted in transfer to [place] sending a units to perform transfer via critical care team.  After much delay, CBC has now returned and  shows severe leukocytosis which is higher than previous in setting of patient's CML indicative of infection, and also seeing hemoglobin of 9. ____________________________________________   FINAL CLINICAL IMPRESSION(S) / ED DIAGNOSES  Final diagnoses:  Sepsis, due to unspecified organism  Acute urinary tract infection  Acute pulmonary edema      Delman Kitten, MD 05/27/15 [phone]

## 2015-05-27 NOTE — ED Notes (Addendum)
Pt began to have more labored breathing, JVD, decrease in saturation and crackles in bilateral lobes. MD Quale made aware, fluids stopped at this time and RT called to place pt on Bipap.

## 2015-05-28 ENCOUNTER — Inpatient Hospital Stay (HOSPITAL_COMMUNITY): Payer: Medicare Other

## 2015-05-28 DIAGNOSIS — M21931 Unspecified acquired deformity of right forearm: Secondary | ICD-10-CM | POA: Diagnosis present

## 2015-05-28 DIAGNOSIS — J9600 Acute respiratory failure, unspecified whether with hypoxia or hypercapnia: Secondary | ICD-10-CM

## 2015-05-28 LAB — CBC
HCT: 25.4 % — ABNORMAL LOW (ref 36.0–46.0)
HEMOGLOBIN: 8.1 g/dL — AB (ref 12.0–15.0)
MCH: 24.9 pg — ABNORMAL LOW (ref 26.0–34.0)
MCHC: 31.9 g/dL (ref 30.0–36.0)
MCV: 78.2 fL (ref 78.0–100.0)
Platelets: 21 10*3/uL — CL (ref 150–400)
RBC: 3.25 MIL/uL — ABNORMAL LOW (ref 3.87–5.11)
RDW: 21.5 % — ABNORMAL HIGH (ref 11.5–15.5)
WBC: 36.2 10*3/uL — AB (ref 4.0–10.5)

## 2015-05-28 LAB — BASIC METABOLIC PANEL
ANION GAP: 6 (ref 5–15)
BUN: 65 mg/dL — ABNORMAL HIGH (ref 6–20)
CALCIUM: 8.7 mg/dL — AB (ref 8.9–10.3)
CO2: 24 mmol/L (ref 22–32)
Chloride: 110 mmol/L (ref 101–111)
Creatinine, Ser: 1.93 mg/dL — ABNORMAL HIGH (ref 0.44–1.00)
GFR, EST AFRICAN AMERICAN: 29 mL/min — AB (ref >60)
GFR, EST NON AFRICAN AMERICAN: 25 mL/min — AB (ref >60)
GLUCOSE: 96 mg/dL (ref 65–99)
POTASSIUM: 3.9 mmol/L (ref 3.5–5.1)
SODIUM: 140 mmol/L (ref 135–145)

## 2015-05-28 LAB — BRAIN NATRIURETIC PEPTIDE: B NATRIURETIC PEPTIDE 5: 515.4 pg/mL — AB (ref 0.0–100.0)

## 2015-05-28 LAB — LACTATE DEHYDROGENASE: LDH: 127 U/L (ref 98–192)

## 2015-05-28 LAB — GLUCOSE, CAPILLARY
GLUCOSE-CAPILLARY: 95 mg/dL (ref 65–99)
Glucose-Capillary: 92 mg/dL (ref 65–99)

## 2015-05-28 LAB — PHOSPHORUS: PHOSPHORUS: 2.2 mg/dL — AB (ref 2.5–4.6)

## 2015-05-28 LAB — PROCALCITONIN: PROCALCITONIN: 13.15 ng/mL

## 2015-05-28 LAB — TSH: TSH: 2.15 u[IU]/mL (ref 0.350–4.500)

## 2015-05-28 LAB — D-DIMER, QUANTITATIVE: D-Dimer, Quant: 3.66 ug/mL-FEU — ABNORMAL HIGH (ref 0.00–0.48)

## 2015-05-28 LAB — TROPONIN I
TROPONIN I: 0.03 ng/mL (ref <0.031)
TROPONIN I: 0.03 ng/mL (ref <0.031)
Troponin I: 0.04 ng/mL — ABNORMAL HIGH (ref <0.031)

## 2015-05-28 LAB — MAGNESIUM: MAGNESIUM: 1.8 mg/dL (ref 1.7–2.4)

## 2015-05-28 LAB — FIBRINOGEN: FIBRINOGEN: 342 mg/dL (ref 204–475)

## 2015-05-28 LAB — SAVE SMEAR

## 2015-05-28 LAB — TECHNOLOGIST SMEAR REVIEW: Tech Review: NONE SEEN

## 2015-05-28 LAB — LACTIC ACID, PLASMA: LACTIC ACID, VENOUS: 1.4 mmol/L (ref 0.5–2.0)

## 2015-05-28 MED ORDER — METOPROLOL TARTRATE 25 MG PO TABS
25.0000 mg | ORAL_TABLET | Freq: Two times a day (BID) | ORAL | Status: DC
  Administered 2015-05-28 – 2015-06-07 (×20): 25 mg via ORAL
  Filled 2015-05-28 (×22): qty 1

## 2015-05-28 MED ORDER — PANTOPRAZOLE SODIUM 40 MG PO TBEC
40.0000 mg | DELAYED_RELEASE_TABLET | Freq: Two times a day (BID) | ORAL | Status: DC
  Administered 2015-05-28 – 2015-06-07 (×19): 40 mg via ORAL
  Filled 2015-05-28 (×20): qty 1

## 2015-05-28 MED ORDER — WHITE PETROLATUM GEL
Status: AC
  Administered 2015-05-28: 0.2
  Filled 2015-05-28: qty 1

## 2015-05-28 MED ORDER — FUROSEMIDE 20 MG PO TABS
20.0000 mg | ORAL_TABLET | Freq: Every day | ORAL | Status: DC
  Administered 2015-05-28 – 2015-05-31 (×4): 20 mg via ORAL
  Filled 2015-05-28 (×6): qty 1

## 2015-05-28 NOTE — Progress Notes (Signed)
RN noted pts bed saturated with urine at time of transfer to [place]. Pt cleaned, gown changed and placed on 6E bed. Successful void post foley removal at 12 noon.

## 2015-05-28 NOTE — Progress Notes (Signed)
[facility] Physician-Brief Progress Note Patient Name: EDDIE KOC DOB: 06-05-1941 MRN: [id number]   Date of Service  05/28/2015  HPI/Events of Note  Patient c/o bilateral shoulder pain. Daughter requests bilateral shoulder x-rays.   eICU Interventions  Will order bilateral shoulder x-rays.      Intervention Category Minor Interventions: Clinical assessment - ordering diagnostic tests  Lysle Dingwall 05/28/2015, 7:33 PM

## 2015-05-28 NOTE — Progress Notes (Signed)
PULMONARY / CRITICAL CARE MEDICINE   Name: [name] MRN: [id number] DOB: 03-13-1941    ADMISSION DATE:  05/27/2015 CONSULTATION DATE:  05/27/2015  REFERRING MD :  [facility] ED  CHIEF COMPLAINT:  Confusion and fever  INITIAL PRESENTATION: 74yo AA female with PMH of CML, ITP, anemia, HTN, HLD, HFpEF, taken to [facility] ED 8/14 for AMS by her son. Son first she was not in her normal state of mentation on morning of 8/14, when he noticed confusion, inattentiveness, drowsiness. She was febrile and incontinent and placed on BiPAP in the ED. She was transferred to [facility] and PCCM was consulted.   STUDIES:  8/14  CXR with prominent interstitial markings, EKG was reviewed as listed below, x-ray of her right wrist was ordered  SIGNIFICANT EVENTS: 8/14  Taken by her son to [facility] emergency department for altered mental status, transferred to [facility], PCCM consulted  SUBJECTIVE: improved neuro, off BIPAP since last night  VITAL SIGNS: Temp:  [99.3 F (37.4 C)-103.8 F (39.9 C)] 99.3 F (37.4 C) (08/15 0800) Pulse Rate:  [90-145] 90 (08/15 0800) Resp:  [16-48] 16 (08/15 0800) BP: (130-200)/(61-102) 134/77 mmHg (08/15 0800) SpO2:  [95 %-100 %] 100 % (08/15 0800) FiO2 (%):  [40 %-50 %] 40 % (08/14 2200) Weight:  [56.1 kg (123 lb 10.9 oz)-61.598 kg (135 lb 12.8 oz)] 56.1 kg (123 lb 10.9 oz) (08/15 0500) HEMODYNAMICS:   VENTILATOR SETTINGS: Vent Mode:  [-] PCV;BIPAP FiO2 (%):  [40 %-50 %] 40 % Set Rate:  [15 bmp] 15 bmp PEEP:  [6 cmH20] 6 cmH20 INTAKE / OUTPUT:  Intake/Output Summary (Last 24 hours) at 05/28/15 1023 Last data filed at 05/28/15 0800  Gross per 24 hour  Intake 170.17 ml  Output    950 ml  Net -779.83 ml    PHYSICAL EXAMINATION: General:   Neuro:  Awake, alert, O x 2, no focal weakness HEENT: NCAT. PERRL Cardiovascular: S1S2  rrr int tachy low Lungs:  Reduced, crackles Abdomen:  Soft, nontender, no guarding, nondistended. Present bowel sounds. Musculoskeletal:  No  muscle atrophy noted nor weakness. ROM intact.Patient has a deformity in her right wrist  Skin:  No notable scars, rashes, cruises. No bed sores noted.   LABS:  CBC  Recent Labs Lab 05/27/15 1653 05/28/15 0707  WBC 38.8* 36.2*  HGB 8.8* 8.1*  HCT 28.4* 25.4*  PLT 20* 21*   Coag's  Recent Labs Lab 05/27/15 1616  INR 1.50   BMET  Recent Labs Lab 05/27/15 1616 05/28/15 0707  NA 142 140  K 4.1 3.9  CL 110 110  CO2 20* 24  BUN 75* 65*  CREATININE 1.75* 1.93*  GLUCOSE 121* 96   Electrolytes  Recent Labs Lab 05/27/15 1616 05/28/15 0707  CALCIUM 9.0 8.7*  MG  --  1.8  PHOS  --  2.2*   Sepsis Markers  Recent Labs Lab 05/27/15 1616 05/28/15 0226  LATICACIDVEN 3.3*  --   PROCALCITON  --  13.15   ABG No results for input(s): PHART, PCO2ART, PO2ART in the last 168 hours. Liver Enzymes  Recent Labs Lab 05/27/15 1616  AST 36  ALT 18  ALKPHOS 59  BILITOT 1.9*  ALBUMIN 2.7*   Cardiac Enzymes  Recent Labs Lab 05/27/15 1616 05/28/15 0226 05/28/15 0707  TROPONINI 0.03 0.04* 0.03   Glucose  Recent Labs Lab 05/27/15 1923 05/28/15 0835  GLUCAP 114* 95    Imaging Dg Wrist Complete Right  05/28/2015   CLINICAL DATA:  Posterior  wrist swelling. CML  EXAM: RIGHT WRIST - COMPLETE 3+ VIEW  COMPARISON:  None.  FINDINGS: There is a large erosion with overhanging edges involving the lateral distal radius with irregularity of the medial ulnar head. An ill-defined lucency in the distal ulnar metadiaphysis is likely related to the same process. There is soft tissue fullness in this region, but no calcified soft tissue mass.  Advanced first [facility] joint narrowing with subchondral cystic change and spurring.  Osteopenia.  No acute fracture dislocation.  Limited study due to patient condition, with no true lateral imaging.  IMPRESSION: 1. Erosions centered at the distal radial ulnar joint, likely related to patient's history of gout. 2. Limited portable study.    Electronically Signed   By: Monte Fantasia M.D.   On: 05/28/2015 01:33   Dg Chest Port 1 View  05/28/2015   CLINICAL DATA:  Acute respiratory failure  EXAM: PORTABLE CHEST - 1 VIEW  COMPARISON:  05/27/2015  FINDINGS: Chronic cardiomegaly.  Hypoventilation which limits evaluation. Lower lung opacities again seen, somewhat confluent in the retrocardiac lung. No edema, effusion, or pneumothorax.  IMPRESSION: Stable hypoventilation and basilar atelectasis. Cannot exclude retrocardiac pneumonia.   Electronically Signed   By: Monte Fantasia M.D.   On: 05/28/2015 01:35   Dg Chest Port 1 View  05/27/2015   CLINICAL DATA:  74 year old female with fever, hypoxia, tachycardia. Initial encounter.  EXAM: PORTABLE CHEST - 1 VIEW  COMPARISON:  06/28/2013.  FINDINGS: Portable AP upright view at 1621 hours. Lower lung volumes. Crowding of lung markings. Stable cardiomegaly and mediastinal contours. No pneumothorax, pulmonary edema, pleural effusion or consolidation.  IMPRESSION: Low lung volumes with atelectasis.  Stable cardiomegaly.   Electronically Signed   By: Genevie Ann M.D.   On: 05/27/2015 16:44     ASSESSMENT / PLAN:  PULMONARY A:  Acute hypoxemic respiratory failure >> likely secondary to volume overload ?Pulmonary edema  P:   Lasix  Improved resp status although pcxr unimpressive overall for edema  Repeat CXR intermittent Dc BIPAP abg wnl  CARDIOVASCULAR CVL  A:  Hypertension Tachycardia Hx HLD Acute on chronic diastolic heart failure component P: Lasix  Repeat Echo reasonable if failure came so easily and last echo 2014 with just grade 1 diast dz Restart home BP - metoprolol orals and follow BP trend  RENAL A:   Acute on chronic kidney disease (baseline 1.5, now at 1.93 and trending up) P:   Monitor renal function closely Recheck BMP in the a.m. euvolemic to up, lasix home oral and follow trend  GASTROINTESTINAL A:   GERD P:   PPI NPO, start diet  HEMATOLOGIC A:    Leukocytosis  CML - stable ITP -  prednisone responsive per notes Anemia of chronic disease/iron deficiency anemia secondary to renal failure  Sepsis causing worsening baseline leukocytosis / thrombocytopenia? (baseline secondary to CML) R/o dic P:  Monitor CBC in setting sepsis?  Continue workup for TTP - ensure per smear, ldh Gets outpatient iron infusions fibrinogen , d dimer noted Treating with abx  INFECTIOUS A:   Sepsis - likely secondary to UTI Elevated procalcitonin. Elevated lactate CXR non impressive P:   BCx2 8/14 >>>  Abx:  Vanc 8/14 >>8/14 Zosyn 8/14 >> 8/14 Rocephin 8/14 >>   She is status post vancomycin and Zosyn at [facility] ED. Switched to Rocephin on 8/15.  Procalcitonin algorithm Trend lactate this am and pct repeat Tylenol for fevers PRN Dc foley  ENDOCRINE A:   No active issues  P:   Check TSH  NEUROLOGIC A:   Toxic metabolic encephalopathy secondary to acute illness - improving P:  Monitor  OTHER A:  Right wrist deformity, new per family P:  Xray c/w gout, consider steroids if pain persists If declines, will need CT  FAMILY  - Updates: Patient's daughter who is a CNA and patient's son, Dallas Breeding, were at bedside and were provided an update  - Inter-disciplinary family meet or Palliative Care meeting due by:  day 7  To tele, triad  Lavon Paganini. Titus Mould, MD, [place] Pgr: [facility]

## 2015-05-28 NOTE — Consult Note (Signed)
ORTHOPAEDIC CONSULTATION HISTORY & PHYSICAL REQUESTING PHYSICIAN: Rush Farmer, MD  Chief Complaint: Right wrist swelling and pain  HPI: [name] is a 74 y.o. female with a long-standing history of gout and intermittent flares who reports painful enlargement on the dorsum of the right wrist. It is unclear through questioning just how long it has been present. It is thought to be more acute. Family is present, and cannot confirm this exactly. She has pain with rotation of the forearm. X-rays of been obtained, but not advanced imaging. She has been admitted for suspected UTI.  Past Medical History  Diagnosis Date  . Hypertension   . Hyperlipidemia    No past surgical history on file. Social History   Social History  . Marital Status: Married    Spouse Name: N/A  . Number of Children: N/A  . Years of Education: N/A   Social History Main Topics  . Smoking status: Never Smoker   . Smokeless tobacco: Not on file  . Alcohol Use: No  . Drug Use: No  . Sexual Activity: Not on file   Other Topics Concern  . Not on file   Social History Narrative   No family history on file. Allergies  Allergen Reactions  . Aspirin Rash   Prior to Admission medications   Medication Sig Start Date End Date Taking? Authorizing Provider  acetaminophen (TYLENOL) 650 MG CR tablet Take 650 mg by mouth every 8 (eight) hours as needed for pain.   Yes Historical Provider, MD  baclofen (LIORESAL) 10 MG tablet Take 5 mg by mouth at bedtime.   Yes Historical Provider, MD  cephALEXin (KEFLEX) 500 MG capsule Take 500 mg by mouth 2 (two) times daily. 05/22/15 05/29/15 Yes Historical Provider, MD  febuxostat (ULORIC) 40 MG tablet Take 40 mg by mouth daily.    Yes Historical Provider, MD  ferrous gluconate (FERGON) 324 MG tablet Take 324 mg by mouth 2 (two) times daily with a meal.   Yes Historical Provider, MD  fluticasone (FLONASE) 50 MCG/ACT nasal spray Place 2 sprays into both nostrils daily.  04/13/14  05/28/15 Yes Historical Provider, MD  furosemide (LASIX) 20 MG tablet Take 20 mg by mouth daily. 02/13/15  Yes Historical Provider, MD  metoprolol tartrate (LOPRESSOR) 25 MG tablet Take 25 mg by mouth 2 (two) times daily.   Yes Historical Provider, MD  oxybutynin (DITROPAN) 5 MG tablet Take 5 mg by mouth daily. 04/17/15  Yes Historical Provider, MD  pantoprazole (PROTONIX) 40 MG tablet Take 40 mg by mouth daily before breakfast.   Yes Historical Provider, MD  polyethylene glycol (MIRALAX / GLYCOLAX) packet Take 17 g by mouth daily as needed for moderate constipation.   Yes Historical Provider, MD  potassium chloride SA (K-DUR,KLOR-CON) 20 MEQ tablet TAKE 1 TABLET BY MOUTH EVERY DAY 03/29/15  Yes Evlyn Kanner, NP  predniSONE (DELTASONE) 20 MG tablet Take 20 mg by mouth 2 (two) times daily. 05/22/15 05/29/15 Yes Historical Provider, MD  sulfamethoxazole-trimethoprim (BACTRIM DS,SEPTRA DS) 800-160 MG per tablet Take 1 tablet by mouth 2 (two) times daily. 05/25/15 06/01/15 Yes Historical Provider, MD   Dg Wrist Complete Right  05/28/2015   CLINICAL DATA:  Posterior wrist swelling. CML  EXAM: RIGHT WRIST - COMPLETE 3+ VIEW  COMPARISON:  None.  FINDINGS: There is a large erosion with overhanging edges involving the lateral distal radius with irregularity of the medial ulnar head. An ill-defined lucency in the distal ulnar metadiaphysis is likely related to the  same process. There is soft tissue fullness in this region, but no calcified soft tissue mass.  Advanced first [facility] joint narrowing with subchondral cystic change and spurring.  Osteopenia.  No acute fracture dislocation.  Limited study due to patient condition, with no true lateral imaging.  IMPRESSION: 1. Erosions centered at the distal radial ulnar joint, likely related to patient's history of gout. 2. Limited portable study.   Electronically Signed   By: Monte Fantasia M.D.   On: 05/28/2015 01:33   Dg Chest Port 1 View  05/28/2015   CLINICAL DATA:  Acute  respiratory failure  EXAM: PORTABLE CHEST - 1 VIEW  COMPARISON:  05/27/2015  FINDINGS: Chronic cardiomegaly.  Hypoventilation which limits evaluation. Lower lung opacities again seen, somewhat confluent in the retrocardiac lung. No edema, effusion, or pneumothorax.  IMPRESSION: Stable hypoventilation and basilar atelectasis. Cannot exclude retrocardiac pneumonia.   Electronically Signed   By: Monte Fantasia M.D.   On: 05/28/2015 01:35   Dg Chest Port 1 View  05/27/2015   CLINICAL DATA:  74 year old female with fever, hypoxia, tachycardia. Initial encounter.  EXAM: PORTABLE CHEST - 1 VIEW  COMPARISON:  06/28/2013.  FINDINGS: Portable AP upright view at 1621 hours. Lower lung volumes. Crowding of lung markings. Stable cardiomegaly and mediastinal contours. No pneumothorax, pulmonary edema, pleural effusion or consolidation.  IMPRESSION: Low lung volumes with atelectasis.  Stable cardiomegaly.   Electronically Signed   By: Genevie Ann M.D.   On: 05/27/2015 16:44    Positive ROS: All other systems have been reviewed and were otherwise negative with the exception of those mentioned in the HPI and as above.  Physical Exam: Vitals: Refer to EMR. Constitutional:  WD, WN, NAD HEENT:  NCAT, EOMI Neuro/Psych:  Alert & oriented to person, place, and time; appropriate mood & affect Lymphatic: No generalized extremity edema or lymphadenopathy Extremities / MSK:  The extremities are normal with respect to appearance, ranges of motion, joint stability, muscle strength/tone, sensation, & perfusion except as otherwise noted:  There is enlargement on the dorsum of the right wrist centrally, which by palpation appears consistent with a ganglion cyst. The skin is intact and there appears to be no eruptive tophus. Forearm rotation is slightly restricted and provokes painful response, as does some wrist flexion and extension.  Assessment: Right wrist lesion of unclear etiology, differential diagnosis includes gouty erosion  with accompanying tophus and newly formed ganglion cyst versus other soft tissue mass  Recommendation: I discussed these findings with the patient's primary provider, and recommended advanced imaging with MRI to help better differentiate whether this is simply gouty erosion with an overlying ganglion cyst versus some other soft tissue tumor that has caused erosions in both the radius and ulna. If the patient is unable to undergo MRI scan, perhaps the radiologist could indicate if there is another imaging modality that could help provide helpful data.  Rayvon Char [address], [place], [place]  [address] Office: [phone] Mobile: [phone]

## 2015-05-28 NOTE — Progress Notes (Signed)
eLink Physician-Brief Progress Note Patient Name: [name] DOB: October 13, 1941 MRN: [id number]   Date of Service  05/28/2015  HPI/Events of Note  RN notified BNP, TropI, & D-dimer elevated in setting of sepsis.  eICU Interventions  Continue current tx. TTE pending.     Intervention Category Intermediate Interventions: Diagnostic test evaluation  Tera Partridge 05/28/2015, 6:01 AM

## 2015-05-28 NOTE — Progress Notes (Signed)
Patient arrived on unit from 2MW no family at bedside.  Telemetry placed per MD order.  CMT notified.

## 2015-05-29 ENCOUNTER — Inpatient Hospital Stay (HOSPITAL_COMMUNITY): Payer: Medicare Other

## 2015-05-29 DIAGNOSIS — N390 Urinary tract infection, site not specified: Secondary | ICD-10-CM

## 2015-05-29 DIAGNOSIS — N179 Acute kidney failure, unspecified: Secondary | ICD-10-CM

## 2015-05-29 DIAGNOSIS — N183 Chronic kidney disease, stage 3 (moderate): Secondary | ICD-10-CM

## 2015-05-29 DIAGNOSIS — I10 Essential (primary) hypertension: Secondary | ICD-10-CM

## 2015-05-29 DIAGNOSIS — A4151 Sepsis due to Escherichia coli [E. coli]: Principal | ICD-10-CM

## 2015-05-29 DIAGNOSIS — I509 Heart failure, unspecified: Secondary | ICD-10-CM

## 2015-05-29 DIAGNOSIS — K219 Gastro-esophageal reflux disease without esophagitis: Secondary | ICD-10-CM

## 2015-05-29 LAB — CBC WITH DIFFERENTIAL/PLATELET
BASOS ABS: 0 10*3/uL (ref 0.0–0.1)
BASOS PCT: 0 % (ref 0–1)
EOS ABS: 0 10*3/uL (ref 0.0–0.7)
Eosinophils Relative: 0 % (ref 0–5)
HEMATOCRIT: 26.3 % — AB (ref 36.0–46.0)
Hemoglobin: 8.5 g/dL — ABNORMAL LOW (ref 12.0–15.0)
LYMPHS PCT: 6 % — AB (ref 12–46)
Lymphs Abs: 2.1 10*3/uL (ref 0.7–4.0)
MCH: 25.1 pg — ABNORMAL LOW (ref 26.0–34.0)
MCHC: 32.3 g/dL (ref 30.0–36.0)
MCV: 77.8 fL — AB (ref 78.0–100.0)
Monocytes Absolute: 5.2 10*3/uL — ABNORMAL HIGH (ref 0.1–1.0)
Monocytes Relative: 15 % — ABNORMAL HIGH (ref 3–12)
NEUTROS ABS: 27.3 10*3/uL — AB (ref 1.7–7.7)
Neutrophils Relative %: 79 % — ABNORMAL HIGH (ref 43–77)
Platelets: 23 10*3/uL — CL (ref 150–400)
RBC: 3.38 MIL/uL — ABNORMAL LOW (ref 3.87–5.11)
RDW: 21.8 % — AB (ref 11.5–15.5)
WBC: 34.6 10*3/uL — ABNORMAL HIGH (ref 4.0–10.5)

## 2015-05-29 LAB — URINE CULTURE: Culture: >=100000

## 2015-05-29 LAB — COMPREHENSIVE METABOLIC PANEL
ALBUMIN: 2.5 g/dL — AB (ref 3.5–5.0)
ALK PHOS: 70 U/L (ref 38–126)
ALT: 21 U/L (ref 14–54)
ANION GAP: 11 (ref 5–15)
AST: 27 U/L (ref 15–41)
BUN: 65 mg/dL — ABNORMAL HIGH (ref 6–20)
CALCIUM: 8.7 mg/dL — AB (ref 8.9–10.3)
CO2: 22 mmol/L (ref 22–32)
Chloride: 111 mmol/L (ref 101–111)
Creatinine, Ser: 1.98 mg/dL — ABNORMAL HIGH (ref 0.44–1.00)
GFR calc Af Amer: 28 mL/min — ABNORMAL LOW (ref >60)
GFR calc non Af Amer: 24 mL/min — ABNORMAL LOW (ref >60)
GLUCOSE: 128 mg/dL — AB (ref 65–99)
Potassium: 3.4 mmol/L — ABNORMAL LOW (ref 3.5–5.1)
SODIUM: 144 mmol/L (ref 135–145)
Total Bilirubin: 1.2 mg/dL (ref 0.3–1.2)
Total Protein: 7.5 g/dL (ref 6.5–8.1)

## 2015-05-29 LAB — PATHOLOGIST SMEAR REVIEW

## 2015-05-29 NOTE — Evaluation (Signed)
Clinical/Bedside Swallow Evaluation Patient Details  Name: [name] MRN: [id number] Date of Birth: 11/07/1940  Today's Date: 05/29/2015 Time: SLP Start Time (ACUTE ONLY): 1515 SLP Stop Time (ACUTE ONLY): 1529 SLP Time Calculation (min) (ACUTE ONLY): 14 min  Past Medical History:  Past Medical History  Diagnosis Date  . Hypertension   . Hyperlipidemia    Past Surgical History: No past surgical history on file. 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 Pt initially needing BiPAP. Swallow evaluation ordered as pt was exhibiting oral holding, particularly with meds.   Assessment / Plan / Recommendation Clinical Impression  Pt's oropharyngeal phase is within gross functional limits, although with mildly prolonged mastication that is not unexpected given missing dentition. Will adjust diet to mechanical soft textures given elevated risk with altered mentation, and would continue to crush pills due to RN report of oral holding observed throughout the day. She may also benefit from a liquid wash to facilitate oral clearance of pills. Will f/u briefly to ensure tolerance.     Aspiration Risk  Mild    Diet Recommendation Dysphagia 3 (Mech soft);Thin   Medication Administration: Crushed with puree Compensations: Minimize environmental distractions;Slow rate;Small sips/bites;Follow solids with liquid    Other  Recommendations Oral Care Recommendations: Oral care BID   Follow Up Recommendations   tba    Frequency and Duration min 1 x/week  1 week   Pertinent Vitals/Pain n/a    SLP Swallow Goals     Swallow Study Prior Functional Status       General Other Pertinent Information: 74yo AA female with PMH of CML, ITP, anemia, HTN, HLD, HFpEF, taken to [facility] ED 8/14 for AMS by her son. Pt initially needing BiPAP. Swallow evaluation ordered as pt was exhibiting oral holding, particularly with meds. Type of Study:  Bedside swallow evaluation Previous Swallow Assessment: BSE 04/2013 recommending regular dit and thin liquids, some oral holding noted Diet Prior to this Study: Regular;Thin liquids Temperature Spikes Noted: No History of Recent Intubation: No Behavior/Cognition: Alert;Cooperative;Pleasant mood;Requires cueing Oral Cavity - Dentition: Missing dentition Self-Feeding Abilities: Able to feed self Patient Positioning: Upright in bed Baseline Vocal Quality: Normal    Oral/Motor/Sensory Function Overall Oral Motor/Sensory Function: Appears within functional limits for tasks assessed   Ice Chips Ice chips: Not tested   Thin Liquid Thin Liquid: Within functional limits Presentation: Self Fed;Straw    Nectar Thick Nectar Thick Liquid: Not tested   Honey Thick Honey Thick Liquid: Not tested   Puree Puree: Not tested   Solid    Solid: Within functional limits Presentation: Self Fed      Germain Osgood, M.A. CCC-SLP [phone]  Germain Osgood 05/29/2015,3:39 PM

## 2015-05-29 NOTE — Progress Notes (Signed)
05/29/15 0335 Patient c/o pain on both shoulders,tylenol 650 mg offered on apple sauce,patient spit it out. 0430 Patient again c/o pain on both shoulders,told patient she spit out medicine earlier.This time patient said she will take it. Tylenol 650 mg crushed in apple sauce but patient spit it out again. Jahshua Bonito, Wonda Cheng, Therapist, sports

## 2015-05-29 NOTE — Progress Notes (Signed)
Echocardiogram 2D Echocardiogram has been performed.  [name] 05/29/2015, 2:17 PM

## 2015-05-29 NOTE — Progress Notes (Addendum)
[facility] PROGRESS NOTE  [name] GBT:[id number] DOB: [birth year] DOA: 05/27/2015 PCP: Leotis Shames, MD  Assessment/Plan: 1-sepsis due to E. Coli UTI -patient better overall, but still with low grade temp -will continue rocephin -sensitivity demonstrating abx choice is appropriate -will continue supportive care and follow clinical response -PRN antipyretics   2-acute hypoxemic resp failure: with ?? Pulmonary edema -patient initially required BIPAP -now breathing easier and maintaining O2 sat on [place] -will continue lasix PO now and follow volume status -will continue flutter valve -continue PRN nebulizer and will start pulmicort  3-acute on chronic diastolic heart failure -neg troponin and no CP -will follow daily weights, strict intake and output and continue diuretics  4-acute on chronic renal failure: stage 3 at baseline with Cr at 1.5 -Cr currently 1.98 -will continue tx for UTI -on PO lasix now, home dose -will monitor Cr trend  5-toxic metabolic encephalopathy: due to UTI -improving and currently oriented X 2 -will monitor and continue constant re-orientation  6-right wrist deformity and pain: -consistently with gout as per x-ray -orthopedic service consulted -will follow rec's  7-lactic acid: improving -will recheck in am  8-HTN: stable -will continue metoprolol and lasix  9-GERD: will continue PPI  10-Leukocytosis: due to hx of CML and infection -slowly trending down -will monitor  11-thrombocytopenia: due to sepsis most likely -will monitor platelets trend -avoid heparin products -SCD's for DVT prophylaxis    Code Status: Full Family Communication: Son at bedside Disposition Plan: to be determine; but most likely will require SNF for rehab; family ok with it if needed   Consultants:  PCCM  Procedures:  2-D echo - Left ventricle: The cavity size was normal. Wall thickness was normal. Systolic function was normal. The  estimated ejection fraction was in the range of 60% to 65%. Wall motion was normal; there were no regional wall motion abnormalities. Doppler parameters are consistent with abnormal left ventricular relaxation (grade 1 diastolic dysfunction). - Aortic valve: There was no stenosis. - Mitral valve: There was trivial regurgitation. - Left atrium: The atrium was moderately dilated. - Right ventricle: The cavity size was normal. Systolic function was normal. - Tricuspid valve: Peak RV-RA gradient (S): 40 mm Hg. - Pulmonary arteries: PA peak pressure: 43 mm Hg (S). - Inferior vena cava: The vessel was normal in size. The respirophasic diameter changes were in the normal range (>= 50%), consistent with normal central venous pressure.  Impressions: - Normal LV size with EF 60-65%. Normal RV size and systolic function. Moderate LAE. No significant valvular abnormalities. Mild pulmonary hypertension.  Antibiotics: Vanc 8/14 >>8/14 Zosyn 8/14 >> 8/14 Rocephin 8/14 >>   HPI/Subjective: Low grade fever overnight, denies CP and dysuria, also endorses breathing is better. Still requiring O2 supplementation and with difficulty swallowing meds earlier.  Objective: Filed Vitals:   05/29/15 2056  BP: 161/87  Pulse: 85  Temp: 99.1 F (37.3 C)  Resp: 18    Intake/Output Summary (Last 24 hours) at 05/29/15 2252 Last data filed at 05/29/15 2200  Gross per 24 hour  Intake    290 ml  Output      2 ml  Net    288 ml   Filed Weights   05/27/15 1930 05/28/15 0500 05/28/15 2104  Weight: 57.9 kg (127 lb 10.3 oz) 56.1 kg (123 lb 10.9 oz) 60.782 kg (134 lb)    Exam:   General:  Low grade temp overnight, no CP and endorses breathing is stable. Patient was having difficulties swallowing  her pills earlier this morning. No nausea, no vomiting.  Cardiovascular: S1 and S2, no rubs or gallops  Respiratory: scattered rhonchi, no frank crackles; positive mild exp wheezing    Abdomen: soft, NT, ND, positive BS  Musculoskeletal: right hand with wrist deformity and pain to touch; no erythema seen  Data Reviewed: Basic Metabolic Panel:  Recent Labs Lab 05/27/15 1616 05/28/15 0707 05/29/15 0536  NA 142 140 144  K 4.1 3.9 3.4*  CL 110 110 111  CO2 20* 24 22  GLUCOSE 121* 96 128*  BUN 75* 65* 65*  CREATININE 1.75* 1.93* 1.98*  CALCIUM 9.0 8.7* 8.7*  MG  --  1.8  --   PHOS  --  2.2*  --    Liver Function Tests:  Recent Labs Lab 05/27/15 1616 05/29/15 0536  AST 36 27  ALT 18 21  ALKPHOS 59 70  BILITOT 1.9* 1.2  PROT 7.4 7.5  ALBUMIN 2.7* 2.5*   CBC:  Recent Labs Lab 05/27/15 1653 05/28/15 0707 05/29/15 0536  WBC 38.8* 36.2* 34.6*  NEUTROABS  --   --  27.3*  HGB 8.8* 8.1* 8.5*  HCT 28.4* 25.4* 26.3*  MCV 78.4* 78.2 77.8*  PLT 20* 21* 23*   Cardiac Enzymes:  Recent Labs Lab 05/27/15 1616 05/28/15 0226 05/28/15 0707 05/28/15 1515  TROPONINI 0.03 0.04* 0.03 0.03   BNP (last 3 results)  Recent Labs  05/28/15 0226  BNP 515.4*   CBG:  Recent Labs Lab 05/27/15 1923 05/28/15 0835 05/28/15 1137  GLUCAP 114* 95 92    Recent Results (from the past 240 hour(s))  Blood Culture (routine x 2)     Status: None (Preliminary result)   Collection Time: 05/27/15  4:16 PM  Result Value Ref Range Status   Specimen Description BLOOD RIGHT FOREARM  Final   Special Requests   Final    BOTTLES DRAWN AEROBIC AND ANAEROBIC  AER 5CC ANA 3CC   Culture  Setup Time   Final    GRAM NEGATIVE RODS IN BOTH AEROBIC AND ANAEROBIC BOTTLES CRITICAL RESULT CALLED TO, READ BACK BY AND VERIFIED WITH: Mali VAN DYKE AT 1610 05/28/15.PMH CONFIRMED BY [facility]    Culture   Final    GRAM NEGATIVE RODS IN BOTH AEROBIC AND ANAEROBIC BOTTLES IDENTIFICATION AND SUSCEPTIBILITIES TO FOLLOW    Report Status PENDING  Incomplete  Blood Culture (routine x 2)     Status: None (Preliminary result)   Collection Time: 05/27/15  4:16 PM  Result Value Ref Range  Status   Specimen Description BLOOD RIGHT HAND  Final   Special Requests BOTTLES DRAWN AEROBIC AND ANAEROBIC  1CC  Final   Culture  Setup Time   Final    GRAM NEGATIVE RODS IN BOTH AEROBIC AND ANAEROBIC BOTTLES CRITICAL RESULT CALLED TO, READ BACK BY AND VERIFIED WITH: Mali VAN DYKE AT 9604 05/28/15.PMH CONFIRMED BY [facility] CRITICAL VALUE NOTED.  VALUE IS CONSISTENT WITH PREVIOUSLY REPORTED AND CALLED VALUE.    Culture   Final    GRAM NEGATIVE RODS IN BOTH AEROBIC AND ANAEROBIC BOTTLES IDENTIFICATION AND SUSCEPTIBILITIES TO FOLLOW    Report Status PENDING  Incomplete  Urine culture     Status: None   Collection Time: 05/27/15  4:53 PM  Result Value Ref Range Status   Specimen Description URINE, RANDOM  Final   Special Requests NONE  Final   Culture >=100,000 COLONIES/mL ESCHERICHIA COLI  Final   Report Status 05/29/2015 FINAL  Final   Organism ID, Bacteria  ESCHERICHIA COLI  Final      Susceptibility   Escherichia coli - MIC*    AMPICILLIN <=2 SENSITIVE Sensitive     CEFTAZIDIME <=1 SENSITIVE Sensitive     CEFAZOLIN <=4 SENSITIVE Sensitive     CEFTRIAXONE <=1 SENSITIVE Sensitive     CIPROFLOXACIN <=0.25 SENSITIVE Sensitive     GENTAMICIN <=1 SENSITIVE Sensitive     IMIPENEM <=0.25 SENSITIVE Sensitive     TRIMETH/SULFA <=20 SENSITIVE Sensitive     NITROFURANTOIN Value in next row Sensitive      SENSITIVE<=16    PIP/TAZO Value in next row Sensitive      SENSITIVE<=4    * >=100,000 COLONIES/mL ESCHERICHIA COLI  MRSA PCR Screening     Status: None   Collection Time: 05/27/15  7:36 PM  Result Value Ref Range Status   MRSA by PCR NEGATIVE NEGATIVE Final    Comment:        The GeneXpert MRSA Assay (FDA approved for NASAL specimens only), is one component of a comprehensive MRSA colonization surveillance program. It is not intended to diagnose MRSA infection nor to guide or monitor treatment for MRSA infections.      Studies: Dg Shoulder Left Port  05/28/2015   CLINICAL  DATA:  Bilateral shoulder pain  EXAM: LEFT SHOULDER - 1 VIEW  COMPARISON:  None.  FINDINGS: No fracture or dislocation is seen.  Mild degenerative changes of the acromioclavicular joint.  The visualized soft tissues are unremarkable.  Visualized left lung is essentially clear.  IMPRESSION: No fracture or dislocation is seen.   Electronically Signed   By: Julian Hy M.D.   On: 05/28/2015 21:29   Dg Shoulder Right Port  05/28/2015   CLINICAL DATA:  Bilateral shoulder pain  EXAM: PORTABLE RIGHT SHOULDER - 2+ VIEW  COMPARISON:  None.  FINDINGS: No fracture or dislocation is seen.  Degenerative changes of the acromioclavicular joint.  Visualized soft tissues are within normal limits.  Visualized right lung is clear.  IMPRESSION: No fracture or dislocation is seen.   Electronically Signed   By: Julian Hy M.D.   On: 05/28/2015 21:29    Scheduled Meds: . antiseptic oral rinse  7 mL Mouth Rinse q12n4p  . cefTRIAXone (ROCEPHIN)  IV  1 g Intravenous Q24H  . chlorhexidine  15 mL Mouth Rinse BID  . furosemide  20 mg Oral Daily  . metoprolol tartrate  25 mg Oral BID  . pantoprazole  40 mg Oral BID AC   Continuous Infusions:   Active Problems:   Acute respiratory failure with hypoxemia   Acute respiratory failure   Right wrist deformity    Time spent: 35 minutes    Barton Dubois  [facility] Pager [phone]. If 7PM-7AM, please contact night-coverage at [email or web address], password [facility] 05/29/2015, 10:52 PM  LOS: 2 days

## 2015-05-30 ENCOUNTER — Other Ambulatory Visit: Payer: Self-pay | Admitting: Family Medicine

## 2015-05-30 ENCOUNTER — Inpatient Hospital Stay (HOSPITAL_COMMUNITY): Payer: Medicare Other

## 2015-05-30 LAB — CULTURE, BLOOD (ROUTINE X 2)

## 2015-05-30 LAB — BASIC METABOLIC PANEL
ANION GAP: 10 (ref 5–15)
BUN: 65 mg/dL — ABNORMAL HIGH (ref 6–20)
CO2: 23 mmol/L (ref 22–32)
Calcium: 8.4 mg/dL — ABNORMAL LOW (ref 8.9–10.3)
Chloride: 109 mmol/L (ref 101–111)
Creatinine, Ser: 1.69 mg/dL — ABNORMAL HIGH (ref 0.44–1.00)
GFR calc Af Amer: 34 mL/min — ABNORMAL LOW (ref >60)
GFR calc non Af Amer: 29 mL/min — ABNORMAL LOW (ref >60)
GLUCOSE: 124 mg/dL — AB (ref 65–99)
POTASSIUM: 3.2 mmol/L — AB (ref 3.5–5.1)
Sodium: 142 mmol/L (ref 135–145)

## 2015-05-30 LAB — CBC
HEMATOCRIT: 25.6 % — AB (ref 36.0–46.0)
HEMOGLOBIN: 8.2 g/dL — AB (ref 12.0–15.0)
MCH: 25.2 pg — ABNORMAL LOW (ref 26.0–34.0)
MCHC: 32 g/dL (ref 30.0–36.0)
MCV: 78.5 fL (ref 78.0–100.0)
Platelets: 27 10*3/uL — CL (ref 150–400)
RBC: 3.26 MIL/uL — AB (ref 3.87–5.11)
RDW: 21.4 % — ABNORMAL HIGH (ref 11.5–15.5)
WBC: 28 10*3/uL — AB (ref 4.0–10.5)

## 2015-05-30 LAB — LACTIC ACID, PLASMA: Lactic Acid, Venous: 1.6 mmol/L (ref 0.5–2.0)

## 2015-05-30 MED ORDER — IPRATROPIUM-ALBUTEROL 0.5-2.5 (3) MG/3ML IN SOLN
3.0000 mL | Freq: Four times a day (QID) | RESPIRATORY_TRACT | Status: DC
  Administered 2015-05-30: 3 mL via RESPIRATORY_TRACT
  Filled 2015-05-30: qty 3

## 2015-05-30 MED ORDER — FUROSEMIDE 20 MG PO TABS
20.0000 mg | ORAL_TABLET | Freq: Once | ORAL | Status: DC
Start: 2015-05-30 — End: 2015-05-31
  Filled 2015-05-30: qty 1

## 2015-05-30 MED ORDER — POTASSIUM CHLORIDE CRYS ER 20 MEQ PO TBCR
40.0000 meq | EXTENDED_RELEASE_TABLET | Freq: Once | ORAL | Status: AC
  Administered 2015-05-30: 40 meq via ORAL
  Filled 2015-05-30: qty 2

## 2015-05-30 MED ORDER — IPRATROPIUM-ALBUTEROL 0.5-2.5 (3) MG/3ML IN SOLN: 3.0000 mL | Freq: Four times a day (QID) | RESPIRATORY_TRACT | Status: DC | PRN

## 2015-05-30 NOTE — Progress Notes (Signed)
MRI scan of wrist reviewed.  I will plan to aspirate the fluid tomorrow for culture and crystal analysis, but I strongly favor gout as etiology.  Micheline Rough, MD Hand Surgery [phone]

## 2015-05-30 NOTE — Progress Notes (Signed)
Speech Language Pathology Treatment: Dysphagia  Patient Details Name: [name] MRN: [id number] DOB: 1940-11-02 Today's Date: 05/30/2015 Time: 9150-5697 SLP Time Calculation (min) (ACUTE ONLY): 11 min  Assessment / Plan / Recommendation Clinical Impression  RN reports that pt took meds well this morning, but exhibits oral holding this afternoon. SLP administered boluses of thin liquids as well as Dys 1 and 2 textures. Thin liquids are consumed without incidence, however with Dys 2 textures she has prolonged, over-mastication of foods, and orally holds the bolus until she ultimately expectorates it. She does not swallow the prepared bolus despite Max cues, but she did swallow purees with Mod prompting. Continue to suspect that this is cognitive in nature, particularly given fluctuation of symptoms. Recommend downgrading diet to pureed foods and thin liquids to account for this variability throughout the day and maximize pt safety with swallowing.   HPI Other Pertinent Information: 74yo AA female with PMH of CML, ITP, anemia, HTN, HLD, HFpEF, taken to [facility] ED 8/14 for AMS by her son. Pt initially needing BiPAP. Swallow evaluation ordered as pt was exhibiting oral holding, particularly with meds.   Pertinent Vitals Pain Assessment: No/denies pain  SLP Plan  Continue with current plan of care    Recommendations Diet recommendations: Dysphagia 1 (puree);Thin liquid Liquids provided via: Cup;Straw Medication Administration: Crushed with puree Supervision: Patient able to self feed;Full supervision/cueing for compensatory strategies Compensations: Minimize environmental distractions;Slow rate;Small sips/bites;Follow solids with liquid Postural Changes and/or Swallow Maneuvers: Seated upright 90 degrees;Upright 30-60 min after meal       Oral Care Recommendations: Oral care BID Follow up Recommendations: Home health SLP;24 hour supervision/assistance Plan: Continue with current plan of care     Germain Osgood, M.A. CCC-SLP [phone]  Germain Osgood 05/30/2015, 4:28 PM

## 2015-05-30 NOTE — Progress Notes (Signed)
SLP Cancellation Note  Patient Details Name: CIMBERLY STOFFEL MRN: [id number] DOB: 05/30/1941   Cancelled treatment:       Reason Eval/Treat Not Completed: Patient at procedure or test/unavailable   Germain Osgood, M.A. CCC-SLP [phone]  Germain Osgood 05/30/2015, 2:11 PM

## 2015-05-30 NOTE — Care Management Important Message (Signed)
Important Message  Patient Details  Name: [name] MRN: [id number] Date of Birth: 06/12/1941   Medicare Important Message Given:  Yes-second notification given    Pricilla Handler 05/30/2015, 12:20 PM

## 2015-05-30 NOTE — Progress Notes (Signed)
[facility] PROGRESS NOTE  [name] RJJ:[id number] DOB: 1941/10/05 DOA: 05/27/2015 PCP: Leotis Shames, MD  Assessment/Plan: 1-sepsis due to E. Coli UTI -Afebrile.  -will continue rocephin -sensitivity demonstrating abx choice is appropriate -will continue supportive care and follow clinical response -PRN antipyretics   2-acute hypoxemic resp failure: with ?? Pulmonary edema -patient initially required BIPAP -now breathing easier and maintaining O2 sat on [place] -will continue lasix PO now and follow volume status -will continue flutter valve -continue PRN nebulizer and will start pulmicort Weight going up. 56--60--61. Will give extra dose of lasix.   3-Acute on chronic diastolic heart failure -neg troponin and no CP -will follow daily weights, strict intake and output and continue diuretics  4-Acute on chronic renal failure: stage 3 at baseline with Cr at 1.5 -Cr currently 1.6 -will continue tx for UTI -on PO lasix now, home dose -will monitor Cr trend  5-Toxic metabolic encephalopathy: due to UTI -improving and currently oriented X 2 -will monitor and continue constant re-orientation  6-Right wrist deformity and pain: -consistently with gout as per x-ray -orthopedic service consulted -ortho recommending MRI. I have order MRI>   7-lactic acid: resolved.   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 Will contact patient primary oncology   Code Status: Full Family Communication: patient  Disposition Plan: to be determine; but most likely will require SNF for rehab; family ok with it if needed   Consultants:  PCCM  Procedures:  2-D echo - Left ventricle:  The cavity size was normal. Wall thickness was normal. Systolic function was normal. The estimated ejection fraction was in the range of 60% to 65%. Wall motion was normal; there were no regional wall motion abnormalities. Doppler parameters are consistent with abnormal left ventricular relaxation (grade 1 diastolic dysfunction). - Aortic valve: There was no stenosis. - Mitral valve: There was trivial regurgitation. - Left atrium: The atrium was moderately dilated. - Right ventricle: The cavity size was normal. Systolic function was normal. - Tricuspid valve: Peak RV-RA gradient (S): 40 mm Hg. - Pulmonary arteries: PA peak pressure: 43 mm Hg (S). - Inferior vena cava: The vessel was normal in size. The respirophasic diameter changes were in the normal range (>= 50%), consistent with normal central venous pressure.  Impressions: - Normal LV size with EF 60-65%. Normal RV size and systolic function. Moderate LAE. No significant valvular abnormalities. Mild pulmonary hypertension.  Antibiotics: Vanc 8/14 >>8/14 Zosyn 8/14 >> 8/14 Rocephin 8/14 >>   HPI/Subjective: Low grade fever overnight, denies CP and dysuria, also endorses breathing is better. Still requiring O2 supplementation and with difficulty swallowing meds earlier.  Objective: Filed Vitals:   05/30/15 1602  BP: 128/79  Pulse: 88  Temp: 98.8 F (37.1 C)  Resp: 18    Intake/Output Summary (Last 24 hours) at 05/30/15 1655 Last data filed at 05/30/15 1603  Gross per 24 hour  Intake   1010 ml  Output    100 ml  Net    910 ml   Filed Weights   05/28/15 0500 05/28/15 2104 05/30/15 0542  Weight: 56.1 kg (123 lb 10.9 oz) 60.782 kg (134 lb) 61 kg (134 lb 7.7 oz)    Exam:   General:  Low grade temp overnight,  no CP and endorses breathing is stable. Patient was having difficulties swallowing her pills earlier this morning. No nausea, no vomiting.  Cardiovascular: S1 and S2, no rubs or  gallops  Respiratory: scattered rhonchi, no frank crackles; positive mild exp wheezing   Abdomen: soft, NT, ND, positive BS  Musculoskeletal: right hand with wrist deformity and pain to touch; no erythema seen  Data Reviewed: Basic Metabolic Panel:  Recent Labs Lab 05/27/15 1616 05/28/15 0707 05/29/15 0536 05/30/15 0555  NA 142 140 144 142  K 4.1 3.9 3.4* 3.2*  CL 110 110 111 109  CO2 20* 24 22 23   GLUCOSE 121* 96 128* 124*  BUN 75* 65* 65* 65*  CREATININE 1.75* 1.93* 1.98* 1.69*  CALCIUM 9.0 8.7* 8.7* 8.4*  MG  --  1.8  --   --   PHOS  --  2.2*  --   --    Liver Function Tests:  Recent Labs Lab 05/27/15 1616 05/29/15 0536  AST 36 27  ALT 18 21  ALKPHOS 59 70  BILITOT 1.9* 1.2  PROT 7.4 7.5  ALBUMIN 2.7* 2.5*   CBC:  Recent Labs Lab 05/27/15 1653 05/28/15 0707 05/29/15 0536 05/30/15 0555  WBC 38.8* 36.2* 34.6* 28.0*  NEUTROABS  --   --  27.3*  --   HGB 8.8* 8.1* 8.5* 8.2*  HCT 28.4* 25.4* 26.3* 25.6*  MCV 78.4* 78.2 77.8* 78.5  PLT 20* 21* 23* 27*   Cardiac Enzymes:  Recent Labs Lab 05/27/15 1616 05/28/15 0226 05/28/15 0707 05/28/15 1515  TROPONINI 0.03 0.04* 0.03 0.03   BNP (last 3 results)  Recent Labs  05/28/15 0226  BNP 515.4*   CBG:  Recent Labs Lab 05/27/15 1923 05/28/15 0835 05/28/15 1137  GLUCAP 114* 95 92    Recent Results (from the past 240 hour(s))  Blood Culture (routine x 2)     Status: None (Preliminary result)   Collection Time: 05/27/15  4:16 PM  Result Value Ref Range Status   Specimen Description BLOOD RIGHT FOREARM  Final   Special Requests   Final    BOTTLES DRAWN AEROBIC AND ANAEROBIC  AER 5CC ANA 3CC   Culture  Setup Time   Final    GRAM NEGATIVE RODS IN BOTH AEROBIC AND ANAEROBIC BOTTLES CRITICAL RESULT CALLED TO, READ BACK BY AND VERIFIED WITH: Mali VAN DYKE AT 3474 05/28/15.PMH CONFIRMED BY [facility]    Culture   Final    SALMONELLA SPECIES IN BOTH AEROBIC AND ANAEROBIC BOTTLES REFERRED TO [facility] IN [place], [place] IDENTIFICATION/CONFIRMATION Results Called to: [place] AT 2595 05/30/15 DV    Report Status PENDING  Incomplete   Organism ID, Bacteria SALMONELLA SPECIES  Final      Susceptibility   Salmonella species - MIC*    AMPICILLIN <=2 SENSITIVE Sensitive     LEVOFLOXACIN <=0.12 SENSITIVE Sensitive     TRIMETH/SULFA <=20 SENSITIVE Sensitive     * SALMONELLA SPECIES  Blood Culture (routine x 2)     Status: None   Collection Time: 05/27/15  4:16 PM  Result Value Ref Range Status   Specimen Description BLOOD RIGHT HAND  Final   Special Requests BOTTLES DRAWN AEROBIC AND ANAEROBIC  1CC  Final   Culture  Setup Time   Final    GRAM NEGATIVE RODS IN BOTH AEROBIC AND ANAEROBIC BOTTLES CRITICAL RESULT CALLED TO, READ BACK BY AND VERIFIED WITH: [place] VAN DYKE AT 6387 05/28/15.PMH CONFIRMED BY [facility] CRITICAL VALUE NOTED.  VALUE IS CONSISTENT WITH PREVIOUSLY REPORTED AND CALLED VALUE.    Culture   Final    SALMONELLA SPECIES IN BOTH AEROBIC AND ANAEROBIC BOTTLES PREVIOUSLY CALLED REFERRED TO [facility] IN [place], [place] FOR IDENTIFICATION/CONFIRMATION    Report Status 05/30/2015 FINAL  Final   Organism ID, Bacteria SALMONELLA SPECIES  Final      Susceptibility   Salmonella species - MIC*    AMPICILLIN <=2 SENSITIVE Sensitive     LEVOFLOXACIN <=0.12 SENSITIVE Sensitive     TRIMETH/SULFA <=20 SENSITIVE Sensitive     * SALMONELLA SPECIES  Urine culture     Status: None   Collection Time: 05/27/15  4:53 PM  Result Value Ref Range Status   Specimen Description URINE, RANDOM  Final   Special Requests NONE  Final   Culture >=100,000 COLONIES/mL ESCHERICHIA COLI  Final   Report Status 05/29/2015 FINAL  Final   Organism ID, Bacteria ESCHERICHIA COLI  Final      Susceptibility   Escherichia coli - MIC*    AMPICILLIN <=2 SENSITIVE Sensitive     CEFTAZIDIME <=1 SENSITIVE Sensitive     CEFAZOLIN <=4 SENSITIVE  Sensitive     CEFTRIAXONE <=1 SENSITIVE Sensitive     CIPROFLOXACIN <=0.25 SENSITIVE Sensitive     GENTAMICIN <=1 SENSITIVE Sensitive     IMIPENEM <=0.25 SENSITIVE Sensitive     TRIMETH/SULFA <=20 SENSITIVE Sensitive     NITROFURANTOIN Value in next row Sensitive      SENSITIVE<=16    PIP/TAZO Value in next row Sensitive      SENSITIVE<=4    * >=100,000 COLONIES/mL ESCHERICHIA COLI  MRSA PCR Screening     Status: None   Collection Time: 05/27/15  7:36 PM  Result Value Ref Range Status   MRSA by PCR NEGATIVE NEGATIVE Final    Comment:        The GeneXpert MRSA Assay (FDA approved for NASAL specimens only), is one component of a comprehensive MRSA colonization surveillance program. It is not intended to diagnose MRSA infection nor to guide or monitor treatment for MRSA infections.      Studies: Mr Wrist Right Wo Contrast  05/30/2015   CLINICAL DATA:  Right wrist deformity and pain.  EXAM: MR OF THE RIGHT WRIST WITHOUT CONTRAST  TECHNIQUE: Multiplanar, multisequence MR imaging of the right wrist was performed. No intravenous contrast was administered.  COMPARISON:  05/27/2015  FINDINGS: Despite efforts by the technologist and patient, motion artifact is present on today's exam and could not be eliminated. This reduces exam sensitivity and specificity. The patient had difficulty tolerating imaging.  Ligaments: Greater than expected fluid signal along the proximal scapholunate interval, but quite likely due to erosion rather than definite scapholunate ligament tear. No scapholunate separation is identified.  Triangular fibrocartilage: TFCC disc not well seen, suspected chronic tear.  Tendons: Prominent fluid distention of the tendon sheaths of extensor digitorum compartment (extensor compartment 4) at the level of the wrist, in continuity with the eroded effusion of the distal radioulnar joint, with associated filling defects compatible with synovitis. Possibly torn or deficient extensor  digitorum tendons of the third and fourth fingers, which seem to reconstitute along the distal margin of the cystic collection.  Carpal tunnel/median nerve: Grossly unremarkable.  Guyon's canal: Unremarkable  Joint/cartilage: Prominent erosions along the distal radioulnar joint with contiguity of the dry to with the fourth extensor compartment in the radiocarpal joint.  Bones/carpal alignment: In addition to the large erosions of the distal radial ulnar  joint, there are degenerative appearing findings with spurring and loss of articular space at the first and second carpometacarpal articulations.  Other: No supplemental non-categorized findings.  IMPRESSION: 1. 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 Portions of the extensor digitorum tendons, including those extending to the third and fourth fingers, may be torn or partially torn. Differential diagnostic considerations favor rheumatoid arthritis, gout, or similar erosive arthropathy. Infection unlikely but not entirely excluded. It should be straightforward to sample the dorsal fluid collection with a needle, the large fluid collection is only 4 mm below the skin surface. 2. Degenerative findings at the first and second carpometacarpal articulations.   Electronically Signed   By: Van Clines M.D.   On: 05/30/2015 14:29   Dg Shoulder Left Port  05/28/2015   CLINICAL DATA:  Bilateral shoulder pain  EXAM: LEFT SHOULDER - 1 VIEW  COMPARISON:  None.  FINDINGS: No fracture or dislocation is seen.  Mild degenerative changes of the acromioclavicular joint.  The visualized soft tissues are unremarkable.  Visualized left lung is essentially clear.  IMPRESSION: No fracture or dislocation is seen.   Electronically Signed   By: Julian Hy M.D.    On: 05/28/2015 21:29   Dg Shoulder Right Port  05/28/2015   CLINICAL DATA:  Bilateral shoulder pain  EXAM: PORTABLE RIGHT SHOULDER - 2+ VIEW  COMPARISON:  None.  FINDINGS: No fracture or dislocation is seen.  Degenerative changes of the acromioclavicular joint.  Visualized soft tissues are within normal limits.  Visualized right lung is clear.  IMPRESSION: No fracture or dislocation is seen.   Electronically Signed   By: Julian Hy M.D.   On: 05/28/2015 21:29    Scheduled Meds: . antiseptic oral rinse  7 mL Mouth Rinse q12n4p  . cefTRIAXone (ROCEPHIN)  IV  1 g Intravenous Q24H  . chlorhexidine  15 mL Mouth Rinse BID  . furosemide  20 mg Oral Daily  . metoprolol tartrate  25 mg Oral BID  . pantoprazole  40 mg Oral BID AC   Continuous Infusions:   Active Problems:   Acute respiratory failure with hypoxemia   Acute respiratory failure   Right wrist deformity    Time spent: 35 minutes    [place], [facility] Pager [phone]. If 7PM-7AM, please contact night-coverage at [email or web address], password [facility] 05/30/2015, 4:55 PM  LOS: 3 days

## 2015-05-31 ENCOUNTER — Other Ambulatory Visit: Payer: Self-pay | Admitting: Family Medicine

## 2015-05-31 ENCOUNTER — Inpatient Hospital Stay (HOSPITAL_COMMUNITY): Payer: Medicare Other

## 2015-05-31 DIAGNOSIS — D509 Iron deficiency anemia, unspecified: Secondary | ICD-10-CM

## 2015-05-31 DIAGNOSIS — D72829 Elevated white blood cell count, unspecified: Secondary | ICD-10-CM

## 2015-05-31 DIAGNOSIS — C9110 Chronic lymphocytic leukemia of B-cell type not having achieved remission: Secondary | ICD-10-CM

## 2015-05-31 DIAGNOSIS — R410 Disorientation, unspecified: Secondary | ICD-10-CM

## 2015-05-31 DIAGNOSIS — D638 Anemia in other chronic diseases classified elsewhere: Secondary | ICD-10-CM

## 2015-05-31 DIAGNOSIS — A029 Salmonella infection, unspecified: Secondary | ICD-10-CM

## 2015-05-31 DIAGNOSIS — Z79899 Other long term (current) drug therapy: Secondary | ICD-10-CM

## 2015-05-31 DIAGNOSIS — R7881 Bacteremia: Secondary | ICD-10-CM | POA: Diagnosis present

## 2015-05-31 DIAGNOSIS — D696 Thrombocytopenia, unspecified: Secondary | ICD-10-CM

## 2015-05-31 DIAGNOSIS — D630 Anemia in neoplastic disease: Secondary | ICD-10-CM

## 2015-05-31 DIAGNOSIS — A498 Other bacterial infections of unspecified site: Secondary | ICD-10-CM

## 2015-05-31 DIAGNOSIS — D47Z9 Other specified neoplasms of uncertain behavior of lymphoid, hematopoietic and related tissue: Secondary | ICD-10-CM

## 2015-05-31 LAB — SYNOVIAL CELL COUNT + DIFF, W/ CRYSTALS
Crystals, Fluid: NONE SEEN
EOSINOPHILS-SYNOVIAL: 0 % (ref 0–1)
Lymphocytes-Synovial Fld: 0 % (ref 0–20)
MONOCYTE-MACROPHAGE-SYNOVIAL FLUID: 9 % — AB (ref 50–90)
NEUTROPHIL, SYNOVIAL: 91 % — AB (ref 0–25)
WBC, Synovial: 222500 /mm3 — ABNORMAL HIGH (ref 0–200)

## 2015-05-31 LAB — BASIC METABOLIC PANEL
ANION GAP: 6 (ref 5–15)
BUN: 56 mg/dL — ABNORMAL HIGH (ref 6–20)
CHLORIDE: 114 mmol/L — AB (ref 101–111)
CO2: 27 mmol/L (ref 22–32)
Calcium: 8.6 mg/dL — ABNORMAL LOW (ref 8.9–10.3)
Creatinine, Ser: 1.68 mg/dL — ABNORMAL HIGH (ref 0.44–1.00)
GFR calc Af Amer: 34 mL/min — ABNORMAL LOW (ref >60)
GFR calc non Af Amer: 29 mL/min — ABNORMAL LOW (ref >60)
GLUCOSE: 116 mg/dL — AB (ref 65–99)
POTASSIUM: 3.9 mmol/L (ref 3.5–5.1)
Sodium: 147 mmol/L — ABNORMAL HIGH (ref 135–145)

## 2015-05-31 LAB — CBC
HEMATOCRIT: 25.3 % — AB (ref 36.0–46.0)
HEMOGLOBIN: 8.1 g/dL — AB (ref 12.0–15.0)
MCH: 25.6 pg — AB (ref 26.0–34.0)
MCHC: 32 g/dL (ref 30.0–36.0)
MCV: 79.8 fL (ref 78.0–100.0)
Platelets: 8 10*3/uL — CL (ref 150–400)
RBC: 3.17 MIL/uL — ABNORMAL LOW (ref 3.87–5.11)
RDW: 21.3 % — ABNORMAL HIGH (ref 11.5–15.5)
WBC: 31.7 10*3/uL — ABNORMAL HIGH (ref 4.0–10.5)

## 2015-05-31 LAB — GLUCOSE, CAPILLARY: GLUCOSE-CAPILLARY: 99 mg/dL (ref 65–99)

## 2015-05-31 MED ORDER — DEXTROSE 5 % IV SOLN
2.0000 g | INTRAVENOUS | Status: DC
  Administered 2015-05-31: 2 g via INTRAVENOUS
  Filled 2015-05-31 (×3): qty 2

## 2015-05-31 MED ORDER — PREDNISONE 50 MG PO TABS
60.0000 mg | ORAL_TABLET | Freq: Every day | ORAL | Status: DC
  Administered 2015-06-01 – 2015-06-02 (×2): 60 mg via ORAL
  Filled 2015-05-31 (×4): qty 1

## 2015-05-31 MED ORDER — OXYBUTYNIN CHLORIDE 5 MG PO TABS
5.0000 mg | ORAL_TABLET | Freq: Every day | ORAL | Status: DC
  Administered 2015-06-01 – 2015-06-07 (×7): 5 mg via ORAL
  Filled 2015-05-31 (×7): qty 1

## 2015-05-31 MED ORDER — METHYLPREDNISOLONE SODIUM SUCC 125 MG IJ SOLR
125.0000 mg | Freq: Every day | INTRAMUSCULAR | Status: DC
  Administered 2015-05-31: 125 mg via INTRAVENOUS
  Filled 2015-05-31: qty 2

## 2015-05-31 NOTE — Evaluation (Signed)
Physical Therapy Evaluation Patient Details Name: [name] MRN: [id number] DOB: Apr 22, 1941 Today's Date: 05/31/2015   History of Present Illness  Patient is a very pleasant 74 year old African-American female with past medical history significant for CML, ITP, anemia of chronic disease, hypertension, hyperlipidemia, chronic diastolic heart failure on echocardiogram in 2014, who was taken to the emergency department by her son Dallas Breeding, for altered mental status.  Positive for E. coli UTI.  Clinical Impression  Patient presents with decreased independence with mobility due to deficits listed in PT problem list.  She will benefit from skilled PT in the acute setting to allow return home with intermittent assist following SNF level rehab stay.  Unsure if family able to provide 24 hour care, if able to provide physical assist may be able to go home with HHPT.    Follow Up Recommendations SNF;Supervision/Assistance - 24 hour    Equipment Recommendations  None recommended by PT    Recommendations for Other Services       Precautions / Restrictions Precautions Precautions: Fall      Mobility  Bed Mobility               General bed mobility comments: NT pt on edge of bed  Transfers Overall transfer level: Needs assistance Equipment used: Rolling walker (2 wheeled) Transfers: Sit to/from [facility] Sit to Stand: Mod assist Stand pivot transfers: Mod assist       General transfer comment: increased time and assist for coming forward and lifting assist to stand; unable to take steps to walk around bed to chair so sat back on bed and brought chair to bedside for stand pivot transfer with increased time for stepping around with RW  Ambulation/Gait                Stairs            Wheelchair Mobility    Modified Rankin (Stroke Patients Only)       Balance Overall balance assessment: Needs assistance   Sitting balance-Leahy Scale: Fair     Standing balance support: Bilateral upper extremity supported Standing balance-Leahy Scale: Poor Standing balance comment: assist with RW for balance                             Pertinent Vitals/Pain Pain Assessment: No/denies pain    Home Living Family/patient expects to be discharged to:: Private residence Living Arrangements: Children Available Help at Discharge: Family;Available PRN/intermittently Type of Home: House       Home Layout: One level Home Equipment: [place] - 2 wheels;Bedside commode;Wheelchair - manual Additional Comments: Info obtained but patient poor historian and mainly only answering yes/no questions    Prior Function Level of Independence: Needs assistance   Gait / Transfers Assistance Needed: reports using walker at home, then admits to not walking for a while.  Patient poor historian           Hand Dominance        Extremity/Trunk Assessment   Upper Extremity Assessment: RUE deficits/detail;LUE deficits/detail RUE Deficits / Details: AAROM limited shoulder flexion about 95, strength grossly 3-/5, still wtih edema on dorsum of wrist and sore with AAROM     LUE Deficits / Details: AAROM limited shoulder flexion about 95, strength grossly 3-/5   Lower Extremity Assessment: RLE deficits/detail;LLE deficits/detail RLE Deficits / Details: AROM grossly WFL, strength at least 3+/5 LLE Deficits / Details: AROM grossly WFL, strength at least  3+/5  Cervical / Trunk Assessment: Kyphotic  Communication   Communication: Other (comment) (limited verbal responses)  Cognition Arousal/Alertness: Lethargic Behavior During Therapy: Flat affect Overall Cognitive Status: No family/caregiver present to determine baseline cognitive functioning Area of Impairment: Following commands;Problem solving       Following Commands: Follows one step commands with increased time;Follows one step commands inconsistently     Problem Solving: Slow  processing;Decreased initiation;Requires verbal cues;Requires tactile cues General Comments: Patient with limited verbal responses; was leaning over sitting edge of bed when I entered    General Comments General comments (skin integrity, edema, etc.): soiled with urine sitting edge of bed    Exercises        Assessment/Plan    PT Assessment Patient needs continued PT services  PT Diagnosis Generalized weakness;Difficulty walking   PT Problem List Decreased strength;Decreased activity tolerance;Decreased balance;Decreased mobility;Decreased knowledge of use of DME;Decreased cognition;Decreased safety awareness  PT Treatment Interventions DME instruction;Gait training;Balance training;Patient/family education;Functional mobility training;Therapeutic activities;Therapeutic exercise   PT Goals (Current goals can be found in the Care Plan section) Acute Rehab PT Goals Patient Stated Goal: None stated PT Goal Formulation: Patient unable to participate in goal setting Time For Goal Achievement: 06/14/15 Potential to Achieve Goals: Good    Frequency Min 3X/week   Barriers to discharge Decreased caregiver support unsure if 24 hour assist available    Co-evaluation               End of Session Equipment Utilized During Treatment: Oxygen Activity Tolerance: Patient limited by fatigue;Patient limited by lethargy Patient left: in chair;with call bell/phone within reach;with chair alarm set Nurse Communication: Mobility status         Time: 5409-8119 PT Time Calculation (min) (ACUTE ONLY): 29 min   Charges:   PT Evaluation $Initial PT Evaluation Tier I: 1 Procedure PT Treatments $Therapeutic Activity: 8-22 mins   PT G Codes:        Dejay Kronk,CYNDI 06/04/2015, 12:57 PM  Magda Kiel, [place] June 04, 2015

## 2015-05-31 NOTE — Progress Notes (Signed)
[facility] PROGRESS NOTE  [name] HAL:[id number] DOB: 02-22-1941 DOA: 05/27/2015 PCP: Leotis Shames, MD  Assessment/Plan: 1-Sepsis due to E. Coli UTI -Afebrile.  -will continue rocephin -sensitivity demonstrating abx choice is appropriate -will continue supportive care and follow clinical response -PRN antipyretics   2-Acute hypoxemic resp failure: with ?? Pulmonary edema -patient initially required BIPAP -now breathing easier and maintaining O2 sat on [place] -will continue lasix PO now and follow volume status -will continue flutter valve -continue PRN nebulizer and will start pulmicort Weight :  56--60--61.55   3-Acute on chronic diastolic heart failure -neg troponin and no CP -will follow daily weights, strict intake and output and continue diuretics  4-Acute on chronic renal failure: stage 3 at baseline with Cr at 1.5 -Cr currently 1.6 -will continue tx for UTI -on PO lasix now, home dose -will monitor Cr trend  5-Toxic metabolic encephalopathy: due to UTI -will monitor and continue constant re-orientation -more sleepy today. Will monitor closely. Will do stat CT head, due to thrombocytopenia and AMS.  -check CBG.   6-Right wrist deformity and pain: -consistently with gout as per x-ray -orthopedic service consulted -MRI; erosive arthropathy radioulnar joint. Differential RA, Gout. S/P fluid aspiration.  -synovial fluid cell count pending.   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  -Platelet lower today. Oncologist consulted. Started IV solumedrol.    8-[place] Bacteremia:  ID consulted.   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   11-lactic acid: resolved.  HTN: stable -will continue metoprolol and lasix  Code Status: Full Family  Communication: patient  Disposition Plan: to be determine; but most likely will require SNF for rehab; family ok with it if needed   Consultants:  PCCM  Procedures:  2-D echo - Left ventricle: The cavity size was normal. Wall thickness was normal. Systolic function was normal. The estimated ejection fraction was in the range of 60% to 65%. Wall motion was normal; there were no regional wall motion abnormalities. Doppler parameters are consistent with abnormal left ventricular relaxation (grade 1 diastolic dysfunction). - Aortic valve: There was no stenosis. - Mitral valve: There was trivial regurgitation. - Left atrium: The atrium was moderately dilated. - Right ventricle: The cavity size was normal. Systolic function was normal. - Tricuspid valve: Peak RV-RA gradient (S): 40 mm Hg. - Pulmonary arteries: PA peak pressure: 43 mm Hg (S). - Inferior vena cava: The vessel was normal in size. The respirophasic diameter changes were in the normal range (>= 50%), consistent with normal central venous pressure.  Impressions: - Normal LV size with EF 60-65%. Normal RV size and systolic function. Moderate LAE. No significant valvular abnormalities. Mild pulmonary hypertension.  Antibiotics: Vanc 8/14 >>8/14 Zosyn 8/14 >> 8/14 Rocephin 8/14 >>   HPI/Subjective: She is sitting in the chair. She was oriented to place, time person. She feels sleepy, fall sleep in between conversation. follos command.   Objective: Filed Vitals:   05/31/15 1000  BP: 170/83  Pulse: 96  Temp: 98.2 F (36.8 C)  Resp: 20    Intake/Output Summary (Last 24 hours) at 05/31/15 1124 Last data filed at 05/31/15 0900  Gross per 24 hour  Intake    510 ml  Output      0 ml  Net    510 ml   Filed Weights   05/28/15  2104 05/30/15 0542 05/30/15 1947  Weight: 60.782 kg (134 lb) 61 kg (134 lb 7.7 oz) 55.792 kg (123 lb)    Exam:   General: lethargy, answer questions.    Cardiovascular: S1 and S2, no rubs or gallops  Respiratory: scattered rhonchi, no frank crackles; positive mild exp wheezing   Abdomen: soft, NT, ND, positive BS  Musculoskeletal: right hand with wrist deformity and pain to touch; no erythema seen,   Data Reviewed: Basic Metabolic Panel:  Recent Labs Lab 05/27/15 1616 05/28/15 0707 05/29/15 0536 05/30/15 0555 05/31/15 0605  NA 142 140 144 142 147*  K 4.1 3.9 3.4* 3.2* 3.9  CL 110 110 111 109 114*  CO2 20* 24 22 23 27   GLUCOSE 121* 96 128* 124* 116*  BUN 75* 65* 65* 65* 56*  CREATININE 1.75* 1.93* 1.98* 1.69* 1.68*  CALCIUM 9.0 8.7* 8.7* 8.4* 8.6*  MG  --  1.8  --   --   --   PHOS  --  2.2*  --   --   --    Liver Function Tests:  Recent Labs Lab 05/27/15 1616 05/29/15 0536  AST 36 27  ALT 18 21  ALKPHOS 59 70  BILITOT 1.9* 1.2  PROT 7.4 7.5  ALBUMIN 2.7* 2.5*   CBC:  Recent Labs Lab 05/27/15 1653 05/28/15 0707 05/29/15 0536 05/30/15 0555 05/31/15 0605  WBC 38.8* 36.2* 34.6* 28.0* 31.7*  NEUTROABS  --   --  27.3*  --   --   HGB 8.8* 8.1* 8.5* 8.2* 8.1*  HCT 28.4* 25.4* 26.3* 25.6* 25.3*  MCV 78.4* 78.2 77.8* 78.5 79.8  PLT 20* 21* 23* 27* 8*   Cardiac Enzymes:  Recent Labs Lab 05/27/15 1616 05/28/15 0226 05/28/15 0707 05/28/15 1515  TROPONINI 0.03 0.04* 0.03 0.03   BNP (last 3 results)  Recent Labs  05/28/15 0226  BNP 515.4*   CBG:  Recent Labs Lab 05/27/15 1923 05/28/15 0835 05/28/15 1137  GLUCAP 114* 95 92    Recent Results (from the past 240 hour(s))  Blood Culture (routine x 2)     Status: None (Preliminary result)   Collection Time: 05/27/15  4:16 PM  Result Value Ref Range Status   Specimen Description BLOOD RIGHT FOREARM  Final   Special Requests   Final    BOTTLES DRAWN AEROBIC AND ANAEROBIC  AER 5CC ANA 3CC   Culture  Setup Time   Final    GRAM NEGATIVE RODS IN BOTH AEROBIC AND ANAEROBIC BOTTLES CRITICAL RESULT CALLED TO, READ BACK BY AND VERIFIED WITH: Mali VAN  DYKE AT 5361 05/28/15.PMH CONFIRMED BY [facility]    Culture   Final    SALMONELLA SPECIES IN BOTH AEROBIC AND ANAEROBIC BOTTLES REFERRED TO [facility] IN [place], [place] IDENTIFICATION/CONFIRMATION Results Called to: [place] AT 4431 05/30/15 DV    Report Status PENDING  Incomplete   Organism ID, Bacteria SALMONELLA SPECIES  Final      Susceptibility   Salmonella species - MIC*    AMPICILLIN <=2 SENSITIVE Sensitive     LEVOFLOXACIN <=0.12 SENSITIVE Sensitive     TRIMETH/SULFA <=20 SENSITIVE Sensitive     * SALMONELLA SPECIES  Blood Culture (routine x 2)     Status: None   Collection Time: 05/27/15  4:16 PM  Result Value Ref Range Status   Specimen Description BLOOD RIGHT HAND  Final   Special Requests BOTTLES DRAWN AEROBIC AND ANAEROBIC  [place]  Final   Culture  Setup  Time   Final    GRAM NEGATIVE RODS IN BOTH AEROBIC AND ANAEROBIC BOTTLES CRITICAL RESULT CALLED TO, READ BACK BY AND VERIFIED WITH: Mal[place] VAN DYKE AT 3329 05/28/15.PMH CONFIRMED BY [facility] CRITICAL VALUE NOTED.  VALUE IS CONSISTENT WITH PREVIOUSLY REPORTED AND CALLED VALUE.    Culture   Final    SALMONELLA SPECIES IN BOTH AEROBIC AND ANAEROBIC BOTTLES PREVIOUSLY CALLED REFERRED TO [facility] IN [place], [place] FOR IDENTIFICATION/CONFIRMATION    Report Status 05/30/2015 FINAL  Final   Organism ID, Bacteria SALMONELLA SPECIES  Final      Susceptibility   Salmonella species - MIC*    AMPICILLIN <=2 SENSITIVE Sensitive     LEVOFLOXACIN <=0.12 SENSITIVE Sensitive     TRIMETH/SULFA <=20 SENSITIVE Sensitive     * SALMONELLA SPECIES  Urine culture     Status: None   Collection Time: 05/27/15  4:53 PM  Result Value Ref Range Status   Specimen Description URINE, RANDOM  Final   Special Requests NONE  Final   Culture >=100,000 COLONIES/mL ESCHERICHIA COLI  Final   Report Status 05/29/2015 FINAL  Final   Organism ID, Bacteria ESCHERICHIA COLI  Final       Susceptibility   Escherichia coli - MIC*    AMPICILLIN <=2 SENSITIVE Sensitive     CEFTAZIDIME <=1 SENSITIVE Sensitive     CEFAZOLIN <=4 SENSITIVE Sensitive     CEFTRIAXONE <=1 SENSITIVE Sensitive     CIPROFLOXACIN <=0.25 SENSITIVE Sensitive     GENTAMICIN <=1 SENSITIVE Sensitive     IMIPENEM <=0.25 SENSITIVE Sensitive     TRIMETH/SULFA <=20 SENSITIVE Sensitive     NITROFURANTOIN Value in next row Sensitive      SENSITIVE<=16    PIP/TAZO Value in next row Sensitive      SENSITIVE<=4    * >=100,000 COLONIES/mL ESCHERICHIA COLI  MRSA PCR Screening     Status: None   Collection Time: 05/27/15  7:36 PM  Result Value Ref Range Status   MRSA by PCR NEGATIVE NEGATIVE Final    Comment:        The GeneXpert MRSA Assay (FDA approved for NASAL specimens only), is one component of a comprehensive MRSA colonization surveillance program. It is not intended to diagnose MRSA infection nor to guide or monitor treatment for MRSA infections.      Studies: Mr Wrist Right Wo Contrast  05/30/2015   CLINICAL DATA:  Right wrist deformity and pain.  EXAM: MR OF THE RIGHT WRIST WITHOUT CONTRAST  TECHNIQUE: Multiplanar, multisequence MR imaging of the right wrist was performed. No intravenous contrast was administered.  COMPARISON:  05/27/2015  FINDINGS: Despite efforts by the technologist and patient, motion artifact is present on today's exam and could not be eliminated. This reduces exam sensitivity and specificity. The patient had difficulty tolerating imaging.  Ligaments: Greater than expected fluid signal along the proximal scapholunate interval, but quite likely due to erosion rather than definite scapholunate ligament tear. No scapholunate separation is identified.  Triangular fibrocartilage: TFCC disc not well seen, suspected chronic tear.  Tendons: Prominent fluid distention of the tendon sheaths of extensor digitorum compartment (extensor compartment 4) at the level of the wrist, in continuity with  the eroded effusion of the distal radioulnar joint, with associated filling defects compatible with synovitis. Possibly torn or deficient extensor digitorum tendons of the third and fourth fingers, which seem to reconstitute along the distal margin of the cystic collection.  Carpal tunnel/median nerve: Grossly unremarkable.  Guyon's canal:  Unremarkable  Joint/cartilage: Prominent erosions along the distal radioulnar joint with contiguity of the dry to with the fourth extensor compartment in the radiocarpal joint.  Bones/carpal alignment: In addition to the large erosions of the distal radial ulnar joint, there are degenerative appearing findings with spurring and loss of articular space at the first and second carpometacarpal articulations.  Other: No supplemental non-categorized findings.  IMPRESSION: 1. 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 Portions of the extensor digitorum tendons, including those extending to the third and fourth fingers, may be torn or partially torn. Differential diagnostic considerations favor rheumatoid arthritis, gout, or similar erosive arthropathy. Infection unlikely but not entirely excluded. It should be straightforward to sample the dorsal fluid collection with a needle, the large fluid collection is only 4 mm below the skin surface. 2. Degenerative findings at the first and second carpometacarpal articulations.   Electronically Signed   By: Van Clines M.D.   On: 05/30/2015 14:29    Scheduled Meds: . antiseptic oral rinse  7 mL Mouth Rinse q12n4p  . cefTRIAXone (ROCEPHIN)  IV  1 g Intravenous Q24H  . chlorhexidine  15 mL Mouth Rinse BID  . furosemide  20 mg Oral Daily  . furosemide  20 mg Oral Once  . metoprolol tartrate  25 mg Oral BID  .  pantoprazole  40 mg Oral BID AC   Continuous Infusions:   Active Problems:   Acute respiratory failure with hypoxemia   Acute respiratory failure   Right wrist deformity    Time spent: 35 minutes    Zeek Rostron, [facility] Pager [phone]. If 7PM-7AM, please contact night-coverage at [email or web address], password [email or web address] 05/31/2015, 11:24 AM  LOS: 4 days

## 2015-05-31 NOTE — Consult Note (Signed)
[place]  [phone]   Requesting Provider: [facility]  Consulting Provider:  Zola Button ,MD  Primary Oncologist:  Barbette Reichmann, MD at [facility]  Patient Care Team: Leotis Shames, MD as PCP - General (Family Medicine)  HOSPITAL CONSULTATION  NOTE  Reason for Consultation: Thrombocytopenia  HPI: Ms. Pyeatt This 74 year old woman with a history of ITP, CML, as well as iron deficiency anemia status post multiple iron infusions [facility] by Dr. Inez Pilgrim, Last seen on 04/24/2015. The patient was admitted on 05/27/2015 for an [employer] due to altered mental status, Acute, and worsening since admission. The patient was noted to be febrile and incontinent.Status was complicated with acute hypoxemic respiratory failure requiring BiPAP. Initially she was admitted to the intensive care unit, and supportive management was initiated. She received IV fluids, IV antibiotics, with some improvement, however, her mental status remains present. Cultures Were positive for Escherichia coli UTI, As well as Salmonella in blood, with ID consult pending.. A CT of the head is To be performed today. Other history is not able to be obtained, due to the patient's confusion.Per nursing report, there are no bleeding issues such as hemoptysis, epistaxis, hematuria, or hematochezia. No reported bruising. No petechial rashes were noted either.  CBC On presentation was remarkable for a platelet count of 20,000, With a white count of 38.8. While initially her platelets were increasing to 27,000, She experienced significant drop to 80,000 today, with a white count at 31.7. Her hemoglobin remained stable at 8.1, without any bleeding issues noted.  Last LDH was 127 On 05/28/2015, normal. Of note, urine cultures are positive for Escherichia coli UTI, which she is receiving processing, which may be having a role in these results. We were  requested to see the patient in consultation, with recommendations from the hematological standpoint.     Past Medical History  Diagnosis Date  . Hypertension   . Hyperlipidemia   ITP GERD Gout Chronic renal failure Chronic diastolic heart failure   MEDICATIONS:  Scheduled Meds: . antiseptic oral rinse  7 mL Mouth Rinse q12n4p  . cefTRIAXone (ROCEPHIN)  IV  2 g Intravenous Q24H  . chlorhexidine  15 mL Mouth Rinse BID  . furosemide  20 mg Oral Daily  . furosemide  20 mg Oral Once  . methylPREDNISolone (SOLU-MEDROL) injection  125 mg Intravenous Daily  . metoprolol tartrate  25 mg Oral BID  . pantoprazole  40 mg Oral BID AC   Continuous Infusions:  PRN Meds:.sodium chloride, acetaminophen, acetaminophen, ipratropium-albuterol, labetalol, ondansetron (ZOFRAN) IV  ALLERGIES:  Allergies  Allergen Reactions  . Aspirin Rash    Review of systems:   Other review of systems unable to be obtained due to the patient's acute mental status.    No family history on file.   No past surgical history on file.  Social History   Social History  . Marital Status: Married    Spouse Name: N/A  . Number of Children: 2  . Years of Education: N/A   Occupational History  . Not on file.   Social History Main Topics  . Smoking status: Never Smoker   . Smokeless tobacco: Not on file  . Alcohol Use: No  . Drug Use: No  . Sexual Activity: Not on file   Other Topics Concern  . Not on file   Social History Narrative     PHYSICAL EXAMINATION:  Filed Vitals:   05/31/15  1000  BP: 170/83  Pulse: 96  Temp: 98.2 F (36.8 C)  Resp: 20      Intake/Output Summary (Last 24 hours) at 05/31/15 1158 Last data filed at 05/31/15 0900  Gross per 24 hour  Intake    510 ml  Output      0 ml  Net    510 ml    GENERAL Lethargic, no distress and comfortable, Falls back asleep. SKIN: skin color, texture, turgor are normal, no rashes or significant lesions EYES: normal, conjunctiva  are pink and non-injected, sclera clear OROPHARYNX:no exudate, no erythema and lips, buccal mucosa, and tongue with white coating NECK: supple, thyroid normal size, non-tender, without nodularity LYMPH:  no palpable lymphadenopathy in the cervical, axillary or inguinal LUNGS:Decreased breath sounds at the bases, with some crackles audible, mild expiratory wheezing with normal breathing effort HEART: regular rate & rhythm and no murmurs and no lower extremity edema ABDOMEN: soft, non-tender and normal bowel sounds Musculoskeletal:no cyanosis of digits and no clubbing. Right hand and wrist deformity, ovoid orthopedics, suspected related to gout  PSYCH: The patient is unable to engage in conversation at this time, lethargic NEURO: Although lethargic, she is able to follow single commands, no apparent focal motor/sensory deficits  LABORATORY/RADIOLOGY DATA:   Recent Labs Lab 05/27/15 1653 05/28/15 0707 05/29/15 0536 05/30/15 0555 05/31/15 0605  WBC 38.8* 36.2* 34.6* 28.0* 31.7*  HGB 8.8* 8.1* 8.5* 8.2* 8.1*  HCT 28.4* 25.4* 26.3* 25.6* 25.3*  PLT 20* 21* 23* 27* 8*  MCV 78.4* 78.2 77.8* 78.5 79.8  MCH 24.4* 24.9* 25.1* 25.2* 25.6*  MCHC 31.1* 31.9 32.3 32.0 32.0  RDW 21.6* 21.5* 21.8* 21.4* 21.3*  LYMPHSABS  --   --  2.1  --   --   MONOABS  --   --  5.2*  --   --   EOSABS  --   --  0.0  --   --   BASOSABS  --   --  0.0  --   --     CMP    Recent Labs Lab 05/27/15 1616 05/28/15 0707 05/29/15 0536 05/30/15 0555 05/31/15 0605  NA 142 140 144 142 147*  K 4.1 3.9 3.4* 3.2* 3.9  CL 110 110 111 109 114*  CO2 20* 24 22 23 27   GLUCOSE 121* 96 128* 124* 116*  BUN 75* 65* 65* 65* 56*  CREATININE 1.75* 1.93* 1.98* 1.69* 1.68*  CALCIUM 9.0 8.7* 8.7* 8.4* 8.6*  MG  --  1.8  --   --   --   AST 36  --  27  --   --   ALT 18  --  21  --   --   ALKPHOS 59  --  70  --   --   BILITOT 1.9*  --  1.2  --   --         Component Value Date/Time   BILITOT 1.2 05/29/2015 0536    BILITOT 0.3 06/16/2014 1220    Anemia panel:   No results for input(s): VITAMINB12, FOLATE, FERRITIN, TIBC, IRON, RETICCTPCT in the last 72 hours.   Recent Labs  05/28/15 1246  TSH 2.150        Component Value Date/Time   ESRSEDRATE 75* 09/27/2013 0929     Recent Labs Lab 05/27/15 1616  INR 1.50      Urinalysis    Component Value Date/Time   COLORURINE YELLOW* 05/27/2015 [place] 06/28/2013 1645   APPEARANCEUR  HAZY* 05/27/2015 1653   APPEARANCEUR CLOUDY 06/28/2013 1645   LABSPEC 1.013 05/27/2015 1653   LABSPEC >=1.030 06/28/2013 1645   PHURINE 5.0 05/27/2015 1653   PHURINE 6.0 06/28/2013 1645   GLUCOSEU NEGATIVE 05/27/2015 [place] 06/28/2013 1645   HGBUR 2+* 05/27/2015 1653   HGBUR TRACE 06/28/2013 1645   BILIRUBINUR NEGATIVE 05/27/2015 1653   BILIRUBINUR 3+ 06/28/2013 [place] 05/27/2015 [place] 06/28/2013 1645   PROTEINUR 30* 05/27/2015 1653   PROTEINUR 300 mg/dL 06/28/2013 1645   UROBILINOGEN 1.0 05/10/2013 1506   NITRITE NEGATIVE 05/27/2015 1653   NITRITE NEGATIVE 06/28/2013 1645   LEUKOCYTESUR 2+* 05/27/2015 1653   LEUKOCYTESUR TRACE 06/28/2013 1645   Liver Function Tests:  Recent Labs Lab 05/27/15 1616 05/29/15 0536  AST 36 27  ALT 18 21  ALKPHOS 59 70  BILITOT 1.9* 1.2  PROT 7.4 7.5  ALBUMIN 2.7* 2.5*   CBG:  Recent Labs Lab 05/27/15 1923 05/28/15 0835 05/28/15 1137  GLUCAP 114* 95 92    Thyroid function studies  Recent Labs  05/28/15 1246  TSH 2.150    Radiology Studies:   Mr Wrist Right Wo Contrast  05/30/2015   CLINICAL DATA:  Right wrist deformity and pain.  EXAM: MR OF THE RIGHT WRIST WITHOUT CONTRAST  TECHNIQUE: Multiplanar, multisequence MR imaging of the right wrist was performed. No intravenous contrast was administered.  COMPARISON:  05/27/2015  FINDINGS: Despite efforts by the technologist and patient, motion artifact is present on today's  exam and could not be eliminated. This reduces exam sensitivity and specificity. The patient had difficulty tolerating imaging.  Ligaments: Greater than expected fluid signal along the proximal scapholunate interval, but quite likely due to erosion rather than definite scapholunate ligament tear. No scapholunate separation is identified.  Triangular fibrocartilage: TFCC disc not well seen, suspected chronic tear.  Tendons: Prominent fluid distention of the tendon sheaths of extensor digitorum compartment (extensor compartment 4) at the level of the wrist, in continuity with the eroded effusion of the distal radioulnar joint, with associated filling defects compatible with synovitis. Possibly torn or deficient extensor digitorum tendons of the third and fourth fingers, which seem to reconstitute along the distal margin of the cystic collection.  Carpal tunnel/median nerve: Grossly unremarkable.  Guyon's canal: Unremarkable  Joint/cartilage: Prominent erosions along the distal radioulnar joint with contiguity of the dry to with the fourth extensor compartment in the radiocarpal joint.  Bones/carpal alignment: In addition to the large erosions of the distal radial ulnar joint, there are degenerative appearing findings with spurring and loss of articular space at the first and second carpometacarpal articulations.  Other: No supplemental non-categorized findings.  IMPRESSION: 1. 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 Portions of the extensor digitorum tendons, including those extending to the third and fourth fingers, may be torn or partially torn. Differential diagnostic considerations favor rheumatoid arthritis, gout, or similar erosive arthropathy. Infection unlikely but not entirely excluded. It  should be straightforward to sample the dorsal fluid collection with a needle, the large fluid collection is only 4 mm below the skin surface. 2. Degenerative findings at the first and second carpometacarpal articulations.   Electronically Signed   By: Van Clines M.D.   On: 05/30/2015 14:29       ASSESSMENT AND PLAN:  CMML  History of ITP This is responsive to prednisone, followed at the cancer center in [place] appears to be in the 70s. No bleeding issues are noted at this time. However, her platelet count has dropped significantly to 8,000 since admission, in the setting of acute infection With Escherichia coli UTI and Salmonella in blood requiring antibiotics and IV fluids Her count is expected to protocol her, however, due to these low platelet count,recommend a DIC panel And the peripheral blood smear to rule out any schistocytes. Continue Solu-Medrol as directed She may need transfusion of one unit of platelets to maintain this count to greater than 10,000, or 20,000 if she is bleeding. 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  Leukocytosis This is due to CMML, steroids, as well as infection. No intervention is indicated at this time Will continue to monitor  Full Code   Other medical issues Including Escherichia coli UTI, acute hypoxemic respiratory failure, with possible pulmonary edema, acute on chronic diastolic heart failure, Acute on chronic renal failure, toxic metabolic encephalopathy,Thrush and  gout as per admitting team    [facility] E, PA-C 05/31/2015, 58:87 AM  74 year old woman with history of myeloproliferative disorder although have been described as CML but it is unlikely CMML as a form of a myeloproliferative disorder associated with the dysplasia. She follows up with Dr. Inez Pilgrim at [facility]. She was last seen back in May 2016. She also has a history of ITP and per Dr. Marylene Land note has been stable over the responsive.  She was transferred from [facility] to [facility] for possible sepsis and hypoxia. During her hospital stay, she had declining platelet count down to 8000. She did not have any bleeding episodes described. She also had altered mental status and a CT scan of the head obtained on 8/18 showed no active bleeding.  On my examination, her weight alert woman appeared in no distress. Head was normocephalic and atraumatic with the pupils are round and reactive to light. Sclerae anicteric. Heart was regular rate and lungs showed by basilar crackles. Extremities showed no edema. Skin showed no petechiae or ecchymosis.  Her peripheral smear was personally reviewed and showed no evidence of schistocytosis or red cell fragments. I could not appreciate any dysplasia. Left shift was noted in her white cell count.  Recommendations:  1. From a thrombocytopenia standpoint, I would treat her with Solu-Medrol 125 mg IV 1 and start her on prednisone 60 mg daily. I would monitor her counts on a daily basis and would not transfuse her once she has active bleeding. If she has no response to prednisone we can consider transfusion at that time. IVIG can be used as well.  2. Leukocytosis: Likely related to her myeloproliferative disorder and I do not think any intervention is needed as an inpatient.  3. If her platelet count responds properly, then I would keep her on prednisone 60 mg daily until her discharge.  4. She will need follow-up at [facility] upon her discharge.  I will monitor her counts while  she is in the hospital and further recommendations would be based on those findings.

## 2015-05-31 NOTE — Progress Notes (Signed)
Right wrist dorsal mass aspirated.  No fluid from distal region.   More proximally, I obtained 1/2 mL thick, tan fluid Will send for:  Gram Stain Cx (aerobe, anaerobe, AFB, fungus) Cell count [place], MD Hand Surgery [phone]

## 2015-05-31 NOTE — Consult Note (Signed)
[facility] for Infectious Disease    Date of Admission:  05/27/2015   Total days of antibiotics 5               Reason for Consult: Salmonella bacteremia and possible right wrist infection    Referring Physician: Dr. Niel Hummer  Principal Problem:   Salmonella bacteremia Active Problems:   Sepsis   Right wrist deformity   Acute respiratory failure with hypoxemia   Acute respiratory failure   . antiseptic oral rinse  7 mL Mouth Rinse q12n4p  . cefTRIAXone (ROCEPHIN)  IV  2 g Intravenous Q24H  . chlorhexidine  15 mL Mouth Rinse BID  . furosemide  20 mg Oral Daily  . furosemide  20 mg Oral Once  . metoprolol tartrate  25 mg Oral BID  . pantoprazole  40 mg Oral BID AC  . [START ON 06/01/2015] predniSONE  60 mg Oral Q breakfast    Recommendations: 1. Continue ceftriaxone 2. Await results of right wrist aspirate cultures 3. 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 Certainly her age and underlying CLL are contributing to relative immunosuppression putting her at risk. I will continue ceftriaxone for now. We can consider conversion to oral therapy soon. It does not appear that her right wrist swelling is acute gout. She will probably need 4-6 weeks of antibiotic therapy.   HPI: [name] is a 74 y.o. female who developed weakness, fever and chills one week ago. She's also noted some new swelling in her right wrist. She saw her primary care physician and was prescribed antibiotics for a possible urinary tract infection but could not get them filled right away. She started to become confused leading to admission to the hospital on 05/27/2015. She had a temperature of 103.8. She was treated for presumed Escherichia coli UTI but both admission blood cultures have grown salmonella. MRI revealed erosion of the radial ulnar joint of her right hand with extensive synovitis. Today she  underwent right wrist aspirate. Synovial fluid analysis revealed a white blood cell count of 222,500. No crystals were seen. No organisms were seen on Gram stain. She is feeling better. Her family members confirm that she is looking better and is much less confused.   Review of Systems: Review of systems not obtained due to patient factors.  Past Medical History  Diagnosis Date  . Hypertension   . Hyperlipidemia     Social History  Substance Use Topics  . Smoking status: Never Smoker   . Smokeless tobacco: Not on file  . Alcohol Use: No    No family history on file. Allergies  Allergen Reactions  . Aspirin Rash    OBJECTIVE: Blood pressure 145/80, pulse 90, temperature 98.4 F (36.9 C), temperature source Oral, resp. rate 20, height 5\' 5"  (1.651 m), weight 123 lb (55.792 kg), SpO2 98 %. General: she is drowsy and still somewhat slow to answer questions Skin: no rash Lungs: clear Cor: regular S1 and S2 with no murmurs Abdomen: soft and nontender Extremities: Swelling on the dorsum of the right wrist that is moderately tender to palpation  Lab Results Lab Results  Component Value Date   WBC 31.7* 05/31/2015   HGB 8.1* 05/31/2015   HCT 25.3* 05/31/2015   MCV 79.8 05/31/2015   PLT 8* 05/31/2015    Lab Results  Component Value Date   CREATININE 1.68* 05/31/2015  BUN 56* 05/31/2015   NA 147* 05/31/2015   K 3.9 05/31/2015   CL 114* 05/31/2015   CO2 27 05/31/2015    Lab Results  Component Value Date   ALT 21 05/29/2015   AST 27 05/29/2015   ALKPHOS 70 05/29/2015   BILITOT 1.2 05/29/2015     Microbiology: Recent Results (from the past 240 hour(s))  Blood Culture (routine x 2)     Status: None (Preliminary result)   Collection Time: 05/27/15  4:16 PM  Result Value Ref Range Status   Specimen Description BLOOD RIGHT FOREARM  Final   Special Requests   Final    BOTTLES DRAWN AEROBIC AND ANAEROBIC  AER 5CC ANA 3CC   Culture  Setup Time   Final    GRAM  NEGATIVE RODS IN BOTH AEROBIC AND ANAEROBIC BOTTLES CRITICAL RESULT CALLED TO, READ BACK BY AND VERIFIED WITH: [place] VAN DYKE AT 7782 05/28/15.PMH CONFIRMED BY [facility]    Culture   Final    SALMONELLA SPECIES IN BOTH AEROBIC AND ANAEROBIC BOTTLES REFERRED TO [facility] IN [place], [place] IDENTIFICATION/CONFIRMATION Results Called to: [place] AT 4235 05/30/15 DV    Report Status PENDING  Incomplete   Organism ID, Bacteria SALMONELLA SPECIES  Final      Susceptibility   Salmonella species - MIC*    AMPICILLIN <=2 SENSITIVE Sensitive     LEVOFLOXACIN <=0.12 SENSITIVE Sensitive     TRIMETH/SULFA <=20 SENSITIVE Sensitive     * SALMONELLA SPECIES  Blood Culture (routine x 2)     Status: None   Collection Time: 05/27/15  4:16 PM  Result Value Ref Range Status   Specimen Description BLOOD RIGHT HAND  Final   Special Requests BOTTLES DRAWN AEROBIC AND ANAEROBIC  1CC  Final   Culture  Setup Time   Final    GRAM NEGATIVE RODS IN BOTH AEROBIC AND ANAEROBIC BOTTLES CRITICAL RESULT CALLED TO, READ BACK BY AND VERIFIED WITH: [place] VAN DYKE AT 3614 05/28/15.PMH CONFIRMED BY [facility] CRITICAL VALUE NOTED.  VALUE IS CONSISTENT WITH PREVIOUSLY REPORTED AND CALLED VALUE.    Culture   Final    SALMONELLA SPECIES IN BOTH AEROBIC AND ANAEROBIC BOTTLES PREVIOUSLY CALLED REFERRED TO [facility] IN [place], [place] [employer] FOR IDENTIFICATION/CONFIRMATION    Report Status 05/30/2015 FINAL  Final   Organism ID, Bacteria SALMONELLA SPECIES  Final      Susceptibility   Salmonella species - MIC*    AMPICILLIN <=2 SENSITIVE Sensitive     LEVOFLOXACIN <=0.12 SENSITIVE Sensitive     TRIMETH/SULFA <=20 SENSITIVE Sensitive     * SALMONELLA SPECIES  Urine culture     Status: None   Collection Time: 05/27/15  4:53 PM  Result Value Ref Range Status   Specimen Description URINE, RANDOM  Final   Special Requests NONE  Final   Culture >=100,000 COLONIES/mL  ESCHERICHIA COLI  Final   Report Status 05/29/2015 FINAL  Final   Organism ID, Bacteria ESCHERICHIA COLI  Final      Susceptibility   Escherichia coli - MIC*    AMPICILLIN <=2 SENSITIVE Sensitive     CEFTAZIDIME <=1 SENSITIVE Sensitive     CEFAZOLIN <=4 SENSITIVE Sensitive     CEFTRIAXONE <=1 SENSITIVE Sensitive     CIPROFLOXACIN <=0.25 SENSITIVE Sensitive     GENTAMICIN <=1 SENSITIVE Sensitive     IMIPENEM <=0.25 SENSITIVE Sensitive     TRIMETH/SULFA <=20 SENSITIVE Sensitive     NITROFURANTOIN Value in  next row Sensitive      SENSITIVE<=16    PIP/TAZO Value in next row Sensitive      SENSITIVE<=4    * >=100,000 COLONIES/mL ESCHERICHIA COLI  MRSA PCR Screening     Status: None   Collection Time: 05/27/15  7:36 PM  Result Value Ref Range Status   MRSA by PCR NEGATIVE NEGATIVE Final    Comment:        The GeneXpert MRSA Assay (FDA approved for NASAL specimens only), is one component of a comprehensive MRSA colonization surveillance program. It is not intended to diagnose MRSA infection nor to guide or monitor treatment for MRSA infections.   Body fluid culture     Status: None (Preliminary result)   Collection Time: 05/31/15  8:00 AM  Result Value Ref Range Status   Specimen Description FLUID WRIST RIGHT SYNOVIAL  Final   Special Requests Normal  Final   Gram Stain   Final    ABUNDANT WBC PRESENT,BOTH PMN AND MONONUCLEAR NO ORGANISMS SEEN    Culture PENDING  Incomplete   Report Status PENDING  Incomplete    Michel Bickers, MD [facility] [phone] pager   [phone] cell 05/31/2015, 5:51 PM

## 2015-05-31 NOTE — Progress Notes (Signed)
Patient transferred to 2C06 per Dr Tyrell Antonio. Patients son at bedside during transfer. Patient lethargic but A/Ox3. Patients belongings transferred with patient and report was called prior to discharge.

## 2015-06-01 ENCOUNTER — Encounter (HOSPITAL_COMMUNITY): Payer: Self-pay

## 2015-06-01 DIAGNOSIS — D631 Anemia in chronic kidney disease: Secondary | ICD-10-CM

## 2015-06-01 DIAGNOSIS — M00832 Arthritis due to other bacteria, left wrist: Secondary | ICD-10-CM

## 2015-06-01 DIAGNOSIS — R5383 Other fatigue: Secondary | ICD-10-CM

## 2015-06-01 DIAGNOSIS — N189 Chronic kidney disease, unspecified: Secondary | ICD-10-CM

## 2015-06-01 DIAGNOSIS — M00831 Arthritis due to other bacteria, right wrist: Secondary | ICD-10-CM

## 2015-06-01 LAB — BASIC METABOLIC PANEL
Anion gap: 10 (ref 5–15)
BUN: 52 mg/dL — ABNORMAL HIGH (ref 6–20)
CHLORIDE: 107 mmol/L (ref 101–111)
CO2: 25 mmol/L (ref 22–32)
CREATININE: 1.72 mg/dL — AB (ref 0.44–1.00)
Calcium: 8.4 mg/dL — ABNORMAL LOW (ref 8.9–10.3)
GFR, EST AFRICAN AMERICAN: 33 mL/min — AB (ref >60)
GFR, EST NON AFRICAN AMERICAN: 29 mL/min — AB (ref >60)
Glucose, Bld: 141 mg/dL — ABNORMAL HIGH (ref 65–99)
Potassium: 3.9 mmol/L (ref 3.5–5.1)
SODIUM: 142 mmol/L (ref 135–145)

## 2015-06-01 LAB — CBC
HCT: 23.3 % — ABNORMAL LOW (ref 36.0–46.0)
Hemoglobin: 7.3 g/dL — ABNORMAL LOW (ref 12.0–15.0)
MCH: 25.6 pg — ABNORMAL LOW (ref 26.0–34.0)
MCHC: 31.3 g/dL (ref 30.0–36.0)
MCV: 81.8 fL (ref 78.0–100.0)
PLATELETS: 22 10*3/uL — AB (ref 150–400)
RBC: 2.85 MIL/uL — AB (ref 3.87–5.11)
RDW: 20.9 % — ABNORMAL HIGH (ref 11.5–15.5)
WBC: 31.3 10*3/uL — ABNORMAL HIGH (ref 4.0–10.5)

## 2015-06-01 LAB — PREPARE RBC (CROSSMATCH)

## 2015-06-01 MED ORDER — FUROSEMIDE 40 MG PO TABS
40.0000 mg | ORAL_TABLET | Freq: Every day | ORAL | Status: DC
  Administered 2015-06-01 – 2015-06-07 (×7): 40 mg via ORAL
  Filled 2015-06-01 (×7): qty 1

## 2015-06-01 MED ORDER — FUROSEMIDE 10 MG/ML IJ SOLN
20.0000 mg | Freq: Once | INTRAMUSCULAR | Status: AC
  Administered 2015-06-01: 20 mg via INTRAVENOUS
  Filled 2015-06-01: qty 2

## 2015-06-01 MED ORDER — SODIUM CHLORIDE 0.9 % IV SOLN
Freq: Once | INTRAVENOUS | Status: AC
  Administered 2015-06-01: 11:00:00 via INTRAVENOUS

## 2015-06-01 MED ORDER — SULFAMETHOXAZOLE-TRIMETHOPRIM 400-80 MG PO TABS
1.0000 | ORAL_TABLET | Freq: Two times a day (BID) | ORAL | Status: DC
  Administered 2015-06-01 – 2015-06-07 (×13): 1 via ORAL
  Filled 2015-06-01 (×15): qty 1

## 2015-06-01 MED ORDER — NYSTATIN 100000 UNIT/ML MT SUSP
5.0000 mL | Freq: Four times a day (QID) | OROMUCOSAL | Status: DC
  Administered 2015-06-01 – 2015-06-07 (×23): 500000 [IU] via ORAL
  Filled 2015-06-01 (×21): qty 5

## 2015-06-01 NOTE — Clinical Social Work Note (Signed)
Clinical Social Work Assessment  Patient Details  Name: [name] MRN: [id number] Date of Birth: 13-Oct-1941  Date of referral:  06/01/15               Reason for consult:  Facility Placement                Permission sought to share information with:  Family Supports, Chartered certified accountant granted to share information::  Yes, Verbal Permission Granted  Name::     Banker::  [facility]  Relationship::  son  Contact Information:     Housing/Transportation Living arrangements for the past 2 months:  Single Family Home Source of Information:  Patient, Adult Children Patient Interpreter Needed:  None Criminal Activity/Legal Involvement Pertinent to Current Situation/Hospitalization:  No - Comment as needed Significant Relationships:  Adult Children Lives with:  Adult Children Do you feel safe going back to the place where you live?  Yes Need for family participation in patient care:  Yes (Comment)  Care giving concerns:  Pt lives at home with son and daughter in law- states that son and daughter-in-law work during the day and would not have assistance while they are at work   Facilities manager / plan: CSW spoke with pt and pt son concerning PT recommendation for SNF  Employment status:  Retired Forensic scientist:    PT Recommendations:  [place] / Referral to community resources:  [place]  Patient/Family's Response to care:  Pt is agreeable to SNF- has not been before but understands need for assistance.  Son was hopeful that pt could come home with home health but understands recommendation for SNF   Patient/Family's Understanding of and Emotional Response to Diagnosis, Current Treatment, and Prognosis: no questions or concerns at this time  Emotional Assessment Appearance:  Appears stated age Attitude/Demeanor/Rapport:    Affect (typically observed):  Appropriate, Pleasant,  Accepting Orientation:  Oriented to Self, Oriented to Place, Oriented to  Time, Oriented to Situation Alcohol / Substance use:  Not Applicable Psych involvement (Current and /or in the community):  No (Comment)  Discharge Needs  Concerns to be addressed:  Care Coordination Readmission within the last 30 days:  No Current discharge risk:  Physical Impairment Barriers to Discharge:  Continued Medical Work up   Cranford Mon, LCSW 06/01/2015, 12:07 PM

## 2015-06-01 NOTE — Progress Notes (Signed)
Aspiration of right wrist now growing moderate GNR, with no crystals.  It would appear that even if there is some baseline changes at the DRUJ from gout, there is now at least some infectious component.  However, the patient is clinically improved from when she was at her worst.    I discussed her situation with Dr. Michel Bickers of ID, and he agreed that surgically "debulking" the infection would likely be beneficial in his efforts to help eradicate this GNR infection, but indicated that he didn't consider such a procedure to be urgent.  In fact, given the patient's complex and frail medical status, perhaps she will be medically more fit for surgery in a few days.  If her platelets recover, she will even be able to avoid platelet transfusion.  In addition, with her degree of thrombocytopenia, anesthesia would not consider a regional anesthetic so surgery would be more likely to require a general anesthetic and the attendant risks.  Together, we decided to give this patient a few days to allow her to improve and perhaps operatively drain and debride her wrist on Tuesday.  If her platelets are still below 50K, she will require platelet transfusion and either general anesthesia or surgeon-provided local (which could limit the degree of debridement she tolerates).  I have spoken to the patient and the patients daughter and POA, [name], who will discuss these issues with the remainder of the patient's family this weekend.  I will speak with her again on Sunday to see what they wish to do.  The patient is tentatively scheduled for debridement on Tuesday, 8-23 at 0730.  [facility] [phone]

## 2015-06-01 NOTE — Progress Notes (Signed)
Patient ID: [name], female   DOB: 01/27/1941, 74 y.o.   MRN: [id number]         [facility]    Date of Admission:  05/27/2015   Total days of antibiotics 6          Principal Problem:   Salmonella bacteremia Active Problems:   Sepsis   Right wrist deformity   Acute respiratory failure with hypoxemia   Acute respiratory failure   Lethargic   . antiseptic oral rinse  7 mL Mouth Rinse q12n4p  . chlorhexidine  15 mL Mouth Rinse BID  . furosemide  20 mg Intravenous Once  . furosemide  40 mg Oral Daily  . metoprolol tartrate  25 mg Oral BID  . nystatin  5 mL Oral QID  . oxybutynin  5 mg Oral Daily  . pantoprazole  40 mg Oral BID AC  . predniSONE  60 mg Oral Q breakfast  . sulfamethoxazole-trimethoprim  1 tablet Oral Q12H    SUBJECTIVE: She is feeling better but has some left neck pain this morning.  Review of Systems: Pertinent items are noted in HPI.  Past Medical History  Diagnosis Date  . Hypertension   . Hyperlipidemia     Social History  Substance Use Topics  . Smoking status: Never Smoker   . Smokeless tobacco: Not on file  . Alcohol Use: No    No family history on file. Allergies  Allergen Reactions  . Aspirin Rash    OBJECTIVE: Filed Vitals:   06/01/15 1000 06/01/15 1034 06/01/15 1200 06/01/15 1230  BP: 146/73 144/73 152/81 139/73  Pulse: 50 53 59 67  Temp: 98.2 F (36.8 C) 98.7 F (37.1 C)  98.1 F (36.7 C)  TempSrc: Oral Oral  Oral  Resp: 22 17 22 24   Height:      Weight:      SpO2: 98% 96% 99% 99%   Body mass index is 20.54 kg/(m^2).  General: she is alert and in no distress Skin: no rash Neck: Sore but supple Lungs: clear Cor: regular S1 and S2 with no murmur Abdomen: soft and nontender Right wrist swelling persists but is not erythematous or tender  Lab Results Lab Results  Component Value Date   WBC 31.3* 06/01/2015   HGB 7.3* 06/01/2015   HCT 23.3* 06/01/2015   MCV 81.8 06/01/2015   PLT  22* 06/01/2015    Lab Results  Component Value Date   CREATININE 1.72* 06/01/2015   BUN 52* 06/01/2015   NA 142 06/01/2015   K 3.9 06/01/2015   CL 107 06/01/2015   CO2 25 06/01/2015    Lab Results  Component Value Date   ALT 21 05/29/2015   AST 27 05/29/2015   ALKPHOS 70 05/29/2015   BILITOT 1.2 05/29/2015     Microbiology: Recent Results (from the past 240 hour(s))  Blood Culture (routine x 2)     Status: None (Preliminary result)   Collection Time: 05/27/15  4:16 PM  Result Value Ref Range Status   Specimen Description BLOOD RIGHT FOREARM  Final   Special Requests   Final    BOTTLES DRAWN AEROBIC AND ANAEROBIC  AER 5CC ANA 3CC   Culture  Setup Time   Final    GRAM NEGATIVE RODS IN BOTH AEROBIC AND ANAEROBIC BOTTLES CRITICAL RESULT CALLED TO, READ BACK BY AND VERIFIED WITH: Mali VAN DYKE AT 6546 05/28/15.PMH CONFIRMED BY [facility]    Culture   Final  SALMONELLA SPECIES IN BOTH AEROBIC AND ANAEROBIC BOTTLES REFERRED TO [facility] IN [place], [place] FOR IDENTIFICATION/CONFIRMATION Results Called to: [place] AT 9983 05/30/15 DV    Report Status PENDING  Incomplete   Organism ID, Bacteria SALMONELLA SPECIES  Final      Susceptibility   Salmonella species - MIC*    AMPICILLIN <=2 SENSITIVE Sensitive     LEVOFLOXACIN <=0.12 SENSITIVE Sensitive     TRIMETH/SULFA <=20 SENSITIVE Sensitive     * SALMONELLA SPECIES  Blood Culture (routine x 2)     Status: None   Collection Time: 05/27/15  4:16 PM  Result Value Ref Range Status   Specimen Description BLOOD RIGHT HAND  Final   Special Requests BOTTLES DRAWN AEROBIC AND ANAEROBIC  1CC  Final   Culture  Setup Time   Final    GRAM NEGATIVE RODS IN BOTH AEROBIC AND ANAEROBIC BOTTLES CRITICAL RESULT CALLED TO, READ BACK BY AND VERIFIED WITH: Mali VAN DYKE AT 3825 05/28/15.PMH CONFIRMED BY [facility] CRITICAL VALUE NOTED.  VALUE IS CONSISTENT WITH PREVIOUSLY REPORTED AND CALLED VALUE.    Culture   Final      SALMONELLA SPECIES IN BOTH AEROBIC AND ANAEROBIC BOTTLES PREVIOUSLY CALLED REFERRED TO [facility] IN [place], [place] FOR IDENTIFICATION/CONFIRMATION    Report Status 05/30/2015 FINAL  Final   Organism ID, Bacteria SALMONELLA SPECIES  Final      Susceptibility   Salmonella species - MIC*    AMPICILLIN <=2 SENSITIVE Sensitive     LEVOFLOXACIN <=0.12 SENSITIVE Sensitive     TRIMETH/SULFA <=20 SENSITIVE Sensitive     * SALMONELLA SPECIES  Urine culture     Status: None   Collection Time: 05/27/15  4:53 PM  Result Value Ref Range Status   Specimen Description URINE, RANDOM  Final   Special Requests NONE  Final   Culture >=100,000 COLONIES/mL ESCHERICHIA COLI  Final   Report Status 05/29/2015 FINAL  Final   Organism ID, Bacteria ESCHERICHIA COLI  Final      Susceptibility   Escherichia coli - MIC*    AMPICILLIN <=2 SENSITIVE Sensitive     CEFTAZIDIME <=1 SENSITIVE Sensitive     CEFAZOLIN <=4 SENSITIVE Sensitive     CEFTRIAXONE <=1 SENSITIVE Sensitive     CIPROFLOXACIN <=0.25 SENSITIVE Sensitive     GENTAMICIN <=1 SENSITIVE Sensitive     IMIPENEM <=0.25 SENSITIVE Sensitive     TRIMETH/SULFA <=20 SENSITIVE Sensitive     NITROFURANTOIN Value in next row Sensitive      SENSITIVE<=16    PIP/TAZO Value in next row Sensitive      SENSITIVE<=4    * >=100,000 COLONIES/mL ESCHERICHIA COLI  MRSA PCR Screening     Status: None   Collection Time: 05/27/15  7:36 PM  Result Value Ref Range Status   MRSA by PCR NEGATIVE NEGATIVE Final    Comment:        The GeneXpert MRSA Assay (FDA approved for NASAL specimens only), is one component of a comprehensive MRSA colonization surveillance program. It is not intended to diagnose MRSA infection nor to guide or monitor treatment for MRSA infections.   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 I will change her to renally dosed oral trimethoprim sulfamethoxazole and plan on 6 weeks of total therapy.  PLAN: 1. Trimethoprim sulfamethoxazole one single strength tablet twice a day for 5 more weeks 2. I will arrange followup in my clinic in one month 3. I will sign off now  Michel Bickers, MD [facility] [phone] pager   [phone] cell 06/01/2015, 12:35 PM

## 2015-06-01 NOTE — Clinical Social Work Placement (Signed)
   CLINICAL SOCIAL WORK PLACEMENT  NOTE  Date:  06/01/2015  Patient Details  Name: [name] MRN: [id number] Date of Birth: January 21, 1941  Clinical Social Work is seeking post-discharge placement for this patient at the [facility] level of care (*CSW will initial, date and re-position this form in  chart as items are completed):  Yes   Patient/family provided with [facility] Work Department's list of facilities offering this level of care within the geographic area requested by the patient (or if unable, by the patient's family).  Yes   Patient/family informed of their freedom to choose among providers that offer the needed level of care, that participate in Medicare, Medicaid or managed care program needed by the patient, have an available bed and are willing to accept the patient.  Yes   Patient/family informed of [facility]'s ownership interest in [facility] and [facility], as well as of the fact that they are under no obligation to receive care at these facilities.  PASRR submitted to EDS on 06/01/15     PASRR number received on 06/01/15     Existing PASRR number confirmed on       FL2 transmitted to all facilities in geographic area requested by pt/family on 06/01/15     FL2 transmitted to all facilities within larger geographic area on       Patient informed that his/her managed care company has contracts with or will negotiate with certain facilities, including the following:            Patient/family informed of bed offers received.  Patient chooses bed at       Physician recommends and patient chooses bed at      Patient to be transferred to   on  .  Patient to be transferred to facility by       Patient family notified on   of transfer.  Name of family member notified:        PHYSICIAN Please sign FL2     Additional Comment:    _______________________________________________ Cranford Mon, LCSW 06/01/2015, 2:12  PM

## 2015-06-01 NOTE — Progress Notes (Signed)
Chaplain responded to spiritual care consult for advanced directive. Pt requested information to look over. Chaplain went over advanced directive packet with pt. Chaplain requested that if pt has more questions or is ready to sign to ask nurse to page chaplain. Chaplain offered prayer; gesture appreciated. Page chaplain as needed. [phone]   06/01/15 0900  Clinical Encounter Type  Visited With Patient  Visit Type Initial;Spiritual support  Referral From Nurse  Spiritual Encounters  Spiritual Needs Emotional;Prayer  Stress Factors  Patient Stress Factors Health changes  Katyra Tomassetti, Barbette Hair, Chaplain 06/01/2015 9:49 AM

## 2015-06-01 NOTE — Progress Notes (Signed)
[facility] PROGRESS NOTE  [name] PQA:[id number] DOB: 03-14-1941 DOA: 05/27/2015 PCP: Leotis Shames, MD  Assessment/Plan: 1-Sepsis due to E. Coli UTI, salmonella Bacteremia.  -Afebrile.  -will continue rocephin -will continue supportive care and follow clinical response -PRN antipyretics   2-Acute hypoxemic resp failure: with ?? Pulmonary edema -patient initially required BIPAP -now breathing easier and maintaining O2 sat on [place] -will continue lasix PO now and follow volume status, increase lasix to 40 mg.  -will continue flutter valve -continue PRN nebulizer and will start pulmicort Weight :  56--60--61.55--56  3-Acute on chronic diastolic heart failure -neg troponin and no CP -will follow daily weights, strict intake and output and continue diuretics  4-Acute on chronic renal failure: stage 3 at baseline with Cr at 1. -will continue tx for UTI -on PO lasix now, home dose -will monitor Cr trend -cr increased to 1.7 today. Peak at 1.9 during this admission.  -monitor on lasix,   5-Toxic metabolic encephalopathy: due to UTI, sepsis.  -will monitor and continue constant re-orientation -check CBG.  -Ct head negative.  -continue to monitor in the step down unit. She wake up, follows commands.   6-Right wrist deformity and pain: infection.  -orthopedic service consulted -MRI; erosive arthropathy radioulnar joint. Differential RA, Gout. S/P fluid aspiration.  -synovial fluid cell count 22,000.  -culture no growth to date.   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 Received one dose  IV solumedrol.  -Appreciate Dr Alen Blew. Continue with prednisone 60 mg daily.   8-[place] Bacteremia:  ID consulted.  Continue with ceftriaxone.   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  -in setting of infection. Monitor.   11-Anemia: chronic disease. Will transfuse 2 units PRBC. Lasix in between.   lactic acid: resolved.  HTN: stable -will continue metoprolol and lasix  Code Status: Full Family Communication: patient  Disposition Plan: to be determine; but most likely will require SNF for rehab; family ok with it if needed   Consultants:  PCCM  Procedures:  2-D echo - Left ventricle: The cavity size was normal. Wall thickness was normal. Systolic function was normal. The estimated ejection fraction was in the range of 60% to 65%. Wall motion was normal; there were no regional wall motion abnormalities. Doppler parameters are consistent with abnormal left ventricular relaxation (grade 1 diastolic dysfunction). - Aortic valve: There was no stenosis. - Mitral valve: There was trivial regurgitation. - Left atrium: The atrium was moderately dilated. - Right ventricle: The cavity size was normal. Systolic function was normal. - Tricuspid valve: Peak RV-RA gradient (S): 40 mm Hg. - Pulmonary arteries: PA peak pressure: 43 mm Hg (S). - Inferior vena cava: The vessel was normal in size. The respirophasic diameter changes were in the normal range (>= 50%), consistent with normal central venous pressure.  Impressions: - Normal LV size with EF 60-65%. Normal RV size and systolic function. Moderate LAE. No significant valvular abnormalities. Mild pulmonary hypertension.  Antibiotics: Vanc 8/14 >>8/14 Zosyn 8/14 >> 8/14 Rocephin 8/14 >>   HPI/Subjective: She wake up, answer questions. Today remain more alert during conversation. Feeling weak and tired.  Denies pain. Wrist pain better.   Objective: Filed Vitals:   06/01/15 0830  BP: 137/67  Pulse: 71  Temp: 98.2 F (36.8 C)  Resp: 19    Intake/Output Summary (  Last 24 hours) at 06/01/15 0843 Last data filed at 05/31/15 1700  Gross per 24 hour  Intake     360 ml  Output      0 ml  Net    360 ml   Filed Weights   05/30/15 1947 05/31/15 1900 06/01/15 0500  Weight: 55.792 kg (123 lb) 55.43 kg (122 lb 3.2 oz) 56 kg (123 lb 7.3 oz)    Exam:   General: More alert , answer questions.   Cardiovascular: S1 and S2, no rubs or gallops  Respiratory: scattered rhonchi, no frank crackles; positive mild exp wheezing   Abdomen: soft, NT, ND, positive BS  Musculoskeletal: right hand with wrist deformity and decreased  pain to touch; no erythema seen,   Data Reviewed: Basic Metabolic Panel:  Recent Labs Lab 05/28/15 0707 05/29/15 0536 05/30/15 0555 05/31/15 0605 06/01/15 0227  NA 140 144 142 147* 142  K 3.9 3.4* 3.2* 3.9 3.9  CL 110 111 109 114* 107  CO2 24 22 23 27 25   GLUCOSE 96 128* 124* 116* 141*  BUN 65* 65* 65* 56* 52*  CREATININE 1.93* 1.98* 1.69* 1.68* 1.72*  CALCIUM 8.7* 8.7* 8.4* 8.6* 8.4*  MG 1.8  --   --   --   --   PHOS 2.2*  --   --   --   --    Liver Function Tests:  Recent Labs Lab 05/27/15 1616 05/29/15 0536  AST 36 27  ALT 18 21  ALKPHOS 59 70  BILITOT 1.9* 1.2  PROT 7.4 7.5  ALBUMIN 2.7* 2.5*   CBC:  Recent Labs Lab 05/28/15 0707 05/29/15 0536 05/30/15 0555 05/31/15 0605 06/01/15 0227  WBC 36.2* 34.6* 28.0* 31.7* 31.3*  NEUTROABS  --  27.3*  --   --   --   HGB 8.1* 8.5* 8.2* 8.1* 7.3*  HCT 25.4* 26.3* 25.6* 25.3* 23.3*  MCV 78.2 77.8* 78.5 79.8 81.8  PLT 21* 23* 27* 8* 22*   Cardiac Enzymes:  Recent Labs Lab 05/27/15 1616 05/28/15 0226 05/28/15 0707 05/28/15 1515  TROPONINI 0.03 0.04* 0.03 0.03   BNP (last 3 results)  Recent Labs  05/28/15 0226  BNP 515.4*   CBG:  Recent Labs Lab 05/27/15 1923 05/28/15 0835 05/28/15 1137 05/31/15 1157  GLUCAP 114* 95 92 99    Recent Results (from the past 240 hour(s))  Blood Culture (routine x 2)     Status: None (Preliminary result)   Collection Time: 05/27/15  4:16 PM  Result Value Ref Range Status   Specimen Description BLOOD  RIGHT FOREARM  Final   Special Requests   Final    BOTTLES DRAWN AEROBIC AND ANAEROBIC  AER 5CC ANA 3CC   Culture  Setup Time   Final    GRAM NEGATIVE RODS IN BOTH AEROBIC AND ANAEROBIC BOTTLES CRITICAL RESULT CALLED TO, READ BACK BY AND VERIFIED WITH: Mali VAN DYKE AT 1761 05/28/15.PMH CONFIRMED BY [facility]    Culture   Final    SALMONELLA SPECIES IN BOTH AEROBIC AND ANAEROBIC BOTTLES REFERRED TO [facility] IN [place] IDENTIFICATION/CONFIRMATION Results Called to: [place] AT 6073 05/30/15 DV    Report Status PENDING  Incomplete   Organism ID, Bacteria SALMONELLA SPECIES  Final      Susceptibility   Salmonella species - MIC*    AMPICILLIN <=2 SENSITIVE Sensitive     LEVOFLOXACIN <=0.12 SENSITIVE Sensitive     TRIMETH/SULFA <=20 SENSITIVE Sensitive     *  SALMONELLA SPECIES  Blood Culture (routine x 2)     Status: None   Collection Time: 05/27/15  4:16 PM  Result Value Ref Range Status   Specimen Description BLOOD RIGHT HAND  Final   Special Requests BOTTLES DRAWN AEROBIC AND ANAEROBIC  1CC  Final   Culture  Setup Time   Final    GRAM NEGATIVE RODS IN BOTH AEROBIC AND ANAEROBIC BOTTLES CRITICAL RESULT CALLED TO, READ BACK BY AND VERIFIED WITH: Mali VAN DYKE AT 6063 05/28/15.PMH CONFIRMED BY [facility] CRITICAL VALUE NOTED.  VALUE IS CONSISTENT WITH PREVIOUSLY REPORTED AND CALLED VALUE.    Culture   Final    SALMONELLA SPECIES IN BOTH AEROBIC AND ANAEROBIC BOTTLES PREVIOUSLY CALLED REFERRED TO [facility] IN [place], [place] FOR IDENTIFICATION/CONFIRMATION    Report Status 05/30/2015 FINAL  Final   Organism ID, Bacteria SALMONELLA SPECIES  Final      Susceptibility   Salmonella species - MIC*    AMPICILLIN <=2 SENSITIVE Sensitive     LEVOFLOXACIN <=0.12 SENSITIVE Sensitive     TRIMETH/SULFA <=20 SENSITIVE Sensitive     * SALMONELLA SPECIES  Urine culture     Status: None   Collection Time: 05/27/15  4:53 PM   Result Value Ref Range Status   Specimen Description URINE, RANDOM  Final   Special Requests NONE  Final   Culture >=100,000 COLONIES/mL ESCHERICHIA COLI  Final   Report Status 05/29/2015 FINAL  Final   Organism ID, Bacteria ESCHERICHIA COLI  Final      Susceptibility   Escherichia coli - MIC*    AMPICILLIN <=2 SENSITIVE Sensitive     CEFTAZIDIME <=1 SENSITIVE Sensitive     CEFAZOLIN <=4 SENSITIVE Sensitive     CEFTRIAXONE <=1 SENSITIVE Sensitive     CIPROFLOXACIN <=0.25 SENSITIVE Sensitive     GENTAMICIN <=1 SENSITIVE Sensitive     IMIPENEM <=0.25 SENSITIVE Sensitive     TRIMETH/SULFA <=20 SENSITIVE Sensitive     NITROFURANTOIN Value in next row Sensitive      SENSITIVE<=16    PIP/TAZO Value in next row Sensitive      SENSITIVE<=4    * >=100,000 COLONIES/mL ESCHERICHIA COLI  MRSA PCR Screening     Status: None   Collection Time: 05/27/15  7:36 PM  Result Value Ref Range Status   MRSA by PCR NEGATIVE NEGATIVE Final    Comment:        The GeneXpert MRSA Assay (FDA approved for NASAL specimens only), is one component of a comprehensive MRSA colonization surveillance program. It is not intended to diagnose MRSA infection nor to guide or monitor treatment for MRSA infections.   Body fluid culture     Status: None (Preliminary result)   Collection Time: 05/31/15  8:00 AM  Result Value Ref Range Status   Specimen Description FLUID WRIST RIGHT SYNOVIAL  Final   Special Requests Normal  Final   Gram Stain   Final    ABUNDANT WBC PRESENT,BOTH PMN AND MONONUCLEAR NO ORGANISMS SEEN    Culture PENDING  Incomplete   Report Status PENDING  Incomplete     Studies: Ct Head Wo Contrast  05/31/2015   CLINICAL DATA:  Lethargy.  Occipital headache today.  EXAM: CT HEAD WITHOUT CONTRAST  TECHNIQUE: Contiguous axial images were obtained from the base of the skull through the vertex without intravenous contrast.  COMPARISON:  None.  FINDINGS: The brainstem, cerebellum, cerebral  peduncles, thalamus, basal ganglia, basilar cisterns, and ventricular system appear within normal  limits. Periventricular white matter and corona radiata hypodensities favor chronic ischemic microvascular white matter disease. No intracranial hemorrhage, mass lesion, or acute CVA.  There is opacification of a single right-sided ethmoid air cell.  IMPRESSION: 1. No acute intracranial findings. 2. Periventricular white matter and corona radiata hypodensities favor chronic ischemic microvascular white matter disease. 3. Minimal chronic ethmoid sinusitis.   Electronically Signed   By: Van Clines M.D.   On: 05/31/2015 12:43   Mr Wrist Right Wo Contrast  05/30/2015   CLINICAL DATA:  Right wrist deformity and pain.  EXAM: MR OF THE RIGHT WRIST WITHOUT CONTRAST  TECHNIQUE: Multiplanar, multisequence MR imaging of the right wrist was performed. No intravenous contrast was administered.  COMPARISON:  05/27/2015  FINDINGS: Despite efforts by the technologist and patient, motion artifact is present on today's exam and could not be eliminated. This reduces exam sensitivity and specificity. The patient had difficulty tolerating imaging.  Ligaments: Greater than expected fluid signal along the proximal scapholunate interval, but quite likely due to erosion rather than definite scapholunate ligament tear. No scapholunate separation is identified.  Triangular fibrocartilage: TFCC disc not well seen, suspected chronic tear.  Tendons: Prominent fluid distention of the tendon sheaths of extensor digitorum compartment (extensor compartment 4) at the level of the wrist, in continuity with the eroded effusion of the distal radioulnar joint, with associated filling defects compatible with synovitis. Possibly torn or deficient extensor digitorum tendons of the third and fourth fingers, which seem to reconstitute along the distal margin of the cystic collection.  Carpal tunnel/median nerve: Grossly unremarkable.  Guyon's canal:  Unremarkable  Joint/cartilage: Prominent erosions along the distal radioulnar joint with contiguity of the dry to with the fourth extensor compartment in the radiocarpal joint.  Bones/carpal alignment: In addition to the large erosions of the distal radial ulnar joint, there are degenerative appearing findings with spurring and loss of articular space at the first and second carpometacarpal articulations.  Other: No supplemental non-categorized findings.  IMPRESSION: 1. 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 Portions of the extensor digitorum tendons, including those extending to the third and fourth fingers, may be torn or partially torn. Differential diagnostic considerations favor rheumatoid arthritis, gout, or similar erosive arthropathy. Infection unlikely but not entirely excluded. It should be straightforward to sample the dorsal fluid collection with a needle, the large fluid collection is only 4 mm below the skin surface. 2. Degenerative findings at the first and second carpometacarpal articulations.   Electronically Signed   By: Van Clines M.D.   On: 05/30/2015 14:29    Scheduled Meds: . sodium chloride   Intravenous Once  . antiseptic oral rinse  7 mL Mouth Rinse q12n4p  . cefTRIAXone (ROCEPHIN)  IV  2 g Intravenous Q24H  . chlorhexidine  15 mL Mouth Rinse BID  . furosemide  20 mg Intravenous Once  . furosemide  40 mg Oral Daily  . metoprolol tartrate  25 mg Oral BID  . nystatin  5 mL Oral QID  . oxybutynin  5 mg Oral Daily  . pantoprazole  40 mg Oral BID AC  . predniSONE  60 mg Oral Q breakfast   Continuous Infusions:   Principal Problem:   Salmonella bacteremia Active Problems:   Sepsis   Acute respiratory failure with hypoxemia   Acute respiratory  failure  Right wrist deformity    Time spent: 35 minutes    Rayhana Slider, [facility] Pager [phone]. If 7PM-7AM, please contact night-coverage at [email or web address], password [facility] 06/01/2015, 8:43 AM  LOS: 5 days

## 2015-06-01 NOTE — Progress Notes (Signed)
CRITICAL VALUE ALERT  Critical value received:  Gram negative rods from right synovial fluid.   Date of notification:  06/01/15  Time of notification:  1121  Critical value read back:Yes.    Nurse who received alert:  Josefine Fuhr, RN  MD notified (1st page):  Dr. Tyrell Antonio   Time of first page: 1125  Responding MD:  Dr. Tyrell Antonio

## 2015-06-01 NOTE — Progress Notes (Signed)
IP PROGRESS NOTE  Subjective:   Events noted in the last 24 hours patient more lethargic this morning. She opens her eyes and follow simple commands. She is oriented to person and place. No bleeding noted.  Objective:  Vital signs in last 24 hours: Temp:  [97.9 F (36.6 C)-98.4 F (36.9 C)] 98.1 F (36.7 C) (08/19 0400) Pulse Rate:  [90-96] 90 (08/18 1737) Resp:  [20] 20 (08/18 1737) BP: (145-170)/(80-88) 151/88 mmHg (08/18 2230) SpO2:  [98 %] 98 % (08/18 1737) Weight:  [122 lb 3.2 oz (55.43 kg)-123 lb 7.3 oz (56 kg)] 123 lb 7.3 oz (56 kg) (08/19 0500) Weight change: -12.8 oz (-0.363 kg) Last BM Date: 05/30/15  Intake/Output from previous day: 08/18 0701 - 08/19 0700 In: 360 [P.O.:360] Out: -   Mouth: mucous membranes moist, pharynx normal without lesions Resp: clear to auscultation bilaterally Cardio: regular rate and rhythm, S1, S2 normal, no murmur, click, rub or gallop GI: soft, non-tender; bowel sounds normal; no masses,  no organomegaly Extremities: extremities normal, atraumatic, no cyanosis or edema  Portacath/PICC-without erythema  Lab Results:  Recent Labs  05/31/15 0605 06/01/15 0227  WBC 31.7* 31.3*  HGB 8.1* 7.3*  HCT 25.3* 23.3*  PLT 8* 22*    BMET  Recent Labs  05/31/15 0605 06/01/15 0227  NA 147* 142  K 3.9 3.9  CL 114* 107  CO2 27 25  GLUCOSE 116* 141*  BUN 56* 52*  CREATININE 1.68* 1.72*  CALCIUM 8.6* 8.4*    Studies/Results: Ct Head Wo Contrast  05/31/2015   CLINICAL DATA:  Lethargy.  Occipital headache today.  EXAM: CT HEAD WITHOUT CONTRAST  TECHNIQUE: Contiguous axial images were obtained from the base of the skull through the vertex without intravenous contrast.  COMPARISON:  None.  FINDINGS: The brainstem, cerebellum, cerebral peduncles, thalamus, basal ganglia, basilar cisterns, and ventricular system appear within normal limits. Periventricular white matter and corona radiata hypodensities favor chronic ischemic microvascular  white matter disease. No intracranial hemorrhage, mass lesion, or acute CVA.  There is opacification of a single right-sided ethmoid air cell.  IMPRESSION: 1. No acute intracranial findings. 2. Periventricular white matter and corona radiata hypodensities favor chronic ischemic microvascular white matter disease. 3. Minimal chronic ethmoid sinusitis.   Electronically Signed   By: Van Clines M.D.   On: 05/31/2015 12:43    Assessment/Plan:  74 year old woman with the following issues:  1. 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 After 1 day of steroids, her platelet count is up to 22. She has no active bleeding at this time without any evidence of petechiae or ecchymosis.  I have recommended continuing prednisone at 60 mg daily until her platelet count normalizes. She will likely be discharged on that dose. If oral intake becomes an issue, this can be switched to 40 mg of IV methylprednisolone.  I would not recommend platelet transfusion unless bleeding is noted.  If sepsis becomes an issue and steroids are absolutely contraindicated, then IVIG would be a temporary resolution to her platelet issues.  2. 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 This is a chronic condition and likely her white cell count is aggravated by recent infection. No intervention is needed and I do not see sign of acute leukemia.  3. Anemia: Multifactorial in nature. She has element of iron deficiency and have had iron supplements in the past. She also has anemia of renal disease and has been on  growth factor support.  From a management standpoint, red cell transfusions can be used while she is in the hospital to keep her hemoglobin above 7.  4. Altered mental status: Possibly related to sepsis. His CT scan did not show any evidence of intracranial bleed.  Please call with questions over the weekend. I will see her back on  06/04/2015.     LOS: 5 days   Eligio Angert 06/01/2015, 8:11 AM

## 2015-06-02 LAB — CBC WITH DIFFERENTIAL/PLATELET
BASOS ABS: 0 10*3/uL (ref 0.0–0.1)
Basophils Relative: 0 % (ref 0–1)
EOS ABS: 0.3 10*3/uL (ref 0.0–0.7)
Eosinophils Relative: 1 % (ref 0–5)
HCT: 33.1 % — ABNORMAL LOW (ref 36.0–46.0)
HEMOGLOBIN: 11.1 g/dL — AB (ref 12.0–15.0)
LYMPHS ABS: 1 10*3/uL (ref 0.7–4.0)
LYMPHS PCT: 4 % — AB (ref 12–46)
MCH: 26.6 pg (ref 26.0–34.0)
MCHC: 33.5 g/dL (ref 30.0–36.0)
MCV: 79.2 fL (ref 78.0–100.0)
Monocytes Absolute: 2.8 10*3/uL — ABNORMAL HIGH (ref 0.1–1.0)
Monocytes Relative: 11 % (ref 3–12)
NEUTROS ABS: 20.9 10*3/uL — AB (ref 1.7–7.7)
Neutrophils Relative %: 84 % — ABNORMAL HIGH (ref 43–77)
Platelets: 31 10*3/uL — ABNORMAL LOW (ref 150–400)
RBC: 4.18 MIL/uL (ref 3.87–5.11)
RDW: 19.9 % — AB (ref 11.5–15.5)
WBC: 25 10*3/uL — ABNORMAL HIGH (ref 4.0–10.5)

## 2015-06-02 LAB — TYPE AND SCREEN
ABO/RH(D): O POS
ANTIBODY SCREEN: NEGATIVE
UNIT DIVISION: 0
UNIT DIVISION: 0

## 2015-06-02 LAB — BASIC METABOLIC PANEL
ANION GAP: 13 (ref 5–15)
BUN: 60 mg/dL — AB (ref 6–20)
CO2: 21 mmol/L — ABNORMAL LOW (ref 22–32)
Calcium: 8.5 mg/dL — ABNORMAL LOW (ref 8.9–10.3)
Chloride: 103 mmol/L (ref 101–111)
Creatinine, Ser: 1.65 mg/dL — ABNORMAL HIGH (ref 0.44–1.00)
GFR, EST AFRICAN AMERICAN: 35 mL/min — AB (ref >60)
GFR, EST NON AFRICAN AMERICAN: 30 mL/min — AB (ref >60)
Glucose, Bld: 103 mg/dL — ABNORMAL HIGH (ref 65–99)
POTASSIUM: 4.1 mmol/L (ref 3.5–5.1)
SODIUM: 137 mmol/L (ref 135–145)

## 2015-06-02 LAB — CBC
HCT: 33.3 % — ABNORMAL LOW (ref 36.0–46.0)
Hemoglobin: 11.1 g/dL — ABNORMAL LOW (ref 12.0–15.0)
MCH: 26.7 pg (ref 26.0–34.0)
MCHC: 33.3 g/dL (ref 30.0–36.0)
MCV: 80.2 fL (ref 78.0–100.0)
PLATELETS: 14 10*3/uL — AB (ref 150–400)
RBC: 4.15 MIL/uL (ref 3.87–5.11)
RDW: 19.5 % — ABNORMAL HIGH (ref 11.5–15.5)
WBC: 31 10*3/uL — AB (ref 4.0–10.5)

## 2015-06-02 LAB — URIC ACID: URIC ACID, SERUM: 12.1 mg/dL — AB (ref 2.3–6.6)

## 2015-06-02 MED ORDER — LIDOCAINE 5 % EX PTCH
1.0000 | MEDICATED_PATCH | CUTANEOUS | Status: DC
  Administered 2015-06-02 – 2015-06-07 (×6): 1 via TRANSDERMAL
  Filled 2015-06-02 (×6): qty 1

## 2015-06-02 MED ORDER — PREDNISONE 20 MG PO TABS
20.0000 mg | ORAL_TABLET | Freq: Once | ORAL | Status: AC
  Administered 2015-06-02: 20 mg via ORAL
  Filled 2015-06-02: qty 1

## 2015-06-02 MED ORDER — PREDNISONE 20 MG PO TABS
80.0000 mg | ORAL_TABLET | Freq: Every day | ORAL | Status: DC
  Administered 2015-06-03: 80 mg via ORAL
  Filled 2015-06-02 (×4): qty 1

## 2015-06-02 MED ORDER — FOLIC ACID 1 MG PO TABS
1.0000 mg | ORAL_TABLET | Freq: Every day | ORAL | Status: DC
  Administered 2015-06-02 – 2015-06-07 (×6): 1 mg via ORAL
  Filled 2015-06-02 (×6): qty 1

## 2015-06-02 MED ORDER — HYDRALAZINE HCL 20 MG/ML IJ SOLN
10.0000 mg | Freq: Three times a day (TID) | INTRAMUSCULAR | Status: DC | PRN
  Administered 2015-06-03 – 2015-06-06 (×3): 10 mg via INTRAVENOUS
  Filled 2015-06-02 (×3): qty 1

## 2015-06-02 NOTE — Progress Notes (Signed)
[facility] PROGRESS NOTE  [name] GEX:[id number] DOB: [birth year] DOA: 05/27/2015 PCP: Leotis Shames, MD  Assessment/Plan: 1-Sepsis due to E. Coli UTI, salmonella Bacteremia.  -Afebrile.  -will continue supportive care and follow clinical response -PRN antipyretics  -on oral Bactrim.   2-Acute hypoxemic resp failure: with ?? Pulmonary edema -patient initially required BIPAP -now breathing easier and maintaining O2 sat on [place] -will continue lasix PO now and follow volume status, increase lasix to 40 mg.  -will continue flutter valve -continue PRN nebulizer and will start pulmicort Weight :  56--60--61.55--56  3-Acute on chronic diastolic heart failure -neg troponin and no CP -will follow daily weights, strict intake and output and continue diuretics -continue with lasix.   4-Acute on chronic renal failure: stage 3 at baseline with Cr at 1. -will continue tx for UTI -on PO lasix now, home dose -will monitor Cr trend -cr  At 1.6.  Peak at 1.9 during this admission.  -monitor on lasix,   5-Toxic metabolic encephalopathy: due to UTI, sepsis.  -will monitor and continue constant re-orientation -check CBG.  -Ct head negative.  -improved.   6-Right wrist deformity and pain: infection.  -orthopedic service consulted -MRI; erosive arthropathy radioulnar joint. Differential RA, Gout. S/P fluid aspiration.  -synovial fluid cell count 22,000.  -culture no growth to date.  -plan for surgery on Tuesday if medical stable.   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 Received one dose  IV solumedrol.  -Appreciate Dr Alen Blew. Continue with prednisone 60 mg daily.  Discussed with oncologist on call. Will give extra dose 20 mg of prednisone. Repeat cbc in am.   8-Salmonela Bacteremia:  ID consulted.  Antibiotics change to Bactrim.   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  -in setting of infection. Monitor.   11-Anemia: chronic disease. Received 2 units PRBC 8-19  lactic acid: resolved.  HTN: stable -will continue metoprolol and lasix  Code Status: Full Family Communication: patient  Disposition Plan: to be determine; but most likely will require SNF for rehab; family ok with it if needed   Consultants:  PCCM  Procedures:  2-D echo - Left ventricle: The cavity size was normal. Wall thickness was normal. Systolic function was normal. The estimated ejection fraction was in the range of 60% to 65%. Wall motion was normal; there were no regional wall motion abnormalities. Doppler parameters are consistent with abnormal left ventricular relaxation (grade 1 diastolic dysfunction). - Aortic valve: There was no stenosis. - Mitral valve: There was trivial regurgitation. - Left atrium: The atrium was moderately dilated. - Right ventricle: The cavity size was normal. Systolic function was normal. - Tricuspid valve: Peak RV-RA gradient (S): 40 mm Hg. - Pulmonary arteries: PA peak pressure: 43 mm Hg (S). - Inferior vena cava: The vessel was normal in size. The respirophasic diameter changes were in the normal range (>= 50%), consistent with normal central venous pressure.  Impressions: - Normal LV size with EF 60-65%. Normal RV size and systolic function. Moderate LAE. No significant valvular abnormalities. Mild pulmonary hypertension.  Antibiotics: Vanc 8/14 >>8/14 Zosyn 8/14 >> 8/14 Rocephin 8/14 >>   HPI/Subjective: She is more alert today. Complaining of shoulder pain.    Objective: Filed Vitals:   06/02/15 1144  BP: 169/87  Pulse: 59  Temp:   Resp: 26  Intake/Output Summary (Last 24 hours) at 06/02/15 1406 Last data filed at 06/02/15 1027  Gross per 24 hour  Intake    610 ml  Output       0 ml  Net    610 ml   Filed Weights   05/30/15 1947 05/31/15 1900 06/01/15 0500  Weight: 55.792 kg (123 lb) 55.43 kg (122 lb 3.2 oz) 56 kg (123 lb 7.3 oz)    Exam:   General:  alert , answer questions.   Cardiovascular: S1 and S2, no rubs or gallops  Respiratory: scattered rhonchi, no frank crackles; positive mild exp wheezing   Abdomen: soft, NT, ND, positive BS  Musculoskeletal: right hand with wrist deformity and decreased  pain to touch; no erythema seen,   Data Reviewed: Basic Metabolic Panel:  Recent Labs Lab 05/28/15 0707 05/29/15 0536 05/30/15 0555 05/31/15 0605 06/01/15 0227 06/02/15 1009  NA 140 144 142 147* 142 137  K 3.9 3.4* 3.2* 3.9 3.9 4.1  CL 110 111 109 114* 107 103  CO2 24 22 23 27 25  21*  GLUCOSE 96 128* 124* 116* 141* 103*  BUN 65* 65* 65* 56* 52* 60*  CREATININE 1.93* 1.98* 1.69* 1.68* 1.72* 1.65*  CALCIUM 8.7* 8.7* 8.4* 8.6* 8.4* 8.5*  MG 1.8  --   --   --   --   --   PHOS 2.2*  --   --   --   --   --    Liver Function Tests:  Recent Labs Lab 05/27/15 1616 05/29/15 0536  AST 36 27  ALT 18 21  ALKPHOS 59 70  BILITOT 1.9* 1.2  PROT 7.4 7.5  ALBUMIN 2.7* 2.5*   CBC:  Recent Labs Lab 05/29/15 0536 05/30/15 0555 05/31/15 0605 06/01/15 0227 06/02/15 1009  WBC 34.6* 28.0* 31.7* 31.3* 31.0*  NEUTROABS 27.3*  --   --   --   --   HGB 8.5* 8.2* 8.1* 7.3* 11.1*  HCT 26.3* 25.6* 25.3* 23.3* 33.3*  MCV 77.8* 78.5 79.8 81.8 80.2  PLT 23* 27* 8* 22* 14*   Cardiac Enzymes:  Recent Labs Lab 05/27/15 1616 05/28/15 0226 05/28/15 0707 05/28/15 1515  TROPONINI 0.03 0.04* 0.03 0.03   BNP (last 3 results)  Recent Labs  05/28/15 0226  BNP 515.4*   CBG:  Recent Labs Lab 05/27/15 1923 05/28/15 0835 05/28/15 1137 05/31/15 1157  GLUCAP 114* 95 92 99    Recent Results (from the past 240 hour(s))  Blood Culture (routine x 2)     Status: None (Preliminary result)   Collection Time: 05/27/15  4:16 PM  Result Value Ref  Range Status   Specimen Description BLOOD RIGHT FOREARM  Final   Special Requests   Final    BOTTLES DRAWN AEROBIC AND ANAEROBIC  AER 5CC ANA 3CC   Culture  Setup Time   Final    GRAM NEGATIVE RODS IN BOTH AEROBIC AND ANAEROBIC BOTTLES CRITICAL RESULT CALLED TO, READ BACK BY AND VERIFIED WITH: Mali VAN DYKE AT 8768 05/28/15.PMH CONFIRMED BY [facility]    Culture   Final    SALMONELLA SPECIES IN BOTH AEROBIC AND ANAEROBIC BOTTLES REFERRED TO [place] IN [place] IDENTIFICATION/CONFIRMATION Results Called to: [place] AT 1157 05/30/15 DV    Report Status PENDING  Incomplete   Organism ID, Bacteria SALMONELLA SPECIES  Final      Susceptibility   Salmonella species - MIC*    AMPICILLIN <=2 SENSITIVE Sensitive  LEVOFLOXACIN <=0.12 SENSITIVE Sensitive     TRIMETH/SULFA <=20 SENSITIVE Sensitive     * SALMONELLA SPECIES  Blood Culture (routine x 2)     Status: None   Collection Time: 05/27/15  4:16 PM  Result Value Ref Range Status   Specimen Description BLOOD RIGHT HAND  Final   Special Requests BOTTLES DRAWN AEROBIC AND ANAEROBIC  1CC  Final   Culture  Setup Time   Final    GRAM NEGATIVE RODS IN BOTH AEROBIC AND ANAEROBIC BOTTLES CRITICAL RESULT CALLED TO, READ BACK BY AND VERIFIED WITH: Mali VAN DYKE AT 3762 05/28/15.PMH CONFIRMED BY [facility] CRITICAL VALUE NOTED.  VALUE IS CONSISTENT WITH PREVIOUSLY REPORTED AND CALLED VALUE.    Culture   Final    SALMONELLA SPECIES IN BOTH AEROBIC AND ANAEROBIC BOTTLES PREVIOUSLY CALLED REFERRED TO [facility] IN [place], [place] FOR IDENTIFICATION/CONFIRMATION    Report Status 05/30/2015 FINAL  Final   Organism ID, Bacteria SALMONELLA SPECIES  Final      Susceptibility   Salmonella species - MIC*    AMPICILLIN <=2 SENSITIVE Sensitive     LEVOFLOXACIN <=0.12 SENSITIVE Sensitive     TRIMETH/SULFA <=20 SENSITIVE Sensitive     * SALMONELLA SPECIES  Urine culture     Status:  None   Collection Time: 05/27/15  4:53 PM  Result Value Ref Range Status   Specimen Description URINE, RANDOM  Final   Special Requests NONE  Final   Culture >=100,000 COLONIES/mL ESCHERICHIA COLI  Final   Report Status 05/29/2015 FINAL  Final   Organism ID, Bacteria ESCHERICHIA COLI  Final      Susceptibility   Escherichia coli - MIC*    AMPICILLIN <=2 SENSITIVE Sensitive     CEFTAZIDIME <=1 SENSITIVE Sensitive     CEFAZOLIN <=4 SENSITIVE Sensitive     CEFTRIAXONE <=1 SENSITIVE Sensitive     CIPROFLOXACIN <=0.25 SENSITIVE Sensitive     GENTAMICIN <=1 SENSITIVE Sensitive     IMIPENEM <=0.25 SENSITIVE Sensitive     TRIMETH/SULFA <=20 SENSITIVE Sensitive     NITROFURANTOIN Value in next row Sensitive      SENSITIVE<=16    PIP/TAZO Value in next row Sensitive      SENSITIVE<=4    * >=100,000 COLONIES/mL ESCHERICHIA COLI  MRSA PCR Screening     Status: None   Collection Time: 05/27/15  7:36 PM  Result Value Ref Range Status   MRSA by PCR NEGATIVE NEGATIVE Final    Comment:        The GeneXpert MRSA Assay (FDA approved for NASAL specimens only), is one component of a comprehensive MRSA colonization surveillance program. It is not intended to diagnose MRSA infection nor to guide or monitor treatment for MRSA infections.   Body fluid culture     Status: None (Preliminary result)   Collection Time: 05/31/15  8:00 AM  Result Value Ref Range Status   Specimen Description FLUID WRIST RIGHT SYNOVIAL  Final   Special Requests Normal  Final   Gram Stain   Final    ABUNDANT WBC PRESENT,BOTH PMN AND MONONUCLEAR NO ORGANISMS SEEN    Culture   Final    MODERATE GRAM NEGATIVE RODS CRITICAL RESULT CALLED TO, READ BACK BY AND VERIFIED WITH: A AHATSAWO,RN AT 8315 05/29/15 BY L BENFIELD REPEATING IDENTIFICATION AND SENSITIVITIES    Report Status PENDING  Incomplete     Studies: No results found.  Scheduled Meds: . antiseptic oral rinse  7 mL Mouth Rinse q12n4p  .  chlorhexidine  15  mL Mouth Rinse BID  . furosemide  40 mg Oral Daily  . lidocaine  1 patch Transdermal Q24H  . metoprolol tartrate  25 mg Oral BID  . nystatin  5 mL Oral QID  . oxybutynin  5 mg Oral Daily  . pantoprazole  40 mg Oral BID AC  . predniSONE  60 mg Oral Q breakfast  . sulfamethoxazole-trimethoprim  1 tablet Oral Q12H   Continuous Infusions:   Principal Problem:   Salmonella bacteremia Active Problems:   Sepsis   Acute respiratory failure with hypoxemia   Acute respiratory failure   Right wrist deformity   Lethargic    Time spent: 35 minutes    Ellayna Hilligoss, [facility] Pager [phone]. If 7PM-7AM, please contact night-coverage at [email or web address], password [facility] 06/02/2015, 2:06 PM  LOS: 6 days

## 2015-06-03 DIAGNOSIS — R4182 Altered mental status, unspecified: Secondary | ICD-10-CM

## 2015-06-03 LAB — BASIC METABOLIC PANEL
ANION GAP: 12 (ref 5–15)
BUN: 56 mg/dL — ABNORMAL HIGH (ref 6–20)
CO2: 22 mmol/L (ref 22–32)
Calcium: 8.3 mg/dL — ABNORMAL LOW (ref 8.9–10.3)
Chloride: 103 mmol/L (ref 101–111)
Creatinine, Ser: 1.61 mg/dL — ABNORMAL HIGH (ref 0.44–1.00)
GFR, EST AFRICAN AMERICAN: 36 mL/min — AB (ref >60)
GFR, EST NON AFRICAN AMERICAN: 31 mL/min — AB (ref >60)
Glucose, Bld: 118 mg/dL — ABNORMAL HIGH (ref 65–99)
POTASSIUM: 4.1 mmol/L (ref 3.5–5.1)
SODIUM: 137 mmol/L (ref 135–145)

## 2015-06-03 LAB — CBC
HCT: 34 % — ABNORMAL LOW (ref 36.0–46.0)
Hemoglobin: 11.6 g/dL — ABNORMAL LOW (ref 12.0–15.0)
MCH: 27 pg (ref 26.0–34.0)
MCHC: 34.1 g/dL (ref 30.0–36.0)
MCV: 79.3 fL (ref 78.0–100.0)
PLATELETS: 37 10*3/uL — AB (ref 150–400)
RBC: 4.29 MIL/uL (ref 3.87–5.11)
RDW: 19.6 % — ABNORMAL HIGH (ref 11.5–15.5)
WBC: 31.8 10*3/uL — AB (ref 4.0–10.5)

## 2015-06-03 LAB — SAVE SMEAR

## 2015-06-03 MED ORDER — FLUTICASONE PROPIONATE 50 MCG/ACT NA SUSP
1.0000 | Freq: Every day | NASAL | Status: DC
  Administered 2015-06-03 – 2015-06-07 (×5): 1 via NASAL
  Filled 2015-06-03: qty 16

## 2015-06-03 MED ORDER — PREDNISONE 50 MG PO TABS
60.0000 mg | ORAL_TABLET | Freq: Every day | ORAL | Status: DC
  Administered 2015-06-04 – 2015-06-07 (×3): 60 mg via ORAL
  Filled 2015-06-03 (×6): qty 1

## 2015-06-03 NOTE — Progress Notes (Signed)
[name]   HEMATOLOGY/ONCOLOGY INPATIENT PROGRESS NOTE  Date of Service: 06/03/2015  Inpatient Attending: .Elmarie Shiley, MD   SUBJECTIVE  Patient was seen and examined this morning. As [place] to see the patient due to her slight drop in platelet counts yesterday. Received 80 mg of prednisone yesterday and today with the bump in her platelet counts now up to 37,000. She is up and out of bed that she is feeling much better. No issues with bleeding. The steroids are appearing to make her feel hungry and she is eating much better. No fevers or chills. Daughter at bedside. Notes that she is seen by a hematologist Dr Inez Pilgrim at [facility] and is being treated for iron deficiency anemia/ITP/CMML. Was previously getting Procrit shots for anemia which was held due to elevated blood pressure. Is on oral iron.  Was last seen in clinic on 04/24/2015 when her platelet counts were 76,000. Patient was being treated for a Salmonella bacteremia and is apparently due to get a wrist I&D tomorrow by orthopedics.  OBJECTIVE:  PHYSICAL EXAMINATION: . Filed Vitals:   06/03/15 0600 06/03/15 0800 06/03/15 1000 06/03/15 1200  BP: 170/94 176/86 155/80 161/85  Pulse: 83 71 58 61  Temp:  97.6 F (36.4 C)  97.6 F (36.4 C)  TempSrc:  Oral  Oral  Resp:  26 19 22   Height:      Weight:      SpO2: 98% 100% 100% 100%   Filed Weights   05/30/15 1947 05/31/15 1900 06/01/15 0500  Weight: 123 lb (55.792 kg) 122 lb 3.2 oz (55.43 kg) 123 lb 7.3 oz (56 kg)   .Body mass index is 20.54 kg/(m^2).  GENERAL:alert, in no acute distress and comfortable sitting in the chair daughter at bedside SKIN: No acute rashes NECK: supple, no JVD, thyroid normal size, non-tender, without nodularity LYMPH:  no palpable lymphadenopathy in the cervical, axillary or inguinal LUNGS: clear to auscultation with normal respiratory effort HEART: regular rate & rhythm,  no murmurs and no lower extremity edema ABDOMEN: abdomen soft, non-tender, normoactive  bowel sounds, no hepatosplenomegaly Musculoskeletal: no pedal edema PSYCH: alert & oriented x 3 with fluent speech NEURO: no focal motor/sensory deficits  MEDICAL HISTORY:  Past Medical History  Diagnosis Date  . Hypertension   . Hyperlipidemia     SURGICAL HISTORY: History reviewed. No pertinent past surgical history.  SOCIAL HISTORY: Social History   Social History  . Marital Status: Married    Spouse Name: N/A  . Number of Children: N/A  . Years of Education: N/A   Occupational History  . Not on file.   Social History Main Topics  . Smoking status: Never Smoker   . Smokeless tobacco: Not on file  . Alcohol Use: No  . Drug Use: No  . Sexual Activity: Not on file   Other Topics Concern  . Not on file   Social History Narrative    FAMILY HISTORY: History reviewed. No pertinent family history.  ALLERGIES:  is allergic to aspirin.  MEDICATIONS:  Scheduled Meds: . antiseptic oral rinse  7 mL Mouth Rinse q12n4p  . chlorhexidine  15 mL Mouth Rinse BID  . fluticasone  1 spray Each Nare Daily  . folic acid  1 mg Oral Daily  . furosemide  40 mg Oral Daily  . lidocaine  1 patch Transdermal Q24H  . metoprolol tartrate  25 mg Oral BID  . nystatin  5 mL Oral QID  . oxybutynin  5 mg Oral Daily  .  pantoprazole  40 mg Oral BID AC  . [START ON 06/04/2015] predniSONE  60 mg Oral Q breakfast  . sulfamethoxazole-trimethoprim  1 tablet Oral Q12H   Continuous Infusions:  PRN Meds:.sodium chloride, acetaminophen, acetaminophen, hydrALAZINE, ipratropium-albuterol, ondansetron (ZOFRAN) IV  REVIEW OF SYSTEMS:    10 Point review of Systems was done is negative except as noted above.   LABORATORY DATA:  I have reviewed the data as listed  . CBC Latest Ref Rng 06/03/2015 06/02/2015 06/02/2015  WBC 4.0 - 10.5 K/uL 31.8(H) 25.0(H) 31.0(H)  Hemoglobin 12.0 - 15.0 g/dL 11.6(L) 11.1(L) 11.1(L)  Hematocrit 36.0 - 46.0 % 34.0(L) 33.1(L) 33.3(L)  Platelets 150 - 400 K/uL 37(L)  31(L) 14(LL)    . CMP Latest Ref Rng 06/03/2015 06/02/2015 06/01/2015  Glucose 65 - 99 mg/dL 118(H) 103(H) 141(H)  BUN 6 - 20 mg/dL 56(H) 60(H) 52(H)  Creatinine 0.44 - 1.00 mg/dL 1.61(H) 1.65(H) 1.72(H)  Sodium 135 - 145 mmol/L 137 137 142  Potassium 3.5 - 5.1 mmol/L 4.1 4.1 3.9  Chloride 101 - 111 mmol/L 103 103 107  CO2 22 - 32 mmol/L 22 21(L) 25  Calcium 8.9 - 10.3 mg/dL 8.3(L) 8.5(L) 8.4(L)  Total Protein 6.5 - 8.1 g/dL - - -  Total Bilirubin 0.3 - 1.2 mg/dL - - -  Alkaline Phos 38 - 126 U/L - - -  AST 15 - 41 U/L - - -  ALT 14 - 54 U/L - - -   PBS (Personally reviewed by me)   left shifted myeloid with no significantly elevated blasts. Some dysplastic looking neutrophils. increased monocytic cells - consistent with CMML. Decreased platelets with some large platelets. No platelet clumping. No schistocytes or microspherocytes noted.     RADIOGRAPHIC STUDIES: I have personally reviewed the radiological images as listed and agreed with the findings in the report. Dg Wrist Complete Right  05/28/2015   CLINICAL DATA:  Posterior wrist swelling. CML  EXAM: RIGHT WRIST - COMPLETE 3+ VIEW  COMPARISON:  None.  FINDINGS: There is a large erosion with overhanging edges involving the lateral distal radius with irregularity of the medial ulnar head. An ill-defined lucency in the distal ulnar metadiaphysis is likely related to the same process. There is soft tissue fullness in this region, but no calcified soft tissue mass.  Advanced first [facility] joint narrowing with subchondral cystic change and spurring.  Osteopenia.  No acute fracture dislocation.  Limited study due to patient condition, with no true lateral imaging.  IMPRESSION: 1. Erosions centered at the distal radial ulnar joint, likely related to patient's history of gout. 2. Limited portable study.   Electronically Signed   By: Monte Fantasia M.D.   On: 05/28/2015 01:33   Ct Head Wo Contrast  05/31/2015   CLINICAL DATA:  Lethargy.   Occipital headache today.  EXAM: CT HEAD WITHOUT CONTRAST  TECHNIQUE: Contiguous axial images were obtained from the base of the skull through the vertex without intravenous contrast.  COMPARISON:  None.  FINDINGS: The brainstem, cerebellum, cerebral peduncles, thalamus, basal ganglia, basilar cisterns, and ventricular system appear within normal limits. Periventricular white matter and corona radiata hypodensities favor chronic ischemic microvascular white matter disease. No intracranial hemorrhage, mass lesion, or acute CVA.  There is opacification of a single right-sided ethmoid air cell.  IMPRESSION: 1. No acute intracranial findings. 2. Periventricular white matter and corona radiata hypodensities favor chronic ischemic microvascular white matter disease. 3. Minimal chronic ethmoid sinusitis.   Electronically Signed   By: Van Clines  M.D.   On: 05/31/2015 12:43   Mr Wrist Right Wo Contrast  05/30/2015   CLINICAL DATA:  Right wrist deformity and pain.  EXAM: MR OF THE RIGHT WRIST WITHOUT CONTRAST  TECHNIQUE: Multiplanar, multisequence MR imaging of the right wrist was performed. No intravenous contrast was administered.  COMPARISON:  05/27/2015  FINDINGS: Despite efforts by the technologist and patient, motion artifact is present on today's exam and could not be eliminated. This reduces exam sensitivity and specificity. The patient had difficulty tolerating imaging.  Ligaments: Greater than expected fluid signal along the proximal scapholunate interval, but quite likely due to erosion rather than definite scapholunate ligament tear. No scapholunate separation is identified.  Triangular fibrocartilage: TFCC disc not well seen, suspected chronic tear.  Tendons: Prominent fluid distention of the tendon sheaths of extensor digitorum compartment (extensor compartment 4) at the level of the wrist, in continuity with the eroded effusion of the distal radioulnar joint, with associated filling defects  compatible with synovitis. Possibly torn or deficient extensor digitorum tendons of the third and fourth fingers, which seem to reconstitute along the distal margin of the cystic collection.  Carpal tunnel/median nerve: Grossly unremarkable.  Guyon's canal: Unremarkable  Joint/cartilage: Prominent erosions along the distal radioulnar joint with contiguity of the dry to with the fourth extensor compartment in the radiocarpal joint.  Bones/carpal alignment: In addition to the large erosions of the distal radial ulnar joint, there are degenerative appearing findings with spurring and loss of articular space at the first and second carpometacarpal articulations.  Other: No supplemental non-categorized findings.  IMPRESSION: 1. 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 Portions of the extensor digitorum tendons, including those extending to the third and fourth fingers, may be torn or partially torn. Differential diagnostic considerations favor rheumatoid arthritis, gout, or similar erosive arthropathy. Infection unlikely but not entirely excluded. It should be straightforward to sample the dorsal fluid collection with a needle, the large fluid collection is only 4 mm below the skin surface. 2. Degenerative findings at the first and second carpometacarpal articulations.   Electronically Signed   By: Van Clines M.D.   On: 05/30/2015 14:29   Dg Chest Port 1 View  05/28/2015   CLINICAL DATA:  Acute respiratory failure  EXAM: PORTABLE CHEST - 1 VIEW  COMPARISON:  05/27/2015  FINDINGS: Chronic cardiomegaly.  Hypoventilation which limits evaluation. Lower lung opacities again seen, somewhat confluent in the retrocardiac lung. No edema, effusion, or pneumothorax.  IMPRESSION: Stable hypoventilation and  basilar atelectasis. Cannot exclude retrocardiac pneumonia.   Electronically Signed   By: Monte Fantasia M.D.   On: 05/28/2015 01:35   Dg Chest Port 1 View  05/27/2015   CLINICAL DATA:  74 year old female with fever, hypoxia, tachycardia. Initial encounter.  EXAM: PORTABLE CHEST - 1 VIEW  COMPARISON:  06/28/2013.  FINDINGS: Portable AP upright view at 1621 hours. Lower lung volumes. Crowding of lung markings. Stable cardiomegaly and mediastinal contours. No pneumothorax, pulmonary edema, pleural effusion or consolidation.  IMPRESSION: Low lung volumes with atelectasis.  Stable cardiomegaly.   Electronically Signed   By: Genevie Ann M.D.   On: 05/27/2015 16:44   Dg Shoulder Left Port  05/28/2015   CLINICAL DATA:  Bilateral shoulder pain  EXAM: LEFT SHOULDER - 1 VIEW  COMPARISON:  None.  FINDINGS: No fracture or dislocation is seen.  Mild degenerative changes of the acromioclavicular joint.  The visualized soft tissues are unremarkable.  Visualized left lung is essentially clear.  IMPRESSION: No fracture or dislocation is seen.   Electronically Signed   By: Julian Hy M.D.   On: 05/28/2015 21:29   Dg Shoulder Right Port  05/28/2015   CLINICAL DATA:  Bilateral shoulder pain  EXAM: PORTABLE RIGHT SHOULDER - 2+ VIEW  COMPARISON:  None.  FINDINGS: No fracture or dislocation is seen.  Degenerative changes of the acromioclavicular joint.  Visualized soft tissues are within normal limits.  Visualized right lung is clear.  IMPRESSION: No fracture or dislocation is seen.   Electronically Signed   By: Julian Hy M.D.   On: 05/28/2015 21:29    ASSESSMENT & PLAN:    74 year old woman with the following issues:  1. Thrombocytopenia: Patient has a history of steroid responsive ITP as per Dr. Maree Krabbe note and from [place]. The presence of infection and antibiotics could be additional factors. Improving on prednisone. Plan -Reduce prednisone back to 60 mg by mouth daily -Might start tapering steroids  gradually once platelets more than 50,000 (has a baseline count of about 78,000 in July 2016) -If platelets were drop significantly again would consider IVIG. -If the patient is going to be on Bactrim for any length of time continue folic acid 1 mg by mouth daily.  2. 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 Additional leukocytosis could be due to infection and steroid use . Previously BCR ABL and PDGFR testing were apparently negative which rules out CML and imatinib sensitive CMML.   3. Anemia: Multifactorial in nature.She has had a history of iron deficiency and chronic kidney disease causing anemia of chronic disease. -We'll continue following with her primary hematologist for all above 3 issues on discharge. - prn PRBC transfusion to keep hgb >7 or if surgery contemplated around 10.  4. Altered mental status: Possibly related to sepsis. His CT scan did not show any evidence of intracranial bleed.Appears to have resolved . Close to baseline per her daughter .  I spent 25 minutes counseling the patient face to face. The total time spent in the appointment was 35 minutes and more than 50% was on counseling and direct patient cares.    Sullivan Lone MD [place] AAHIVMS [facility] Hematology/Oncology Physician [facility]  (Office):       [phone] (Work cell):  [phone] (Fax):           [phone]  06/03/2015 2:19 PM

## 2015-06-03 NOTE — Progress Notes (Signed)
[facility] PROGRESS NOTE  [name] ATF:[id number] DOB: 05-18-1941 DOA: 05/27/2015 PCP: Leotis Shames, MD  Assessment/Plan: Patient is a very pleasant 74 year old African-American female with past medical history significant for CML, ITP, anemia of chronic disease, hypertension, hyperlipidemia, chronic diastolic heart failure on echocardiogram in 2014, who was taken to the emergency at [address] by her son, for altered mental status. Patient had been in her usual state of health when that  morning her son, who she lives with, noticed that she quite wasn't herself with confusion, inattentiveness, drowsiness and most importantly failure to take her a.m. medications which she always does. She was noted to be febrile and was incontinent. I am unable to get a history from the patient as she was on BiPAP on admission.   Patient was admitted with sepsis, found to have E coli UTI, Salmonella bacteremia, septic wrist joint. Patient also develops worsening thrombocytopenia, leukocytosis. Oncology consulted. Dr Grandville Silos with hand surgery is planning wrist debride on Tuesday if patient is medical stable.    1-Sepsis due to E. Coli UTI, salmonella Bacteremia.  -Afebrile.  -will continue supportive care and follow clinical response -PRN antipyretics  -on oral Bactrim.   2-Acute hypoxemic resp failure: with ?? Pulmonary edema -patient initially required BIPAP -now breathing easier and maintaining O2 sat on [place] -will continue lasix PO now and follow volume status, increase lasix to 40 mg on 8-19.  -will continue flutter valve -continue PRN nebulizer and will start pulmicort Weight :  56--60--61.55--56. Will reorder daily weight.   3-Acute on chronic diastolic heart failure -neg troponin and no CP -will follow daily weights, strict intake and output and continue diuretics -continue with lasix.   4-Acute on chronic renal failure: stage 3 at baseline with Cr at 1. -will continue  tx for UTI -on PO lasix now, home dose -will monitor Cr trend -cr  At 1.6.  Peak at 1.9 during this admission.  -monitor on lasix,  Renal function stable.   5-Toxic metabolic encephalopathy: due to UTI, sepsis.  -will monitor and continue constant re-orientation -check CBG.  -Ct head negative.  -improved.   6-Right wrist deformity and pain: infection.  -orthopedic service consulted -MRI; erosive arthropathy radioulnar joint. Differential RA, Gout. S/P fluid aspiration.  -synovial fluid cell count 22,000.  -culture no growth to date.  -plan for surgery on Tuesday if medical stable.   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 Received one dose  IV solumedrol.  -Appreciate Dr Alen Blew. Continue with prednisone 80 mg daily.  -Discussed with oncologist on call 8-20. Increased prednisone to 80 mg daily.  -platelet increasing today at 37. If plan for surgery, might need platelet transfusion.   8-Salmonela Bacteremia:  ID consulted and has sign off at this time.  Antibiotics change to Bactrim. Need bactrim for 5 more weeks.   9-GERD: will continue PPI  10-Leukocytosis: due to hx of CML and infection -slowly trending down -no evidence of acute leukemia per oncologist.  -in setting of infection. Monitor.   11-Anemia: chronic disease. Received 2 units PRBC 8-19 Hb stable/  lactic acid: resolved.  HTN: stable -will continue metoprolol and lasix  Code Status: Full Family Communication: patient and daughter Disposition Plan: transfer to med-surgery    Consultants:  PCCM  Procedures:  2-D echo - Left ventricle: The cavity size was normal. Wall thickness was normal. Systolic function was normal. The estimated ejection fraction was in the  range of 60% to 65%. Wall motion was normal; there were no regional wall motion abnormalities. Doppler parameters are consistent  with abnormal left ventricular relaxation (grade 1 diastolic dysfunction). - Aortic valve: There was no stenosis. - Mitral valve: There was trivial regurgitation. - Left atrium: The atrium was moderately dilated. - Right ventricle: The cavity size was normal. Systolic function was normal. - Tricuspid valve: Peak RV-RA gradient (S): 40 mm Hg. - Pulmonary arteries: PA peak pressure: 43 mm Hg (S). - Inferior vena cava: The vessel was normal in size. The respirophasic diameter changes were in the normal range (>= 50%), consistent with normal central venous pressure.  Impressions: - Normal LV size with EF 60-65%. Normal RV size and systolic function. Moderate LAE. No significant valvular abnormalities. Mild pulmonary hypertension.  Antibiotics: Vanc 8/14 >>8/14 Zosyn 8/14 >> 8/14 Rocephin 8/14 >>   HPI/Subjective: She is sitting in chair. She is alert, complaining of shoulder pain, but better than yesterday. She denies dyspnea.    Objective: Filed Vitals:   06/03/15 0600  BP: 170/94  Pulse: 83  Temp:   Resp:     Intake/Output Summary (Last 24 hours) at 06/03/15 0818 Last data filed at 06/02/15 2000  Gross per 24 hour  Intake    410 ml  Output      0 ml  Net    410 ml   Filed Weights   05/30/15 1947 05/31/15 1900 06/01/15 0500  Weight: 55.792 kg (123 lb) 55.43 kg (122 lb 3.2 oz) 56 kg (123 lb 7.3 oz)    Exam:   General:  Alert in no acute distress.   Cardiovascular: S1 and S2, no rubs or gallops  Respiratory: scattered rhonchi, no frank crackles; positive mild exp wheezing   Abdomen: soft, NT, ND, positive BS  Musculoskeletal: right hand with wrist deformity and decreased  pain to touch; no erythema seen,   Data Reviewed: Basic Metabolic Panel:  Recent Labs Lab 05/28/15 0707  05/30/15 0555 05/31/15 0605 06/01/15 0227 06/02/15 1009 06/03/15 0318  NA 140  < > 142 147* 142 137 137  K 3.9  < > 3.2* 3.9 3.9 4.1 4.1  CL 110  < > 109 114* 107  103 103  CO2 24  < > 23 27 25  21* 22  GLUCOSE 96  < > 124* 116* 141* 103* 118*  BUN 65*  < > 65* 56* 52* 60* 56*  CREATININE 1.93*  < > 1.69* 1.68* 1.72* 1.65* 1.61*  CALCIUM 8.7*  < > 8.4* 8.6* 8.4* 8.5* 8.3*  MG 1.8  --   --   --   --   --   --   PHOS 2.2*  --   --   --   --   --   --   < > = values in this interval not displayed. Liver Function Tests:  Recent Labs Lab 05/27/15 1616 05/29/15 0536  AST 36 27  ALT 18 21  ALKPHOS 59 70  BILITOT 1.9* 1.2  PROT 7.4 7.5  ALBUMIN 2.7* 2.5*   CBC:  Recent Labs Lab 05/29/15 0536  05/31/15 0605 06/01/15 0227 06/02/15 1009 06/02/15 1720 06/03/15 0318  WBC 34.6*  < > 31.7* 31.3* 31.0* 25.0* 31.8*  NEUTROABS 27.3*  --   --   --   --  20.9*  --   HGB 8.5*  < > 8.1* 7.3* 11.1* 11.1* 11.6*  HCT 26.3*  < > 25.3* 23.3* 33.3* 33.1* 34.0*  MCV 77.8*  < >  79.8 81.8 80.2 79.2 79.3  PLT 23*  < > 8* 22* 14* 31* 37*  < > = values in this interval not displayed. Cardiac Enzymes:  Recent Labs Lab 05/27/15 1616 05/28/15 0226 05/28/15 0707 05/28/15 1515  TROPONINI 0.03 0.04* 0.03 0.03   BNP (last 3 results)  Recent Labs  05/28/15 0226  BNP 515.4*   CBG:  Recent Labs Lab 05/27/15 1923 05/28/15 0835 05/28/15 1137 05/31/15 1157  GLUCAP 114* 95 92 99    Recent Results (from the past 240 hour(s))  Blood Culture (routine x 2)     Status: None (Preliminary result)   Collection Time: 05/27/15  4:16 PM  Result Value Ref Range Status   Specimen Description BLOOD RIGHT FOREARM  Final   Special Requests   Final    BOTTLES DRAWN AEROBIC AND ANAEROBIC  AER 5CC ANA 3CC   Culture  Setup Time   Final    GRAM NEGATIVE RODS IN BOTH AEROBIC AND ANAEROBIC BOTTLES CRITICAL RESULT CALLED TO, READ BACK BY AND VERIFIED WITH: Mali VAN DYKE AT 5027 05/28/15.PMH CONFIRMED BY [facility]    Culture   Final    SALMONELLA SPECIES IN BOTH AEROBIC AND ANAEROBIC BOTTLES REFERRED TO [facility] IN [place], [place]  IDENTIFICATION/CONFIRMATION Results Called to: Tucker AT 7412 05/30/15 DV    Report Status PENDING  Incomplete   Organism ID, Bacteria SALMONELLA SPECIES  Final      Susceptibility   Salmonella species - MIC*    AMPICILLIN <=2 SENSITIVE Sensitive     LEVOFLOXACIN <=0.12 SENSITIVE Sensitive     TRIMETH/SULFA <=20 SENSITIVE Sensitive     * SALMONELLA SPECIES  Blood Culture (routine x 2)     Status: None   Collection Time: 05/27/15  4:16 PM  Result Value Ref Range Status   Specimen Description BLOOD RIGHT HAND  Final   Special Requests BOTTLES DRAWN AEROBIC AND ANAEROBIC  1CC  Final   Culture  Setup Time   Final    GRAM NEGATIVE RODS IN BOTH AEROBIC AND ANAEROBIC BOTTLES CRITICAL RESULT CALLED TO, READ BACK BY AND VERIFIED WITH: Mali VAN DYKE AT 8786 05/28/15.PMH CONFIRMED BY [facility] CRITICAL VALUE NOTED.  VALUE IS CONSISTENT WITH PREVIOUSLY REPORTED AND CALLED VALUE.    Culture   Final    SALMONELLA SPECIES IN BOTH AEROBIC AND ANAEROBIC BOTTLES PREVIOUSLY CALLED REFERRED TO [facility] IN [place], [place] FOR IDENTIFICATION/CONFIRMATION    Report Status 05/30/2015 FINAL  Final   Organism ID, Bacteria SALMONELLA SPECIES  Final      Susceptibility   Salmonella species - MIC*    AMPICILLIN <=2 SENSITIVE Sensitive     LEVOFLOXACIN <=0.12 SENSITIVE Sensitive     TRIMETH/SULFA <=20 SENSITIVE Sensitive     * SALMONELLA SPECIES  Urine culture     Status: None   Collection Time: 05/27/15  4:53 PM  Result Value Ref Range Status   Specimen Description URINE, RANDOM  Final   Special Requests NONE  Final   Culture >=100,000 COLONIES/mL ESCHERICHIA COLI  Final   Report Status 05/29/2015 FINAL  Final   Organism ID, Bacteria ESCHERICHIA COLI  Final      Susceptibility   Escherichia coli - MIC*    AMPICILLIN <=2 SENSITIVE Sensitive     CEFTAZIDIME <=1 SENSITIVE Sensitive     CEFAZOLIN <=4 SENSITIVE Sensitive     CEFTRIAXONE <=1 SENSITIVE Sensitive      CIPROFLOXACIN <=0.25 SENSITIVE Sensitive  GENTAMICIN <=1 SENSITIVE Sensitive     IMIPENEM <=0.25 SENSITIVE Sensitive     TRIMETH/SULFA <=20 SENSITIVE Sensitive     NITROFURANTOIN Value in next row Sensitive      SENSITIVE<=16    PIP/TAZO Value in next row Sensitive      SENSITIVE<=4    * >=100,000 COLONIES/mL ESCHERICHIA COLI  MRSA PCR Screening     Status: None   Collection Time: 05/27/15  7:36 PM  Result Value Ref Range Status   MRSA by PCR NEGATIVE NEGATIVE Final    Comment:        The GeneXpert MRSA Assay (FDA approved for NASAL specimens only), is one component of a comprehensive MRSA colonization surveillance program. It is not intended to diagnose MRSA infection nor to guide or monitor treatment for MRSA infections.   Body fluid culture     Status: None (Preliminary result)   Collection Time: 05/31/15  8:00 AM  Result Value Ref Range Status   Specimen Description FLUID WRIST RIGHT SYNOVIAL  Final   Special Requests Normal  Final   Gram Stain   Final    ABUNDANT WBC PRESENT,BOTH PMN AND MONONUCLEAR NO ORGANISMS SEEN    Culture   Final    MODERATE GRAM NEGATIVE RODS CRITICAL RESULT CALLED TO, READ BACK BY AND VERIFIED WITH: A AHATSAWO,RN AT 1007 05/29/15 BY L BENFIELD REPEATING IDENTIFICATION AND SENSITIVITIES    Report Status PENDING  Incomplete     Studies: No results found.  Scheduled Meds: . antiseptic oral rinse  7 mL Mouth Rinse q12n4p  . chlorhexidine  15 mL Mouth Rinse BID  . folic acid  1 mg Oral Daily  . furosemide  40 mg Oral Daily  . lidocaine  1 patch Transdermal Q24H  . metoprolol tartrate  25 mg Oral BID  . nystatin  5 mL Oral QID  . oxybutynin  5 mg Oral Daily  . pantoprazole  40 mg Oral BID AC  . predniSONE  80 mg Oral Q breakfast  . sulfamethoxazole-trimethoprim  1 tablet Oral Q12H   Continuous Infusions:   Principal Problem:   Salmonella bacteremia Active Problems:   Sepsis   Acute respiratory failure with hypoxemia   Acute  respiratory failure   Right wrist deformity   Lethargic    Time spent: 35 minutes    Kamrin Sibley, [facility] Pager [phone]. If 7PM-7AM, please contact night-coverage at [email or web address], password [facility] 06/03/2015, 8:18 AM  LOS: 7 days

## 2015-06-04 DIAGNOSIS — M009 Pyogenic arthritis, unspecified: Secondary | ICD-10-CM | POA: Diagnosis present

## 2015-06-04 DIAGNOSIS — M21931 Unspecified acquired deformity of right forearm: Secondary | ICD-10-CM

## 2015-06-04 DIAGNOSIS — R7881 Bacteremia: Secondary | ICD-10-CM

## 2015-06-04 DIAGNOSIS — D693 Immune thrombocytopenic purpura: Secondary | ICD-10-CM

## 2015-06-04 LAB — BASIC METABOLIC PANEL
Anion gap: 8 (ref 5–15)
BUN: 47 mg/dL — ABNORMAL HIGH (ref 6–20)
CHLORIDE: 104 mmol/L (ref 101–111)
CO2: 25 mmol/L (ref 22–32)
CREATININE: 1.45 mg/dL — AB (ref 0.44–1.00)
Calcium: 8.4 mg/dL — ABNORMAL LOW (ref 8.9–10.3)
GFR, EST AFRICAN AMERICAN: 41 mL/min — AB (ref >60)
GFR, EST NON AFRICAN AMERICAN: 35 mL/min — AB (ref >60)
Glucose, Bld: 88 mg/dL (ref 65–99)
POTASSIUM: 4.1 mmol/L (ref 3.5–5.1)
SODIUM: 137 mmol/L (ref 135–145)

## 2015-06-04 LAB — CBC
HCT: 36.5 % (ref 36.0–46.0)
HEMOGLOBIN: 11.8 g/dL — AB (ref 12.0–15.0)
MCH: 26.5 pg (ref 26.0–34.0)
MCHC: 32.3 g/dL (ref 30.0–36.0)
MCV: 82 fL (ref 78.0–100.0)
Platelets: 51 10*3/uL — ABNORMAL LOW (ref 150–400)
RBC: 4.45 MIL/uL (ref 3.87–5.11)
RDW: 20.5 % — ABNORMAL HIGH (ref 11.5–15.5)
WBC: 34.1 10*3/uL — ABNORMAL HIGH (ref 4.0–10.5)

## 2015-06-04 NOTE — Progress Notes (Signed)
Aspiration of right wrist now growing Salmonella, with no crystals. Plt at 37 today.  I discussed her situation with Dr. Michel Bickers of ID on Friday, and he agreed that surgically "debulking" the infection would likely be beneficial in his efforts to help eradicate this GNR infection, but indicated that he didn't consider such a procedure to be urgent.  Together, we decided to give this patient a few days to allow her to improve and the present plan is surgical debulking tomorrow at 0730.   I have spoken to the patient and the patients daughter and POA, Davy Pique, and they prefer to avoid general anesthesia.  We will proceed with local/MAC.  I will make the patient NPO after MN, and she will need platelets tomorrow in OR holding.  Micheline [facility] [phone]

## 2015-06-04 NOTE — Progress Notes (Signed)
IP PROGRESS NOTE  Subjective:   Patient is feeling well. Denied bleeding at this point. She reports neck pain.   Objective:  Vital signs in last 24 hours: Temp:  [97.6 F (36.4 C)-98.4 F (36.9 C)] 98.4 F (36.9 C) (08/22 0821) Pulse Rate:  [7-74] 65 (08/22 0821) Resp:  [17-23] 17 (08/22 0821) BP: (140-175)/(74-88) 160/85 mmHg (08/22 0821) SpO2:  [95 %-100 %] 99 % (08/22 0821) Weight change:  Last BM Date: 06/03/15  Intake/Output from previous day: 08/21 0701 - 08/22 0700 In: 960 [P.O.:960] Out: 0   Mouth: mucous membranes moist, pharynx normal without lesions Resp: clear to auscultation bilaterally Cardio: regular rate and rhythm, S1, S2 normal, no murmur, click, rub or gallop GI: soft, non-tender; bowel sounds normal; no masses,  no organomegaly Extremities: extremities normal, atraumatic, no cyanosis or edema    Lab Results:  Recent Labs  06/03/15 0318 06/04/15 0506  WBC 31.8* 34.1*  HGB 11.6* 11.8*  HCT 34.0* 36.5  PLT 37* 51*    BMET  Recent Labs  06/03/15 0318 06/04/15 0506  NA 137 137  K 4.1 4.1  CL 103 104  CO2 22 25  GLUCOSE 118* 88  BUN 56* 47*  CREATININE 1.61* 1.45*  CALCIUM 8.3* 8.4*    Assessment/Plan:  74 year old woman with the following issues:  1. 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 Her count is over 50 K on Prednisone.   I recommend continuing prednisone at 60 mg daily till discharge. I see no need for IVIG at this point.   She will need follow up as outpatient with [facility] (She saw Georgeanne Nim NP on7/21/2016) in the next one to two weeks.    2. 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 This is a chronic condition and likely her white cell count is aggravated by recent infection.  3. Anemia: Multifactorial in nature. She has element of iron deficiency and have had iron supplements in the past. She also has anemia of renal disease and  has been on growth factor support.  Hgb stable around 11 in the last few days. No Intravision is needed.   4. Altered mental status: Possibly related to sepsis. This appear to have resolved.   No further Hematology work up or follow up inpatient is needed. OK to be discharged with follow up as mentioned from hematology stand point.    Please call with questions.      LOS: 8 days   Jaivyn Gulla 06/04/2015, 9:23 AM

## 2015-06-04 NOTE — Progress Notes (Signed)
Physical Therapy Treatment Patient Details Name: [name] MRN: [id number] DOB: November 06, 1940 Today's Date: 06/04/2015    History of Present Illness Patient is a very pleasant 74 year old African-American female with past medical history significant for CML, ITP, anemia of chronic disease, hypertension, hyperlipidemia, chronic diastolic heart failure on echocardiogram in 2014, who was taken to the emergency department by her son Dallas Breeding, for altered mental status.  Positive for E. coli UTI.    PT Comments    Much more alert and participatory than last session; Making gains in activity tolerance;  Have updated goals;   If pt has 24 hour assist at home, it may be worth considering going home with HHPT/OT follow up;  Will need to verify if she has adequate assist   Follow Up Recommendations  SNF;Supervision/Assistance - 24 hour     Equipment Recommendations  None recommended by PT    Recommendations for Other Services       Precautions / Restrictions Precautions Precautions: Fall    Mobility  Bed Mobility               General bed mobility comments: in recliner  Transfers Overall transfer level: Needs assistance Equipment used: Rolling walker (2 wheeled) Transfers: Sit to/from Stand Sit to Stand: Mod assist         General transfer comment: Mod assist to pwer up to stand; Cues for safety and hand placement and to initiate with anterior weight shift  Ambulation/Gait Ambulation/Gait assistance: Min guard Ambulation Distance (Feet): 100 Feet Assistive device: Rolling walker (2 wheeled) Gait Pattern/deviations: Decreased stride length;Trunk flexed     General Gait Details: Cues for RW proximity , upright posture, and to self-monitor for activity tolerance   Stairs            Wheelchair Mobility    Modified Rankin (Stroke Patients Only)       Balance             Standing balance-Leahy Scale: Poor (approaching Fair)                       Cognition Arousal/Alertness: Awake/alert Behavior During Therapy: WFL for tasks assessed/performed Overall Cognitive Status: Within Functional Limits for tasks assessed (for simple mobility tasks)                 General Comments: Much improved interaction with therapist over last PT session    Exercises      General Comments        Pertinent Vitals/Pain      Home Living                      Prior Function            PT Goals (current goals can now be found in the care plan section) Acute Rehab PT Goals Patient Stated Goal: Agreeable to amb PT Goal Formulation: With patient Time For Goal Achievement: 06/14/15 Potential to Achieve Goals: Good Progress towards PT goals: Progressing toward goals;Goals met and updated - see care plan    Frequency  Min 3X/week    PT Plan Current plan remains appropriate    Co-evaluation             End of Session Equipment Utilized During Treatment: Gait belt Activity Tolerance: Patient tolerated treatment well Patient left: in chair;with call bell/phone within reach;with chair alarm set     Time: 6759-1638 PT Time Calculation (min) (ACUTE ONLY): 25 min  Charges:  $Gait Training: 8-22 mins $Therapeutic Activity: 8-22 mins                    G Codes:      Roney Marion [facility] 06/04/2015, 4:46 PM  Roney Marion, [place] Pager [phone] Office [phone]

## 2015-06-04 NOTE — Progress Notes (Signed)
Utilization review complete. Kiren Mcisaac RN CCM Case Mgmt phone [phone] 

## 2015-06-04 NOTE — Progress Notes (Signed)
Patient ID: [name], female   DOB: 07/21/1941, 74 y.o.   MRN: [id number]         [facility] for Infectious Disease    Date of Admission:  05/27/2015   Total days of antibiotics 9        Day 4 trimethoprim-sulfamethoxazole  Principal Problem:   Salmonella bacteremia Active Problems:   Sepsis   Right wrist deformity   Acute respiratory failure with hypoxemia   Acute respiratory failure   Lethargic   CMML (chronic myelomonocytic leukemia)   . fluticasone  1 spray Each Nare Daily  . folic acid  1 mg Oral Daily  . furosemide  40 mg Oral Daily  . lidocaine  1 patch Transdermal Q24H  . metoprolol tartrate  25 mg Oral BID  . nystatin  5 mL Oral QID  . oxybutynin  5 mg Oral Daily  . pantoprazole  40 mg Oral BID AC  . predniSONE  60 mg Oral Q breakfast  . sulfamethoxazole-trimethoprim  1 tablet Oral Q12H    SUBJECTIVE: She is feeling much better. She denies any pain in her right wrist.  Review of Systems: Pertinent items are noted in HPI.  Past Medical History  Diagnosis Date  . Hypertension   . Hyperlipidemia     Social History  Substance Use Topics  . Smoking status: Never Smoker   . Smokeless tobacco: None  . Alcohol Use: No    History reviewed. No pertinent family history. Allergies  Allergen Reactions  . Aspirin Rash    OBJECTIVE: Filed Vitals:   06/03/15 1628 06/03/15 2058 06/04/15 0500 06/04/15 0821  BP: 160/83 142/76 175/88 160/85  Pulse: 66 74 7 65  Temp: 97.9 F (36.6 C) 98.3 F (36.8 C) 97.8 F (36.6 C) 98.4 F (36.9 C)  TempSrc: Oral Oral Oral Oral  Resp: 18 18 18 17   Height:      Weight:      SpO2: 98% 95% 100% 99%   Body mass index is 20.54 kg/(m^2).  General: she is in very good spirits. She is visiting with her son Skin: no rash Lungs: clear Cor: regular S1 and S2 with no murmur Abdomen: soft and nontender Right wrist swelling/fluctuance persists but is not erythematous or tender  Lab Results Lab Results  Component  Value Date   WBC 34.1* 06/04/2015   HGB 11.8* 06/04/2015   HCT 36.5 06/04/2015   MCV 82.0 06/04/2015   PLT 51* 06/04/2015    Lab Results  Component Value Date   CREATININE 1.45* 06/04/2015   BUN 47* 06/04/2015   NA 137 06/04/2015   K 4.1 06/04/2015   CL 104 06/04/2015   CO2 25 06/04/2015    Lab Results  Component Value Date   ALT 21 05/29/2015   AST 27 05/29/2015   ALKPHOS 70 05/29/2015   BILITOT 1.2 05/29/2015     Microbiology: Recent Results (from the past 240 hour(s))  Blood Culture (routine x 2)     Status: None (Preliminary result)   Collection Time: 05/27/15  4:16 PM  Result Value Ref Range Status   Specimen Description BLOOD RIGHT FOREARM  Final   Special Requests   Final    BOTTLES DRAWN AEROBIC AND ANAEROBIC  AER 5CC ANA 3CC   Culture  Setup Time   Final    GRAM NEGATIVE RODS IN BOTH AEROBIC AND ANAEROBIC BOTTLES CRITICAL RESULT CALLED TO, READ BACK BY AND VERIFIED WITH: Mali VAN DYKE AT 5188 05/28/15.PMH CONFIRMED BY MPG  Culture   Final    [place] SPECIES IN BOTH AEROBIC AND ANAEROBIC BOTTLES REFERRED TO [facility] IN [place], [place] FOR IDENTIFICATION/CONFIRMATION Results Called to: [place] AT 4562 05/30/15 DV    Report Status PENDING  Incomplete   Organism ID, Bacteria [place] SPECIES  Final      Susceptibility   Salmonella species - MIC*    AMPICILLIN <=2 SENSITIVE Sensitive     LEVOFLOXACIN <=0.12 SENSITIVE Sensitive     TRIMETH/SULFA <=20 SENSITIVE Sensitive     * [place] SPECIES  Blood Culture (routine x 2)     Status: None   Collection Time: 05/27/15  4:16 PM  Result Value Ref Range Status   Specimen Description BLOOD RIGHT HAND  Final   Special Requests BOTTLES DRAWN AEROBIC AND ANAEROBIC  1CC  Final   Culture  Setup Time   Final    GRAM NEGATIVE RODS IN BOTH AEROBIC AND ANAEROBIC BOTTLES CRITICAL RESULT CALLED TO, READ BACK BY AND VERIFIED WITH: Mali VAN DYKE AT 5638 05/28/15.PMH CONFIRMED BY  MPG CRITICAL VALUE NOTED.  VALUE IS CONSISTENT WITH PREVIOUSLY REPORTED AND CALLED VALUE.    Culture   Final    [place] SPECIES IN BOTH AEROBIC AND ANAEROBIC BOTTLES PREVIOUSLY CALLED REFERRED TO [facility] IN [place], [place]  FOR IDENTIFICATION/CONFIRMATION    Report Status 05/30/2015 FINAL  Final   Organism ID, Bacteria [place] SPECIES  Final      Susceptibility   Salmonella species - MIC*    AMPICILLIN <=2 SENSITIVE Sensitive     LEVOFLOXACIN <=0.12 SENSITIVE Sensitive     TRIMETH/SULFA <=20 SENSITIVE Sensitive     * [place] SPECIES  Urine culture     Status: None   Collection Time: 05/27/15  4:53 PM  Result Value Ref Range Status   Specimen Description URINE, RANDOM  Final   Special Requests NONE  Final   Culture >=100,000 COLONIES/mL ESCHERICHIA COLI  Final   Report Status 05/29/2015 FINAL  Final   Organism ID, Bacteria ESCHERICHIA COLI  Final      Susceptibility   Escherichia coli - MIC*    AMPICILLIN <=2 SENSITIVE Sensitive     CEFTAZIDIME <=1 SENSITIVE Sensitive     CEFAZOLIN <=4 SENSITIVE Sensitive     CEFTRIAXONE <=1 SENSITIVE Sensitive     CIPROFLOXACIN <=0.25 SENSITIVE Sensitive     GENTAMICIN <=1 SENSITIVE Sensitive     IMIPENEM <=0.25 SENSITIVE Sensitive     TRIMETH/SULFA <=20 SENSITIVE Sensitive     NITROFURANTOIN Value in next row Sensitive      SENSITIVE<=16    PIP/TAZO Value in next row Sensitive      SENSITIVE<=4    * >=100,000 COLONIES/mL ESCHERICHIA COLI  MRSA PCR Screening     Status: None   Collection Time: 05/27/15  7:36 PM  Result Value Ref Range Status   MRSA by PCR NEGATIVE NEGATIVE Final    Comment:        The GeneXpert MRSA Assay (FDA approved for NASAL specimens only), is one component of a comprehensive MRSA colonization surveillance program. It is not intended to diagnose MRSA infection nor to guide or monitor treatment for MRSA infections.   Body fluid culture     Status: None (Preliminary  result)   Collection Time: 05/31/15  8:00 AM  Result Value Ref Range Status   Specimen Description FLUID WRIST RIGHT SYNOVIAL  Final   Special Requests Normal  Final   Gram Stain   Final  ABUNDANT WBC PRESENT,BOTH PMN AND MONONUCLEAR NO ORGANISMS SEEN    Culture   Final    MODERATE SALMONELLA SPECIES CRITICAL RESULT CALLED TO, READ BACK BY AND VERIFIED WITH: A AHATSAWO,RN AT 1121 05/29/15 BY L BENFIELD CRITICAL RESULT CALLED TO, READ BACK BY AND VERIFIED WITH: NOTIFIED DR. 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 I will continue oral trimethoprim sulfamethoxazole. Dr. Grandville Silos plans surgical debridement of her right wrist tomorrow.  PLAN: 1. Trimethoprim sulfamethoxazole one single strength tablet twice a day for 5 more weeks  Michel Bickers, MD [facility] for [place] [phone] pager   [phone] cell 06/04/2015, 2:23 PM

## 2015-06-04 NOTE — Progress Notes (Signed)
[facility] PROGRESS NOTE  [name] IPJ:[id number] DOB: [birth year] DOA: 05/27/2015 PCP: Leotis Shames, MD  Brief narrative 74 year old African-American female with history of CML, ITP, anemia of chronic disease, hypertension, hyperlipidemia, chronic diastolic CHF who was taken to [facility] ED by her son for altered mental status. (Patient was confused, inattentive and drowsy and unable to take her medications on the day of presentation). She was also found to be febrile and incontinent. Patient was found to be in acute hypoxic respiratory failure with pulmonary edema initially requiring BiPAP on admission and was found to be septic with Escherichia coli UTI, Salmonella bacteremia and septic right wrist joint. She was also found to have worsening thrombocytopenia and leukocytosis. Oncology, hand surgeon and ID consulted.  Assessment/Plan: Sepsis secondary to Escherichia coli UTI and Salmonella bacteremia Currently afebrile. Continue empiric Bactrim (1 single strength tablet twice daily for 5 more weeks) as per ID recommendation.  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 Synovial fluid count of 22k with negative culture. Dr. Grandville Silos plans on surgically debulking the infection. OR  in the morning.  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 Hematology following. Recommends to continue prednisone 60 mg daily as inpatient. No role  for IVIG at this time.  ecoli UTI  on bactrim   Leukocytosis Secondary to myeloproliferative disorder and underlying sepsis. Continue to monitor.  Anemia Combination of iron deficiency and anemia of chronic disease. Hemoglobin stable after 2u prbc on 8/19.  Acute encephalopathy Likely secondary to sepsis and now resolved.  CKD stage 3 Renal fn at baseline.  DVT prophylaxis:  SCD  Diet: regular   Code Status: full Code Family Communication:none at bedside Disposition Plan: home once  stable   Consultants:  Hand surgery   ID   hematology  Procedures:  2-D echo  MRI right wrist  Head CT  Antibiotics:  Bactrim  HPI/Subjective: Patient seen and examined. Denies any symptoms. Remains afebrile. No overnight issues.  Objective: Filed Vitals:   06/04/15 0821  BP: 160/85  Pulse: 65  Temp: 98.4 F (36.9 C)  Resp: 17    Intake/Output Summary (Last 24 hours) at 06/04/15 1443 Last data filed at 06/04/15 1301  Gross per 24 hour  Intake   1080 ml  Output    200 ml  Net    880 ml   Filed Weights   05/30/15 1947 05/31/15 1900 06/01/15 0500  Weight: 55.792 kg (123 lb) 55.43 kg (122 lb 3.2 oz) 56 kg (123 lb 7.3 oz)    Exam:   General: Elderly female in no acute distress  HEENT: No pallor, moist oral mucosa  Chest: Clear to auscultation bilaterally  CVS: Normal S1 and S2, no gallop  GI: Soft, nondistended, nontender, bowel sounds present   musculoskeletal: Warm,deformity of rt wrist, non tender  CNS: Alert and oriented  Data Reviewed: Basic Metabolic Panel:  Recent Labs Lab 05/31/15 0605 06/01/15 0227 06/02/15 1009 06/03/15 0318 06/04/15 0506  NA 147* 142 137 137 137  K 3.9 3.9 4.1 4.1 4.1  CL 114* 107 103 103 104  CO2 27 25 21* 22 25  GLUCOSE 116* 141* 103* 118* 88  BUN 56* 52* 60* 56* 47*  CREATININE 1.68* 1.72* 1.65* 1.61* 1.45*  CALCIUM 8.6* 8.4* 8.5* 8.3* 8.4*   Liver Function Tests:  Recent Labs Lab 05/29/15 0536  AST 27  ALT 21  ALKPHOS 70  BILITOT 1.2  PROT 7.5  ALBUMIN  2.5*   No results for input(s): LIPASE, AMYLASE in the last 168 hours. No results for input(s): AMMONIA in the last 168 hours. CBC:  Recent Labs Lab 05/29/15 0536  06/01/15 0227 06/02/15 1009 06/02/15 1720 06/03/15 0318 06/04/15 0506  WBC 34.6*  < > 31.3* 31.0* 25.0* 31.8* 34.1*  NEUTROABS 27.3*  --   --   --  20.9*  --   --   HGB 8.5*  < > 7.3* 11.1* 11.1* 11.6* 11.8*  HCT 26.3*  < > 23.3* 33.3* 33.1* 34.0* 36.5  MCV 77.8*  < >  81.8 80.2 79.2 79.3 82.0  PLT 23*  < > 22* 14* 31* 37* 51*  < > = values in this interval not displayed. Cardiac Enzymes:  Recent Labs Lab 05/28/15 1515  TROPONINI 0.03   BNP (last 3 results)  Recent Labs  05/28/15 0226  BNP 515.4*    ProBNP (last 3 results) No results for input(s): PROBNP in the last 8760 hours.  CBG:  Recent Labs Lab 05/31/15 1157  GLUCAP 99    Recent Results (from the past 240 hour(s))  Blood Culture (routine x 2)     Status: None (Preliminary result)   Collection Time: 05/27/15  4:16 PM  Result Value Ref Range Status   Specimen Description BLOOD RIGHT FOREARM  Final   Special Requests   Final    BOTTLES DRAWN AEROBIC AND ANAEROBIC  AER 5CC ANA 3CC   Culture  Setup Time   Final    GRAM NEGATIVE RODS IN BOTH AEROBIC AND ANAEROBIC BOTTLES CRITICAL RESULT CALLED TO, READ BACK BY AND VERIFIED WITH: Mali VAN DYKE AT 8242 05/28/15.PMH CONFIRMED BY MPG    Culture   Final    [place] SPECIES IN BOTH AEROBIC AND ANAEROBIC BOTTLES REFERRED TO [facility] IN [place], [place] IDENTIFICATION/CONFIRMATION Results Called to: [place] AT 3536 05/30/15 DV    Report Status PENDING  Incomplete   Organism ID, Bacteria [place] SPECIES  Final      Susceptibility   Salmonella species - MIC*    AMPICILLIN <=2 SENSITIVE Sensitive     LEVOFLOXACIN <=0.12 SENSITIVE Sensitive     TRIMETH/SULFA <=20 SENSITIVE Sensitive     * [place] SPECIES  Blood Culture (routine x 2)     Status: None   Collection Time: 05/27/15  4:16 PM  Result Value Ref Range Status   Specimen Description BLOOD RIGHT HAND  Final   Special Requests BOTTLES DRAWN AEROBIC AND ANAEROBIC  1CC  Final   Culture  Setup Time   Final    GRAM NEGATIVE RODS IN BOTH AEROBIC AND ANAEROBIC BOTTLES CRITICAL RESULT CALLED TO, READ BACK BY AND VERIFIED WITH: Mali VAN DYKE AT 1443 05/28/15.PMH CONFIRMED BY MPG CRITICAL VALUE NOTED.  VALUE IS CONSISTENT WITH PREVIOUSLY  REPORTED AND CALLED VALUE.    Culture   Final    [place] SPECIES IN BOTH AEROBIC AND ANAEROBIC BOTTLES PREVIOUSLY CALLED REFERRED TO [facility] IN [place], [place] FOR IDENTIFICATION/CONFIRMATION    Report Status 05/30/2015 FINAL  Final   Organism ID, Bacteria [place] SPECIES  Final      Susceptibility   Salmonella species - MIC*    AMPICILLIN <=2 SENSITIVE Sensitive     LEVOFLOXACIN <=0.12 SENSITIVE Sensitive     TRIMETH/SULFA <=20 SENSITIVE Sensitive     * [place] SPECIES  Urine culture     Status: None   Collection Time: 05/27/15  4:53 PM  Result Value  Ref Range Status   Specimen Description URINE, RANDOM  Final   Special Requests NONE  Final   Culture >=100,000 COLONIES/mL ESCHERICHIA COLI  Final   Report Status 05/29/2015 FINAL  Final   Organism ID, Bacteria ESCHERICHIA COLI  Final      Susceptibility   Escherichia coli - MIC*    AMPICILLIN <=2 SENSITIVE Sensitive     CEFTAZIDIME <=1 SENSITIVE Sensitive     CEFAZOLIN <=4 SENSITIVE Sensitive     CEFTRIAXONE <=1 SENSITIVE Sensitive     CIPROFLOXACIN <=0.25 SENSITIVE Sensitive     GENTAMICIN <=1 SENSITIVE Sensitive     IMIPENEM <=0.25 SENSITIVE Sensitive     TRIMETH/SULFA <=20 SENSITIVE Sensitive     NITROFURANTOIN Value in next row Sensitive      SENSITIVE<=16    PIP/TAZO Value in next row Sensitive      SENSITIVE<=4    * >=100,000 COLONIES/mL ESCHERICHIA COLI  MRSA PCR Screening     Status: None   Collection Time: 05/27/15  7:36 PM  Result Value Ref Range Status   MRSA by PCR NEGATIVE NEGATIVE Final    Comment:        The GeneXpert MRSA Assay (FDA approved for NASAL specimens only), is one component of a comprehensive MRSA colonization surveillance program. It is not intended to diagnose MRSA infection nor to guide or monitor treatment for MRSA infections.   Body fluid culture     Status: None (Preliminary result)   Collection Time: 05/31/15  8:00 AM  Result Value Ref  Range Status   Specimen Description FLUID WRIST RIGHT SYNOVIAL  Final   Special Requests Normal  Final   Gram Stain   Final    ABUNDANT WBC PRESENT,BOTH PMN AND MONONUCLEAR NO ORGANISMS SEEN    Culture   Final    MODERATE [place] SPECIES CRITICAL RESULT CALLED TO, READ BACK BY AND VERIFIED WITH: A AHATSAWO,RN AT 1121 05/29/15 BY L BENFIELD CRITICAL RESULT CALLED TO, READ BACK BY AND VERIFIED WITH: NOTIFIED DR. Megan [place]on OF [place] ON [id number] AT [place] NOTIFIED FAXED COPY ON [id number] SENDING TO STATE FOR SEROTYPING    Report Status PENDING  Incomplete   Organism ID, Bacteria [place] SPECIES  Final      Susceptibility   Salmonella species - MIC*    AMPICILLIN <=2 SENSITIVE Sensitive     LEVOFLOXACIN <=0.12 SENSITIVE Sensitive     TRIMETH/SULFA <=20 SENSITIVE Sensitive     * MODERATE [place] SPECIES     Studies: No results found.  Scheduled Meds: . fluticasone  1 spray Each Nare Daily  . folic acid  1 mg Oral Daily  . furosemide  40 mg Oral Daily  . lidocaine  1 patch Transdermal Q24H  . metoprolol tartrate  25 mg Oral BID  . nystatin  5 mL Oral QID  . oxybutynin  5 mg Oral Daily  . pantoprazole  40 mg Oral BID AC  . predniSONE  60 mg Oral Q breakfast  . sulfamethoxazole-trimethoprim  1 tablet Oral Q12H   Continuous Infusions:     Time spent: 25 minutes    [name]  [facility] Pager [phone] If 7PM-7AM, please contact night-coverage at [email or web address], password [facility] 06/04/2015, 2:43 PM  LOS: 8 days

## 2015-06-04 NOTE — Clinical Social Work Placement (Signed)
   CLINICAL SOCIAL WORK PLACEMENT  NOTE  Date:  06/04/2015  Patient Details  Name: [name] MRN: [id number] Date of Birth: [birth year]  Clinical Social Work is seeking post-discharge placement for this patient at the [place] level of care (*CSW will initial, date and re-position this form in  chart as items are completed):  Yes   Patient/family provided with [place] Work Department's list of facilities offering this level of care within the geographic area requested by the patient (or if unable, by the patient's family).  Yes   Patient/family informed of their freedom to choose among providers that offer the needed level of care, that participate in Medicare, Medicaid or managed care program needed by the patient, have an available bed and are willing to accept the patient.  Yes   Patient/family informed of [facility]'s ownership interest in [facility] and [facility], as well as of the fact that they are under no obligation to receive care at these facilities.  PASRR submitted to EDS on 06/01/15     PASRR number received on 06/01/15     Existing PASRR number confirmed on       FL2 transmitted to all facilities in geographic area requested by pt/family on 06/01/15     FL2 transmitted to all facilities within larger geographic area on       Patient informed that his/her managed care company has contracts with or will negotiate with certain facilities, including the following:         06/04/15 - Patient/family informed of bed offers received.  Patient given skilled facility list with responses. She will talk with her son and let CSW know facility decision on 06/05/15.  Patient chooses bed at       Physician recommends and patient chooses bed at      Patient to be transferred to   on  .  Patient to be transferred to facility by       Patient family notified on   of transfer.  Name of family member notified:         PHYSICIAN Please sign FL2     Additional Comment:    _______________________________________________ Sable Feil, LCSW 06/04/2015, 4:27 PM

## 2015-06-04 NOTE — Care Management Important Message (Signed)
Important Message  Patient Details  Name: [name] MRN: [id number] Date of Birth: 05-02-1941   Medicare Important Message Given:  Yes-third notification given    Erenest Rasher, RN 06/04/2015, 3:14 PM

## 2015-06-05 ENCOUNTER — Inpatient Hospital Stay (HOSPITAL_COMMUNITY): Payer: Medicare Other | Admitting: Certified Registered Nurse Anesthetist

## 2015-06-05 ENCOUNTER — Inpatient Hospital Stay: Payer: Medicare Other

## 2015-06-05 ENCOUNTER — Encounter (HOSPITAL_COMMUNITY)
Admission: AD | Disposition: A | Discharge status: Skilled Nursing Facility | Payer: Self-pay | Source: Other Acute Inpatient Hospital | Attending: Internal Medicine

## 2015-06-05 ENCOUNTER — Inpatient Hospital Stay: Payer: Medicare Other | Admitting: Family Medicine

## 2015-06-05 DIAGNOSIS — M009 Pyogenic arthritis, unspecified: Secondary | ICD-10-CM

## 2015-06-05 DIAGNOSIS — Z9889 Other specified postprocedural states: Secondary | ICD-10-CM

## 2015-06-05 DIAGNOSIS — D693 Immune thrombocytopenic purpura: Secondary | ICD-10-CM | POA: Diagnosis not present

## 2015-06-05 DIAGNOSIS — I129 Hypertensive chronic kidney disease with stage 1 through stage 4 chronic kidney disease, or unspecified chronic kidney disease: Secondary | ICD-10-CM | POA: Diagnosis not present

## 2015-06-05 DIAGNOSIS — L089 Local infection of the skin and subcutaneous tissue, unspecified: Secondary | ICD-10-CM | POA: Diagnosis not present

## 2015-06-05 DIAGNOSIS — C9310 Chronic myelomonocytic leukemia not having achieved remission: Secondary | ICD-10-CM

## 2015-06-05 DIAGNOSIS — N183 Chronic kidney disease, stage 3 (moderate): Secondary | ICD-10-CM | POA: Diagnosis not present

## 2015-06-05 HISTORY — PX: INCISION AND DRAINAGE: SHX5863

## 2015-06-05 LAB — CBC
HEMATOCRIT: 35.5 % — AB (ref 36.0–46.0)
Hemoglobin: 11.6 g/dL — ABNORMAL LOW (ref 12.0–15.0)
MCH: 26.9 pg (ref 26.0–34.0)
MCHC: 32.7 g/dL (ref 30.0–36.0)
MCV: 82.2 fL (ref 78.0–100.0)
PLATELETS: 64 10*3/uL — AB (ref 150–400)
RBC: 4.32 MIL/uL (ref 3.87–5.11)
RDW: 20.7 % — ABNORMAL HIGH (ref 11.5–15.5)
WBC: 35.9 10*3/uL — AB (ref 4.0–10.5)

## 2015-06-05 LAB — SURGICAL PCR SCREEN
MRSA, PCR: NEGATIVE
STAPHYLOCOCCUS AUREUS: NEGATIVE

## 2015-06-05 LAB — BASIC METABOLIC PANEL
ANION GAP: 9 (ref 5–15)
BUN: 44 mg/dL — ABNORMAL HIGH (ref 6–20)
CO2: 22 mmol/L (ref 22–32)
Calcium: 8.5 mg/dL — ABNORMAL LOW (ref 8.9–10.3)
Chloride: 105 mmol/L (ref 101–111)
Creatinine, Ser: 1.45 mg/dL — ABNORMAL HIGH (ref 0.44–1.00)
GFR, EST AFRICAN AMERICAN: 41 mL/min — AB (ref >60)
GFR, EST NON AFRICAN AMERICAN: 35 mL/min — AB (ref >60)
GLUCOSE: 92 mg/dL (ref 65–99)
POTASSIUM: 4.2 mmol/L (ref 3.5–5.1)
Sodium: 136 mmol/L (ref 135–145)

## 2015-06-05 SURGERY — INCISION AND DRAINAGE
Anesthesia: Monitor Anesthesia Care | Site: Wrist | Laterality: Right

## 2015-06-05 MED ORDER — SODIUM CHLORIDE 0.9 % IR SOLN
Status: DC | PRN
  Administered 2015-06-05: 1000 mL

## 2015-06-05 MED ORDER — LIDOCAINE-EPINEPHRINE 1 %-1:100000 IJ SOLN
INTRAMUSCULAR | Status: DC | PRN
  Administered 2015-06-05: 20 mL

## 2015-06-05 MED ORDER — ONDANSETRON HCL 4 MG/2ML IJ SOLN: 4.0000 mg | Freq: Once | INTRAMUSCULAR | Status: DC | PRN

## 2015-06-05 MED ORDER — OXYCODONE HCL 5 MG PO TABS: 5.0000 mg | ORAL_TABLET | Freq: Once | ORAL | Status: DC | PRN

## 2015-06-05 MED ORDER — FENTANYL CITRATE (PF) 250 MCG/5ML IJ SOLN
INTRAMUSCULAR | Status: AC
  Filled 2015-06-05: qty 5

## 2015-06-05 MED ORDER — FENTANYL CITRATE (PF) 100 MCG/2ML IJ SOLN: 25.0000 ug | INTRAMUSCULAR | Status: DC | PRN

## 2015-06-05 MED ORDER — FENTANYL CITRATE (PF) 100 MCG/2ML IJ SOLN
INTRAMUSCULAR | Status: DC | PRN
  Administered 2015-06-05 (×3): 50 ug via INTRAVENOUS

## 2015-06-05 MED ORDER — LACTATED RINGERS IV SOLN
INTRAVENOUS | Status: DC | PRN
  Administered 2015-06-05: 07:00:00 via INTRAVENOUS

## 2015-06-05 MED ORDER — CEFAZOLIN SODIUM 1-5 GM-% IV SOLN
INTRAVENOUS | Status: DC | PRN
  Administered 2015-06-05: 1 g via INTRAVENOUS

## 2015-06-05 MED ORDER — PROPOFOL INFUSION 10 MG/ML OPTIME
INTRAVENOUS | Status: DC | PRN
  Administered 2015-06-05: 50 ug/kg/min via INTRAVENOUS

## 2015-06-05 MED ORDER — PROPOFOL 10 MG/ML IV BOLUS
INTRAVENOUS | Status: AC
  Filled 2015-06-05: qty 20

## 2015-06-05 MED ORDER — SODIUM CHLORIDE 0.9 % IJ SOLN
INTRAMUSCULAR | Status: DC | PRN
  Administered 2015-06-05 (×2): 10 mL

## 2015-06-05 MED ORDER — OXYCODONE HCL 5 MG/5ML PO SOLN: 5.0000 mg | Freq: Once | ORAL | Status: DC | PRN

## 2015-06-05 MED ORDER — LIDOCAINE-EPINEPHRINE 1 %-1:100000 IJ SOLN
INTRAMUSCULAR | Status: AC
  Filled 2015-06-05: qty 1

## 2015-06-05 SURGICAL SUPPLY — 54 items
BNDG COHESIVE 4X5 TAN STRL (GAUZE/BANDAGES/DRESSINGS) ×3 IMPLANT
BNDG ESMARK 4X9 LF (GAUZE/BANDAGES/DRESSINGS) ×3 IMPLANT
BNDG GAUZE ELAST 4 BULKY (GAUZE/BANDAGES/DRESSINGS) ×3 IMPLANT
CHLORAPREP W/TINT 26ML (MISCELLANEOUS) ×3 IMPLANT
COVER SURGICAL LIGHT HANDLE (MISCELLANEOUS) ×3 IMPLANT
CUFF TOURNIQUET SINGLE 18IN (TOURNIQUET CUFF) ×3 IMPLANT
CUFF TOURNIQUET SINGLE 24IN (TOURNIQUET CUFF) IMPLANT
DRAPE SURG 17X23 STRL (DRAPES) ×3 IMPLANT
DRSG ADAPTIC 3X8 NADH LF (GAUZE/BANDAGES/DRESSINGS) ×3 IMPLANT
ELECT REM PT RETURN 9FT ADLT (ELECTROSURGICAL) ×3
ELECTRODE REM PT RTRN 9FT ADLT (ELECTROSURGICAL) ×1 IMPLANT
EVACUATOR 1/8 PVC DRAIN (DRAIN) IMPLANT
GAUZE SPONGE 4X4 12PLY STRL (GAUZE/BANDAGES/DRESSINGS) IMPLANT
GLOVE BIO SURGEON STRL SZ 6.5 (GLOVE) ×2 IMPLANT
GLOVE BIO SURGEON STRL SZ7.5 (GLOVE) ×3 IMPLANT
GLOVE BIO SURGEON STRL SZ8 (GLOVE) ×3 IMPLANT
GLOVE BIO SURGEONS STRL SZ 6.5 (GLOVE) ×1
GLOVE BIOGEL PI IND STRL 6.5 (GLOVE) ×1 IMPLANT
GLOVE BIOGEL PI IND STRL 7.0 (GLOVE) ×1 IMPLANT
GLOVE BIOGEL PI IND STRL 8 (GLOVE) ×1 IMPLANT
GLOVE BIOGEL PI IND STRL 8.5 (GLOVE) ×1 IMPLANT
GLOVE BIOGEL PI INDICATOR 6.5 (GLOVE) ×2
GLOVE BIOGEL PI INDICATOR 7.0 (GLOVE) ×2
GLOVE BIOGEL PI INDICATOR 8 (GLOVE) ×2
GLOVE BIOGEL PI INDICATOR 8.5 (GLOVE) ×2
GLOVE SURG SS PI 6.5 STRL IVOR (GLOVE) ×3 IMPLANT
GOWN STRL REUS W/ TWL LRG LVL3 (GOWN DISPOSABLE) ×3 IMPLANT
GOWN STRL REUS W/TWL LRG LVL3 (GOWN DISPOSABLE) ×6
HANDPIECE INTERPULSE COAX TIP (DISPOSABLE)
KIT BASIN OR (CUSTOM PROCEDURE TRAY) ×3 IMPLANT
KIT ROOM TURNOVER OR (KITS) ×3 IMPLANT
MANIFOLD NEPTUNE II (INSTRUMENTS) ×3 IMPLANT
NS IRRIG 1000ML POUR BTL (IV SOLUTION) ×3 IMPLANT
PACK ORTHO EXTREMITY (CUSTOM PROCEDURE TRAY) ×3 IMPLANT
PAD ARMBOARD 7.5X6 YLW CONV (MISCELLANEOUS) ×3 IMPLANT
PAD CAST 4YDX4 CTTN HI CHSV (CAST SUPPLIES) ×1 IMPLANT
PADDING CAST COTTON 4X4 STRL (CAST SUPPLIES) ×2
SET HNDPC FAN SPRY TIP SCT (DISPOSABLE) IMPLANT
SPONGE GAUZE 4X4 12PLY STER LF (GAUZE/BANDAGES/DRESSINGS) ×3 IMPLANT
SPONGE LAP 18X18 X RAY DECT (DISPOSABLE) IMPLANT
STOCKINETTE IMPERVIOUS 9X36 MD (GAUZE/BANDAGES/DRESSINGS) IMPLANT
SUT ETHILON 4 0 PS 2 18 (SUTURE) IMPLANT
SUT PROLENE 1 CT (SUTURE) IMPLANT
SUT VICRYL RAPIDE 4/0 PS 2 (SUTURE) ×3 IMPLANT
SYRINGE 10CC LL (SYRINGE) ×6 IMPLANT
TOWEL OR 17X24 6PK STRL BLUE (TOWEL DISPOSABLE) ×3 IMPLANT
TOWEL OR 17X26 10 PK STRL BLUE (TOWEL DISPOSABLE) ×3 IMPLANT
TUBE ANAEROBIC SPECIMEN COL (MISCELLANEOUS) IMPLANT
TUBE CONNECTING 12'X1/4 (SUCTIONS) ×1
TUBE CONNECTING 12X1/4 (SUCTIONS) ×2 IMPLANT
TUBING CYSTO DISP (UROLOGICAL SUPPLIES) ×3 IMPLANT
UNDERPAD 30X30 INCONTINENT (UNDERPADS AND DIAPERS) ×3 IMPLANT
WATER STERILE IRR 1000ML POUR (IV SOLUTION) IMPLANT
YANKAUER SUCT BULB TIP NO VENT (SUCTIONS) ×3 IMPLANT

## 2015-06-05 NOTE — Anesthesia Preprocedure Evaluation (Addendum)
Anesthesia Evaluation  Patient identified by MRN, date of birth, ID band Patient awake    Reviewed: Allergy & Precautions, NPO status , Patient's Chart, lab work & pertinent test results  Airway Mallampati: II  TM Distance: >3 FB Neck ROM: Full    Dental  (+) Teeth Intact, Dental Advisory Given   Pulmonary  breath sounds clear to auscultation        Cardiovascular hypertension, Rhythm:Regular Rate:Normal     Neuro/Psych    GI/Hepatic   Endo/Other    Renal/GU      Musculoskeletal   Abdominal   Peds  Hematology   Anesthesia Other Findings   Reproductive/Obstetrics                            Anesthesia Physical Anesthesia Plan  ASA: III  Anesthesia Plan: General   Post-op Pain Management:    Induction: Intravenous  Airway Management Planned: Oral ETT  Additional Equipment:   Intra-op Plan:   Post-operative Plan: Extubation in OR  Informed Consent:   Dental advisory given  Plan Discussed with: CRNA and Anesthesiologist  Anesthesia Plan Comments:         Anesthesia Quick Evaluation

## 2015-06-05 NOTE — Progress Notes (Signed)
Patient refusing bed alarm and chair alarm this evening shift.  Re-educated patient on importance of utilizing bed alarm; patient still refused.  Non-skid socks on.  Patient insists on sleeping in recliner tonight. Emphasized use of call bell if needed to use the bathroom; verbalized understanding.  Will continue to monitor patient.

## 2015-06-05 NOTE — Progress Notes (Signed)
Patient ID: [name], female   DOB: 09-21-1941, 74 y.o.   MRN: [id number]         [facility]    Date of Admission:  05/27/2015   Total days of antibiotics 10        Day 5 trimethoprim-sulfamethoxazole  Principal Problem:   Salmonella bacteremia Active Problems:   Sepsis   Right wrist deformity   Acute respiratory failure with hypoxemia   Acute respiratory failure   Lethargic   CMML (chronic myelomonocytic leukemia)   Septic arthritis of hand, right   . fluticasone  1 spray Each Nare Daily  . folic acid  1 mg Oral Daily  . furosemide  40 mg Oral Daily  . lidocaine  1 patch Transdermal Q24H  . metoprolol tartrate  25 mg Oral BID  . nystatin  5 mL Oral QID  . oxybutynin  5 mg Oral Daily  . pantoprazole  40 mg Oral BID AC  . predniSONE  60 mg Oral Q breakfast  . sulfamethoxazole-trimethoprim  1 tablet Oral Q12H    SUBJECTIVE: She is feeling much better.   Review of Systems: Pertinent items are noted in HPI.  Past Medical History  Diagnosis Date  . Hypertension   . Hyperlipidemia     Social History  Substance Use Topics  . Smoking status: Never Smoker   . Smokeless tobacco: None  . Alcohol Use: No    History reviewed. No pertinent family history. Allergies  Allergen Reactions  . Aspirin Rash    OBJECTIVE: Filed Vitals:   06/05/15 0841 06/05/15 0845 06/05/15 0851 06/05/15 1115  BP: 160/75  146/86 146/77  Pulse:  80 77 63  Temp: 97.9 F (36.6 C)  97.1 F (36.2 C) 97.7 F (36.5 C)  TempSrc:    Oral  Resp: 22 23 21 20   Height:      Weight:      SpO2:  96% 95% 99%   Body mass index is 23.2 kg/(m^2).  General: she is sitting up in a chair eating Jell-O Lungs: clear Cor: regular S1 and S2 with no murmur Right wrist in postoperative dressing  Lab Results Lab Results  Component Value Date   WBC 35.9* 06/05/2015   HGB 11.6* 06/05/2015   HCT 35.5* 06/05/2015   MCV 82.2 06/05/2015   PLT 64* 06/05/2015    Lab  Results  Component Value Date   CREATININE 1.45* 06/05/2015   BUN 44* 06/05/2015   NA 136 06/05/2015   K 4.2 06/05/2015   CL 105 06/05/2015   CO2 22 06/05/2015    Lab Results  Component Value Date   ALT 21 05/29/2015   AST 27 05/29/2015   ALKPHOS 70 05/29/2015   BILITOT 1.2 05/29/2015     Microbiology: Recent Results (from the past 240 hour(s))  Blood Culture (routine x 2)     Status: None (Preliminary result)   Collection Time: 05/27/15  4:16 PM  Result Value Ref Range Status   Specimen Description BLOOD RIGHT FOREARM  Final   Special Requests   Final    BOTTLES DRAWN AEROBIC AND ANAEROBIC  AER 5CC ANA 3CC   Culture  Setup Time   Final    GRAM NEGATIVE RODS IN BOTH AEROBIC AND ANAEROBIC BOTTLES CRITICAL RESULT CALLED TO, READ BACK BY AND VERIFIED WITH: Mali VAN DYKE AT 8676 05/28/15.PMH CONFIRMED BY MPG    Culture   Final    [place] SPECIES IN BOTH AEROBIC AND ANAEROBIC BOTTLES  REFERRED TO [facility] IN [place] IDENTIFICATION/CONFIRMATION Results Called to: [place] AT 3976 05/30/15 DV    Report Status PENDING  Incomplete   Organism ID, Bacteria [place] SPECIES  Final      Susceptibility   Salmonella species - MIC*    AMPICILLIN <=2 SENSITIVE Sensitive     LEVOFLOXACIN <=0.12 SENSITIVE Sensitive     TRIMETH/SULFA <=20 SENSITIVE Sensitive     * [place] SPECIES  Blood Culture (routine x 2)     Status: None   Collection Time: 05/27/15  4:16 PM  Result Value Ref Range Status   Specimen Description BLOOD RIGHT HAND  Final   Special Requests BOTTLES DRAWN AEROBIC AND ANAEROBIC  1CC  Final   Culture  Setup Time   Final    GRAM NEGATIVE RODS IN BOTH AEROBIC AND ANAEROBIC BOTTLES CRITICAL RESULT CALLED TO, READ BACK BY AND VERIFIED WITH: Mali VAN DYKE AT 7341 05/28/15.PMH CONFIRMED BY MPG CRITICAL VALUE NOTED.  VALUE IS CONSISTENT WITH PREVIOUSLY REPORTED AND CALLED VALUE.    Culture   Final    [place]  SPECIES IN BOTH AEROBIC AND ANAEROBIC BOTTLES PREVIOUSLY CALLED REFERRED TO [facility] IN [place], [place] FOR IDENTIFICATION/CONFIRMATION    Report Status 05/30/2015 FINAL  Final   Organism ID, Bacteria [place] SPECIES  Final      Susceptibility   Salmonella species - MIC*    AMPICILLIN <=2 SENSITIVE Sensitive     LEVOFLOXACIN <=0.12 SENSITIVE Sensitive     TRIMETH/SULFA <=20 SENSITIVE Sensitive     * [place] SPECIES  Urine culture     Status: None   Collection Time: 05/27/15  4:53 PM  Result Value Ref Range Status   Specimen Description URINE, RANDOM  Final   Special Requests NONE  Final   Culture >=100,000 COLONIES/mL ESCHERICHIA COLI  Final   Report Status 05/29/2015 FINAL  Final   Organism ID, Bacteria ESCHERICHIA COLI  Final      Susceptibility   Escherichia coli - MIC*    AMPICILLIN <=2 SENSITIVE Sensitive     CEFTAZIDIME <=1 SENSITIVE Sensitive     CEFAZOLIN <=4 SENSITIVE Sensitive     CEFTRIAXONE <=1 SENSITIVE Sensitive     CIPROFLOXACIN <=0.25 SENSITIVE Sensitive     GENTAMICIN <=1 SENSITIVE Sensitive     IMIPENEM <=0.25 SENSITIVE Sensitive     TRIMETH/SULFA <=20 SENSITIVE Sensitive     NITROFURANTOIN Value in next row Sensitive      SENSITIVE<=16    PIP/TAZO Value in next row Sensitive      SENSITIVE<=4    * >=100,000 COLONIES/mL ESCHERICHIA COLI  MRSA PCR Screening     Status: None   Collection Time: 05/27/15  7:36 PM  Result Value Ref Range Status   MRSA by PCR NEGATIVE NEGATIVE Final    Comment:        The GeneXpert MRSA Assay (FDA approved for NASAL specimens only), is one component of a comprehensive MRSA colonization surveillance program. It is not intended to diagnose MRSA infection nor to guide or monitor treatment for MRSA infections.   Body fluid culture     Status: None (Preliminary result)   Collection Time: 05/31/15  8:00 AM  Result Value Ref Range Status   Specimen Description FLUID WRIST RIGHT SYNOVIAL   Final   Special Requests Normal  Final   Gram Stain   Final    ABUNDANT WBC PRESENT,BOTH PMN AND MONONUCLEAR NO ORGANISMS SEEN    Culture  Final    MODERATE [place]A SPECIES CRITICAL RESULT CALLED TO, READ BACK BY AND VERIFIED WITH: A AHATSAWO,RN AT 1121 05/29/15 BY L BENFIELD CRITICAL RESULT CALLED TO, READ BACK BY AND VERIFIED WITH: NOTIFIED DR. Megan Salon OF [place] ON [id number] AT [place] NOTIFIED FAXED COPY ON [id number] SENDING TO STATE FOR SEROTYPING    Report Status PENDING  Incomplete   Organism ID, Bacteria [place] SPECIES  Final      Susceptibility   Salmonella species - MIC*    AMPICILLIN <=2 SENSITIVE Sensitive     LEVOFLOXACIN <=0.12 SENSITIVE Sensitive     TRIMETH/SULFA <=20 SENSITIVE Sensitive     * MODERATE [place] SPECIES  Surgical pcr screen     Status: None   Collection Time: 06/04/15 10:50 PM  Result Value Ref Range Status   MRSA, PCR NEGATIVE NEGATIVE Final   Staphylococcus aureus NEGATIVE NEGATIVE Final    Comment:        The Xpert SA Assay (FDA approved for NASAL specimens in patients over 42 years of age), is one component of a comprehensive surveillance program.  Test performance has been validated by [facility] [place] [facility] for patients greater than or equal to 42 year old. It is not intended to diagnose infection nor to guide or monitor treatment.      ASSESSMENT: She underwent incision and drainage of her right wrist septic arthritis/tenosynovitis this morning.  PLAN: 1. Trimethoprim sulfamethoxazole one single strength tablet twice a day for a minimum of weeks total  Michel Bickers, MD [facility] [place] [phone] pager   [phone] cell 06/05/2015, 4:22 PM

## 2015-06-05 NOTE — Progress Notes (Signed)
[facility] PROGRESS NOTE  [name] KGM:[id number] DOB: [birth year] DOA: 05/27/2015 PCP: Leotis Shames, MD  Brief narrative 74 year old African-American female with history of CML, ITP, anemia of chronic disease, hypertension, hyperlipidemia, chronic diastolic CHF who was taken to [facility] ED by her son for altered mental status. (Patient was confused, inattentive and drowsy and unable to take her medications on the day of presentation). She was also found to be febrile and incontinent. Patient was found to be in acute hypoxic respiratory failure with pulmonary edema initially requiring BiPAP on admission and was found to be septic with Escherichia coli UTI, Salmonella bacteremia and septic right wrist joint. She was also found to have worsening thrombocytopenia and leukocytosis. Oncology, hand surgeon and ID consulted.  Assessment/Plan: Sepsis secondary to Escherichia coli UTI and Salmonella bacteremia Resolved.  Continue empiric Bactrim (1 single strength tablet twice daily for 5 more weeks) as per ID recommendation.  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 Synovial fluid count of 22k with negative culture. Patient taken to or today with arthrotomy and neck she is none debridement of the right distal radial ulnar joint with fourth compartment radial tenosynovectomy. Vision tolerated procedure well.  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 Hematology following. Recommends to continue prednisone 60 mg daily as inpatient. No role  for IVIG at this time.  ecoli UTI  on bactrim   Leukocytosis Secondary to myeloproliferative disorder and underlying sepsis. Continue to monitor.  Anemia Combination of iron deficiency and anemia of chronic disease. Hemoglobin stable after 2u prbc on 8/19.  Acute encephalopathy Likely secondary to sepsis and now resolved.  CKD stage 3 Renal fn at baseline.  DVT prophylaxis:  SCD  Diet:  regular   Code Status: full Code Family Communication:daughter at bedside Disposition Plan: Skilled nursing facility possibly on 8/24 or 8/25 after wound evaluated by surgery   Consultants:  Hand surgery   ID   hematology  Procedures:  2-D echo  MRI right wrist  Head CT  Antibiotics:  Bactrim  HPI/Subjective: Patient seen and examined after returning from OR. Denies any pain. No overnight issues.  Objective: Filed Vitals:   06/05/15 1115  BP: 146/77  Pulse: 63  Temp: 97.7 F (36.5 C)  Resp: 20    Intake/Output Summary (Last 24 hours) at 06/05/15 1148 Last data filed at 06/05/15 0840  Gross per 24 hour  Intake   1485 ml  Output    300 ml  Net   1185 ml   Filed Weights   05/31/15 1900 06/01/15 0500 06/05/15 0439  Weight: 55.43 kg (122 lb 3.2 oz) 56 kg (123 lb 7.3 oz) 63.24 kg (139 lb 6.7 oz)    Exam:   General: no acute distress  HEENT:moist oral mucosa  Chest: Clear to auscultation bilaterally  CVS: Normal S1 and S2, no gallop  GI: Soft, nondistended, nontender, bowel sounds present   musculoskeletal: Warm, dressing over right wrist, nontender  CNS: Alert and oriented  Data Reviewed: Basic Metabolic Panel:  Recent Labs Lab 06/01/15 0227 06/02/15 1009 06/03/15 0318 06/04/15 0506 06/05/15 0619  NA 142 137 137 137 136  K 3.9 4.1 4.1 4.1 4.2  CL 107 103 103 104 105  CO2 25 21* 22 25 22   GLUCOSE 141* 103* 118* 88 92  BUN 52* 60* 56* 47* 44*  CREATININE 1.72* 1.65* 1.61* 1.45* 1.45*  CALCIUM 8.4* 8.5* 8.3* 8.4* 8.5*   Liver Function Tests: No results  for input(s): AST, ALT, ALKPHOS, BILITOT, PROT, ALBUMIN in the last 168 hours. No results for input(s): LIPASE, AMYLASE in the last 168 hours. No results for input(s): AMMONIA in the last 168 hours. CBC:  Recent Labs Lab 06/02/15 1009 06/02/15 1720 06/03/15 0318 06/04/15 0506 06/05/15 0619  WBC 31.0* 25.0* 31.8* 34.1* 35.9*  NEUTROABS  --  20.9*  --   --   --   HGB 11.1*  11.1* 11.6* 11.8* 11.6*  HCT 33.3* 33.1* 34.0* 36.5 35.5*  MCV 80.2 79.2 79.3 82.0 82.2  PLT 14* 31* 37* 51* 64*   Cardiac Enzymes: No results for input(s): CKTOTAL, CKMB, CKMBINDEX, TROPONINI in the last 168 hours. BNP (last 3 results)  Recent Labs  05/28/15 0226  BNP 515.4*    ProBNP (last 3 results) No results for input(s): PROBNP in the last 8760 hours.  CBG:  Recent Labs Lab 05/31/15 1157  GLUCAP 99    Recent Results (from the past 240 hour(s))  Blood Culture (routine x 2)     Status: None (Preliminary result)   Collection Time: 05/27/15  4:16 PM  Result Value Ref Range Status   Specimen Description BLOOD RIGHT FOREARM  Final   Special Requests   Final    BOTTLES DRAWN AEROBIC AND ANAEROBIC  AER 5CC ANA 3CC   Culture  Setup Time   Final    GRAM NEGATIVE RODS IN BOTH AEROBIC AND ANAEROBIC BOTTLES CRITICAL RESULT CALLED TO, READ BACK BY AND VERIFIED WITH: Mali VAN DYKE AT 1610 05/28/15.PMH CONFIRMED BY MPG    Culture   Final    [place] SPECIES IN BOTH AEROBIC AND ANAEROBIC BOTTLES REFERRED TO [facility] IN [place], [place] IDENTIFICATION/CONFIRMATION Results Called to: [place] AT 9604 05/30/15 DV    Report Status PENDING  Incomplete   Organism ID, Bacteria [place] SPECIES  Final      Susceptibility   Salmonella species - MIC*    AMPICILLIN <=2 SENSITIVE Sensitive     LEVOFLOXACIN <=0.12 SENSITIVE Sensitive     TRIMETH/SULFA <=20 SENSITIVE Sensitive     * [place] SPECIES  Blood Culture (routine x 2)     Status: None   Collection Time: 05/27/15  4:16 PM  Result Value Ref Range Status   Specimen Description BLOOD RIGHT HAND  Final   Special Requests BOTTLES DRAWN AEROBIC AND ANAEROBIC  1CC  Final   Culture  Setup Time   Final    GRAM NEGATIVE RODS IN BOTH AEROBIC AND ANAEROBIC BOTTLES CRITICAL RESULT CALLED TO, READ BACK BY AND VERIFIED WITH: Mali VAN DYKE AT 5409 05/28/15.PMH CONFIRMED BY MPG CRITICAL VALUE  NOTED.  VALUE IS CONSISTENT WITH PREVIOUSLY REPORTED AND CALLED VALUE.    Culture   Final    [place] SPECIES IN BOTH AEROBIC AND ANAEROBIC BOTTLES PREVIOUSLY CALLED REFERRED TO [facility] IN [place], [place] FOR IDENTIFICATION/CONFIRMATION    Report Status 05/30/2015 FINAL  Final   Organism ID, Bacteria [place] SPECIES  Final      Susceptibility   Salmonella species - MIC*    AMPICILLIN <=2 SENSITIVE Sensitive     LEVOFLOXACIN <=0.12 SENSITIVE Sensitive     TRIMETH/SULFA <=20 SENSITIVE Sensitive     * [place] SPECIES  Urine culture     Status: None   Collection Time: 05/27/15  4:53 PM  Result Value Ref Range Status   Specimen Description URINE, RANDOM  Final   Special Requests NONE  Final   Culture >=100,000 COLONIES/mL  ESCHERICHIA COLI  Final   Report Status 05/29/2015 FINAL  Final   Organism ID, Bacteria ESCHERICHIA COLI  Final      Susceptibility   Escherichia coli - MIC*    AMPICILLIN <=2 SENSITIVE Sensitive     CEFTAZIDIME <=1 SENSITIVE Sensitive     CEFAZOLIN <=4 SENSITIVE Sensitive     CEFTRIAXONE <=1 SENSITIVE Sensitive     CIPROFLOXACIN <=0.25 SENSITIVE Sensitive     GENTAMICIN <=1 SENSITIVE Sensitive     IMIPENEM <=0.25 SENSITIVE Sensitive     TRIMETH/SULFA <=20 SENSITIVE Sensitive     NITROFURANTOIN Value in next row Sensitive      SENSITIVE<=16    PIP/TAZO Value in next row Sensitive      SENSITIVE<=4    * >=100,000 COLONIES/mL ESCHERICHIA COLI  MRSA PCR Screening     Status: None   Collection Time: 05/27/15  7:36 PM  Result Value Ref Range Status   MRSA by PCR NEGATIVE NEGATIVE Final    Comment:        The GeneXpert MRSA Assay (FDA approved for NASAL specimens only), is one component of a comprehensive MRSA colonization surveillance program. It is not intended to diagnose MRSA infection nor to guide or monitor treatment for MRSA infections.   Body fluid culture     Status: None (Preliminary result)   Collection  Time: 05/31/15  8:00 AM  Result Value Ref Range Status   Specimen Description FLUID WRIST RIGHT SYNOVIAL  Final   Special Requests Normal  Final   Gram Stain   Final    ABUNDANT WBC PRESENT,BOTH PMN AND MONONUCLEAR NO ORGANISMS SEEN    Culture   Final    MODERATE [place] SPECIES CRITICAL RESULT CALLED TO, READ BACK BY AND VERIFIED WITH: A AHATSAWO,RN AT 1121 05/29/15 BY L BENFIELD CRITICAL RESULT CALLED TO, READ BACK BY AND VERIFIED WITH: NOTIFIED DR. Megan Salon OF [place] ON [id number] AT [place] NOTIFIED FAXED COPY ON [id number] SENDING TO STATE FOR SEROTYPING    Report Status PENDING  Incomplete   Organism ID, Bacteria [place] SPECIES  Final      Susceptibility   Salmonella species - MIC*    AMPICILLIN <=2 SENSITIVE Sensitive     LEVOFLOXACIN <=0.12 SENSITIVE Sensitive     TRIMETH/SULFA <=20 SENSITIVE Sensitive     * MODERATE [place] SPECIES  Surgical pcr screen     Status: None   Collection Time: 06/04/15 10:50 PM  Result Value Ref Range Status   MRSA, PCR NEGATIVE NEGATIVE Final   Staphylococcus aureus NEGATIVE NEGATIVE Final    Comment:        The Xpert SA Assay (FDA approved for NASAL specimens in patients over 61 years of age), is one component of a comprehensive surveillance program.  Test performance has been validated by [facility] for patients greater than or equal to 49 year old. It is not intended to diagnose infection nor to guide or monitor treatment.      Studies: No results found.  Scheduled Meds: . fluticasone  1 spray Each Nare Daily  . folic acid  1 mg Oral Daily  . furosemide  40 mg Oral Daily  . lidocaine  1 patch Transdermal Q24H  . metoprolol tartrate  25 mg Oral BID  . nystatin  5 mL Oral QID  . oxybutynin  5 mg Oral Daily  . pantoprazole  40 mg Oral BID AC  . predniSONE  60 mg Oral Q breakfast  . sulfamethoxazole-trimethoprim  1 tablet Oral Q12H   Continuous Infusions:     Time spent: 25  minutes    Louellen Molder  [facility] Pager [phone] If 7PM-7AM, please contact night-coverage at [email or web address], password [facility] 06/05/2015, 11:48 AM  LOS: 9 days

## 2015-06-05 NOTE — Progress Notes (Signed)
Speech Language Pathology Treatment: Dysphagia  Patient Details Name: [name] MRN: [id number] DOB: 09-Aug-1941 Today's Date: 06/05/2015 Time: 1645-1700 SLP Time Calculation (min) (ACUTE ONLY): 15 min  Assessment / Plan / Recommendation Clinical Impression  Pt was seen at bedside for possible upgrade of diet texture. Per RN, pt tolerating thin liquid, puree solids, and meds whole (1@a  time). SLP observed pt with graham cracker, and noted timely mastication and swallow, with liquid wash as needed. No overt s/s aspiration with solid or thin liquid. Recommend advancing diet to dys 3 with chopped meats (pt right handed and underwent right wrist surgery today), continue thin liquids. ST to follow for assessment of diet tolerance. RN informed of recommendations.    HPI Other Pertinent Information: 74yo AA female with PMH of CML, ITP, anemia, HTN, HLD, HFpEF, taken to [facility] ED 8/14 for AMS by her son. Pt initially needing BiPAP. Swallow evaluation ordered as pt was exhibiting oral holding, particularly with meds.   Pertinent Vitals Pain Assessment: No/denies pain  SLP Plan  Continue with current plan of care    Recommendations Diet recommendations: Dysphagia 3 (mechanical soft);Thin liquid (chop meats) Liquids provided via: Straw;Cup Medication Administration: Whole meds with liquid (1 @ a time) Supervision: Patient able to self feed;Full supervision/cueing for compensatory strategies Compensations: Minimize environmental distractions;Slow rate;Small sips/bites;Follow solids with liquid Postural Changes and/or Swallow Maneuvers: Seated upright 90 degrees;Upright 30-60 min after meal              Oral Care Recommendations: Oral care BID Follow up Recommendations: 24 hour supervision/assistance Plan: Continue with current plan of care    [place] B. Quentin Ore [facility], CCC-SLP [phone]  Shonna Chock 06/05/2015, 5:00 PM

## 2015-06-05 NOTE — Transfer of Care (Signed)
Immediate Anesthesia Transfer of Care Note  Patient: [name]  Procedure(s) Performed: Procedure(s): INCISION AND DRAINAGE, RIGHT WRIST (Right)  Patient Location: PACU  Anesthesia Type:MAC  Level of Consciousness: awake, alert , oriented and patient cooperative  Airway & Oxygen Therapy: Patient Spontanous Breathing  Post-op Assessment: Report given to RN, Post -op Vital signs reviewed and stable, Patient moving all extremities and Patient moving all extremities X 4  Post vital signs: Reviewed and stable  Last Vitals:  Filed Vitals:   06/05/15 0541  BP: 147/74  Pulse:   Temp:   Resp:     Complications: No apparent anesthesia complications

## 2015-06-05 NOTE — Anesthesia Postprocedure Evaluation (Signed)
  Anesthesia Post-op Note  Patient: [name]  Procedure(s) Performed: Procedure(s): INCISION AND DRAINAGE, RIGHT WRIST (Right)  Patient Location: PACU  Anesthesia Type:General  Level of Consciousness: awake, alert  and oriented  Airway and Oxygen Therapy: Patient Spontanous Breathing and Patient connected to nasal cannula oxygen  Post-op Pain: mild  Post-op Assessment: Post-op Vital signs reviewed, Patient's Cardiovascular Status Stable, Respiratory Function Stable, Patent Airway and Pain level controlled LLE Motor Response: Purposeful movement, Responds to commands LLE Sensation: Full sensation RLE Motor Response: Purposeful movement, Responds to commands RLE Sensation: Full sensation      Post-op Vital Signs: stable  Last Vitals:  Filed Vitals:   06/05/15 1115  BP: 146/77  Pulse: 63  Temp: 36.5 C  Resp: 20    Complications: No apparent anesthesia complications

## 2015-06-05 NOTE — Clinical Social Work Placement (Signed)
   CLINICAL SOCIAL WORK PLACEMENT  NOTE  Date:  06/05/2015  Patient Details  Name: [name] MRN: [id number] Date of Birth: [birth year]  Clinical Social Work is seeking post-discharge placement for this patient at the [place] level of care (*CSW will initial, date and re-position this form in  chart as items are completed):  Yes   Patient/family provided with [facility] Work Department's list of facilities offering this level of care within the geographic area requested by the patient (or if unable, by the patient's family).  Yes   Patient/family informed of their freedom to choose among providers that offer the needed level of care, that participate in Medicare, Medicaid or managed care program needed by the patient, have an available bed and are willing to accept the patient.  Yes   Patient/family informed of [facility]'s ownership interest in [facility] and [facility], as well as of the fact that they are under no obligation to receive care at these facilities.  PASRR submitted to EDS on 06/01/15     PASRR number received on 06/01/15     Existing PASRR number confirmed on       FL2 transmitted to all facilities in geographic area requested by pt/family on 06/01/15     FL2 transmitted to all facilities within larger geographic area on       Patient informed that his/her managed care company has contracts with or will negotiate with certain facilities, including the following:         06/04/15 - Patient/family informed of bed offers received.  Patient chooses bed at   Physician recommends and patient chooses bed at      Patient to be transferred to   on  .  Patient to be transferred to facility by       Patient family notified on   of transfer.  Name of family member notified:        PHYSICIAN Please sign FL2     Additional Comment:  06/05/15 - CSW talked with patient daughter Davy Pique [phone]) by phone and facility  preferences are: (1) [facility] (2) [facility] (3) [facility].  _______________________________________________ Sable Feil, LCSW 06/05/2015, 12:56 PM

## 2015-06-05 NOTE — Progress Notes (Signed)
Debridement of right DRUJ and 4th compartment performed. Dressing applied. Will return to view wound on Thursday.  Micheline Rough, MD [facility] [phone]

## 2015-06-05 NOTE — Op Note (Signed)
05/27/2015 - 06/05/2015  8:37 AM  PATIENT:  [name]  74 y.o. female  PRE-OPERATIVE DIAGNOSIS:  Right wrist chronic abscess  POST-OPERATIVE DIAGNOSIS:  Same, with DRUJ septic arthritis and extensive 4th dorsal compartment infectious tenosynovitis and extensor tendon destruction  PROCEDURE:  DRUJ arthrotomy and excisional debridement; 4th compartment radical tenosynovectomy  SURGEON: Rayvon Char. Grandville Silos, MD  PHYSICIAN ASSISTANT: Morley Kos, OPA-C  ANESTHESIA:  local and MAC  SPECIMENS:  None  DRAINS:   None  EBL:  less than 50 mL  PREOPERATIVE INDICATIONS:  [name] is a  74 y.o. female with painful right dorsal wrist mass and aspiration having already grown salmonella.  The risks benefits and alternatives were discussed with the patient preoperatively including but not limited to the risks of infection, bleeding, nerve injury, cardiopulmonary complications, the need for revision surgery, among others, and the patient verbalized understanding and consented to proceed.  OPERATIVE IMPLANTS: None  OPERATIVE PROCEDURE:  After receiving prophylactic antibiotics, the patient was escorted to the operative theatre and placed in a supine position.A surgical "time-out" was performed during which the planned procedure, proposed operative site, and the correct patient identity were compared to the operative consent and agreement confirmed by the circulating nurse according to current facility policy.   I infiltrated the skin circumferentially around the planned field with lidocaine bearing epinephrine.  Following application of a tourniquet to the operative extremity, the exposed skin was prepped with Chloraprep and draped in the usual sterile fashion.  The limb was exsanguinated with gravity and the tourniquet inflated to approximately 143mmHg higher than systolic BP.  Longitudinal dorsal incision was made over the mass. Subcutaneous taste tissues were dissected with blunt spreading  and sharp dissection down to the extensor retinaculum. This was incised longitudinally over the fourth compartment. There was some fluid, but it was mostly organized snotty thickened tenosynovium. The bulk of the extensor tendons were found to be ruptured, the distal ends adherent to the surrounding compartment providing some tethering of them. The compartment was radically debrided of tenosynovium. The EDC to the index and the EIP appeared to be intact and well functioning. There was a hole in the floor the compartment. This was enlarged leading into the DRUJ with adjacent destruction of the radius and ulna. This was debrided with curettes and rongeurs. Satisfied with the degree of debridement, the wound is copiously irrigated with 3 L of irrigant. In the process a tourniquet was released and some additional hemostasis was obtained with Bovie electrocautery. The fourth compartment retinaculum was reapproximated with 3-0 PDS interrupted sutures 3. The skin was closed with 4-0 Vicryl Rapide in a running horizontal mattress configuration. A bulky dressing was applied. She was taken to recovery room in stable condition.  DISPOSITION: She will return to the floor for continued care.

## 2015-06-06 ENCOUNTER — Inpatient Hospital Stay (HOSPITAL_COMMUNITY): Payer: Medicare Other

## 2015-06-06 ENCOUNTER — Encounter (HOSPITAL_COMMUNITY): Payer: Self-pay | Admitting: Orthopedic Surgery

## 2015-06-06 DIAGNOSIS — M25511 Pain in right shoulder: Secondary | ICD-10-CM

## 2015-06-06 DIAGNOSIS — M25512 Pain in left shoulder: Secondary | ICD-10-CM

## 2015-06-06 LAB — CBC
HCT: 33 % — ABNORMAL LOW (ref 36.0–46.0)
Hemoglobin: 10.5 g/dL — ABNORMAL LOW (ref 12.0–15.0)
MCH: 26.6 pg (ref 26.0–34.0)
MCHC: 31.8 g/dL (ref 30.0–36.0)
MCV: 83.5 fL (ref 78.0–100.0)
PLATELETS: 71 10*3/uL — AB (ref 150–400)
RBC: 3.95 MIL/uL (ref 3.87–5.11)
RDW: 20.7 % — AB (ref 11.5–15.5)
WBC: 25.5 10*3/uL — AB (ref 4.0–10.5)

## 2015-06-06 LAB — PREPARE PLATELET PHERESIS
UNIT DIVISION: 0
Unit division: 0

## 2015-06-06 LAB — C-REACTIVE PROTEIN: CRP: 4.2 mg/dL — AB (ref <1.0)

## 2015-06-06 LAB — SEDIMENTATION RATE: SED RATE: 20 mm/h (ref 0–22)

## 2015-06-06 NOTE — Progress Notes (Signed)
Speech Language Pathology Treatment: Dysphagia  Patient Details Name: [name] MRN: [id number] DOB: 04/05/1941 Today's Date: 06/06/2015 Time: 9987-2158 SLP Time Calculation (min) (ACUTE ONLY): 9 min  Assessment / Plan / Recommendation Clinical Impression  Pt seen for f/u to assess diet tolerance with advanced diet. Pt consumed regular textures, thin liquids, and mixed consistencies as RN administered medications with sips of water. Mildly excessive mastication is noted with regular textures with Min cues for use of liquid wash to clear bolus from oral cavity. No overt signs of aspiration are noted, and there is no oral holding. Recommend to continue with Dys 3 diet and thin liquids. No further acute SLP needs identified, will sign off.   HPI Other Pertinent Information: 74yo AA female with PMH of CML, ITP, anemia, HTN, HLD, HFpEF, taken to [facility] ED 8/14 for AMS by her son. Pt initially needing BiPAP. Swallow evaluation ordered as pt was exhibiting oral holding, particularly with meds.   Pertinent Vitals Pain Assessment: 0-10 Pain Score: 10-Worst pain ever Pain Location: bil shoulders and neck Pain Descriptors / Indicators: Aching;Moaning;Grimacing (Yelling in pain) Pain Intervention(s): Repositioned;Patient requesting pain meds-RN notified;Heat applied  SLP Plan  All goals met    Recommendations Diet recommendations: Dysphagia 3 (mechanical soft);Thin liquid Liquids provided via: Straw;Cup Medication Administration: Whole meds with liquid Supervision: Patient able to self feed;Full supervision/cueing for compensatory strategies Compensations: Minimize environmental distractions;Slow rate;Small sips/bites;Follow solids with liquid Postural Changes and/or Swallow Maneuvers: Seated upright 90 degrees;Upright 30-60 min after meal       Oral Care Recommendations: Oral care BID Follow up Recommendations: 24 hour supervision/assistance Plan: All goals met    Germain Osgood, M.A.  CCC-SLP [phone]  Germain Osgood 06/06/2015, 10:57 AM

## 2015-06-06 NOTE — Progress Notes (Signed)
Patient ID: [name], female   DOB: September 14, 1941, 74 y.o.   MRN: [id number]         [facility] for Infectious Disease    Date of Admission:  05/27/2015   Total days of antibiotics 11        Day 6 trimethoprim-sulfamethoxazole  Principal Problem:   Salmonella bacteremia Active Problems:   Sepsis   Right wrist deformity   Acute respiratory failure with hypoxemia   Acute respiratory failure   Lethargic   CMML (chronic myelomonocytic leukemia)   Septic arthritis of hand, right   . fluticasone  1 spray Each Nare Daily  . folic acid  1 mg Oral Daily  . furosemide  40 mg Oral Daily  . lidocaine  1 patch Transdermal Q24H  . metoprolol tartrate  25 mg Oral BID  . nystatin  5 mL Oral QID  . oxybutynin  5 mg Oral Daily  . pantoprazole  40 mg Oral BID AC  . predniSONE  60 mg Oral Q breakfast  . sulfamethoxazole-trimethoprim  1 tablet Oral Q12H    SUBJECTIVE: She is feeling much better now but states that she was having severe left shoulder pain earlier today.   Review of Systems: Pertinent items are noted in HPI.  Past Medical History  Diagnosis Date  . Hypertension   . Hyperlipidemia     Social History  Substance Use Topics  . Smoking status: Never Smoker   . Smokeless tobacco: None  . Alcohol Use: No    History reviewed. No pertinent family history. Allergies  Allergen Reactions  . Aspirin Rash    OBJECTIVE: Filed Vitals:   06/05/15 2036 06/06/15 0545 06/06/15 0636 06/06/15 0935  BP: 141/65 167/81 144/68 140/72  Pulse: 80 72  78  Temp: 98.9 F (37.2 C) 98.7 F (37.1 C)  98.2 F (36.8 C)  TempSrc: Oral Oral  Oral  Resp: 16 16  18   Height:      Weight:      SpO2: 96% 99%  99%   Body mass index is 23.2 kg/(m^2).  General: she is sitting up in bed. Lungs: clear Cor: regular S1 and S2 with no murmur Right wrist in postoperative dressing. More swelling of her fingers distal to the dressing Left shoulder does not demonstrate any swelling, redness  or warmth  Lab Results Lab Results  Component Value Date   WBC 25.5* 06/06/2015   HGB 10.5* 06/06/2015   HCT 33.0* 06/06/2015   MCV 83.5 06/06/2015   PLT 71* 06/06/2015    Lab Results  Component Value Date   CREATININE 1.45* 06/05/2015   BUN 44* 06/05/2015   NA 136 06/05/2015   K 4.2 06/05/2015   CL 105 06/05/2015   CO2 22 06/05/2015    Lab Results  Component Value Date   ALT 21 05/29/2015   AST 27 05/29/2015   ALKPHOS 70 05/29/2015   BILITOT 1.2 05/29/2015     Microbiology: Recent Results (from the past 240 hour(s))  MRSA PCR Screening     Status: None   Collection Time: 05/27/15  7:36 PM  Result Value Ref Range Status   MRSA by PCR NEGATIVE NEGATIVE Final    Comment:        The GeneXpert MRSA Assay (FDA approved for NASAL specimens only), is one component of a comprehensive MRSA colonization surveillance program. It is not intended to diagnose MRSA infection nor to guide or monitor treatment for MRSA infections.   Body fluid culture  Status: None (Preliminary result)   Collection Time: 05/31/15  8:00 AM  Result Value Ref Range Status   Specimen Description FLUID WRIST RIGHT SYNOVIAL  Final   Special Requests Normal  Final   Gram Stain   Final    ABUNDANT WBC PRESENT,BOTH PMN AND MONONUCLEAR NO ORGANISMS SEEN    Culture   Final    MODERATE [place] SPECIES CRITICAL RESULT CALLED TO, READ BACK BY AND VERIFIED WITH: A AHATSAWO,RN AT 1121 05/29/15 BY L BENFIELD CRITICAL RESULT CALLED TO, READ BACK BY AND VERIFIED WITH: NOTIFIED DR. Megan Salon OF [place] ON [id number] AT [place] NOTIFIED FAXED COPY ON [id number] SENDING TO STATE FOR SEROTYPING    Report Status PENDING  Incomplete   Organism ID, Bacteria [place] SPECIES  Final      Susceptibility   Salmonella species - MIC*    AMPICILLIN <=2 SENSITIVE Sensitive     LEVOFLOXACIN <=0.12 SENSITIVE Sensitive     TRIMETH/SULFA <=20 SENSITIVE Sensitive     * MODERATE [place]  SPECIES  Surgical pcr screen     Status: None   Collection Time: 06/04/15 10:50 PM  Result Value Ref Range Status   MRSA, PCR NEGATIVE NEGATIVE Final   Staphylococcus aureus NEGATIVE NEGATIVE Final    Comment:        The Xpert SA Assay (FDA approved for NASAL specimens in patients over 89 years of age), is one component of a comprehensive surveillance program.  Test performance has been validated by [facility] for patients greater than or equal to 20 year old. It is not intended to diagnose infection nor to guide or monitor treatment.      ASSESSMENT: She has Salmonella bacteremia complicated by right wrist infection. She is now complaining of left shoulder infection but I do not see any evidence of septic arthritis there. I will continue trimethoprim-sulfamethoxazole.  PLAN: 1. Trimethoprim sulfamethoxazole one single strength tablet twice a day for a minimum of weeks total  Michel Bickers, MD [facility] for [place] [phone] pager   [phone] cell 06/06/2015, 4:54 PM

## 2015-06-06 NOTE — Progress Notes (Signed)
[facility] PROGRESS NOTE  [name] QMG:[id number] DOB: Jun 04, 1941 DOA: 05/27/2015 PCP: Leotis Shames, MD  Brief narrative 74 year old African-American female with history of CML, ITP, anemia of chronic disease, hypertension, hyperlipidemia, chronic diastolic CHF who was taken to [facility] ED by her son for altered mental status. (Patient was confused, inattentive and drowsy and unable to take her medications on the day of presentation). She was also found to be febrile and incontinent. Patient was found to be in acute hypoxic respiratory failure with pulmonary edema initially requiring BiPAP on admission and was found to be septic with Escherichia coli UTI, Salmonella bacteremia and septic right wrist joint. She was also found to have worsening thrombocytopenia and leukocytosis. Oncology, hand surgeon and ID consulted.  Assessment/Plan: Sepsis secondary to Escherichia coli UTI and Salmonella bacteremia Resolved.  empiric Bactrim (1 single strength tablet twice daily for 5 more weeks) as per ID recommendation.  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 Synovial fluid count of 22k with negative culture.   arthrotomy and excisional debridement  of the right distal radial ulnar joint with fourth compartment radial tenosynovectomy on 8/23 by Dr. Grandville Silos.   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 Hematology following. Recommends to continue prednisone 60 mg daily as inpatient. No role  for IVIG at this time.  ecoli UTI  on bactrim   Leukocytosis Secondary to myeloproliferative disorder and underlying sepsis. Continue to monitor.  Anemia Combination of iron deficiency and anemia of chronic disease. Hemoglobin stable after 2u prbc on 8/19.  Acute encephalopathy Likely secondary to sepsis and now resolved.  Bilateral shoulders and neck pain X-ray of the shoulders on admission negative for any injury. Possibly degenerative  symptoms.I will repeat x-ray of the shoulder to rule out any joint effusion and x-ray of cervical spine. Pain control.  CKD stage 3 Renal fn at baseline.  DVT prophylaxis:  SCD  Diet: Dysphagia level III   Code Status: full Code Family Communication:no family at bedside Disposition Plan: Skilled nursing facility tomorrow  after wound evaluated by surgery   Consultants:  Hand surgery   ID   hematology  Procedures:  2-D echo  MRI right wrist  Head CT  Antibiotics:  Bactrim  HPI/Subjective: Patient seen and examined . C/o pain in b/l shoulders and back of the neck  Objective: Filed Vitals:   06/06/15 0935  BP: 140/72  Pulse: 78  Temp: 98.2 F (36.8 C)  Resp: 18    Intake/Output Summary (Last 24 hours) at 06/06/15 1128 Last data filed at 06/06/15 1013  Gross per 24 hour  Intake    900 ml  Output    200 ml  Net    700 ml   Filed Weights   05/31/15 1900 06/01/15 0500 06/05/15 0439  Weight: 55.43 kg (122 lb 3.2 oz) 56 kg (123 lb 7.3 oz) 63.24 kg (139 lb 6.7 oz)    Exam:   General: Elderly female in some distress with pain  HEENT:moist oral mucosa  Chest: Clear to auscultation bilaterally  CVS: Normal S1 and S2, no gallop  GI: Soft, nondistended, nontender, bowel sounds present   musculoskeletal: Bilateral shoulder tenderness with limited ROM, tenderness in the back of the neck, dressing over rt wrist  CNS: Alert and oriented  Data Reviewed: Basic Metabolic Panel:  Recent Labs Lab 06/01/15 0227 06/02/15 1009 06/03/15 0318 06/04/15 0506 06/05/15 0619  NA 142 137 137 137 136  K 3.9 4.1 4.1  4.1 4.2  CL 107 103 103 104 105  CO2 25 21* 22 25 22   GLUCOSE 141* 103* 118* 88 92  BUN 52* 60* 56* 47* 44*  CREATININE 1.72* 1.65* 1.61* 1.45* 1.45*  CALCIUM 8.4* 8.5* 8.3* 8.4* 8.5*   Liver Function Tests: No results for input(s): AST, ALT, ALKPHOS, BILITOT, PROT, ALBUMIN in the last 168 hours. No results for input(s): LIPASE, AMYLASE in the  last 168 hours. No results for input(s): AMMONIA in the last 168 hours. CBC:  Recent Labs Lab 06/02/15 1720 06/03/15 0318 06/04/15 0506 06/05/15 0619 06/06/15 0458  WBC 25.0* 31.8* 34.1* 35.9* 25.5*  NEUTROABS 20.9*  --   --   --   --   HGB 11.1* 11.6* 11.8* 11.6* 10.5*  HCT 33.1* 34.0* 36.5 35.5* 33.0*  MCV 79.2 79.3 82.0 82.2 83.5  PLT 31* 37* 51* 64* 71*   Cardiac Enzymes: No results for input(s): CKTOTAL, CKMB, CKMBINDEX, TROPONINI in the last 168 hours. BNP (last 3 results)  Recent Labs  05/28/15 0226  BNP 515.4*    ProBNP (last 3 results) No results for input(s): PROBNP in the last 8760 hours.  CBG:  Recent Labs Lab 05/31/15 1157  GLUCAP 99    Recent Results (from the past 240 hour(s))  Blood Culture (routine x 2)     Status: None (Preliminary result)   Collection Time: 05/27/15  4:16 PM  Result Value Ref Range Status   Specimen Description BLOOD RIGHT FOREARM  Final   Special Requests   Final    BOTTLES DRAWN AEROBIC AND ANAEROBIC  AER 5CC ANA 3CC   Culture  Setup Time   Final    GRAM NEGATIVE RODS IN BOTH AEROBIC AND ANAEROBIC BOTTLES CRITICAL RESULT CALLED TO, READ BACK BY AND VERIFIED WITH: [place] VAN DYKE AT 6073 05/28/15.PMH CONFIRMED BY [facility]    Culture   Final    [place] SPECIES IN BOTH AEROBIC AND ANAEROBIC BOTTLES REFERRED TO [facility] IN [place], [place] IDENTIFICATION/CONFIRMATION Results Called to: [place] AT 7106 05/30/15 DV    Report Status PENDING  Incomplete   Organism ID, Bacteria [place] SPECIES  Final      Susceptibility   Salmonella species - MIC*    AMPICILLIN <=2 SENSITIVE Sensitive     LEVOFLOXACIN <=0.12 SENSITIVE Sensitive     TRIMETH/SULFA <=20 SENSITIVE Sensitive     * [place] SPECIES  Blood Culture (routine x 2)     Status: None   Collection Time: 05/27/15  4:16 PM  Result Value Ref Range Status   Specimen Description BLOOD RIGHT HAND  Final   Special Requests BOTTLES  DRAWN AEROBIC AND ANAEROBIC  1CC  Final   Culture  Setup Time   Final    GRAM NEGATIVE RODS IN BOTH AEROBIC AND ANAEROBIC BOTTLES CRITICAL RESULT CALLED TO, READ BACK BY AND VERIFIED WITH: [place] VAN DYKE AT 2694 05/28/15.PMH CONFIRMED BY [facility] CRITICAL VALUE NOTED.  VALUE IS CONSISTENT WITH PREVIOUSLY REPORTED AND CALLED VALUE.    Culture   Final    [place] SPECIES IN BOTH AEROBIC AND ANAEROBIC BOTTLES PREVIOUSLY CALLED REFERRED TO [facility] IN [place], [place] FOR IDENTIFICATION/CONFIRMATION    Report Status 05/30/2015 FINAL  Final   Organism ID, Bacteria [place] SPECIES  Final      Susceptibility   Salmonella species - MIC*    AMPICILLIN <=2 SENSITIVE Sensitive     LEVOFLOXACIN <=0.12 SENSITIVE Sensitive     TRIMETH/SULFA <=20 SENSITIVE  Sensitive     * [place] SPECIES  Urine culture     Status: None   Collection Time: 05/27/15  4:53 PM  Result Value Ref Range Status   Specimen Description URINE, RANDOM  Final   Special Requests NONE  Final   Culture >=100,000 COLONIES/mL ESCHERICHIA COLI  Final   Report Status 05/29/2015 FINAL  Final   Organism ID, Bacteria ESCHERICHIA COLI  Final      Susceptibility   Escherichia coli - MIC*    AMPICILLIN <=2 SENSITIVE Sensitive     CEFTAZIDIME <=1 SENSITIVE Sensitive     CEFAZOLIN <=4 SENSITIVE Sensitive     CEFTRIAXONE <=1 SENSITIVE Sensitive     CIPROFLOXACIN <=0.25 SENSITIVE Sensitive     GENTAMICIN <=1 SENSITIVE Sensitive     IMIPENEM <=0.25 SENSITIVE Sensitive     TRIMETH/SULFA <=20 SENSITIVE Sensitive     NITROFURANTOIN Value in next row Sensitive      SENSITIVE<=16    PIP/TAZO Value in next row Sensitive      SENSITIVE<=4    * >=100,000 COLONIES/mL ESCHERICHIA COLI  MRSA PCR Screening     Status: None   Collection Time: 05/27/15  7:36 PM  Result Value Ref Range Status   MRSA by PCR NEGATIVE NEGATIVE Final    Comment:        The GeneXpert MRSA Assay (FDA approved for NASAL  specimens only), is one component of a comprehensive MRSA colonization surveillance program. It is not intended to diagnose MRSA infection nor to guide or monitor treatment for MRSA infections.   Body fluid culture     Status: None (Preliminary result)   Collection Time: 05/31/15  8:00 AM  Result Value Ref Range Status   Specimen Description FLUID WRIST RIGHT SYNOVIAL  Final   Special Requests Normal  Final   Gram Stain   Final    ABUNDANT WBC PRESENT,BOTH PMN AND MONONUCLEAR NO ORGANISMS SEEN    Culture   Final    MODERATE [place] SPECIES CRITICAL RESULT CALLED TO, READ BACK BY AND VERIFIED WITH: A AHATSAWO,RN AT 1121 05/29/15 BY L BENFIELD CRITICAL RESULT CALLED TO, READ BACK BY AND VERIFIED WITH: NOTIFIED DR. Megan Salon OF [place] ON [id number] AT [place] NOTIFIED FAXED COPY ON [id number] SENDING TO STATE FOR SEROTYPING    Report Status PENDING  Incomplete   Organism ID, Bacteria [place] SPECIES  Final      Susceptibility   Salmonella species - MIC*    AMPICILLIN <=2 SENSITIVE Sensitive     LEVOFLOXACIN <=0.12 SENSITIVE Sensitive     TRIMETH/SULFA <=20 SENSITIVE Sensitive     * MODERATE [place] SPECIES  Surgical pcr screen     Status: None   Collection Time: 06/04/15 10:50 PM  Result Value Ref Range Status   MRSA, PCR NEGATIVE NEGATIVE Final   Staphylococcus aureus NEGATIVE NEGATIVE Final    Comment:        The Xpert SA Assay (FDA approved for NASAL specimens in patients over 88 years of age), is one component of a comprehensive surveillance program.  Test performance has been validated by [facility] for patients greater than or equal to 26 year old. It is not intended to diagnose infection nor to guide or monitor treatment.      Studies: No results found.  Scheduled Meds: . fluticasone  1 spray Each Nare Daily  . folic acid  1 mg Oral Daily  . furosemide  40 mg Oral Daily  . lidocaine  1 patch Transdermal Q24H  .  metoprolol tartrate  25 mg Oral BID  . nystatin  5 mL Oral QID  . oxybutynin  5 mg Oral Daily  . pantoprazole  40 mg Oral BID AC  . predniSONE  60 mg Oral Q breakfast  . sulfamethoxazole-trimethoprim  1 tablet Oral Q12H   Continuous Infusions:     Time spent: 25 minutes    Julea Hutto  [facility] Pager [phone] If 7PM-7AM, please contact night-coverage at [email or web address], password [facility] 06/06/2015, 11:28 AM  LOS: 10 days

## 2015-06-06 NOTE — Progress Notes (Signed)
Physical Therapy Treatment Patient Details Name: [name] MRN: [id number] DOB: Feb 15, 1941 Today's Date: 06/06/2015    History of Present Illness Patient is a very pleasant 74 year old African-American female with past medical history significant for CML, ITP, anemia of chronic disease, hypertension, hyperlipidemia, chronic diastolic heart failure on echocardiogram in 2014, who was taken to the emergency department by her son Dallas Breeding, for altered mental status.  Positive for E. coli UTI. underwent incision and drainage of her right wrist septic arthritis/tenosynovitis 8/23    PT Comments    Pain significantly limiting any mobility and activity tolerance today  Follow Up Recommendations  SNF;Supervision/Assistance - 24 hour     Equipment Recommendations  None recommended by PT    Recommendations for Other Services       Precautions / Restrictions Precautions Precautions: Fall    Mobility  Bed Mobility Overal bed mobility: Needs Assistance Bed Mobility: Sit to Supine       Sit to supine: Mod assist   General bed mobility comments: Mod assis tfor lifting LEs into bed  Transfers Overall transfer level: Needs assistance Equipment used: Rolling walker (2 wheeled) Transfers: Sit to/from Stand Sit to Stand: Mod assist         General transfer comment: Mod assist to pwer up to stand; Cues for safety and hand placement and to initiate with anterior weight shift  Ambulation/Gait Ambulation/Gait assistance: Min assist Ambulation Distance (Feet):  (pivotal steps from recliner back to bed) Assistive device: Rolling walker (2 wheeled) (With PT support at R elbow as well) Gait Pattern/deviations: Shuffle     General Gait Details: Extremely painful bil shoulders with shuffle steps to the chair; did not attempt further amb due to pain   Stairs            Wheelchair Mobility    Modified Rankin (Stroke Patients Only)       Balance Overall balance assessment:  Needs assistance           Standing balance-Leahy Scale: Poor                      Cognition Arousal/Alertness: Awake/alert Behavior During Therapy: WFL for tasks assessed/performed Overall Cognitive Status:  (extremely internally distracted by pain)                      Exercises      General Comments        Pertinent Vitals/Pain Pain Assessment: 0-10 Pain Score: 10-Worst pain ever Pain Location: bil shoulders and neck Pain Descriptors / Indicators: Aching;Moaning;Grimacing (Yelling in pain) Pain Intervention(s): Repositioned;Patient requesting pain meds-RN notified;Heat applied    Home Living                      Prior Function            PT Goals (current goals can now be found in the care plan section) Acute Rehab PT Goals Patient Stated Goal: for today, Pt wanting to get back into bed; needing better pain control PT Goal Formulation: With patient Time For Goal Achievement: 06/14/15 Potential to Achieve Goals: Good Progress towards PT goals: Not progressing toward goals - comment (significant pain limiting activity tolerance)    Frequency  Min 3X/week    PT Plan Current plan remains appropriate    Co-evaluation             End of Session   Activity Tolerance: Patient limited by pain Patient left:  in bed;with call bell/phone within reach;with nursing/sitter in room (Heat applied to bil shoulders and neck)     Time: 0379-4446 PT Time Calculation (min) (ACUTE ONLY): 20 min  Charges:  $Therapeutic Activity: 8-22 mins                    G Codes:      Quin Hoop 06/06/2015, 11:58 AM  Roney [place], [place] Pager [phone] Office [phone]

## 2015-06-06 NOTE — Clinical Social Work Placement (Signed)
   CLINICAL SOCIAL WORK PLACEMENT  NOTE  Date:  06/06/2015  Patient Details  Name: [name] MRN: [id number] Date of Birth: 07/30/1941  Clinical Social Work is seeking post-discharge placement for this patient at the [place] level of care (*CSW will initial, date and re-position this form in  chart as items are completed):  Yes   Patient/family provided with [facility] Work Department's list of facilities offering this level of care within the geographic area requested by the patient (or if unable, by the patient's family).  Yes   Patient/family informed of their freedom to choose among providers that offer the needed level of care, that participate in Medicare, Medicaid or managed care program needed by the patient, have an available bed and are willing to accept the patient.  Yes   Patient/family informed of [facility]'s ownership interest in [facility] and [facility], as well as of the fact that they are under no obligation to receive care at these facilities.  PASRR submitted to EDS on 06/01/15     PASRR number received on 06/01/15     Existing PASRR number confirmed on       FL2 transmitted to all facilities in geographic area requested by pt/family on 06/01/15     FL2 transmitted to all facilities within larger geographic area on       Patient informed that his/her managed care company has contracts with or will negotiate with certain facilities, including the following:         06/04/15 - Patient/family informed of bed offers received.  Patient chooses bed at  [facility].     Physician recommends and patient chooses bed at      Patient to be transferred to  [facility] on  .  Patient to be transferred to facility by   ambulance    Patient family notified on  06/06/15 of transfer.  Contacted daughter Davy Pique [phone]) to advised of 8/25 discharge and that [facility] can take patient on  Thursday and has a private room.  Name of family member notified:   Davy Pique, daughter.     PHYSICIAN Please sign FL2     Additional Comment:    _______________________________________________ Sable Feil, LCSW 06/06/2015, 11:52 AM

## 2015-06-07 DIAGNOSIS — N189 Chronic kidney disease, unspecified: Secondary | ICD-10-CM | POA: Diagnosis not present

## 2015-06-07 DIAGNOSIS — L309 Dermatitis, unspecified: Secondary | ICD-10-CM | POA: Diagnosis not present

## 2015-06-07 DIAGNOSIS — G92 Toxic encephalopathy: Secondary | ICD-10-CM | POA: Diagnosis not present

## 2015-06-07 DIAGNOSIS — D638 Anemia in other chronic diseases classified elsewhere: Secondary | ICD-10-CM | POA: Diagnosis present

## 2015-06-07 DIAGNOSIS — C9312 Chronic myelomonocytic leukemia, in relapse: Secondary | ICD-10-CM | POA: Diagnosis not present

## 2015-06-07 DIAGNOSIS — J9601 Acute respiratory failure with hypoxia: Secondary | ICD-10-CM | POA: Diagnosis not present

## 2015-06-07 DIAGNOSIS — R2231 Localized swelling, mass and lump, right upper limb: Secondary | ICD-10-CM | POA: Diagnosis not present

## 2015-06-07 DIAGNOSIS — R1311 Dysphagia, oral phase: Secondary | ICD-10-CM | POA: Diagnosis not present

## 2015-06-07 DIAGNOSIS — A021 Salmonella sepsis: Secondary | ICD-10-CM

## 2015-06-07 DIAGNOSIS — Z87440 Personal history of urinary (tract) infections: Secondary | ICD-10-CM | POA: Diagnosis not present

## 2015-06-07 DIAGNOSIS — N390 Urinary tract infection, site not specified: Secondary | ICD-10-CM | POA: Diagnosis not present

## 2015-06-07 DIAGNOSIS — Z8249 Family history of ischemic heart disease and other diseases of the circulatory system: Secondary | ICD-10-CM | POA: Diagnosis not present

## 2015-06-07 DIAGNOSIS — M47812 Spondylosis without myelopathy or radiculopathy, cervical region: Secondary | ICD-10-CM | POA: Diagnosis present

## 2015-06-07 DIAGNOSIS — R21 Rash and other nonspecific skin eruption: Secondary | ICD-10-CM | POA: Diagnosis not present

## 2015-06-07 DIAGNOSIS — G9340 Encephalopathy, unspecified: Secondary | ICD-10-CM | POA: Diagnosis not present

## 2015-06-07 DIAGNOSIS — I129 Hypertensive chronic kidney disease with stage 1 through stage 4 chronic kidney disease, or unspecified chronic kidney disease: Secondary | ICD-10-CM | POA: Diagnosis present

## 2015-06-07 DIAGNOSIS — I5030 Unspecified diastolic (congestive) heart failure: Secondary | ICD-10-CM | POA: Diagnosis not present

## 2015-06-07 DIAGNOSIS — N9489 Other specified conditions associated with female genital organs and menstrual cycle: Secondary | ICD-10-CM | POA: Diagnosis present

## 2015-06-07 DIAGNOSIS — R262 Difficulty in walking, not elsewhere classified: Secondary | ICD-10-CM | POA: Diagnosis not present

## 2015-06-07 DIAGNOSIS — A419 Sepsis, unspecified organism: Secondary | ICD-10-CM | POA: Diagnosis not present

## 2015-06-07 DIAGNOSIS — C9210 Chronic myeloid leukemia, BCR/ABL-positive, not having achieved remission: Secondary | ICD-10-CM | POA: Diagnosis present

## 2015-06-07 DIAGNOSIS — D693 Immune thrombocytopenic purpura: Secondary | ICD-10-CM | POA: Diagnosis present

## 2015-06-07 DIAGNOSIS — M009 Pyogenic arthritis, unspecified: Secondary | ICD-10-CM | POA: Diagnosis not present

## 2015-06-07 DIAGNOSIS — M109 Gout, unspecified: Secondary | ICD-10-CM | POA: Diagnosis present

## 2015-06-07 DIAGNOSIS — E43 Unspecified severe protein-calorie malnutrition: Secondary | ICD-10-CM | POA: Diagnosis present

## 2015-06-07 DIAGNOSIS — R278 Other lack of coordination: Secondary | ICD-10-CM | POA: Diagnosis not present

## 2015-06-07 DIAGNOSIS — L519 Erythema multiforme, unspecified: Secondary | ICD-10-CM | POA: Diagnosis present

## 2015-06-07 DIAGNOSIS — R32 Unspecified urinary incontinence: Secondary | ICD-10-CM | POA: Diagnosis present

## 2015-06-07 DIAGNOSIS — J9691 Respiratory failure, unspecified with hypoxia: Secondary | ICD-10-CM | POA: Diagnosis not present

## 2015-06-07 DIAGNOSIS — D72829 Elevated white blood cell count, unspecified: Secondary | ICD-10-CM | POA: Diagnosis not present

## 2015-06-07 DIAGNOSIS — Z79899 Other long term (current) drug therapy: Secondary | ICD-10-CM | POA: Diagnosis not present

## 2015-06-07 DIAGNOSIS — M19011 Primary osteoarthritis, right shoulder: Secondary | ICD-10-CM | POA: Diagnosis present

## 2015-06-07 DIAGNOSIS — R238 Other skin changes: Secondary | ICD-10-CM | POA: Diagnosis not present

## 2015-06-07 DIAGNOSIS — K219 Gastro-esophageal reflux disease without esophagitis: Secondary | ICD-10-CM | POA: Diagnosis not present

## 2015-06-07 DIAGNOSIS — R64 Cachexia: Secondary | ICD-10-CM | POA: Diagnosis not present

## 2015-06-07 DIAGNOSIS — D649 Anemia, unspecified: Secondary | ICD-10-CM | POA: Diagnosis not present

## 2015-06-07 DIAGNOSIS — E039 Hypothyroidism, unspecified: Secondary | ICD-10-CM | POA: Diagnosis present

## 2015-06-07 DIAGNOSIS — N183 Chronic kidney disease, stage 3 (moderate): Secondary | ICD-10-CM | POA: Diagnosis not present

## 2015-06-07 DIAGNOSIS — T7840XA Allergy, unspecified, initial encounter: Secondary | ICD-10-CM | POA: Diagnosis not present

## 2015-06-07 DIAGNOSIS — R2681 Unsteadiness on feet: Secondary | ICD-10-CM | POA: Diagnosis not present

## 2015-06-07 DIAGNOSIS — A029 Salmonella infection, unspecified: Secondary | ICD-10-CM | POA: Diagnosis not present

## 2015-06-07 DIAGNOSIS — M6281 Muscle weakness (generalized): Secondary | ICD-10-CM | POA: Diagnosis not present

## 2015-06-07 DIAGNOSIS — M19019 Primary osteoarthritis, unspecified shoulder: Secondary | ICD-10-CM | POA: Diagnosis present

## 2015-06-07 DIAGNOSIS — E785 Hyperlipidemia, unspecified: Secondary | ICD-10-CM | POA: Diagnosis not present

## 2015-06-07 DIAGNOSIS — D696 Thrombocytopenia, unspecified: Secondary | ICD-10-CM | POA: Diagnosis not present

## 2015-06-07 LAB — CBC
HEMATOCRIT: 31.7 % — AB (ref 36.0–46.0)
Hemoglobin: 10.2 g/dL — ABNORMAL LOW (ref 12.0–15.0)
MCH: 26.4 pg (ref 26.0–34.0)
MCHC: 32.2 g/dL (ref 30.0–36.0)
MCV: 81.9 fL (ref 78.0–100.0)
Platelets: 100 10*3/uL — ABNORMAL LOW (ref 150–400)
RBC: 3.87 MIL/uL (ref 3.87–5.11)
RDW: 21 % — AB (ref 11.5–15.5)
WBC: 29.1 10*3/uL — ABNORMAL HIGH (ref 4.0–10.5)

## 2015-06-07 MED ORDER — FUROSEMIDE 20 MG PO TABS: 40.0000 mg | ORAL_TABLET | Freq: Every day | ORAL | Status: DC

## 2015-06-07 MED ORDER — FOLIC ACID 1 MG PO TABS: 1.0000 mg | ORAL_TABLET | Freq: Every day | ORAL | Status: AC

## 2015-06-07 MED ORDER — ACETAMINOPHEN ER 650 MG PO TBCR: 650.0000 mg | EXTENDED_RELEASE_TABLET | Freq: Four times a day (QID) | ORAL | Status: AC

## 2015-06-07 MED ORDER — LIDOCAINE 5 % EX PTCH: 1.0000 | MEDICATED_PATCH | CUTANEOUS | Status: DC

## 2015-06-07 MED ORDER — SULFAMETHOXAZOLE-TRIMETHOPRIM 800-160 MG PO TABS: 1.0000 | ORAL_TABLET | Freq: Two times a day (BID) | ORAL | Status: AC

## 2015-06-07 NOTE — Clinical Social Work Placement (Signed)
   CLINICAL SOCIAL WORK PLACEMENT  NOTE  Date:  06/07/2015  Patient Details  Name: [name] MRN: [id number] Date of Birth: Jun 09, 1941  Clinical Social Work is seeking post-discharge placement for this patient at the [place] level of care (*CSW will initial, date and re-position this form in  chart as items are completed):  Yes   Patient/family provided with [facility] Work Department's list of facilities offering this level of care within the geographic area requested by the patient (or if unable, by the patient's family).  Yes   Patient/family informed of their freedom to choose among providers that offer the needed level of care, that participate in Medicare, Medicaid or managed care program needed by the patient, have an available bed and are willing to accept the patient.  Yes   Patient/family informed of [facility] ownership interest in [facility] and [facility], as well as of the fact that they are under no obligation to receive care at these facilities.  PASRR submitted to EDS on 06/01/15     PASRR number received on 06/01/15     Existing PASRR number confirmed on       FL2 transmitted to all facilities in geographic area requested by pt/family on 06/01/15     FL2 transmitted to all facilities within larger geographic area on       Patient informed that his/her managed care company has contracts with or will negotiate with certain facilities, including the following:         06/06/15   Patient/family informed of bed offers received.  Patient chooses bed at  [facility]     Physician recommends and patient chooses bed at      Patient to be transferred to  [facility] on   06/07/15.  Patient to be transferred to facility by  family (son)     Patient family notified on  06/07/15 of transfer.  Name of family member notified:   Daughter Davy Pique and Son Dallas Breeding.     PHYSICIAN Please sign FL2      Additional Comment:    _______________________________________________ Sable Feil, LCSW 06/07/2015, 3:18 PM

## 2015-06-07 NOTE — Care Management Important Message (Signed)
Important Message  Patient Details  Name: [name] MRN: [id number] Date of Birth: Aug 04, 1941   Medicare Important Message Given:  Yes-fourth notification given    Delorse Lek 06/07/2015, 12:01 PM

## 2015-06-07 NOTE — Progress Notes (Signed)
Called to give report to staff at [facility]; gave report to nurse Anderson Malta. IV removed without issue. Tele box removed and CCMD notified of pt's discharge. Gave discharge packet to pt's son and he understands he is to give that to the pt's nurse at the facility. Pt will leave the unit via wheelchair to private car.

## 2015-06-07 NOTE — Progress Notes (Signed)
POD 2, right wrist I&D Pt c/o pain in multiple places, sitting up, about to eat breakfast R UE dressing removed, wound benign  Sutures are absorbable and will fall away.  Not required to be removed. May engage in activity as tolerated with her R UE No hand surgery f/u required.  Would be happy to see her back in the office if concerns arise.  Micheline Rough, MD [facility] [phone]

## 2015-06-07 NOTE — Discharge Summary (Signed)
Physician Discharge Summary  [name] MRN:[id number] DOB: 04/27/1941 DOA: 05/27/2015  PCP: DROSTIN, CHRISTINA M, MD  Admit date: 05/27/2015 Discharge date: 06/07/2015  Time spent: 35 minutes  Recommendations for Outpatient Follow-up:  1. Charge a skilled nursing facility. 2. Patient will be discharged on 5 more weeks of oral Bactrim which she will complete on 07/12/2015. 3. Please check weekly CBC and be met while on antibiotics. 4. Please have speech and swallow evaluate her at the facility in one week to evaluate if diet can be advanced further. 5. Please have pateint evaluated by nutritionist at the facility.  Discharge Diagnoses:  Principal Problem:   Acute respiratory failure with hypoxemia   Active Problems:   Sepsis   Protein-calorie malnutrition, severe   Chronic kidney disease (CKD), stage III (moderate)   Idiopathic thrombocytopenia purpura   Chronic monocytic leukemia   Right wrist deformity   Salmonella bacteremia   CMML (chronic myelomonocytic leukemia)   Septic arthritis of hand, right   Acute encephalopathy   Osteoarthritis of right shoulder    ecoli UTI   Discharge Condition: Fair  Diet recommendation: Dysphagia level III with thin liquid  Filed Weights   06/01/15 0500 06/05/15 0439 06/06/15 2010  Weight: 56 kg (123 lb 7.3 oz) 63.24 kg (139 lb 6.7 oz) 60.328 kg (133 lb)    History of present illness:  Please refer to admission H&P for details, in brief,74-year-old African-American female with history of CML, ITP, anemia of chronic disease, hypertension, hyperlipidemia, chronic diastolic CHF who was taken to [facility] ED by her son for altered mental status. (Patient was confused, inattentive and drowsy and unable to take her medications on the day of presentation). She was also found to be febrile and incontinent. Patient was found to be in acute hypoxic respiratory failure with pulmonary edema initially requiring BiPAP on admission and was found to be  septic with Escherichia coli UTI, Salmonella bacteremia and septic right wrist joint. She was also found to have worsening thrombocytopenia and leukocytosis. Oncology, hand surgeon and ID consulted.  Hospital Course:  Acute hypoxic respiratory failure Secondary to sepsis. Patient was admitted to the ICU on BiPAP and placed on empiric antibiotics. Sepsis resolved and patient transferred to medical floor. Antibiotic narrowed to oral Bactrim.\  Sepsis secondary to Escherichia coli UTI and Salmonella bacteremia Resolved. empiric Bactrim (1 single strength tablet twice daily for 5 more weeks) as per ID recommendation. Added daily folic acid while on Bactrim.  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 Synovial fluid count of 22k with negative culture.  Patient underwentarthrotomy and excisional debridement of the right distal radial ulnar joint with fourth compartment radial tenosynovectomy on 8/23 by Dr. Thompson. Wound evaluated by Dr. Thompson today and appears clean. Sutures are absorbable. She is able to mobilize her right wrist without any difficulty. She does not need further outpatient follow-up unless necessary.   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 Continued on oral prednisone 60 mg daily as inpatient. No role for IVIG. Platelets have improved and his 100 today. Will discontinue prednisone. She should follow-up with her hematologist.  ecoli UTI on bactrim   Leukocytosis Secondary to myeloproliferative disorder and underlying sepsis. Continue to monitor. Follow-up with her hematologist as outpatient.  Anemia Combination of iron deficiency and anemia of chronic disease. Hemoglobin stable after 2u prbc on 8/19. Continue iron supplement.  Acute encephalopathy Likely secondary to sepsis.  now resolved.  Bilateral shoulders and neck pain   X-ray of the shoulders on admission negative for any injury. X-ray of the cervical  spine done on 8/24 concerning for C2 fracture. CT scan of the cervical spine was done which was negative for any fracture or injury showed diffuse degenerative changes and demineralization of the cervical spine. X-ray of bilateral shoulders repeated given plane of severe pain on 8/24 and showed osteoarthritis of right shoulder. Continue Lidoderm patch daily over right shoulder, when necessary Tylenol and baclofen.  . CKD stage 3 Renal fn at baseline. Please monitor as outpatient.  Protein calorie malnutrition Please have nutritionist evaluate patient facility..  Generalized weakness Patient seen at physical therapy and recommended skilled nursing facility. Family agreeable and is stable to be discharged today.  Diet: Dysphagia level III   Code Status: full Code Family Communication:no family at bedside Disposition Plan: Skilled nursing facility   Consultants:  Hand surgery  ID  hematology  Procedures:  2-D echo  MRI right wrist  Head CT  Antibiotics:  Bactrim  Discharge Exam: Filed Vitals:   06/07/15 0819  BP: 158/71  Pulse: 67  Temp: 97.8 F (36.6 C)  Resp: 18     General: Elderly female in no acute distress  HEENT: Pallor present, moist oral mucosa, supple neck, normal range of motion of the neck without any tenderness  Chest: Clear to auscultation bilaterally  CVS: Normal S1 and S2, no gallop  GI: Soft, nondistended, nontender, bowel sounds present   musculoskeletal: Clean sutures over right wrist, nontender and normal range of motion, normal range of motion of bilateral shoulder, no tenderness on exam.  CNS: Alert and oriented   Discharge Instructions    Current Discharge Medication List    START taking these medications   Details  folic acid (FOLVITE) 1 MG tablet Take 1 tablet (1 mg total) by mouth daily. Qty: 30 tablet, Refills: 0    lidocaine (LIDODERM) 5 % Place 1 patch onto the skin daily. Remove & Discard patch within 12 hours  or as directed by MD. Apply over right shoulder Qty: 15 patch, Refills: 0      CONTINUE these medications which have CHANGED   Details  acetaminophen (TYLENOL) 650 MG CR tablet Take 1 tablet (650 mg total) by mouth every 6 (six) hours. Qty: 30 tablet, Refills: 0    furosemide (LASIX) 20 MG tablet Take 2 tablets (40 mg total) by mouth daily. Qty: 30 tablet, Refills: 11    sulfamethoxazole-trimethoprim (BACTRIM DS,SEPTRA DS) 800-160 MG per tablet Take 1 tablet by mouth 2 (two) times daily. Qty: 70 tablet, Refills: 0      CONTINUE these medications which have NOT CHANGED   Details  baclofen (LIORESAL) 10 MG tablet Take 5 mg by mouth at bedtime.    febuxostat (ULORIC) 40 MG tablet Take 40 mg by mouth daily.     ferrous gluconate (FERGON) 324 MG tablet Take 324 mg by mouth 2 (two) times daily with a meal.    metoprolol tartrate (LOPRESSOR) 25 MG tablet Take 25 mg by mouth 2 (two) times daily.    oxybutynin (DITROPAN) 5 MG tablet Take 5 mg by mouth daily. Refills: 5    pantoprazole (PROTONIX) 40 MG tablet Take 40 mg by mouth daily before breakfast.    polyethylene glycol (MIRALAX / GLYCOLAX) packet Take 17 g by mouth daily as needed for moderate constipation.      STOP taking these medications     cephALEXin (KEFLEX) 500 MG capsule      fluticasone (FLONASE) 50   MCG/ACT nasal spray      potassium chloride SA (K-DUR,KLOR-CON) 20 MEQ tablet      predniSONE (DELTASONE) 20 MG tablet        Allergies  Allergen Reactions  . Aspirin Rash   Follow-up Information    Please follow up.   Why:  MD at SNF       The results of significant diagnostics from this hospitalization (including imaging, microbiology, ancillary and laboratory) are listed below for reference.    Significant Diagnostic Studies: Dg Cervical Spine Complete  06/06/2015   CLINICAL DATA:  Upper neck pain  EXAM: CERVICAL SPINE  4+ VIEWS  COMPARISON:  No similar prior exam is available at this institution for  comparison or on [facility] P[facility].  FINDINGS: There is a possible fracture of C2. Alignment is normal. No other significant bone abnormalities are identified. Bones are subjectively osteopenic. Mild disc degenerative change at C3-C4. Left neural foramina are suboptimally imaged due to patient rotation and inability to be appropriately positioned for optimal visualization.  IMPRESSION: Possible C2 fracture at the base of the dens, suboptimally evaluated with the current projections. CT scan of the cervical spine is recommended for further evaluation. These results were called by telephone at the time of interpretation on 06/06/2015 at 4:58 pm to Kameka, RN for Dr. NISHANT DHUNGEL , who verbally acknowledged these results.   Electronically Signed   By: Gretchen  Green M.D.   On: 06/06/2015 16:58   Dg Shoulder Right  06/06/2015   CLINICAL DATA:  Sharp upper neck pain, no injury.  EXAM: RIGHT SHOULDER - 2+ VIEW  COMPARISON:  None.  FINDINGS: No fracture or dislocation. Degenerative changes are seen in the right acromioclavicular joint. Visualized portion of the right chest is unremarkable.  IMPRESSION: 1. No acute findings. 2. Right acromioclavicular joint osteoarthritis.   Electronically Signed   By: Melinda  Blietz M.D.   On: 06/06/2015 16:55   Dg Wrist Complete Right  05/28/2015   CLINICAL DATA:  Posterior wrist swelling. CML  EXAM: RIGHT WRIST - COMPLETE 3+ VIEW  COMPARISON:  None.  FINDINGS: There is a large erosion with overhanging edges involving the lateral distal radius with irregularity of the medial ulnar head. An ill-defined lucency in the distal ulnar metadiaphysis is likely related to the same process. There is soft tissue fullness in this region, but no calcified soft tissue mass.  Advanced first CMC joint narrowing with subchondral cystic change and spurring.  Osteopenia.  No acute fracture dislocation.  Limited study due to patient condition, with no true lateral imaging.  IMPRESSION: 1. Erosions  centered at the distal radial ulnar joint, likely related to patient's history of gout. 2. Limited portable study.   Electronically Signed   By: Jonathon  Watts M.D.   On: 05/28/2015 01:33   Ct Head Wo Contrast  05/31/2015   CLINICAL DATA:  Lethargy.  Occipital headache today.  EXAM: CT HEAD WITHOUT CONTRAST  TECHNIQUE: Contiguous axial images were obtained from the base of the skull through the vertex without intravenous contrast.  COMPARISON:  None.  FINDINGS: The brainstem, cerebellum, cerebral peduncles, thalamus, basal ganglia, basilar cisterns, and ventricular system appear within normal limits. Periventricular white matter and corona radiata hypodensities favor chronic ischemic microvascular white matter disease. No intracranial hemorrhage, mass lesion, or acute CVA.  There is opacification of a single right-sided ethmoid air cell.  IMPRESSION: 1. No acute intracranial findings. 2. Periventricular white matter and corona radiata hypodensities favor chronic ischemic microvascular white matter disease. 3.   Minimal chronic ethmoid sinusitis.   Electronically Signed   By: Van Clines M.D.   On: 05/31/2015 12:43   Ct Cervical Spine Wo Contrast  06/06/2015   CLINICAL DATA:  Neck pain. C2 cervical fracture suggested on plain radiographs.  EXAM: CT CERVICAL SPINE WITHOUT CONTRAST  TECHNIQUE: Multidetector CT imaging of the cervical spine was performed without intravenous contrast. Multiplanar CT image reconstructions were also generated.  COMPARISON:  Cervical spine radiographs 06/06/2015  FINDINGS: Diffuse bone demineralization. Normal alignment of the cervical spine. Degenerative changes throughout the cervical spine with narrowed cervical interspaces and associated endplate hypertrophic changes. Degenerative changes are most prominent at C3-4 and C5-6 levels. No vertebral compression deformities. No prevertebral soft tissue swelling. The C1-C2 articulation appears normal. Degenerative changes at C1 to  with degenerative cysts at the base of the odontoid process. No acute fracture or displacement of C2 is identified. Plain radiographic changes may have reflected artifact or degenerative change. Degenerative changes throughout the cervical facet joints.  IMPRESSION: Diffuse degenerative change and demineralization of the cervical spine. Normal alignment. No acute displaced fractures identified. C2 appears intact. No odontoid fracture identified.   Electronically Signed   By: Lucienne Capers M.D.   On: 06/06/2015 19:21   Mr Wrist Right Wo Contrast  05/30/2015   CLINICAL DATA:  Right wrist deformity and pain.  EXAM: MR OF THE RIGHT WRIST WITHOUT CONTRAST  TECHNIQUE: Multiplanar, multisequence MR imaging of the right wrist was performed. No intravenous contrast was administered.  COMPARISON:  05/27/2015  FINDINGS: Despite efforts by the technologist and patient, motion artifact is present on today's exam and could not be eliminated. This reduces exam sensitivity and specificity. The patient had difficulty tolerating imaging.  Ligaments: Greater than expected fluid signal along the proximal scapholunate interval, but quite likely due to erosion rather than definite scapholunate ligament tear. No scapholunate separation is identified.  Triangular fibrocartilage: TFCC disc not well seen, suspected chronic tear.  Tendons: Prominent fluid distention of the tendon sheaths of extensor digitorum compartment (extensor compartment 4) at the level of the wrist, in continuity with the eroded effusion of the distal radioulnar joint, with associated filling defects compatible with synovitis. Possibly torn or deficient extensor digitorum tendons of the third and fourth fingers, which seem to reconstitute along the distal margin of the cystic collection.  Carpal tunnel/median nerve: Grossly unremarkable.  Guyon's canal: Unremarkable  Joint/cartilage: Prominent erosions along the distal radioulnar joint with contiguity of the dry  to with the fourth extensor compartment in the radiocarpal joint.  Bones/carpal alignment: In addition to the large erosions of the distal radial ulnar joint, there are degenerative appearing findings with spurring and loss of articular space at the first and second carpometacarpal articulations.  Other: No supplemental non-categorized findings.  IMPRESSION: 1. 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 Portions of the extensor digitorum tendons, including those extending to the third and fourth fingers, may be torn or partially torn. Differential diagnostic considerations favor rheumatoid arthritis, gout, or similar erosive arthropathy. Infection unlikely but not entirely excluded. It should be straightforward to sample the dorsal fluid collection with a needle, the large fluid collection is only 4 mm below the skin surface. 2. Degenerative findings at the first and second carpometacarpal articulations.   Electronically Signed   By: Van Clines M.D.   On: 05/30/2015  14:29   Dg Chest Port 1 View  05/28/2015   CLINICAL DATA:  Acute respiratory failure  EXAM: PORTABLE CHEST - 1 VIEW  COMPARISON:  05/27/2015  FINDINGS: Chronic cardiomegaly.  Hypoventilation which limits evaluation. Lower lung opacities again seen, somewhat confluent in the retrocardiac lung. No edema, effusion, or pneumothorax.  IMPRESSION: Stable hypoventilation and basilar atelectasis. Cannot exclude retrocardiac pneumonia.   Electronically Signed   By: Jonathon  Watts M.D.   On: 05/28/2015 01:35   Dg Chest Port 1 View  05/27/2015   CLINICAL DATA:  74-year-old female with fever, hypoxia, tachycardia. Initial encounter.  EXAM: PORTABLE CHEST - 1 VIEW  COMPARISON:  06/28/2013.  FINDINGS: Portable AP upright view at 1621 hours.  Lower lung volumes. Crowding of lung markings. Stable cardiomegaly and mediastinal contours. No pneumothorax, pulmonary edema, pleural effusion or consolidation.  IMPRESSION: Low lung volumes with atelectasis.  Stable cardiomegaly.   Electronically Signed   By: H  Hall M.D.   On: 05/27/2015 16:44   Dg Shoulder Left  06/06/2015   CLINICAL DATA:  History of pain.  EXAM: LEFT SHOULDER - 2+ VIEW  COMPARISON:  05/28/2015.  FINDINGS: Acromioclavicular and glenohumeral degenerative change. No evidence of fracture or dislocation. No focal bony abnormality.  IMPRESSION: Acromioclavicular and glenohumeral degenerative change. No acute abnormality.   Electronically Signed   By: Thomas  Register   On: 06/06/2015 16:55   Dg Shoulder Left Port  05/28/2015   CLINICAL DATA:  Bilateral shoulder pain  EXAM: LEFT SHOULDER - 1 VIEW  COMPARISON:  None.  FINDINGS: No fracture or dislocation is seen.  Mild degenerative changes of the acromioclavicular joint.  The visualized soft tissues are unremarkable.  Visualized left lung is essentially clear.  IMPRESSION: No fracture or dislocation is seen.   Electronically Signed   By: Sriyesh  Krishnan M.D.   On: 05/28/2015 21:29   Dg Shoulder Right Port  05/28/2015   CLINICAL DATA:  Bilateral shoulder pain  EXAM: PORTABLE RIGHT SHOULDER - 2+ VIEW  COMPARISON:  None.  FINDINGS: No fracture or dislocation is seen.  Degenerative changes of the acromioclavicular joint.  Visualized soft tissues are within normal limits.  Visualized right lung is clear.  IMPRESSION: No fracture or dislocation is seen.   Electronically Signed   By: Sriyesh  Krishnan M.D.   On: 05/28/2015 21:29    Microbiology: Recent Results (from the past 240 hour(s))  Body fluid culture     Status: None (Preliminary result)   Collection Time: 05/31/15  8:00 AM  Result Value Ref Range Status   Specimen Description FLUID WRIST RIGHT SYNOVIAL  Final   Special Requests Normal  Final   Gram Stain   Final    ABUNDANT WBC  PRESENT,BOTH PMN AND MONONUCLEAR NO ORGANISMS SEEN    Culture   Final    MODERATE SALMONELLA SPECIES CRITICAL RESULT CALLED TO, READ BACK BY AND VERIFIED WITH: A AHATSAWO,RN AT 1121 05/29/15 BY L BENFIELD CRITICAL RESULT CALLED TO, READ BACK BY AND VERIFIED WITH: NOTIFIED DR. [place] ON [id number] AT 1125 BY [facility] NOTIFIED FAXED COPY ON 082116 SENDING TO [facility] FOR SEROTYPING    Report Status PENDING  Incomplete   Organism ID, Bacteria SALMONELLA SPECIES  Final      Susceptibility   Salmonella species - MIC*    AMPICILLIN <=2 SENSITIVE Sensitive     LEVOFLOXACIN <=0.12 SENSITIVE Sensitive     TRIMETH/SULFA <=20 SENSITIVE Sensitive     * MODERATE SALMONELLA SPECIES    Surgical pcr screen     Status: None   Collection Time: 06/04/15 10:50 PM  Result Value Ref Range Status   MRSA, PCR NEGATIVE NEGATIVE Final   Staphylococcus aureus NEGATIVE NEGATIVE Final    Comment:        The Xpert SA Assay (FDA approved for NASAL specimens in patients over 65 years of age), is one component of a comprehensive surveillance program.  Test performance has been validated by [facility] for patients greater than or equal to 43 year old. It is not intended to diagnose infection nor to guide or monitor treatment.      Labs: Basic Metabolic Panel:  Recent Labs Lab 06/01/15 0227 06/02/15 1009 06/03/15 0318 06/04/15 0506 06/05/15 0619  NA 142 137 137 137 136  K 3.9 4.1 4.1 4.1 4.2  CL 107 103 103 104 105  CO2 25 21* _0 GLUCOSE 141* 103* 118* 88 92  BUN 52* 60* 56* 47* 44*  CREATININE 1.72* 1.65* 1.61* 1.45* 1.45*  CALCIUM 8.4* 8.5* 8.3* 8.4* 8.5*   Liver Function Tests: No results for input(s): AST, ALT, ALKPHOS, BILITOT, PROT, ALBUMIN in the last 168 hours. No results for input(s): LIPASE, AMYLASE in the last 168 hours. No results for input(s): AMMONIA in the last 168 hours. CBC:  Recent Labs Lab 06/02/15 1720 06/03/15 0318 06/04/15 0506  06/05/15 0619 06/06/15 0458 06/07/15 0654  WBC 25.0* 31.8* 34.1* 35.9* 25.5* 29.1*  NEUTROABS 20.9*  --   --   --   --   --   HGB 11.1* 11.6* 11.8* 11.6* 10.5* 10.2*  HCT 33.1* 34.0* 36.5 35.5* 33.0* 31.7*  MCV 79.2 79.3 82.0 82.2 83.5 81.9  PLT 31* 37* 51* 64* 71* 100*   Cardiac Enzymes: No results for input(s): CKTOTAL, CKMB, CKMBINDEX, TROPONINI in the last 168 hours. BNP: BNP (last 3 results)  Recent Labs  05/28/15 0226  BNP 515.4*    ProBNP (last 3 results) No results for input(s): PROBNP in the last 8760 hours.  CBG:  Recent Labs Lab 05/31/15 1157  GLUCAP 99       Signed:  Kemuel Buchmann  [facility] 06/07/2015, 10:26 AM

## 2015-06-07 NOTE — Discharge Instructions (Signed)
Right wrist: Activity as tolerated, may splint for comfort/support as needed Sutures are absorbable and will eventually fall away.  They do not require removal.

## 2015-06-12 DIAGNOSIS — A029 Salmonella infection, unspecified: Secondary | ICD-10-CM | POA: Diagnosis not present

## 2015-06-12 DIAGNOSIS — E785 Hyperlipidemia, unspecified: Secondary | ICD-10-CM | POA: Diagnosis not present

## 2015-06-12 DIAGNOSIS — A419 Sepsis, unspecified organism: Secondary | ICD-10-CM | POA: Diagnosis not present

## 2015-06-12 DIAGNOSIS — I5030 Unspecified diastolic (congestive) heart failure: Secondary | ICD-10-CM | POA: Diagnosis not present

## 2015-06-12 DIAGNOSIS — N183 Chronic kidney disease, stage 3 (moderate): Secondary | ICD-10-CM | POA: Diagnosis not present

## 2015-06-12 DIAGNOSIS — G92 Toxic encephalopathy: Secondary | ICD-10-CM | POA: Diagnosis not present

## 2015-06-12 DIAGNOSIS — I129 Hypertensive chronic kidney disease with stage 1 through stage 4 chronic kidney disease, or unspecified chronic kidney disease: Secondary | ICD-10-CM | POA: Diagnosis not present

## 2015-06-12 DIAGNOSIS — C9312 Chronic myelomonocytic leukemia, in relapse: Secondary | ICD-10-CM | POA: Diagnosis not present

## 2015-06-26 ENCOUNTER — Ambulatory Visit: Payer: Medicare Other | Admitting: Family Medicine

## 2015-07-02 ENCOUNTER — Other Ambulatory Visit: Payer: Self-pay | Admitting: Internal Medicine

## 2015-07-02 ENCOUNTER — Encounter: Payer: Self-pay | Admitting: *Deleted

## 2015-07-02 DIAGNOSIS — C9310 Chronic myelomonocytic leukemia not having achieved remission: Secondary | ICD-10-CM

## 2015-07-02 LAB — BODY FLUID CULTURE

## 2015-07-03 ENCOUNTER — Inpatient Hospital Stay: Payer: No Typology Code available for payment source

## 2015-07-03 ENCOUNTER — Other Ambulatory Visit: Payer: Self-pay | Admitting: Family Medicine

## 2015-07-03 ENCOUNTER — Inpatient Hospital Stay: Payer: No Typology Code available for payment source | Attending: Family Medicine | Admitting: Internal Medicine

## 2015-07-03 VITALS — BP 140/72 | HR 62 | Temp 97.8°F | Resp 21 | Wt 117.5 lb

## 2015-07-03 DIAGNOSIS — D649 Anemia, unspecified: Secondary | ICD-10-CM | POA: Insufficient documentation

## 2015-07-03 DIAGNOSIS — I129 Hypertensive chronic kidney disease with stage 1 through stage 4 chronic kidney disease, or unspecified chronic kidney disease: Secondary | ICD-10-CM | POA: Insufficient documentation

## 2015-07-03 DIAGNOSIS — D72829 Elevated white blood cell count, unspecified: Secondary | ICD-10-CM | POA: Insufficient documentation

## 2015-07-03 DIAGNOSIS — N189 Chronic kidney disease, unspecified: Secondary | ICD-10-CM | POA: Diagnosis not present

## 2015-07-03 DIAGNOSIS — D693 Immune thrombocytopenic purpura: Secondary | ICD-10-CM

## 2015-07-03 DIAGNOSIS — R64 Cachexia: Secondary | ICD-10-CM | POA: Insufficient documentation

## 2015-07-03 DIAGNOSIS — C9250 Acute myelomonocytic leukemia, not having achieved remission: Secondary | ICD-10-CM

## 2015-07-03 DIAGNOSIS — Z87440 Personal history of urinary (tract) infections: Secondary | ICD-10-CM | POA: Insufficient documentation

## 2015-07-03 DIAGNOSIS — D696 Thrombocytopenia, unspecified: Secondary | ICD-10-CM | POA: Diagnosis not present

## 2015-07-03 DIAGNOSIS — Z79899 Other long term (current) drug therapy: Secondary | ICD-10-CM | POA: Insufficient documentation

## 2015-07-03 DIAGNOSIS — C9310 Chronic myelomonocytic leukemia not having achieved remission: Secondary | ICD-10-CM

## 2015-07-03 LAB — CBC WITH DIFFERENTIAL/PLATELET
Band Neutrophils: 0 %
Basophils Absolute: 0 10*3/uL (ref 0–0.1)
Basophils Relative: 0 %
Blasts: 0 %
EOS PCT: 2 %
Eosinophils Absolute: 0.2 10*3/uL (ref 0–0.7)
HCT: 29.4 % — ABNORMAL LOW (ref 35.0–47.0)
HEMOGLOBIN: 9.6 g/dL — AB (ref 12.0–16.0)
LYMPHS ABS: 1.2 10*3/uL (ref 1.0–3.6)
Lymphocytes Relative: 13 %
MCH: 28.2 pg (ref 26.0–34.0)
MCHC: 32.7 g/dL (ref 32.0–36.0)
MCV: 86.4 fL (ref 80.0–100.0)
MONOS PCT: 32 %
Metamyelocytes Relative: 0 %
Monocytes Absolute: 2.9 10*3/uL — ABNORMAL HIGH (ref 0.2–0.9)
Myelocytes: 0 %
NEUTROS ABS: 4.8 10*3/uL (ref 1.4–6.5)
NEUTROS PCT: 53 %
NRBC: 0 /100{WBCs}
Other: 0 %
PLATELETS: 63 10*3/uL — AB (ref 150–440)
Promyelocytes Absolute: 0 %
RBC: 3.4 MIL/uL — AB (ref 3.80–5.20)
RDW: 23.9 % — ABNORMAL HIGH (ref 11.5–14.5)
WBC: 9.1 10*3/uL (ref 3.6–11.0)

## 2015-07-03 LAB — IRON AND TIBC
Iron: 71 ug/dL (ref 28–170)
Saturation Ratios: 34 % — ABNORMAL HIGH (ref 10.4–31.8)
TIBC: 207 ug/dL — ABNORMAL LOW (ref 250–450)
UIBC: 136 ug/dL

## 2015-07-03 LAB — FERRITIN: FERRITIN: 659 ng/mL — AB (ref 11–307)

## 2015-07-03 NOTE — Assessment & Plan Note (Signed)
Chronic anemia normocytic-unclear etiology possibly from chronic kidney disease. Today hemoglobin is around 9.2. Patient is fairly asymptomatic. Would not recommend any blood transfusions.

## 2015-07-03 NOTE — Progress Notes (Signed)
[facility] OFFICE PROGRESS NOTE  Patient Care Team: [name], MD as PCP - General (Family Medicine)  HPI  SUMMARY of HEMATOLOGIC HISTORY:  # ? CMML/leukocytosis with monocytosis chronic; no bone marrow biopsy done  # Thrombocytopenia ? ITP versus others  # Anemia- Question CKD  Chronic kidney disease; debility/walks with a cane   INTERVAL HISTORY:  This is my first interaction with the patient since I joined the practice September 2016. I reviewed the patient's prior chart/pertinent labs/imaging in detail; findings are summarized.  A very pleasant 74 year old African-American female patient with possible CMML/leukocytosis  and history of anemia with thrombocytopenia is here for follow-up.   Most recently patient was admitted to the hospital for UTI; and recently discharged to a rehabilitation. The eventual plan is to get her home when she is physically stronger.  Patient stated that she is recuperating fairly well at the rehabilitation. She denies any bleeding gums or easy bruising.  REVIEW OF SYSTEMS:   All other systems were reviewed with the patient and are negative.She denies any unusual shortness of breath or cough. She has chronic bilateral shoulder pain/neck pain which is slightly worse.  I have reviewed the past medical history, past surgical history, social history and family history with the patient and they are unchanged from previous note unless stated above.  ALLERGIES:  is allergic to aspirin.  MEDICATIONS:  Current Outpatient Prescriptions  Medication Sig Dispense Refill  . acetaminophen (TYLENOL) 650 MG CR tablet Take 1 tablet (650 mg total) by mouth every 6 (six) hours. 30 tablet 0  . febuxostat (ULORIC) 40 MG tablet Take 40 mg by mouth daily.     . ferrous gluconate (FERGON) 324 MG tablet Take 324 mg by mouth 2 (two) times daily with a meal.    . fluticasone (FLONASE) 50 MCG/ACT nasal spray Place 1 spray into both nostrils daily.  11   . folic acid (FOLVITE) 1 MG tablet Take 1 tablet (1 mg total) by mouth daily. 30 tablet 0  . furosemide (LASIX) 20 MG tablet Take 2 tablets (40 mg total) by mouth daily. 30 tablet 11  . lidocaine (LIDODERM) 5 % Place 1 patch onto the skin daily. Remove & Discard patch within 12 hours or as directed by MD. Apply over right shoulder 15 patch 0  . metoprolol tartrate (LOPRESSOR) 25 MG tablet Take 25 mg by mouth 2 (two) times daily.    Marland Kitchen oxybutynin (DITROPAN) 5 MG tablet Take 5 mg by mouth daily.  5  . pantoprazole (PROTONIX) 40 MG tablet Take 40 mg by mouth daily before breakfast.    . potassium chloride SA (K-DUR,KLOR-CON) 20 MEQ tablet Take 20 mEq by mouth daily.  0  . sulfamethoxazole-trimethoprim (BACTRIM DS,SEPTRA DS) 800-160 MG per tablet Take 1 tablet by mouth 2 (two) times daily. 70 tablet 0  . baclofen (LIORESAL) 10 MG tablet Take 5 mg by mouth at bedtime.     No current facility-administered medications for this visit.    PHYSICAL EXAMINATION:   BP 140/72 mmHg  Pulse 62  Temp(Src) 97.8 F (36.6 C) (Tympanic)  Resp 21  Wt 117 lb 8.1 oz (53.3 kg)  SpO2 96%  Filed Weights   07/03/15 1523  Weight: 117 lb 8.1 oz (53.3 kg)    GENERAL:alert, no distress and comfortable. frail-appearing; she walks with a walker. She is cachectic. She is alone. She has difficulty getting up on the exam table. EYES: Positive for pallor. OROPHARYNX: Positive for dentures/ulceration. NECK:  No thyromegaly LYMPH:  no palpable lymphadenopathy in the cervical, axillary or inguinal LUNGS: clear to auscultation with normal breathing effort; No Wheeze or crackles  Cardio-vascular- Regular Rate & Rythm and no murmurs; one to 2+ bilateral symmetric lower extremity edema ABDOMEN:abdomen soft, non-tender and normal bowel sounds; No hepato-splenomegaly.  Musculoskeletal:no cyanosis of digits and no clubbing  NEURO: alert & oriented x 3 with fluent speech, no focal motor/sensory deficits. SKIN: no skin rash    LABORATORY DATA:  I have reviewed the data as listed    Component Value Date/Time   NA 136 06/05/2015 0619   NA 140 03/14/2014 1150   K 4.2 06/05/2015 0619   K 4.1 04/11/2014 1037   CL 105 06/05/2015 0619   CL 104 03/14/2014 1150   CO2 22 06/05/2015 0619   CO2 28 03/14/2014 1150   GLUCOSE 92 06/05/2015 0619   GLUCOSE 86 03/14/2014 1150   BUN 44* 06/05/2015 0619   BUN 35* 03/14/2014 1150   CREATININE 1.45* 06/05/2015 0619   CREATININE 1.63* 10/17/2014 1056   CALCIUM 8.5* 06/05/2015 0619   CALCIUM 8.8 03/14/2014 1150   PROT 7.5 05/29/2015 0536   PROT 9.1* 06/28/2013 1628   ALBUMIN 2.5* 05/29/2015 0536   ALBUMIN 2.6* 06/28/2013 1628   AST 27 05/29/2015 0536   AST 16 06/28/2013 1628   ALT 21 05/29/2015 0536   ALT 12 06/28/2013 1628   ALKPHOS 70 05/29/2015 0536   ALKPHOS 46 09/27/2013 0929   BILITOT 1.2 05/29/2015 0536   BILITOT 0.3 06/16/2014 1220   GFRNONAA 35* 06/05/2015 0619   GFRNONAA 33* 10/17/2014 1056   GFRNONAA 31* 06/20/2014 1356   GFRAA 41* 06/05/2015 0619   GFRAA 40* 10/17/2014 1056   GFRAA 36* 06/20/2014 1356    No results found for: SPEP, UPEP  Lab Results  Component Value Date   WBC 9.1 07/03/2015   NEUTROABS 4.8 07/03/2015   HGB 9.6* 07/03/2015   HCT 29.4* 07/03/2015   MCV 86.4 07/03/2015   PLT 63* 07/03/2015      Chemistry      Component Value Date/Time   NA 136 06/05/2015 0619   NA 140 03/14/2014 1150   K 4.2 06/05/2015 0619   K 4.1 04/11/2014 1037   CL 105 06/05/2015 0619   CL 104 03/14/2014 1150   CO2 22 06/05/2015 0619   CO2 28 03/14/2014 1150   BUN 44* 06/05/2015 0619   BUN 35* 03/14/2014 1150   CREATININE 1.45* 06/05/2015 0619   CREATININE 1.63* 10/17/2014 1056      Component Value Date/Time   CALCIUM 8.5* 06/05/2015 0619   CALCIUM 8.8 03/14/2014 1150   ALKPHOS 70 05/29/2015 0536   ALKPHOS 46 09/27/2013 0929   AST 27 05/29/2015 0536   AST 16 06/28/2013 1628   ALT 21 05/29/2015 0536   ALT 12 06/28/2013 1628   BILITOT  1.2 05/29/2015 0536   BILITOT 0.3 06/16/2014 1220       RADIOGRAPHIC STUDIES: I have personally reviewed the radiological images as listed and agreed with the findings in the report. No results found.   ASSESSMENT & PLAN:  Absolute anemia Chronic anemia normocytic-unclear etiology possibly from chronic kidney disease. Today hemoglobin is around 9.2. Patient is fairly asymptomatic. Would not recommend any blood transfusions.  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 Patient appears never to have bone marrow biopsy. I reviewed the ultrasound 2014 -no evidence of any hepatosplenomegaly.  Today interestingly her white count  is 9 ; differential is pending.    Idiopathic thrombocytopenia purpura Chronic low platelets anywhere between 50-90,000. Today platelets around 64; question ITP versus primary bone marrow process. Patient is asymptomatic she is not bleeding. Given multiple hematologic abnormalities I would recommend a bone marrow biopsy for further evaluation. I discussed the procedure in detail along with potential complications.   Patient after lengthy discussion decided to hold off any invasive procedures at this time as she currently just discharged from a lengthy hospital stay. She wants to think about the procedure and will let us know at the next visit in approximately 5-6 weeks.    Orders Placed This Encounter  Procedures  . CBC with Differential/Platelet    Standing Status: Future     Number of Occurrences:      Standing Expiration Date: 08/08/2015  . Comprehensive metabolic panel    Standing Status: Future     Number of Occurrences:      Standing Expiration Date: 08/08/2015  . Lactate dehydrogenase    Standing Status: Future     Number of Occurrences:      Standing Expiration Date: 08/08/2015   All questions were answered. The patient knows to call the clinic with any problems, questions or  concerns. No barriers to learning was detected. & I spent 25 minutes counseling the patient face to face. The total time spent in the appointment was 40 minutes and more than 50% was on counseling and review of test results     Cammie Sickle, MD 07/03/2015 4:30 PM

## 2015-07-03 NOTE — Assessment & Plan Note (Signed)
Patient had chronic leukocytosis up to 25-30,000 with predominant monocytosis. Patient appears never to have bone marrow biopsy. I reviewed the ultrasound 2014 -no evidence of any hepatosplenomegaly.  Today interestingly her white count is 9 ; differential is pending.

## 2015-07-03 NOTE — Assessment & Plan Note (Signed)
Chronic low platelets anywhere between 50-90,000. Today platelets around 64; question ITP versus primary bone marrow process. Patient is asymptomatic she is not bleeding. Given multiple hematologic abnormalities I would recommend a bone marrow biopsy for further evaluation. I discussed the procedure in detail along with potential complications.   Patient after lengthy discussion decided to hold off any invasive procedures at this time as she currently just discharged from a lengthy hospital stay. She wants to think about the procedure and will let us know at the next visit in approximately 5-6 weeks.

## 2015-07-03 NOTE — Patient Instructions (Signed)
Bone Marrow Aspiration and Bone Biopsy Examination of the bone marrow is a valuable test to diagnose blood disorders. A bone marrow biopsy takes a sample of bone and a small amount of fluid and cells from inside the bone. A bone marrow aspiration removes only the marrow. Bone marrow aspiration and bone biopsies are used to stage different disorders of the blood, such as leukemia. Staging will help your caregiver understand how far the disease has progressed.  The tests are also useful in diagnosing:  Fever of unknown origin (FUO).  Bacterial infections and other widespread fungal infections.  Cancers that have spread (metastasized) to the bone marrow.  Diseases that are characterized by a deficiency of an enzyme (storage diseases). This includes:  Niemann-Pick disease.  Gaucher disease. PROCEDURE  Sites used to get samples include:   Back of your hip bone (posterior iliac crest).  Both aspiration and biopsy.  Front of your hip bone (anterior iliac crest).  Both aspiration and biopsy.  Breastbone (sternum).  Aspiration from your breastbone (done only in adults). This method is rarely used. When you get a hip bone aspiration:  You are placed lying on your side with the upper knee brought up and flexed with the lower leg straight.  The site is prepared, cleaned with an antiseptic scrub, and draped. This keeps the biopsy area clean.  The skin and the area down to the lining of the bone (periosteum) are made numb with a local anesthetic.  The bone marrow aspiration needle is inserted. You will feel pressure on your bone.  Once inside the marrow cavity, a sample of bone marrow is sucked out (aspirated) for pathology slides.  The material collected for bone marrow slides is processed immediately by a technologist.  The technician selects the marrow particles to make the slides for pathology.  The marrow aspiration needle is removed. Then pressure is applied to the site with  gauze until bleeding has stopped. Following an aspiration, a bone marrow biopsy may be performed as well. The technique for this is very similar. A dressing is then applied.  RISKS AND COMPLICATIONS  The main complications of a bone marrow aspiration and biopsy include infection and bleeding.  Complications are uncommon. The procedure may not be performed in patients with bleeding tendencies.  A very rare complication from the procedure is injury to the heart during a breastbone (sternal) marrow aspiration. Only bone marrow aspirations are performed in this area.  Long-lasting pain at the site of the bone marrow aspiration and biopsy is uncommon. Your caregiver will let you know when you are to get your results and will discuss them with you. You may make an appointment with your caregiver to find out the results. Do not assume everything is normal if you have not heard from your caregiver or the medical facility. It is important for you to follow up on all of your test results. Document Released: 10/02/2004 Document Revised: 12/22/2011 Document Reviewed: 09/26/2008 [facility] Patient Information 2015 [place], [place]. This information is not intended to replace advice given to you by your health care provider. Make sure you discuss any questions you have with your health care provider.

## 2015-07-10 DIAGNOSIS — M6250 Muscle wasting and atrophy, not elsewhere classified, unspecified site: Secondary | ICD-10-CM | POA: Diagnosis not present

## 2015-07-10 DIAGNOSIS — R238 Other skin changes: Secondary | ICD-10-CM | POA: Diagnosis not present

## 2015-07-10 DIAGNOSIS — M19019 Primary osteoarthritis, unspecified shoulder: Secondary | ICD-10-CM | POA: Diagnosis present

## 2015-07-10 DIAGNOSIS — D509 Iron deficiency anemia, unspecified: Secondary | ICD-10-CM | POA: Diagnosis not present

## 2015-07-10 DIAGNOSIS — E039 Hypothyroidism, unspecified: Secondary | ICD-10-CM | POA: Diagnosis present

## 2015-07-10 DIAGNOSIS — Z5189 Encounter for other specified aftercare: Secondary | ICD-10-CM | POA: Diagnosis not present

## 2015-07-10 DIAGNOSIS — M6281 Muscle weakness (generalized): Secondary | ICD-10-CM | POA: Diagnosis not present

## 2015-07-10 DIAGNOSIS — C9312 Chronic myelomonocytic leukemia, in relapse: Secondary | ICD-10-CM | POA: Diagnosis not present

## 2015-07-10 DIAGNOSIS — E43 Unspecified severe protein-calorie malnutrition: Secondary | ICD-10-CM | POA: Diagnosis not present

## 2015-07-10 DIAGNOSIS — R21 Rash and other nonspecific skin eruption: Secondary | ICD-10-CM | POA: Diagnosis not present

## 2015-07-10 DIAGNOSIS — T7840XA Allergy, unspecified, initial encounter: Secondary | ICD-10-CM | POA: Diagnosis not present

## 2015-07-10 DIAGNOSIS — D638 Anemia in other chronic diseases classified elsewhere: Secondary | ICD-10-CM | POA: Diagnosis present

## 2015-07-10 DIAGNOSIS — R5381 Other malaise: Secondary | ICD-10-CM | POA: Diagnosis not present

## 2015-07-10 DIAGNOSIS — M47812 Spondylosis without myelopathy or radiculopathy, cervical region: Secondary | ICD-10-CM | POA: Diagnosis present

## 2015-07-10 DIAGNOSIS — N9489 Other specified conditions associated with female genital organs and menstrual cycle: Secondary | ICD-10-CM | POA: Diagnosis present

## 2015-07-10 DIAGNOSIS — M109 Gout, unspecified: Secondary | ICD-10-CM | POA: Diagnosis present

## 2015-07-10 DIAGNOSIS — R768 Other specified abnormal immunological findings in serum: Secondary | ICD-10-CM | POA: Diagnosis not present

## 2015-07-10 DIAGNOSIS — Z8249 Family history of ischemic heart disease and other diseases of the circulatory system: Secondary | ICD-10-CM | POA: Diagnosis not present

## 2015-07-10 DIAGNOSIS — N319 Neuromuscular dysfunction of bladder, unspecified: Secondary | ICD-10-CM | POA: Diagnosis not present

## 2015-07-10 DIAGNOSIS — L511 Stevens-Johnson syndrome: Secondary | ICD-10-CM | POA: Diagnosis not present

## 2015-07-10 DIAGNOSIS — R32 Unspecified urinary incontinence: Secondary | ICD-10-CM | POA: Diagnosis present

## 2015-07-10 DIAGNOSIS — R52 Pain, unspecified: Secondary | ICD-10-CM | POA: Diagnosis not present

## 2015-07-10 DIAGNOSIS — I10 Essential (primary) hypertension: Secondary | ICD-10-CM | POA: Diagnosis not present

## 2015-07-10 DIAGNOSIS — M009 Pyogenic arthritis, unspecified: Secondary | ICD-10-CM | POA: Diagnosis not present

## 2015-07-10 DIAGNOSIS — R2231 Localized swelling, mass and lump, right upper limb: Secondary | ICD-10-CM | POA: Diagnosis not present

## 2015-07-10 DIAGNOSIS — I129 Hypertensive chronic kidney disease with stage 1 through stage 4 chronic kidney disease, or unspecified chronic kidney disease: Secondary | ICD-10-CM | POA: Diagnosis not present

## 2015-07-10 DIAGNOSIS — E785 Hyperlipidemia, unspecified: Secondary | ICD-10-CM | POA: Diagnosis not present

## 2015-07-10 DIAGNOSIS — L309 Dermatitis, unspecified: Secondary | ICD-10-CM | POA: Diagnosis not present

## 2015-07-10 DIAGNOSIS — R262 Difficulty in walking, not elsewhere classified: Secondary | ICD-10-CM | POA: Diagnosis not present

## 2015-07-10 DIAGNOSIS — D649 Anemia, unspecified: Secondary | ICD-10-CM | POA: Diagnosis not present

## 2015-07-10 DIAGNOSIS — D693 Immune thrombocytopenic purpura: Secondary | ICD-10-CM | POA: Diagnosis present

## 2015-07-10 DIAGNOSIS — N183 Chronic kidney disease, stage 3 (moderate): Secondary | ICD-10-CM | POA: Diagnosis not present

## 2015-07-10 DIAGNOSIS — N766 Ulceration of vulva: Secondary | ICD-10-CM | POA: Diagnosis not present

## 2015-07-10 DIAGNOSIS — L519 Erythema multiforme, unspecified: Secondary | ICD-10-CM | POA: Diagnosis not present

## 2015-07-10 DIAGNOSIS — E440 Moderate protein-calorie malnutrition: Secondary | ICD-10-CM | POA: Diagnosis not present

## 2015-07-10 DIAGNOSIS — E876 Hypokalemia: Secondary | ICD-10-CM | POA: Diagnosis not present

## 2015-07-10 DIAGNOSIS — M329 Systemic lupus erythematosus, unspecified: Secondary | ICD-10-CM | POA: Diagnosis not present

## 2015-07-10 DIAGNOSIS — C9210 Chronic myeloid leukemia, BCR/ABL-positive, not having achieved remission: Secondary | ICD-10-CM | POA: Diagnosis present

## 2015-07-10 DIAGNOSIS — K219 Gastro-esophageal reflux disease without esophagitis: Secondary | ICD-10-CM | POA: Diagnosis not present

## 2015-07-10 DIAGNOSIS — I5030 Unspecified diastolic (congestive) heart failure: Secondary | ICD-10-CM | POA: Diagnosis not present

## 2015-07-12 ENCOUNTER — Ambulatory Visit: Payer: Medicare Other | Admitting: Internal Medicine

## 2015-07-13 ENCOUNTER — Telehealth: Payer: Self-pay | Admitting: *Deleted

## 2015-07-13 ENCOUNTER — Telehealth: Payer: Self-pay | Admitting: Internal Medicine

## 2015-07-13 NOTE — Telephone Encounter (Signed)
Patient currently in the [facility] at [facility]. MD Fellow calling. Needs to speak to Dr. Rogue Bussing for continuity of care. Needs to know when CML was dx; how patient was dx and any past tx or medical problems with cml.  Requesting call back after 130pm at [phone]

## 2015-07-13 NOTE — Telephone Encounter (Signed)
I spoke to hematology oncology fellow at [facility] regarding patient's blood work. I too recommend bone marrow biopsy for further evaluation.

## 2015-07-17 DIAGNOSIS — D649 Anemia, unspecified: Secondary | ICD-10-CM | POA: Diagnosis not present

## 2015-07-17 DIAGNOSIS — I429 Cardiomyopathy, unspecified: Secondary | ICD-10-CM | POA: Diagnosis not present

## 2015-07-17 DIAGNOSIS — M109 Gout, unspecified: Secondary | ICD-10-CM | POA: Diagnosis not present

## 2015-07-17 DIAGNOSIS — M2578 Osteophyte, vertebrae: Secondary | ICD-10-CM | POA: Diagnosis not present

## 2015-07-17 DIAGNOSIS — Z5189 Encounter for other specified aftercare: Secondary | ICD-10-CM | POA: Diagnosis not present

## 2015-07-17 DIAGNOSIS — K219 Gastro-esophageal reflux disease without esophagitis: Secondary | ICD-10-CM | POA: Diagnosis not present

## 2015-07-17 DIAGNOSIS — R231 Pallor: Secondary | ICD-10-CM | POA: Diagnosis not present

## 2015-07-17 DIAGNOSIS — R0602 Shortness of breath: Secondary | ICD-10-CM | POA: Diagnosis not present

## 2015-07-17 DIAGNOSIS — M25512 Pain in left shoulder: Secondary | ICD-10-CM | POA: Diagnosis not present

## 2015-07-17 DIAGNOSIS — D509 Iron deficiency anemia, unspecified: Secondary | ICD-10-CM | POA: Diagnosis not present

## 2015-07-17 DIAGNOSIS — I10 Essential (primary) hypertension: Secondary | ICD-10-CM | POA: Diagnosis not present

## 2015-07-17 DIAGNOSIS — N390 Urinary tract infection, site not specified: Secondary | ICD-10-CM | POA: Diagnosis not present

## 2015-07-17 DIAGNOSIS — D72829 Elevated white blood cell count, unspecified: Secondary | ICD-10-CM | POA: Diagnosis not present

## 2015-07-17 DIAGNOSIS — R29898 Other symptoms and signs involving the musculoskeletal system: Secondary | ICD-10-CM | POA: Diagnosis not present

## 2015-07-17 DIAGNOSIS — M4802 Spinal stenosis, cervical region: Secondary | ICD-10-CM | POA: Diagnosis not present

## 2015-07-17 DIAGNOSIS — M4046 Postural lordosis, lumbar region: Secondary | ICD-10-CM | POA: Diagnosis not present

## 2015-07-17 DIAGNOSIS — M8580 Other specified disorders of bone density and structure, unspecified site: Secondary | ICD-10-CM | POA: Diagnosis not present

## 2015-07-17 DIAGNOSIS — I129 Hypertensive chronic kidney disease with stage 1 through stage 4 chronic kidney disease, or unspecified chronic kidney disease: Secondary | ICD-10-CM | POA: Diagnosis not present

## 2015-07-17 DIAGNOSIS — N189 Chronic kidney disease, unspecified: Secondary | ICD-10-CM | POA: Diagnosis not present

## 2015-07-17 DIAGNOSIS — R262 Difficulty in walking, not elsewhere classified: Secondary | ICD-10-CM | POA: Diagnosis not present

## 2015-07-17 DIAGNOSIS — D696 Thrombocytopenia, unspecified: Secondary | ICD-10-CM | POA: Diagnosis not present

## 2015-07-17 DIAGNOSIS — M50321 Other cervical disc degeneration at C4-C5 level: Secondary | ICD-10-CM | POA: Diagnosis not present

## 2015-07-17 DIAGNOSIS — K121 Other forms of stomatitis: Secondary | ICD-10-CM | POA: Diagnosis not present

## 2015-07-17 DIAGNOSIS — G8929 Other chronic pain: Secondary | ICD-10-CM | POA: Diagnosis not present

## 2015-07-17 DIAGNOSIS — D72821 Monocytosis (symptomatic): Secondary | ICD-10-CM | POA: Diagnosis not present

## 2015-07-17 DIAGNOSIS — R5383 Other fatigue: Secondary | ICD-10-CM | POA: Diagnosis not present

## 2015-07-17 DIAGNOSIS — R3981 Functional urinary incontinence: Secondary | ICD-10-CM | POA: Diagnosis not present

## 2015-07-17 DIAGNOSIS — M6281 Muscle weakness (generalized): Secondary | ICD-10-CM | POA: Diagnosis not present

## 2015-07-17 DIAGNOSIS — R8290 Unspecified abnormal findings in urine: Secondary | ICD-10-CM | POA: Diagnosis not present

## 2015-07-17 DIAGNOSIS — M47812 Spondylosis without myelopathy or radiculopathy, cervical region: Secondary | ICD-10-CM | POA: Diagnosis not present

## 2015-07-17 DIAGNOSIS — M7550 Bursitis of unspecified shoulder: Secondary | ICD-10-CM | POA: Diagnosis not present

## 2015-07-17 DIAGNOSIS — M1A00X0 Idiopathic chronic gout, unspecified site, without tophus (tophi): Secondary | ICD-10-CM | POA: Diagnosis not present

## 2015-07-17 DIAGNOSIS — M329 Systemic lupus erythematosus, unspecified: Secondary | ICD-10-CM | POA: Diagnosis not present

## 2015-07-17 DIAGNOSIS — E440 Moderate protein-calorie malnutrition: Secondary | ICD-10-CM | POA: Diagnosis not present

## 2015-07-17 DIAGNOSIS — M25511 Pain in right shoulder: Secondary | ICD-10-CM | POA: Diagnosis not present

## 2015-07-17 DIAGNOSIS — C9210 Chronic myeloid leukemia, BCR/ABL-positive, not having achieved remission: Secondary | ICD-10-CM | POA: Diagnosis not present

## 2015-07-17 DIAGNOSIS — L511 Stevens-Johnson syndrome: Secondary | ICD-10-CM | POA: Diagnosis not present

## 2015-07-17 DIAGNOSIS — C9110 Chronic lymphocytic leukemia of B-cell type not having achieved remission: Secondary | ICD-10-CM | POA: Diagnosis not present

## 2015-07-17 DIAGNOSIS — M6250 Muscle wasting and atrophy, not elsewhere classified, unspecified site: Secondary | ICD-10-CM | POA: Diagnosis not present

## 2015-07-17 DIAGNOSIS — E876 Hypokalemia: Secondary | ICD-10-CM | POA: Diagnosis not present

## 2015-07-17 DIAGNOSIS — Z981 Arthrodesis status: Secondary | ICD-10-CM | POA: Diagnosis not present

## 2015-07-17 DIAGNOSIS — R21 Rash and other nonspecific skin eruption: Secondary | ICD-10-CM | POA: Diagnosis not present

## 2015-07-17 DIAGNOSIS — C9310 Chronic myelomonocytic leukemia not having achieved remission: Secondary | ICD-10-CM | POA: Diagnosis present

## 2015-07-17 DIAGNOSIS — R5381 Other malaise: Secondary | ICD-10-CM | POA: Diagnosis not present

## 2015-07-17 DIAGNOSIS — N183 Chronic kidney disease, stage 3 (moderate): Secondary | ICD-10-CM | POA: Diagnosis not present

## 2015-07-17 DIAGNOSIS — N319 Neuromuscular dysfunction of bladder, unspecified: Secondary | ICD-10-CM | POA: Diagnosis not present

## 2015-07-17 DIAGNOSIS — Z79899 Other long term (current) drug therapy: Secondary | ICD-10-CM | POA: Diagnosis not present

## 2015-07-17 DIAGNOSIS — D72822 Plasmacytosis: Secondary | ICD-10-CM | POA: Diagnosis not present

## 2015-07-17 DIAGNOSIS — M542 Cervicalgia: Secondary | ICD-10-CM | POA: Diagnosis not present

## 2015-07-17 DIAGNOSIS — R52 Pain, unspecified: Secondary | ICD-10-CM | POA: Diagnosis not present

## 2015-07-17 LAB — CULTURE, BLOOD (ROUTINE X 2)

## 2015-07-21 DIAGNOSIS — M109 Gout, unspecified: Secondary | ICD-10-CM | POA: Diagnosis not present

## 2015-07-21 DIAGNOSIS — L511 Stevens-Johnson syndrome: Secondary | ICD-10-CM | POA: Diagnosis not present

## 2015-07-21 DIAGNOSIS — R3981 Functional urinary incontinence: Secondary | ICD-10-CM | POA: Diagnosis not present

## 2015-07-21 DIAGNOSIS — I10 Essential (primary) hypertension: Secondary | ICD-10-CM | POA: Diagnosis not present

## 2015-07-25 DIAGNOSIS — D72829 Elevated white blood cell count, unspecified: Secondary | ICD-10-CM | POA: Diagnosis not present

## 2015-07-25 DIAGNOSIS — R8290 Unspecified abnormal findings in urine: Secondary | ICD-10-CM | POA: Diagnosis not present

## 2015-07-27 DIAGNOSIS — D649 Anemia, unspecified: Secondary | ICD-10-CM | POA: Diagnosis not present

## 2015-07-27 DIAGNOSIS — M109 Gout, unspecified: Secondary | ICD-10-CM | POA: Diagnosis not present

## 2015-07-27 DIAGNOSIS — I10 Essential (primary) hypertension: Secondary | ICD-10-CM | POA: Diagnosis not present

## 2015-07-27 DIAGNOSIS — C9210 Chronic myeloid leukemia, BCR/ABL-positive, not having achieved remission: Secondary | ICD-10-CM | POA: Diagnosis not present

## 2015-07-27 DIAGNOSIS — N390 Urinary tract infection, site not specified: Secondary | ICD-10-CM | POA: Diagnosis not present

## 2015-07-27 DIAGNOSIS — L511 Stevens-Johnson syndrome: Secondary | ICD-10-CM | POA: Diagnosis not present

## 2015-07-31 DIAGNOSIS — M542 Cervicalgia: Secondary | ICD-10-CM | POA: Diagnosis not present

## 2015-07-31 DIAGNOSIS — M47812 Spondylosis without myelopathy or radiculopathy, cervical region: Secondary | ICD-10-CM | POA: Diagnosis not present

## 2015-07-31 DIAGNOSIS — M329 Systemic lupus erythematosus, unspecified: Secondary | ICD-10-CM | POA: Diagnosis not present

## 2015-07-31 DIAGNOSIS — M7550 Bursitis of unspecified shoulder: Secondary | ICD-10-CM | POA: Diagnosis not present

## 2015-07-31 DIAGNOSIS — Z981 Arthrodesis status: Secondary | ICD-10-CM | POA: Diagnosis not present

## 2015-08-01 DIAGNOSIS — I10 Essential (primary) hypertension: Secondary | ICD-10-CM | POA: Diagnosis not present

## 2015-08-01 DIAGNOSIS — M542 Cervicalgia: Secondary | ICD-10-CM | POA: Diagnosis not present

## 2015-08-01 DIAGNOSIS — N390 Urinary tract infection, site not specified: Secondary | ICD-10-CM | POA: Diagnosis not present

## 2015-08-02 DIAGNOSIS — I429 Cardiomyopathy, unspecified: Secondary | ICD-10-CM | POA: Diagnosis not present

## 2015-08-02 DIAGNOSIS — R0602 Shortness of breath: Secondary | ICD-10-CM | POA: Diagnosis not present

## 2015-08-02 DIAGNOSIS — M1A00X0 Idiopathic chronic gout, unspecified site, without tophus (tophi): Secondary | ICD-10-CM | POA: Diagnosis not present

## 2015-08-02 DIAGNOSIS — C9110 Chronic lymphocytic leukemia of B-cell type not having achieved remission: Secondary | ICD-10-CM | POA: Diagnosis not present

## 2015-08-02 DIAGNOSIS — I10 Essential (primary) hypertension: Secondary | ICD-10-CM | POA: Diagnosis not present

## 2015-08-02 DIAGNOSIS — D649 Anemia, unspecified: Secondary | ICD-10-CM | POA: Diagnosis not present

## 2015-08-02 DIAGNOSIS — N183 Chronic kidney disease, stage 3 (moderate): Secondary | ICD-10-CM | POA: Diagnosis not present

## 2015-08-02 DIAGNOSIS — K219 Gastro-esophageal reflux disease without esophagitis: Secondary | ICD-10-CM | POA: Diagnosis not present

## 2015-08-05 DIAGNOSIS — M329 Systemic lupus erythematosus, unspecified: Secondary | ICD-10-CM | POA: Diagnosis not present

## 2015-08-05 DIAGNOSIS — L511 Stevens-Johnson syndrome: Secondary | ICD-10-CM | POA: Diagnosis not present

## 2015-08-05 DIAGNOSIS — K219 Gastro-esophageal reflux disease without esophagitis: Secondary | ICD-10-CM | POA: Diagnosis not present

## 2015-08-05 DIAGNOSIS — M109 Gout, unspecified: Secondary | ICD-10-CM | POA: Diagnosis not present

## 2015-08-05 DIAGNOSIS — R3981 Functional urinary incontinence: Secondary | ICD-10-CM | POA: Diagnosis not present

## 2015-08-05 DIAGNOSIS — I10 Essential (primary) hypertension: Secondary | ICD-10-CM | POA: Diagnosis not present

## 2015-08-07 ENCOUNTER — Other Ambulatory Visit: Payer: Self-pay | Admitting: *Deleted

## 2015-08-07 ENCOUNTER — Inpatient Hospital Stay: Payer: Medicare (Managed Care)

## 2015-08-07 ENCOUNTER — Inpatient Hospital Stay: Payer: Medicare (Managed Care) | Attending: Family Medicine | Admitting: Internal Medicine

## 2015-08-07 VITALS — BP 189/70 | HR 57 | Temp 97.1°F | Resp 20

## 2015-08-07 DIAGNOSIS — N390 Urinary tract infection, site not specified: Secondary | ICD-10-CM | POA: Diagnosis not present

## 2015-08-07 DIAGNOSIS — C9310 Chronic myelomonocytic leukemia not having achieved remission: Secondary | ICD-10-CM

## 2015-08-07 DIAGNOSIS — D649 Anemia, unspecified: Secondary | ICD-10-CM | POA: Diagnosis not present

## 2015-08-07 DIAGNOSIS — D72821 Monocytosis (symptomatic): Secondary | ICD-10-CM | POA: Diagnosis not present

## 2015-08-07 DIAGNOSIS — N189 Chronic kidney disease, unspecified: Secondary | ICD-10-CM | POA: Insufficient documentation

## 2015-08-07 DIAGNOSIS — M542 Cervicalgia: Secondary | ICD-10-CM | POA: Insufficient documentation

## 2015-08-07 DIAGNOSIS — D72829 Elevated white blood cell count, unspecified: Secondary | ICD-10-CM | POA: Insufficient documentation

## 2015-08-07 DIAGNOSIS — R5383 Other fatigue: Secondary | ICD-10-CM | POA: Insufficient documentation

## 2015-08-07 DIAGNOSIS — D696 Thrombocytopenia, unspecified: Secondary | ICD-10-CM

## 2015-08-07 DIAGNOSIS — R21 Rash and other nonspecific skin eruption: Secondary | ICD-10-CM | POA: Insufficient documentation

## 2015-08-07 DIAGNOSIS — M25511 Pain in right shoulder: Secondary | ICD-10-CM

## 2015-08-07 DIAGNOSIS — R0602 Shortness of breath: Secondary | ICD-10-CM | POA: Insufficient documentation

## 2015-08-07 DIAGNOSIS — M50321 Other cervical disc degeneration at C4-C5 level: Secondary | ICD-10-CM | POA: Diagnosis not present

## 2015-08-07 DIAGNOSIS — R29898 Other symptoms and signs involving the musculoskeletal system: Secondary | ICD-10-CM | POA: Diagnosis not present

## 2015-08-07 DIAGNOSIS — M25512 Pain in left shoulder: Secondary | ICD-10-CM

## 2015-08-07 DIAGNOSIS — Z79899 Other long term (current) drug therapy: Secondary | ICD-10-CM | POA: Insufficient documentation

## 2015-08-07 DIAGNOSIS — M8580 Other specified disorders of bone density and structure, unspecified site: Secondary | ICD-10-CM | POA: Diagnosis not present

## 2015-08-07 DIAGNOSIS — I129 Hypertensive chronic kidney disease with stage 1 through stage 4 chronic kidney disease, or unspecified chronic kidney disease: Secondary | ICD-10-CM | POA: Insufficient documentation

## 2015-08-07 DIAGNOSIS — M4802 Spinal stenosis, cervical region: Secondary | ICD-10-CM | POA: Diagnosis not present

## 2015-08-07 LAB — COMPREHENSIVE METABOLIC PANEL
ALK PHOS: 49 U/L (ref 38–126)
ALT: 11 U/L — AB (ref 14–54)
AST: 16 U/L (ref 15–41)
Albumin: 3.6 g/dL (ref 3.5–5.0)
Anion gap: 7 (ref 5–15)
BILIRUBIN TOTAL: 0.5 mg/dL (ref 0.3–1.2)
BUN: 28 mg/dL — AB (ref 6–20)
CALCIUM: 9.3 mg/dL (ref 8.9–10.3)
CHLORIDE: 101 mmol/L (ref 101–111)
CO2: 29 mmol/L (ref 22–32)
CREATININE: 1.09 mg/dL — AB (ref 0.44–1.00)
GFR calc Af Amer: 57 mL/min — ABNORMAL LOW (ref >60)
GFR, EST NON AFRICAN AMERICAN: 49 mL/min — AB (ref >60)
Glucose, Bld: 108 mg/dL — ABNORMAL HIGH (ref 65–99)
Potassium: 4.3 mmol/L (ref 3.5–5.1)
Sodium: 137 mmol/L (ref 135–145)
TOTAL PROTEIN: 7.5 g/dL (ref 6.5–8.1)

## 2015-08-07 LAB — CBC WITH DIFFERENTIAL/PLATELET
BASOS ABS: 0.2 10*3/uL — AB (ref 0–0.1)
Basophils Relative: 2 %
Eosinophils Absolute: 0.2 10*3/uL (ref 0–0.7)
Eosinophils Relative: 2 %
HEMATOCRIT: 29.7 % — AB (ref 35.0–47.0)
HEMOGLOBIN: 9.6 g/dL — AB (ref 12.0–16.0)
LYMPHS ABS: 1.2 10*3/uL (ref 1.0–3.6)
MCH: 28.9 pg (ref 26.0–34.0)
MCHC: 32.4 g/dL (ref 32.0–36.0)
MCV: 89.3 fL (ref 80.0–100.0)
Monocytes Absolute: 3.3 10*3/uL — ABNORMAL HIGH (ref 0.2–0.9)
Monocytes Relative: 29 %
NEUTROS ABS: 6.6 10*3/uL — AB (ref 1.4–6.5)
Neutrophils Relative %: 56 %
Platelets: 106 10*3/uL — ABNORMAL LOW (ref 150–440)
RBC: 3.33 MIL/uL — AB (ref 3.80–5.20)
RDW: 19 % — ABNORMAL HIGH (ref 11.5–14.5)
WBC: 11.5 10*3/uL — AB (ref 3.6–11.0)

## 2015-08-07 LAB — LACTATE DEHYDROGENASE: LDH: 100 U/L (ref 98–192)

## 2015-08-07 NOTE — Progress Notes (Signed)
[place] OFFICE PROGRESS NOTE  Patient Care Team: [name], MD as PCP - General (Family Medicine)  HPI  SUMMARY of HEMATOLOGIC HISTORY:  # ? CMML/leukocytosis with monocytosis chronic; no bone marrow biopsy done  # Thrombocytopenia ? ITP versus others  # Anemia- Question CKD  Chronic kidney disease; debility/walks with a cane   INTERVAL HISTORY:  A very pleasant 74 year old African-American female patient with possible CMML/leukocytosis  and history of anemia with thrombocytopenia is here for follow-up.   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 Currently skin lesions are healed. She is back in the nursing home.  She continues to have chronic shortness of breath/fatigue not new. Patient stated that she is recuperating fairly well at the rehabilitation. She denies any bleeding gums or easy bruising.  REVIEW OF SYSTEMS:   All other systems were reviewed with the patient and are negative.She denies any unusual shortness of breath or cough. She has chronic bilateral shoulder pain/neck pain which is slightly worse.  I have reviewed the past medical history, past surgical history, social history and family history with the patient and they are unchanged from previous note unless stated above.  ALLERGIES:  is allergic to sulfa antibiotics and aspirin.  MEDICATIONS:  Current Outpatient Prescriptions  Medication Sig Dispense Refill  . acetaminophen (TYLENOL) 650 MG CR tablet Take 1 tablet (650 mg total) by mouth every 6 (six) hours. 30 tablet 0  . baclofen (LIORESAL) 10 MG tablet Take 5 mg by mouth at bedtime.    . febuxostat (ULORIC) 40 MG tablet Take 40 mg by mouth daily.     . ferrous gluconate (FERGON) 324 MG tablet Take 324 mg by mouth 2 (two) times daily with a meal.    . fluticasone (FLONASE) 50 MCG/ACT nasal spray Place 1 spray into both nostrils daily.  11  . folic acid  (FOLVITE) 1 MG tablet Take 1 tablet (1 mg total) by mouth daily. 30 tablet 0  . furosemide (LASIX) 20 MG tablet Take 2 tablets (40 mg total) by mouth daily. 30 tablet 11  . lidocaine (LIDODERM) 5 % Place 1 patch onto the skin daily. Remove & Discard patch within 12 hours or as directed by MD. Apply over right shoulder 15 patch 0  . metoprolol tartrate (LOPRESSOR) 25 MG tablet Take 25 mg by mouth 2 (two) times daily.    Marland Kitchen oxybutynin (DITROPAN) 5 MG tablet Take 5 mg by mouth daily.  5  . pantoprazole (PROTONIX) 40 MG tablet Take 40 mg by mouth daily before breakfast.    . potassium chloride SA (K-DUR,KLOR-CON) 20 MEQ tablet Take 20 mEq by mouth daily.  0   No current facility-administered medications for this visit.    PHYSICAL EXAMINATION:   BP 189/70 mmHg  Pulse 57  Temp(Src) 97.1 F (36.2 C) (Tympanic)  Resp 20  SpO2 98%   GENERAL:alert, no distress and comfortable. frail-appearing; she walks with a walker. She is cachectic. She is accompanied by her son. She is in a wheelchair.  EYES: Positive for pallor. OROPHARYNX: Positive for dentures/ulceration. NECK: No thyromegaly LYMPH:  no palpable lymphadenopathy in the cervical, axillary or inguinal LUNGS: clear to auscultation with normal breathing effort; No Wheeze or crackles  Cardio-vascular- Regular Rate & Rythm and no murmurs; one to 2+ bilateral symmetric lower extremity edema ABDOMEN:abdomen soft, non-tender and normal bowel sounds; No hepato-splenomegaly.  Musculoskeletal:no cyanosis of digits and no clubbing  NEURO: alert & oriented x 3 with fluent speech, no focal motor/sensory deficits. SKIN: no skin rash   LABORATORY DATA:  I have reviewed the data as listed    Component Value Date/Time   NA 137 08/07/2015 1155   NA 140 03/14/2014 1150   K 4.3 08/07/2015 1155   K 4.1 04/11/2014 1037   CL 101 08/07/2015 1155   CL 104 03/14/2014 1150   CO2 29 08/07/2015 1155   CO2 28 03/14/2014 1150   GLUCOSE 108* 08/07/2015 1155    GLUCOSE 86 03/14/2014 1150   BUN 28* 08/07/2015 1155   BUN 35* 03/14/2014 1150   CREATININE 1.09* 08/07/2015 1155   CREATININE 1.63* 10/17/2014 1056   CALCIUM 9.3 08/07/2015 1155   CALCIUM 8.8 03/14/2014 1150   PROT 7.5 08/07/2015 1155   PROT 9.1* 06/28/2013 1628   ALBUMIN 3.6 08/07/2015 1155   ALBUMIN 2.6* 06/28/2013 1628   AST 16 08/07/2015 1155   AST 16 06/28/2013 1628   ALT 11* 08/07/2015 1155   ALT 12 06/28/2013 1628   ALKPHOS 49 08/07/2015 1155   ALKPHOS 46 09/27/2013 0929   BILITOT 0.5 08/07/2015 1155   BILITOT 0.3 06/16/2014 1220   GFRNONAA 49* 08/07/2015 1155   GFRNONAA 33* 10/17/2014 1056   GFRNONAA 31* 06/20/2014 1356   GFRAA 57* 08/07/2015 1155   GFRAA 40* 10/17/2014 1056   GFRAA 36* 06/20/2014 1356    No results found for: SPEP, UPEP  Lab Results  Component Value Date   WBC 9.1 07/03/2015   NEUTROABS 4.8 07/03/2015   HGB 9.6* 07/03/2015   HCT 29.4* 07/03/2015   MCV 86.4 07/03/2015   PLT 63* 07/03/2015      Chemistry      Component Value Date/Time   NA 137 08/07/2015 1155   NA 140 03/14/2014 1150   K 4.3 08/07/2015 1155   K 4.1 04/11/2014 1037   CL 101 08/07/2015 1155   CL 104 03/14/2014 1150   CO2 29 08/07/2015 1155   CO2 28 03/14/2014 1150   BUN 28* 08/07/2015 1155   BUN 35* 03/14/2014 1150   CREATININE 1.09* 08/07/2015 1155   CREATININE 1.63* 10/17/2014 1056      Component Value Date/Time   CALCIUM 9.3 08/07/2015 1155   CALCIUM 8.8 03/14/2014 1150   ALKPHOS 49 08/07/2015 1155   ALKPHOS 46 09/27/2013 0929   AST 16 08/07/2015 1155   AST 16 06/28/2013 1628   ALT 11* 08/07/2015 1155   ALT 12 06/28/2013 1628   BILITOT 0.5 08/07/2015 1155   BILITOT 0.3 06/16/2014 1220       RADIOGRAPHIC STUDIES: I have personally reviewed the radiological images as listed and agreed with the findings in the report. No results found.   ASSESSMENT & PLAN:   # Chronic anemia normocytic-unclear etiology possibly from chronic kidney disease ~9;  hemoglobin from today is pending.  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 Most recent white count was 9000 approximately a month ago.  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  question ITP versus primary bone marrow process. Patient is asymptomatic she is not bleeding.   Given multiple hematologic abnormalities I would recommend a bone marrow biopsy for further evaluation. I discussed the procedure in detail along with potential complications. Patient finally agrees to bone marrow biopsy with aspiration. She'll follow-up in the [place] office in approximately 2-3 weeks for a bone marrow biopsy.  # Given the recent skin rash from Bactrim-  we'll add Bactrim to her list of allergies.   The above plan of care was discussed the patient and her son in detail. They agree with the plan.       No orders of the defined types were placed in this encounter.   All questions were answered. The patient knows to call the clinic with any problems, questions or concerns. No barriers to learning was detected.  & I spent 15 minutes counseling the patient face to face. The total time spent in the appointment was 30 minutes and more than 50% was on counseling and review of test results     Cammie Sickle, MD 08/07/2015 12:41 PM

## 2015-08-07 NOTE — Progress Notes (Signed)
Pt is currently at facility~ [facility] for ongoing therapy. Pt states she does walk with a walker and gait belt at facility.Here for ongoing eval of leukocytosis and anemia and thrombocytopenia. Reports appetite is still low. Denies dyspnea or dizziness. No blood noted in stools. Has constipation that is relieved by laxatives. Had a good BM this am. Gave pt samples of boost to take back with her.Pt did not bring a med list from facility with her. Not able to identify her meds currently taking.

## 2015-08-13 DIAGNOSIS — M329 Systemic lupus erythematosus, unspecified: Secondary | ICD-10-CM | POA: Diagnosis not present

## 2015-08-13 DIAGNOSIS — M109 Gout, unspecified: Secondary | ICD-10-CM | POA: Diagnosis not present

## 2015-08-13 DIAGNOSIS — I10 Essential (primary) hypertension: Secondary | ICD-10-CM | POA: Diagnosis not present

## 2015-08-13 DIAGNOSIS — C9210 Chronic myeloid leukemia, BCR/ABL-positive, not having achieved remission: Secondary | ICD-10-CM | POA: Diagnosis not present

## 2015-08-13 DIAGNOSIS — N390 Urinary tract infection, site not specified: Secondary | ICD-10-CM | POA: Diagnosis not present

## 2015-08-13 DIAGNOSIS — D649 Anemia, unspecified: Secondary | ICD-10-CM | POA: Diagnosis not present

## 2015-08-22 ENCOUNTER — Inpatient Hospital Stay: Payer: Medicare Other

## 2015-08-22 ENCOUNTER — Inpatient Hospital Stay: Payer: Medicare Other | Attending: Internal Medicine | Admitting: Internal Medicine

## 2015-08-22 VITALS — BP 162/70 | HR 59 | Temp 98.2°F | Resp 18 | Ht 65.0 in | Wt 117.0 lb

## 2015-08-22 DIAGNOSIS — R0602 Shortness of breath: Secondary | ICD-10-CM | POA: Diagnosis not present

## 2015-08-22 DIAGNOSIS — R5383 Other fatigue: Secondary | ICD-10-CM | POA: Insufficient documentation

## 2015-08-22 DIAGNOSIS — R5381 Other malaise: Secondary | ICD-10-CM | POA: Insufficient documentation

## 2015-08-22 DIAGNOSIS — Z79899 Other long term (current) drug therapy: Secondary | ICD-10-CM | POA: Insufficient documentation

## 2015-08-22 DIAGNOSIS — G8929 Other chronic pain: Secondary | ICD-10-CM | POA: Insufficient documentation

## 2015-08-22 DIAGNOSIS — M25511 Pain in right shoulder: Secondary | ICD-10-CM | POA: Insufficient documentation

## 2015-08-22 DIAGNOSIS — C9310 Chronic myelomonocytic leukemia not having achieved remission: Secondary | ICD-10-CM | POA: Diagnosis not present

## 2015-08-22 DIAGNOSIS — R231 Pallor: Secondary | ICD-10-CM | POA: Insufficient documentation

## 2015-08-22 DIAGNOSIS — N189 Chronic kidney disease, unspecified: Secondary | ICD-10-CM | POA: Insufficient documentation

## 2015-08-22 DIAGNOSIS — M25512 Pain in left shoulder: Secondary | ICD-10-CM | POA: Insufficient documentation

## 2015-08-22 DIAGNOSIS — K121 Other forms of stomatitis: Secondary | ICD-10-CM | POA: Insufficient documentation

## 2015-08-22 LAB — CBC WITH DIFFERENTIAL/PLATELET
Band Neutrophils: 0 %
Basophils Absolute: 0 K/uL (ref 0–0.1)
Basophils Relative: 0 %
Blasts: 0 %
Eosinophils Absolute: 0.1 K/uL (ref 0–0.7)
Eosinophils Relative: 1 %
HCT: 32.8 % — ABNORMAL LOW (ref 35.0–47.0)
Hemoglobin: 10.5 g/dL — ABNORMAL LOW (ref 12.0–16.0)
Lymphocytes Relative: 14 %
Lymphs Abs: 1.2 K/uL (ref 1.0–3.6)
MCH: 29.1 pg (ref 26.0–34.0)
MCHC: 32.1 g/dL (ref 32.0–36.0)
MCV: 90.7 fL (ref 80.0–100.0)
Metamyelocytes Relative: 0 %
Monocytes Absolute: 2.4 K/uL — ABNORMAL HIGH (ref 0.2–0.9)
Monocytes Relative: 29 %
Myelocytes: 0 %
Neutro Abs: 4.6 K/uL (ref 1.4–6.5)
Neutrophils Relative %: 56 %
Other: 0 %
Platelets: 104 K/uL — ABNORMAL LOW (ref 150–440)
Promyelocytes Absolute: 0 %
RBC: 3.62 MIL/uL — ABNORMAL LOW (ref 3.80–5.20)
RDW: 16.5 % — ABNORMAL HIGH (ref 11.5–14.5)
WBC: 8.3 K/uL (ref 3.6–11.0)
nRBC: 0 /100{WBCs}

## 2015-08-22 MED ORDER — LIDOCAINE HCL 2 % IJ SOLN
INTRAMUSCULAR | Status: AC
  Filled 2015-08-22: qty 20

## 2015-08-22 MED ORDER — HEPARIN SOD (PORK) LOCK FLUSH 10 UNIT/ML IV SOLN
INTRAVENOUS | Status: AC
  Filled 2015-08-22: qty 1

## 2015-08-22 MED ORDER — HEPARIN SOD (PORK) LOCK FLUSH 100 UNIT/ML IV SOLN
INTRAVENOUS | Status: AC
  Filled 2015-08-22: qty 5

## 2015-08-22 NOTE — Patient Instructions (Signed)
Bone Marrow Aspiration and Bone Marrow Biopsy °Bone marrow aspiration and bone marrow biopsy are procedures that are done to diagnose blood disorders. You may also have one of these procedures to help diagnose infections or some types of cancer. °Bone marrow is the soft tissue that is inside your bones. Blood cells are produced in bone marrow. For bone marrow aspiration, a sample of tissue in liquid form is removed from inside your bone. For a bone marrow biopsy, a small core of bone marrow tissue is removed. Then these samples are examined under a microscope or tested in a lab. °You may need these procedures if you have an abnormal complete blood count (CBC). The aspiration or biopsy sample is usually taken from the top of your hip bone. Sometimes, an aspiration sample is taken from your chest bone (sternum). °LET YOUR HEALTH CARE PROVIDER KNOW ABOUT: °· Any allergies you have. °· All medicines you are taking, including vitamins, herbs, eye drops, creams, and over-the-counter medicines. °· Previous problems you or members of your family have had with the use of anesthetics. °· Any blood disorders you have. °· Previous surgeries you have had. °· Any medical conditions you may have. °· Whether you are pregnant or you think that you may be pregnant. °RISKS AND COMPLICATIONS °Generally, this is a safe procedure. However, problems may occur, including: °· Infection. °· Bleeding. °BEFORE THE PROCEDURE °· Ask your health care provider about: °¨ Changing or stopping your regular medicines. This is especially important if you are taking diabetes medicines or blood thinners. °¨ Taking medicines such as aspirin and ibuprofen. These medicines can thin your blood. Do not take these medicines before your procedure if your health care provider instructs you not to. °· Plan to have someone take you home after the procedure. °· If you go home right after the procedure, plan to have someone with you for 24 hours. °PROCEDURE  °· An  IV tube may be inserted into one of your veins. °· The injection site will be cleaned with a germ-killing solution (antiseptic). °· You will be given one or more of the following: °¨ A medicine that helps you relax (sedative). °¨ A medicine that numbs the area (local anesthetic). °· The bone marrow sample will be removed as follows: °¨ For an aspiration, a hollow needle will be inserted through your skin and into your bone. Bone marrow fluid will be drawn up into a syringe. °¨ For a biopsy, your health care provider will use a hollow needle to remove a core of tissue from your bone marrow. °· The needle will be removed. °· A bandage (dressing) will be placed over the insertion site and taped in place. °The procedure may vary among health care providers and hospitals. °AFTER THE PROCEDURE °· Your blood pressure, heart rate, breathing rate, and blood oxygen level will be monitored often until the medicines you were given have worn off. °· Return to your normal activities as directed by your health care provider. °  °This information is not intended to replace advice given to you by your health care provider. Make sure you discuss any questions you have with your health care provider. °  °Document Released: 10/02/2004 Document Revised: 02/13/2015 Document Reviewed: 09/20/2014 °Elsevier Interactive Patient Education ©2016 [employer] ° °Bone Marrow Aspiration and Bone Marrow Biopsy, Care After °Refer to this sheet in the next few weeks. These instructions provide you with information about caring for yourself after your procedure. Your health care provider may also give   give you more specific instructions. Your treatment has been planned according to current medical practices, but problems sometimes occur. Call your health care provider if you have any problems or questions after your procedure. WHAT TO EXPECT AFTER THE PROCEDURE After your procedure, it is common to have:  Soreness or tenderness around the puncture  site.  Bruising. HOME CARE INSTRUCTIONS  Take medicines only as directed by your health care provider.  Follow your health care provider's instructions about:  Puncture site care.  Bandage (dressing) changes and removal.  Bathe and shower as directed by your health care provider.  Check your puncture site every day for signs of infection. Watch for:  Redness, swelling, or pain.  Fluid, blood, or pus.  Return to your normal activities as directed by your health care provider.  Keep all follow-up visits as directed by your health care provider. This is important. SEEK MEDICAL CARE IF:  You have a fever.  You have uncontrollable bleeding.  You have redness, swelling, or pain at the site of your puncture.  You have fluid, blood, or pus coming from your puncture site.   This information is not intended to replace advice given to you by your health care provider. Make sure you discuss any questions you have with your health care provider.   Document Released: 04/18/2005 Document Revised: 02/13/2015 Document Reviewed: 09/20/2014 Elsevier Interactive Patient Education 2016 [employer].   Instructions for After Your Bone Marrow Biopsy  1. Rest for 4-6 hours  2. You may shower tonight  3. You may remove the Band-Aid tomorrow morning.    Call Dept: [phone] if you have any signs of the following:  1. Bleeding or large bruising at the bone marrow biopsy site.   2. Fever greater than 101 degrees.  3. Pain or swelling over the hip region.   4. Numbness and/or tingling over the hip or down the leg.

## 2015-08-22 NOTE — Progress Notes (Addendum)
[facility] BONE MARROW BIOPSY/ASPIRATE PROGRESS NOTE  [name] presents for Bone Marrow biopsy per MD orders. [name] verbalized understanding of procedure.  Consent reviewed and signed. [name] positioned left lateral sims for the procedure. Time-out performed and Bone Marrow Checklist completed.  Procedure began at 0945. Xylocaine 2% 20 cc used for local and administered to patient by Dr. Brahmanday. Procedure completed at 1020 am. Patient tolerated well.  She denied any pain after the procedure.  A pressure dressing applied to right iliac crest with instructions to leave in place for 24 hours. Patient instructed to report any bleeding that saturates dressing. Dressing dry and intact to right illiac crest on discharge.  Written instructions were provided to the patient. Patient provided with follow-up appointment. She prefers to go to [place] for the results. Explained to patient that results take at least 7-10 days to result.  Approximately, 15 minutes was spent educating patient about the procedure.  The patient was discharged from the clinic at 1045 am     

## 2015-08-22 NOTE — Progress Notes (Signed)
[place] OFFICE PROGRESS NOTE  Patient Care Team: [name], MD as PCP - General (Family Medicine)  HPI  SUMMARY of HEMATOLOGIC HISTORY:  # ? 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   Patient presents for bone marrow biopsy. She has no other new symptoms at this time.  IREVIEW OF SYSTEMS:   All other systems were reviewed with the patient and are negative.She denies any unusual shortness of breath or cough. She has chronic bilateral shoulder pain/neck pain which is slightly worse.  I have reviewed the past medical history, past surgical history, social history and family history with the patient and they are unchanged from previous note unless stated above.  ALLERGIES:  is allergic to sulfa antibiotics; sulfamethoxazole-trimethoprim; and aspirin.  MEDICATIONS:  Current Outpatient Prescriptions  Medication Sig Dispense Refill  . acetaminophen (TYLENOL) 650 MG CR tablet Take 1 tablet (650 mg total) by mouth every 6 (six) hours. 30 tablet 0  . baclofen (LIORESAL) 10 MG tablet Take 5 mg by mouth at bedtime.    . febuxostat (ULORIC) 40 MG tablet Take 40 mg by mouth daily.     . ferrous gluconate (FERGON) 324 MG tablet Take 324 mg by mouth 2 (two) times daily with a meal.    . fluticasone (FLONASE) 50 MCG/ACT nasal spray Place 1 spray into both nostrils daily.  11  . folic acid (FOLVITE) 1 MG tablet Take 1 tablet (1 mg total) by mouth daily. 30 tablet 0  . furosemide (LASIX) 20 MG tablet Take 2 tablets (40 mg total) by mouth daily. 30 tablet 11  . lidocaine (LIDODERM) 5 % Place 1 patch onto the skin daily. Remove & Discard patch within 12 hours or as directed by  MD. Apply over right shoulder 15 patch 0  . metoprolol tartrate (LOPRESSOR) 25 MG tablet Take 25 mg by mouth 2 (two) times daily.    Marland Kitchen oxybutynin (DITROPAN) 5 MG tablet Take 5 mg by mouth daily.  5  . pantoprazole (PROTONIX) 40 MG tablet Take 40 mg by mouth daily before breakfast.    . potassium chloride SA (K-DUR,KLOR-CON) 20 MEQ tablet Take 20 mEq by mouth daily.  0   No current facility-administered medications for this visit.    PHYSICAL EXAMINATION:   There were no vitals taken for this visit.   GENERAL:alert, no distress and comfortable. frail-appearing; she walks with a walker. She is cachectic.   LABORATORY DATA:  I have reviewed the data as listed    Component Value Date/Time   NA 137 08/07/2015 1155   NA 140 03/14/2014 1150   K 4.3 08/07/2015 1155   K 4.1 04/11/2014 1037   CL 101 08/07/2015 1155   CL 104 03/14/2014 1150   CO2 29 08/07/2015 1155   CO2 28 03/14/2014 1150   GLUCOSE 108* 08/07/2015 1155   GLUCOSE 86 03/14/2014 1150   BUN 28* 08/07/2015 1155   BUN 35* 03/14/2014 1150   CREATININE 1.09* 08/07/2015 1155   CREATININE 1.63* 10/17/2014 1056   CALCIUM 9.3 08/07/2015 1155   CALCIUM 8.8 03/14/2014 1150   PROT 7.5 08/07/2015 1155   PROT 9.1* 06/28/2013 1628   ALBUMIN 3.6 08/07/2015 1155   ALBUMIN 2.6* 06/28/2013 1628  AST 16 08/07/2015 1155   AST 16 06/28/2013 1628   ALT 11* 08/07/2015 1155   ALT 12 06/28/2013 1628   ALKPHOS 49 08/07/2015 1155   ALKPHOS 46 09/27/2013 0929   BILITOT 0.5 08/07/2015 1155   BILITOT 0.3 06/16/2014 1220   GFRNONAA 49* 08/07/2015 1155   GFRNONAA 33* 10/17/2014 1056   GFRNONAA 31* 06/20/2014 1356   GFRAA 57* 08/07/2015 1155   GFRAA 40* 10/17/2014 1056   GFRAA 36* 06/20/2014 1356    No results found for: SPEP, UPEP  Lab Results  Component Value Date   WBC 8.3 08/22/2015   NEUTROABS PENDING 08/22/2015   HGB 10.5* 08/22/2015   HCT 32.8* 08/22/2015   MCV 90.7 08/22/2015   PLT 104* 08/22/2015      Chemistry       Component Value Date/Time   NA 137 08/07/2015 1155   NA 140 03/14/2014 1150   K 4.3 08/07/2015 1155   K 4.1 04/11/2014 1037   CL 101 08/07/2015 1155   CL 104 03/14/2014 1150   CO2 29 08/07/2015 1155   CO2 28 03/14/2014 1150   BUN 28* 08/07/2015 1155   BUN 35* 03/14/2014 1150   CREATININE 1.09* 08/07/2015 1155   CREATININE 1.63* 10/17/2014 1056      Component Value Date/Time   CALCIUM 9.3 08/07/2015 1155   CALCIUM 8.8 03/14/2014 1150   ALKPHOS 49 08/07/2015 1155   ALKPHOS 46 09/27/2013 0929   AST 16 08/07/2015 1155   AST 16 06/28/2013 1628   ALT 11* 08/07/2015 1155   ALT 12 06/28/2013 1628   BILITOT 0.5 08/07/2015 1155   BILITOT 0.3 06/16/2014 1220       RADIOGRAPHIC STUDIES: I have personally reviewed the radiological images as listed and agreed with the findings in the report. No results found.   ASSESSMENT & PLAN:   # Chronic anemia normocytic-unclear etiology possibly from chronic kidney disease ~9; hemoglobin from today is pending.  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 Most recent white count was 9000 approximately a 6-8 weeks ago.  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  question ITP versus primary bone marrow process. Patient is asymptomatic she is not bleeding.   BONE MARROW BIOPSY & ASPIRATION PROCEDURE NOTE:  Indication: Anemia The patient presents for a bone marrow biopsy and aspiration for staging/diagnostic purpose. The procedure and the potential complications [also bleeding infection and more importantly pain] were explained to the patient and consent signed.  Side of procedure: RIGHT  Patient was laid down in the cannon ball position and the posterior iliac crest was identified. After the field was made sterile, local anesthetic was given. After adequate local sedation bone marrow aspiration followed by bone marrow biopsy was done.  The site of bone marrow  biopsy was bandaged. No signs of excessive bleeding was noted. Standard bone marrow biopsy post procedural precautions was discussed with the patient. The patient was asked to let us know if excessive bleeding or redness/excessive tenderness. The patient was asked to avoid NSAIDS.   Patient follow-up with me in approximately 7-10 days to discuss results of the bone marrow biopsy.        No orders of the defined types were placed in this encounter.   All questions were answered. The patient knows to call the clinic with any problems, questions or concerns. No barriers to learning was detected.      Cammie Sickle, MD 08/22/2015 10:06 AM

## 2015-08-22 NOTE — Addendum Note (Signed)
Addended by: Renita Papa R on: 08/22/2015 11:00 AM   Modules accepted: Medications

## 2015-08-23 DIAGNOSIS — M109 Gout, unspecified: Secondary | ICD-10-CM | POA: Diagnosis not present

## 2015-08-23 DIAGNOSIS — R3981 Functional urinary incontinence: Secondary | ICD-10-CM | POA: Diagnosis not present

## 2015-08-23 DIAGNOSIS — D649 Anemia, unspecified: Secondary | ICD-10-CM | POA: Diagnosis not present

## 2015-08-23 DIAGNOSIS — I10 Essential (primary) hypertension: Secondary | ICD-10-CM | POA: Diagnosis not present

## 2015-08-23 DIAGNOSIS — K219 Gastro-esophageal reflux disease without esophagitis: Secondary | ICD-10-CM | POA: Diagnosis not present

## 2015-08-23 DIAGNOSIS — L511 Stevens-Johnson syndrome: Secondary | ICD-10-CM | POA: Diagnosis not present

## 2015-08-30 DIAGNOSIS — M329 Systemic lupus erythematosus, unspecified: Secondary | ICD-10-CM | POA: Diagnosis not present

## 2015-08-30 DIAGNOSIS — N183 Chronic kidney disease, stage 3 (moderate): Secondary | ICD-10-CM | POA: Diagnosis not present

## 2015-08-30 DIAGNOSIS — G8929 Other chronic pain: Secondary | ICD-10-CM | POA: Diagnosis not present

## 2015-08-30 DIAGNOSIS — I129 Hypertensive chronic kidney disease with stage 1 through stage 4 chronic kidney disease, or unspecified chronic kidney disease: Secondary | ICD-10-CM | POA: Diagnosis not present

## 2015-08-30 DIAGNOSIS — R262 Difficulty in walking, not elsewhere classified: Secondary | ICD-10-CM | POA: Diagnosis not present

## 2015-08-30 DIAGNOSIS — D509 Iron deficiency anemia, unspecified: Secondary | ICD-10-CM | POA: Diagnosis not present

## 2015-08-30 DIAGNOSIS — M1000 Idiopathic gout, unspecified site: Secondary | ICD-10-CM | POA: Diagnosis not present

## 2015-08-30 DIAGNOSIS — K219 Gastro-esophageal reflux disease without esophagitis: Secondary | ICD-10-CM | POA: Diagnosis not present

## 2015-08-30 DIAGNOSIS — M150 Primary generalized (osteo)arthritis: Secondary | ICD-10-CM | POA: Diagnosis not present

## 2015-08-30 DIAGNOSIS — Z9181 History of falling: Secondary | ICD-10-CM | POA: Diagnosis not present

## 2015-08-30 DIAGNOSIS — M47812 Spondylosis without myelopathy or radiculopathy, cervical region: Secondary | ICD-10-CM | POA: Diagnosis not present

## 2015-08-31 DIAGNOSIS — G8929 Other chronic pain: Secondary | ICD-10-CM | POA: Diagnosis not present

## 2015-08-31 DIAGNOSIS — M329 Systemic lupus erythematosus, unspecified: Secondary | ICD-10-CM | POA: Diagnosis not present

## 2015-08-31 DIAGNOSIS — I129 Hypertensive chronic kidney disease with stage 1 through stage 4 chronic kidney disease, or unspecified chronic kidney disease: Secondary | ICD-10-CM | POA: Diagnosis not present

## 2015-08-31 DIAGNOSIS — M47812 Spondylosis without myelopathy or radiculopathy, cervical region: Secondary | ICD-10-CM | POA: Diagnosis not present

## 2015-08-31 DIAGNOSIS — M150 Primary generalized (osteo)arthritis: Secondary | ICD-10-CM | POA: Diagnosis not present

## 2015-08-31 DIAGNOSIS — R262 Difficulty in walking, not elsewhere classified: Secondary | ICD-10-CM | POA: Diagnosis not present

## 2015-09-03 ENCOUNTER — Ambulatory Visit: Payer: Medicare Other | Admitting: Internal Medicine

## 2015-09-03 DIAGNOSIS — M150 Primary generalized (osteo)arthritis: Secondary | ICD-10-CM | POA: Diagnosis not present

## 2015-09-03 DIAGNOSIS — M329 Systemic lupus erythematosus, unspecified: Secondary | ICD-10-CM | POA: Diagnosis not present

## 2015-09-03 DIAGNOSIS — R262 Difficulty in walking, not elsewhere classified: Secondary | ICD-10-CM | POA: Diagnosis not present

## 2015-09-03 DIAGNOSIS — I129 Hypertensive chronic kidney disease with stage 1 through stage 4 chronic kidney disease, or unspecified chronic kidney disease: Secondary | ICD-10-CM | POA: Diagnosis not present

## 2015-09-03 DIAGNOSIS — M47812 Spondylosis without myelopathy or radiculopathy, cervical region: Secondary | ICD-10-CM | POA: Diagnosis not present

## 2015-09-03 DIAGNOSIS — G8929 Other chronic pain: Secondary | ICD-10-CM | POA: Diagnosis not present

## 2015-09-04 ENCOUNTER — Inpatient Hospital Stay: Payer: Medicare Other | Attending: Family Medicine | Admitting: Internal Medicine

## 2015-09-04 ENCOUNTER — Encounter: Payer: Self-pay | Admitting: Internal Medicine

## 2015-09-04 VITALS — BP 170/93 | HR 81 | Temp 98.5°F | Wt 113.8 lb

## 2015-09-04 DIAGNOSIS — G8929 Other chronic pain: Secondary | ICD-10-CM | POA: Diagnosis not present

## 2015-09-04 DIAGNOSIS — K121 Other forms of stomatitis: Secondary | ICD-10-CM | POA: Diagnosis not present

## 2015-09-04 DIAGNOSIS — M25511 Pain in right shoulder: Secondary | ICD-10-CM | POA: Diagnosis not present

## 2015-09-04 DIAGNOSIS — R0602 Shortness of breath: Secondary | ICD-10-CM | POA: Insufficient documentation

## 2015-09-04 DIAGNOSIS — Z79899 Other long term (current) drug therapy: Secondary | ICD-10-CM | POA: Insufficient documentation

## 2015-09-04 DIAGNOSIS — C9310 Chronic myelomonocytic leukemia not having achieved remission: Secondary | ICD-10-CM | POA: Insufficient documentation

## 2015-09-04 DIAGNOSIS — R5383 Other fatigue: Secondary | ICD-10-CM | POA: Insufficient documentation

## 2015-09-04 DIAGNOSIS — M25512 Pain in left shoulder: Secondary | ICD-10-CM | POA: Insufficient documentation

## 2015-09-04 DIAGNOSIS — N189 Chronic kidney disease, unspecified: Secondary | ICD-10-CM | POA: Diagnosis not present

## 2015-09-04 DIAGNOSIS — R5381 Other malaise: Secondary | ICD-10-CM | POA: Diagnosis not present

## 2015-09-04 DIAGNOSIS — R231 Pallor: Secondary | ICD-10-CM | POA: Insufficient documentation

## 2015-09-04 NOTE — Progress Notes (Signed)
Patient here today for BM results.  Offers no complaints today.

## 2015-09-04 NOTE — Progress Notes (Signed)
[place] OFFICE PROGRESS NOTE  Patient Care Team: [name], 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 CKD;; debility/walks with a cane   INTERVAL HISTORY:  A very pleasant 74 year old African-American female patient with long-standing history of anemia thrombocytopenia is here to review the results of her bone marrow biopsy. She is accompanied by her daughter/son.  She continues to have chronic shortness of breath/fatigue not new. Patient is currently at home living with her son.. She denies any bleeding gums or easy bruising.  REVIEW OF SYSTEMS:   All other systems were reviewed with the patient and are negative.She denies any unusual shortness of breath or cough. She has chronic bilateral shoulder pain/neck pain which is slightly worse. She complains of difficulty sleeping at night.  I have reviewed the past medical history, past surgical history, social history and family history with the patient and they are unchanged from previous note unless stated above.  ALLERGIES:  is allergic to sulfa antibiotics; sulfamethoxazole-trimethoprim; and aspirin.  MEDICATIONS:  Current Outpatient Prescriptions  Medication Sig Dispense Refill  . acetaminophen (TYLENOL) 650 MG CR tablet Take 1 tablet (650 mg total) by mouth every 6 (six) hours. 30 tablet 0  . baclofen (LIORESAL) 10 MG tablet Take 5 mg by mouth at bedtime.    . febuxostat (ULORIC) 40 MG tablet Take 40 mg by mouth daily.     . ferrous gluconate (FERGON) 324 MG tablet Take 324 mg by mouth 2 (two) times daily with a meal.    . fluticasone (FLONASE) 50 MCG/ACT nasal spray Place 1 spray into both nostrils daily.  11  . folic  acid (FOLVITE) 1 MG tablet Take 1 tablet (1 mg total) by mouth daily. 30 tablet 0  . furosemide (LASIX) 20 MG tablet Take 2 tablets (40 mg total) by mouth daily. 30 tablet 11  . lidocaine (LIDODERM) 5 % Place 1 patch onto the skin daily. Remove & Discard patch within 12 hours or as directed by MD. Apply over right shoulder 15 patch 0  . metoprolol tartrate (LOPRESSOR) 25 MG tablet Take 25 mg by mouth 2 (two) times daily.    Marland Kitchen oxybutynin (DITROPAN) 5 MG tablet Take 5 mg by mouth daily.  5  . pantoprazole (PROTONIX) 40 MG tablet Take 40 mg by mouth daily before breakfast.    . potassium chloride SA (K-DUR,KLOR-CON) 20 MEQ tablet Take 20 mEq by mouth daily.  0   No current facility-administered medications for this visit.    PHYSICAL EXAMINATION:   There were no vitals taken for this visit.   GENERAL:alert, no distress and comfortable. frail-appearing; she walks with cane She is cachectic. She is accompanied by her son/daughter. Marland Kitchen  EYES: Positive for pallor. OROPHARYNX: Positive for dentures/ulceration. NECK: No thyromegaly LYMPH:  no palpable lymphadenopathy in the cervical, axillary or inguinal LUNGS: clear to auscultation with normal breathing effort; No Wheeze or crackles  Cardio-vascular- Regular Rate & Rythm and no murmurs; one to 2+ bilateral symmetric lower extremity edema ABDOMEN:abdomen soft, non-tender and normal bowel sounds; No hepato-splenomegaly.  Musculoskeletal:no cyanosis of digits and no clubbing  NEURO: alert & oriented x 3 with fluent speech, no focal motor/sensory deficits. SKIN: no skin rash   LABORATORY DATA:  I have reviewed the data as listed    Component Value Date/Time   NA 137 08/07/2015 1155   NA 140 03/14/2014 1150   K 4.3 08/07/2015 1155   K 4.1 04/11/2014 1037   CL 101 08/07/2015 1155   CL 104 03/14/2014 1150   CO2 29 08/07/2015 1155   CO2 28 03/14/2014 1150   GLUCOSE 108* 08/07/2015 1155   GLUCOSE 86 03/14/2014 1150   BUN 28* 08/07/2015 1155    BUN 35* 03/14/2014 1150   CREATININE 1.09* 08/07/2015 1155   CREATININE 1.63* 10/17/2014 1056   CALCIUM 9.3 08/07/2015 1155   CALCIUM 8.8 03/14/2014 1150   PROT 7.5 08/07/2015 1155   PROT 9.1* 06/28/2013 1628   ALBUMIN 3.6 08/07/2015 1155   ALBUMIN 2.6* 06/28/2013 1628   AST 16 08/07/2015 1155   AST 16 06/28/2013 1628   ALT 11* 08/07/2015 1155   ALT 12 06/28/2013 1628   ALKPHOS 49 08/07/2015 1155   ALKPHOS 46 09/27/2013 0929   BILITOT 0.5 08/07/2015 1155   BILITOT 0.3 06/16/2014 1220   GFRNONAA 49* 08/07/2015 1155   GFRNONAA 33* 10/17/2014 1056   GFRNONAA 31* 06/20/2014 1356   GFRAA 57* 08/07/2015 1155   GFRAA 40* 10/17/2014 1056   GFRAA 36* 06/20/2014 1356    No results found for: SPEP, UPEP  Lab Results  Component Value Date   WBC 8.3 08/22/2015   NEUTROABS 4.6 08/22/2015   HGB 10.5* 08/22/2015   HCT 32.8* 08/22/2015   MCV 90.7 08/22/2015   PLT 104* 08/22/2015      Chemistry      Component Value Date/Time   NA 137 08/07/2015 1155   NA 140 03/14/2014 1150   K 4.3 08/07/2015 1155   K 4.1 04/11/2014 1037   CL 101 08/07/2015 1155   CL 104 03/14/2014 1150   CO2 29 08/07/2015 1155   CO2 28 03/14/2014 1150   BUN 28* 08/07/2015 1155   BUN 35* 03/14/2014 1150   CREATININE 1.09* 08/07/2015 1155   CREATININE 1.63* 10/17/2014 1056      Component Value Date/Time   CALCIUM 9.3 08/07/2015 1155   CALCIUM 8.8 03/14/2014 1150   ALKPHOS 49 08/07/2015 1155   ALKPHOS 46 09/27/2013 0929   AST 16 08/07/2015 1155   AST 16 06/28/2013 1628   ALT 11* 08/07/2015 1155   ALT 12 06/28/2013 1628   BILITOT 0.5 08/07/2015 1155   BILITOT 0.3 06/16/2014 1220       RADIOGRAPHIC STUDIES: I have personally reviewed the radiological images as listed and agreed with the findings in the report. No results found.   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 Normal cytogenetics. Most recent white count  is within normal limits; hemoglobin between 9-10 platelets 90-100,000. 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  # I discussed this is incurable; however she might not need treatments unless she gets symptomatic/needing transfusions etc. # The potential treatment options were discussed including # hypomethylating agents # supportive care-Aranesp/G-CSF/transfusions as needed.  # For now I do not recommend any therapies- as patient is fairly asymptomatic.; However if she does need treatments especially for anemia- I would recommend starting her Aranesp/especially with her chronic kidney disease.  # I also discussed the importance of flu shot/pneumonia shots/avoiding infections- is the most common cause of mortality.  # Patient will get labs every 6 weeks; follow up with me/labs  in 3 months.  l questions were answered. The patient knows to call the clinic with any problems, questions or concerns. No barriers to learning was detected.The above plan of care was discussed the patient and her son in detail. They agree with the plan.  & I spent 26 minutes counseling the patient face to face. The total time spent in the appointment was 30 minutes and more than 50% was on counseling and review of test results     Cammie Sickle, MD 09/04/2015 8:43 AM

## 2015-09-05 DIAGNOSIS — M329 Systemic lupus erythematosus, unspecified: Secondary | ICD-10-CM | POA: Diagnosis not present

## 2015-09-05 DIAGNOSIS — G8929 Other chronic pain: Secondary | ICD-10-CM | POA: Diagnosis not present

## 2015-09-05 DIAGNOSIS — M47812 Spondylosis without myelopathy or radiculopathy, cervical region: Secondary | ICD-10-CM | POA: Diagnosis not present

## 2015-09-05 DIAGNOSIS — M150 Primary generalized (osteo)arthritis: Secondary | ICD-10-CM | POA: Diagnosis not present

## 2015-09-05 DIAGNOSIS — I129 Hypertensive chronic kidney disease with stage 1 through stage 4 chronic kidney disease, or unspecified chronic kidney disease: Secondary | ICD-10-CM | POA: Diagnosis not present

## 2015-09-05 DIAGNOSIS — R262 Difficulty in walking, not elsewhere classified: Secondary | ICD-10-CM | POA: Diagnosis not present

## 2015-09-07 DIAGNOSIS — M150 Primary generalized (osteo)arthritis: Secondary | ICD-10-CM | POA: Diagnosis not present

## 2015-09-07 DIAGNOSIS — R262 Difficulty in walking, not elsewhere classified: Secondary | ICD-10-CM | POA: Diagnosis not present

## 2015-09-07 DIAGNOSIS — G8929 Other chronic pain: Secondary | ICD-10-CM | POA: Diagnosis not present

## 2015-09-07 DIAGNOSIS — M47812 Spondylosis without myelopathy or radiculopathy, cervical region: Secondary | ICD-10-CM | POA: Diagnosis not present

## 2015-09-07 DIAGNOSIS — I129 Hypertensive chronic kidney disease with stage 1 through stage 4 chronic kidney disease, or unspecified chronic kidney disease: Secondary | ICD-10-CM | POA: Diagnosis not present

## 2015-09-07 DIAGNOSIS — M329 Systemic lupus erythematosus, unspecified: Secondary | ICD-10-CM | POA: Diagnosis not present

## 2015-09-08 DIAGNOSIS — M150 Primary generalized (osteo)arthritis: Secondary | ICD-10-CM | POA: Diagnosis not present

## 2015-09-08 DIAGNOSIS — M329 Systemic lupus erythematosus, unspecified: Secondary | ICD-10-CM | POA: Diagnosis not present

## 2015-09-08 DIAGNOSIS — M47812 Spondylosis without myelopathy or radiculopathy, cervical region: Secondary | ICD-10-CM | POA: Diagnosis not present

## 2015-09-08 DIAGNOSIS — R262 Difficulty in walking, not elsewhere classified: Secondary | ICD-10-CM | POA: Diagnosis not present

## 2015-09-08 DIAGNOSIS — I129 Hypertensive chronic kidney disease with stage 1 through stage 4 chronic kidney disease, or unspecified chronic kidney disease: Secondary | ICD-10-CM | POA: Diagnosis not present

## 2015-09-08 DIAGNOSIS — G8929 Other chronic pain: Secondary | ICD-10-CM | POA: Diagnosis not present

## 2015-09-10 DIAGNOSIS — R7881 Bacteremia: Secondary | ICD-10-CM | POA: Diagnosis not present

## 2015-09-10 DIAGNOSIS — R29898 Other symptoms and signs involving the musculoskeletal system: Secondary | ICD-10-CM | POA: Diagnosis not present

## 2015-09-10 DIAGNOSIS — R937 Abnormal findings on diagnostic imaging of other parts of musculoskeletal system: Secondary | ICD-10-CM | POA: Diagnosis not present

## 2015-09-10 DIAGNOSIS — M542 Cervicalgia: Secondary | ICD-10-CM | POA: Diagnosis not present

## 2015-09-10 DIAGNOSIS — M329 Systemic lupus erythematosus, unspecified: Secondary | ICD-10-CM | POA: Diagnosis not present

## 2015-09-10 DIAGNOSIS — M4852XA Collapsed vertebra, not elsewhere classified, cervical region, initial encounter for fracture: Secondary | ICD-10-CM | POA: Diagnosis not present

## 2015-09-10 DIAGNOSIS — G8929 Other chronic pain: Secondary | ICD-10-CM | POA: Diagnosis not present

## 2015-09-11 DIAGNOSIS — G8929 Other chronic pain: Secondary | ICD-10-CM | POA: Diagnosis not present

## 2015-09-11 DIAGNOSIS — D649 Anemia, unspecified: Secondary | ICD-10-CM | POA: Diagnosis present

## 2015-09-11 DIAGNOSIS — M4852XA Collapsed vertebra, not elsewhere classified, cervical region, initial encounter for fracture: Secondary | ICD-10-CM | POA: Diagnosis not present

## 2015-09-11 DIAGNOSIS — M329 Systemic lupus erythematosus, unspecified: Secondary | ICD-10-CM | POA: Diagnosis present

## 2015-09-11 DIAGNOSIS — R32 Unspecified urinary incontinence: Secondary | ICD-10-CM | POA: Diagnosis present

## 2015-09-11 DIAGNOSIS — E039 Hypothyroidism, unspecified: Secondary | ICD-10-CM | POA: Diagnosis present

## 2015-09-11 DIAGNOSIS — M150 Primary generalized (osteo)arthritis: Secondary | ICD-10-CM | POA: Diagnosis not present

## 2015-09-11 DIAGNOSIS — M40292 Other kyphosis, cervical region: Secondary | ICD-10-CM | POA: Diagnosis present

## 2015-09-11 DIAGNOSIS — N183 Chronic kidney disease, stage 3 (moderate): Secondary | ICD-10-CM | POA: Diagnosis present

## 2015-09-11 DIAGNOSIS — M109 Gout, unspecified: Secondary | ICD-10-CM | POA: Diagnosis present

## 2015-09-11 DIAGNOSIS — I129 Hypertensive chronic kidney disease with stage 1 through stage 4 chronic kidney disease, or unspecified chronic kidney disease: Secondary | ICD-10-CM | POA: Diagnosis present

## 2015-09-11 DIAGNOSIS — K219 Gastro-esophageal reflux disease without esophagitis: Secondary | ICD-10-CM | POA: Diagnosis present

## 2015-09-11 DIAGNOSIS — Y33XXXA Other specified events, undetermined intent, initial encounter: Secondary | ICD-10-CM | POA: Diagnosis not present

## 2015-09-11 DIAGNOSIS — R262 Difficulty in walking, not elsewhere classified: Secondary | ICD-10-CM | POA: Diagnosis not present

## 2015-09-11 DIAGNOSIS — C9210 Chronic myeloid leukemia, BCR/ABL-positive, not having achieved remission: Secondary | ICD-10-CM | POA: Diagnosis present

## 2015-09-11 DIAGNOSIS — M47812 Spondylosis without myelopathy or radiculopathy, cervical region: Secondary | ICD-10-CM | POA: Diagnosis present

## 2015-09-11 DIAGNOSIS — M4802 Spinal stenosis, cervical region: Secondary | ICD-10-CM | POA: Diagnosis not present

## 2015-09-17 ENCOUNTER — Telehealth: Payer: Self-pay | Admitting: Internal Medicine

## 2015-09-17 NOTE — Telephone Encounter (Signed)
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   Patient was in [facility] hospital last week and had an MRI. She has 2 C-spine compression fractures and they want to do surgery. They would like to see Dr. Rogue Bussing first to get his opinion/advice. Can they get in to see him tomorrow in [place] (09/18/15)? Please advise. Thanks.

## 2015-09-17 NOTE — Telephone Encounter (Signed)
She was in [place] last week and had an MRI.

## 2015-09-17 NOTE — Telephone Encounter (Signed)
Have patient come at 9:45am tomorrow per Dr. Rogue Bussing

## 2015-09-18 ENCOUNTER — Ambulatory Visit: Payer: Medicare Other | Admitting: Internal Medicine

## 2015-09-20 DIAGNOSIS — N390 Urinary tract infection, site not specified: Secondary | ICD-10-CM | POA: Diagnosis not present

## 2015-09-20 DIAGNOSIS — I10 Essential (primary) hypertension: Secondary | ICD-10-CM | POA: Diagnosis not present

## 2015-09-20 DIAGNOSIS — C9210 Chronic myeloid leukemia, BCR/ABL-positive, not having achieved remission: Secondary | ICD-10-CM | POA: Diagnosis not present

## 2015-09-20 DIAGNOSIS — M4852XS Collapsed vertebra, not elsewhere classified, cervical region, sequela of fracture: Secondary | ICD-10-CM | POA: Diagnosis not present

## 2015-09-21 DIAGNOSIS — M329 Systemic lupus erythematosus, unspecified: Secondary | ICD-10-CM | POA: Diagnosis not present

## 2015-09-21 DIAGNOSIS — M47812 Spondylosis without myelopathy or radiculopathy, cervical region: Secondary | ICD-10-CM | POA: Diagnosis not present

## 2015-09-21 DIAGNOSIS — M150 Primary generalized (osteo)arthritis: Secondary | ICD-10-CM | POA: Diagnosis not present

## 2015-09-21 DIAGNOSIS — R262 Difficulty in walking, not elsewhere classified: Secondary | ICD-10-CM | POA: Diagnosis not present

## 2015-09-21 DIAGNOSIS — I129 Hypertensive chronic kidney disease with stage 1 through stage 4 chronic kidney disease, or unspecified chronic kidney disease: Secondary | ICD-10-CM | POA: Diagnosis not present

## 2015-09-21 DIAGNOSIS — G8929 Other chronic pain: Secondary | ICD-10-CM | POA: Diagnosis not present

## 2015-09-25 DIAGNOSIS — I129 Hypertensive chronic kidney disease with stage 1 through stage 4 chronic kidney disease, or unspecified chronic kidney disease: Secondary | ICD-10-CM | POA: Diagnosis not present

## 2015-09-25 DIAGNOSIS — M150 Primary generalized (osteo)arthritis: Secondary | ICD-10-CM | POA: Diagnosis not present

## 2015-09-25 DIAGNOSIS — R262 Difficulty in walking, not elsewhere classified: Secondary | ICD-10-CM | POA: Diagnosis not present

## 2015-09-25 DIAGNOSIS — M47812 Spondylosis without myelopathy or radiculopathy, cervical region: Secondary | ICD-10-CM | POA: Diagnosis not present

## 2015-09-25 DIAGNOSIS — M329 Systemic lupus erythematosus, unspecified: Secondary | ICD-10-CM | POA: Diagnosis not present

## 2015-09-25 DIAGNOSIS — G8929 Other chronic pain: Secondary | ICD-10-CM | POA: Diagnosis not present

## 2015-09-27 DIAGNOSIS — M47812 Spondylosis without myelopathy or radiculopathy, cervical region: Secondary | ICD-10-CM | POA: Diagnosis not present

## 2015-09-27 DIAGNOSIS — R262 Difficulty in walking, not elsewhere classified: Secondary | ICD-10-CM | POA: Diagnosis not present

## 2015-09-27 DIAGNOSIS — G8929 Other chronic pain: Secondary | ICD-10-CM | POA: Diagnosis not present

## 2015-09-27 DIAGNOSIS — M150 Primary generalized (osteo)arthritis: Secondary | ICD-10-CM | POA: Diagnosis not present

## 2015-09-27 DIAGNOSIS — I129 Hypertensive chronic kidney disease with stage 1 through stage 4 chronic kidney disease, or unspecified chronic kidney disease: Secondary | ICD-10-CM | POA: Diagnosis not present

## 2015-09-27 DIAGNOSIS — M329 Systemic lupus erythematosus, unspecified: Secondary | ICD-10-CM | POA: Diagnosis not present

## 2015-09-28 DIAGNOSIS — M329 Systemic lupus erythematosus, unspecified: Secondary | ICD-10-CM | POA: Diagnosis not present

## 2015-09-28 DIAGNOSIS — I129 Hypertensive chronic kidney disease with stage 1 through stage 4 chronic kidney disease, or unspecified chronic kidney disease: Secondary | ICD-10-CM | POA: Diagnosis not present

## 2015-09-28 DIAGNOSIS — M150 Primary generalized (osteo)arthritis: Secondary | ICD-10-CM | POA: Diagnosis not present

## 2015-09-28 DIAGNOSIS — M47812 Spondylosis without myelopathy or radiculopathy, cervical region: Secondary | ICD-10-CM | POA: Diagnosis not present

## 2015-09-28 DIAGNOSIS — G8929 Other chronic pain: Secondary | ICD-10-CM | POA: Diagnosis not present

## 2015-09-28 DIAGNOSIS — R262 Difficulty in walking, not elsewhere classified: Secondary | ICD-10-CM | POA: Diagnosis not present

## 2015-10-01 DIAGNOSIS — G8929 Other chronic pain: Secondary | ICD-10-CM | POA: Diagnosis not present

## 2015-10-01 DIAGNOSIS — M47812 Spondylosis without myelopathy or radiculopathy, cervical region: Secondary | ICD-10-CM | POA: Diagnosis not present

## 2015-10-01 DIAGNOSIS — M329 Systemic lupus erythematosus, unspecified: Secondary | ICD-10-CM | POA: Diagnosis not present

## 2015-10-01 DIAGNOSIS — M150 Primary generalized (osteo)arthritis: Secondary | ICD-10-CM | POA: Diagnosis not present

## 2015-10-01 DIAGNOSIS — I129 Hypertensive chronic kidney disease with stage 1 through stage 4 chronic kidney disease, or unspecified chronic kidney disease: Secondary | ICD-10-CM | POA: Diagnosis not present

## 2015-10-01 DIAGNOSIS — R262 Difficulty in walking, not elsewhere classified: Secondary | ICD-10-CM | POA: Diagnosis not present

## 2015-10-02 DIAGNOSIS — M150 Primary generalized (osteo)arthritis: Secondary | ICD-10-CM | POA: Diagnosis not present

## 2015-10-02 DIAGNOSIS — M47812 Spondylosis without myelopathy or radiculopathy, cervical region: Secondary | ICD-10-CM | POA: Diagnosis not present

## 2015-10-02 DIAGNOSIS — R262 Difficulty in walking, not elsewhere classified: Secondary | ICD-10-CM | POA: Diagnosis not present

## 2015-10-02 DIAGNOSIS — I129 Hypertensive chronic kidney disease with stage 1 through stage 4 chronic kidney disease, or unspecified chronic kidney disease: Secondary | ICD-10-CM | POA: Diagnosis not present

## 2015-10-02 DIAGNOSIS — G8929 Other chronic pain: Secondary | ICD-10-CM | POA: Diagnosis not present

## 2015-10-02 DIAGNOSIS — M329 Systemic lupus erythematosus, unspecified: Secondary | ICD-10-CM | POA: Diagnosis not present

## 2015-10-04 DIAGNOSIS — I129 Hypertensive chronic kidney disease with stage 1 through stage 4 chronic kidney disease, or unspecified chronic kidney disease: Secondary | ICD-10-CM | POA: Diagnosis not present

## 2015-10-04 DIAGNOSIS — M150 Primary generalized (osteo)arthritis: Secondary | ICD-10-CM | POA: Diagnosis not present

## 2015-10-04 DIAGNOSIS — M47812 Spondylosis without myelopathy or radiculopathy, cervical region: Secondary | ICD-10-CM | POA: Diagnosis not present

## 2015-10-04 DIAGNOSIS — R262 Difficulty in walking, not elsewhere classified: Secondary | ICD-10-CM | POA: Diagnosis not present

## 2015-10-04 DIAGNOSIS — M329 Systemic lupus erythematosus, unspecified: Secondary | ICD-10-CM | POA: Diagnosis not present

## 2015-10-04 DIAGNOSIS — G8929 Other chronic pain: Secondary | ICD-10-CM | POA: Diagnosis not present

## 2015-10-05 DIAGNOSIS — M329 Systemic lupus erythematosus, unspecified: Secondary | ICD-10-CM | POA: Diagnosis not present

## 2015-10-05 DIAGNOSIS — I129 Hypertensive chronic kidney disease with stage 1 through stage 4 chronic kidney disease, or unspecified chronic kidney disease: Secondary | ICD-10-CM | POA: Diagnosis not present

## 2015-10-05 DIAGNOSIS — G8929 Other chronic pain: Secondary | ICD-10-CM | POA: Diagnosis not present

## 2015-10-05 DIAGNOSIS — M150 Primary generalized (osteo)arthritis: Secondary | ICD-10-CM | POA: Diagnosis not present

## 2015-10-05 DIAGNOSIS — M47812 Spondylosis without myelopathy or radiculopathy, cervical region: Secondary | ICD-10-CM | POA: Diagnosis not present

## 2015-10-05 DIAGNOSIS — R262 Difficulty in walking, not elsewhere classified: Secondary | ICD-10-CM | POA: Diagnosis not present

## 2015-10-10 DIAGNOSIS — M329 Systemic lupus erythematosus, unspecified: Secondary | ICD-10-CM | POA: Diagnosis not present

## 2015-10-10 DIAGNOSIS — M150 Primary generalized (osteo)arthritis: Secondary | ICD-10-CM | POA: Diagnosis not present

## 2015-10-10 DIAGNOSIS — G8929 Other chronic pain: Secondary | ICD-10-CM | POA: Diagnosis not present

## 2015-10-10 DIAGNOSIS — I129 Hypertensive chronic kidney disease with stage 1 through stage 4 chronic kidney disease, or unspecified chronic kidney disease: Secondary | ICD-10-CM | POA: Diagnosis not present

## 2015-10-10 DIAGNOSIS — M47812 Spondylosis without myelopathy or radiculopathy, cervical region: Secondary | ICD-10-CM | POA: Diagnosis not present

## 2015-10-10 DIAGNOSIS — R262 Difficulty in walking, not elsewhere classified: Secondary | ICD-10-CM | POA: Diagnosis not present

## 2015-10-11 DIAGNOSIS — R262 Difficulty in walking, not elsewhere classified: Secondary | ICD-10-CM | POA: Diagnosis not present

## 2015-10-11 DIAGNOSIS — M329 Systemic lupus erythematosus, unspecified: Secondary | ICD-10-CM | POA: Diagnosis not present

## 2015-10-11 DIAGNOSIS — G8929 Other chronic pain: Secondary | ICD-10-CM | POA: Diagnosis not present

## 2015-10-11 DIAGNOSIS — M47812 Spondylosis without myelopathy or radiculopathy, cervical region: Secondary | ICD-10-CM | POA: Diagnosis not present

## 2015-10-11 DIAGNOSIS — I129 Hypertensive chronic kidney disease with stage 1 through stage 4 chronic kidney disease, or unspecified chronic kidney disease: Secondary | ICD-10-CM | POA: Diagnosis not present

## 2015-10-11 DIAGNOSIS — M150 Primary generalized (osteo)arthritis: Secondary | ICD-10-CM | POA: Diagnosis not present

## 2015-10-16 ENCOUNTER — Inpatient Hospital Stay: Payer: Medicare (Managed Care) | Attending: Family Medicine

## 2015-10-16 DIAGNOSIS — C9310 Chronic myelomonocytic leukemia not having achieved remission: Secondary | ICD-10-CM | POA: Diagnosis not present

## 2015-10-16 LAB — CBC WITH DIFFERENTIAL/PLATELET
Basophils Absolute: 0.2 10*3/uL — ABNORMAL HIGH (ref 0–0.1)
Basophils Relative: 2 %
EOS ABS: 0.1 10*3/uL (ref 0–0.7)
Eosinophils Relative: 1 %
HCT: 38.8 % (ref 35.0–47.0)
HEMOGLOBIN: 12.4 g/dL (ref 12.0–16.0)
LYMPHS ABS: 1.5 10*3/uL (ref 1.0–3.6)
LYMPHS PCT: 15 %
MCH: 28.6 pg (ref 26.0–34.0)
MCHC: 31.9 g/dL — ABNORMAL LOW (ref 32.0–36.0)
MCV: 89.8 fL (ref 80.0–100.0)
Monocytes Absolute: 2.2 10*3/uL — ABNORMAL HIGH (ref 0.2–0.9)
Monocytes Relative: 22 %
NEUTROS ABS: 6.1 10*3/uL (ref 1.4–6.5)
NEUTROS PCT: 60 %
Platelets: 108 10*3/uL — ABNORMAL LOW (ref 150–440)
RBC: 4.32 MIL/uL (ref 3.80–5.20)
RDW: 14.2 % (ref 11.5–14.5)
WBC: 10.1 10*3/uL (ref 3.6–11.0)

## 2015-10-16 LAB — BASIC METABOLIC PANEL
Anion gap: 6 (ref 5–15)
BUN: 28 mg/dL — AB (ref 6–20)
CHLORIDE: 104 mmol/L (ref 101–111)
CO2: 29 mmol/L (ref 22–32)
Calcium: 9.1 mg/dL (ref 8.9–10.3)
Creatinine, Ser: 1.04 mg/dL — ABNORMAL HIGH (ref 0.44–1.00)
GFR calc Af Amer: >60 mL/min (ref >60)
GFR calc non Af Amer: 52 mL/min — ABNORMAL LOW (ref >60)
Glucose, Bld: 100 mg/dL — ABNORMAL HIGH (ref 65–99)
POTASSIUM: 3.8 mmol/L (ref 3.5–5.1)
SODIUM: 139 mmol/L (ref 135–145)

## 2015-10-17 DIAGNOSIS — M329 Systemic lupus erythematosus, unspecified: Secondary | ICD-10-CM | POA: Diagnosis not present

## 2015-10-17 DIAGNOSIS — G8929 Other chronic pain: Secondary | ICD-10-CM | POA: Diagnosis not present

## 2015-10-17 DIAGNOSIS — M150 Primary generalized (osteo)arthritis: Secondary | ICD-10-CM | POA: Diagnosis not present

## 2015-10-17 DIAGNOSIS — R262 Difficulty in walking, not elsewhere classified: Secondary | ICD-10-CM | POA: Diagnosis not present

## 2015-10-17 DIAGNOSIS — M47812 Spondylosis without myelopathy or radiculopathy, cervical region: Secondary | ICD-10-CM | POA: Diagnosis not present

## 2015-10-17 DIAGNOSIS — I129 Hypertensive chronic kidney disease with stage 1 through stage 4 chronic kidney disease, or unspecified chronic kidney disease: Secondary | ICD-10-CM | POA: Diagnosis not present

## 2015-10-18 DIAGNOSIS — R262 Difficulty in walking, not elsewhere classified: Secondary | ICD-10-CM | POA: Diagnosis not present

## 2015-10-18 DIAGNOSIS — M47812 Spondylosis without myelopathy or radiculopathy, cervical region: Secondary | ICD-10-CM | POA: Diagnosis not present

## 2015-10-18 DIAGNOSIS — M329 Systemic lupus erythematosus, unspecified: Secondary | ICD-10-CM | POA: Diagnosis not present

## 2015-10-18 DIAGNOSIS — G8929 Other chronic pain: Secondary | ICD-10-CM | POA: Diagnosis not present

## 2015-10-18 DIAGNOSIS — I129 Hypertensive chronic kidney disease with stage 1 through stage 4 chronic kidney disease, or unspecified chronic kidney disease: Secondary | ICD-10-CM | POA: Diagnosis not present

## 2015-10-18 DIAGNOSIS — M150 Primary generalized (osteo)arthritis: Secondary | ICD-10-CM | POA: Diagnosis not present

## 2015-10-19 DIAGNOSIS — M47812 Spondylosis without myelopathy or radiculopathy, cervical region: Secondary | ICD-10-CM | POA: Diagnosis not present

## 2015-10-19 DIAGNOSIS — R262 Difficulty in walking, not elsewhere classified: Secondary | ICD-10-CM | POA: Diagnosis not present

## 2015-10-19 DIAGNOSIS — M150 Primary generalized (osteo)arthritis: Secondary | ICD-10-CM | POA: Diagnosis not present

## 2015-10-19 DIAGNOSIS — I129 Hypertensive chronic kidney disease with stage 1 through stage 4 chronic kidney disease, or unspecified chronic kidney disease: Secondary | ICD-10-CM | POA: Diagnosis not present

## 2015-10-19 DIAGNOSIS — G8929 Other chronic pain: Secondary | ICD-10-CM | POA: Diagnosis not present

## 2015-10-19 DIAGNOSIS — M329 Systemic lupus erythematosus, unspecified: Secondary | ICD-10-CM | POA: Diagnosis not present

## 2015-11-29 DIAGNOSIS — M40292 Other kyphosis, cervical region: Secondary | ICD-10-CM | POA: Diagnosis not present

## 2015-12-04 ENCOUNTER — Inpatient Hospital Stay: Payer: Medicare (Managed Care) | Admitting: Internal Medicine

## 2015-12-04 ENCOUNTER — Inpatient Hospital Stay: Payer: Medicare (Managed Care)

## 2015-12-04 ENCOUNTER — Encounter: Payer: Self-pay | Admitting: *Deleted

## 2015-12-04 NOTE — Progress Notes (Signed)
No show today in [place]. Msg sent to cancer center scheduling to r/s this apt with Dr. Rogue Bussing.

## 2015-12-11 ENCOUNTER — Other Ambulatory Visit: Payer: Medicare (Managed Care)

## 2015-12-11 ENCOUNTER — Ambulatory Visit: Payer: Medicare (Managed Care) | Admitting: Internal Medicine

## 2015-12-18 ENCOUNTER — Inpatient Hospital Stay: Payer: Medicare Other | Attending: Internal Medicine

## 2015-12-18 ENCOUNTER — Inpatient Hospital Stay (HOSPITAL_BASED_OUTPATIENT_CLINIC_OR_DEPARTMENT_OTHER): Payer: Medicare Other | Admitting: Internal Medicine

## 2015-12-18 VITALS — BP 175/85 | HR 82 | Temp 98.2°F | Resp 20 | Wt 117.5 lb

## 2015-12-18 DIAGNOSIS — Z79899 Other long term (current) drug therapy: Secondary | ICD-10-CM | POA: Diagnosis not present

## 2015-12-18 DIAGNOSIS — C9310 Chronic myelomonocytic leukemia not having achieved remission: Secondary | ICD-10-CM

## 2015-12-18 DIAGNOSIS — I129 Hypertensive chronic kidney disease with stage 1 through stage 4 chronic kidney disease, or unspecified chronic kidney disease: Secondary | ICD-10-CM | POA: Diagnosis not present

## 2015-12-18 DIAGNOSIS — C9210 Chronic myeloid leukemia, BCR/ABL-positive, not having achieved remission: Secondary | ICD-10-CM | POA: Diagnosis not present

## 2015-12-18 DIAGNOSIS — R0602 Shortness of breath: Secondary | ICD-10-CM

## 2015-12-18 DIAGNOSIS — C9312 Chronic myelomonocytic leukemia, in relapse: Secondary | ICD-10-CM

## 2015-12-18 DIAGNOSIS — D631 Anemia in chronic kidney disease: Secondary | ICD-10-CM | POA: Diagnosis not present

## 2015-12-18 DIAGNOSIS — M542 Cervicalgia: Secondary | ICD-10-CM | POA: Insufficient documentation

## 2015-12-18 DIAGNOSIS — N189 Chronic kidney disease, unspecified: Secondary | ICD-10-CM | POA: Diagnosis not present

## 2015-12-18 LAB — COMPREHENSIVE METABOLIC PANEL
ALK PHOS: 58 U/L (ref 38–126)
ALT: 26 U/L (ref 14–54)
ANION GAP: 5 (ref 5–15)
AST: 23 U/L (ref 15–41)
Albumin: 3.5 g/dL (ref 3.5–5.0)
BILIRUBIN TOTAL: 0.6 mg/dL (ref 0.3–1.2)
BUN: 32 mg/dL — ABNORMAL HIGH (ref 6–20)
CALCIUM: 8.7 mg/dL — AB (ref 8.9–10.3)
CO2: 30 mmol/L (ref 22–32)
CREATININE: 1.15 mg/dL — AB (ref 0.44–1.00)
Chloride: 101 mmol/L (ref 101–111)
GFR, EST AFRICAN AMERICAN: 54 mL/min — AB (ref >60)
GFR, EST NON AFRICAN AMERICAN: 46 mL/min — AB (ref >60)
Glucose, Bld: 132 mg/dL — ABNORMAL HIGH (ref 65–99)
Potassium: 3.8 mmol/L (ref 3.5–5.1)
Sodium: 136 mmol/L (ref 135–145)
TOTAL PROTEIN: 7.7 g/dL (ref 6.5–8.1)

## 2015-12-18 LAB — CBC WITH DIFFERENTIAL/PLATELET
BASOS ABS: 0 10*3/uL (ref 0–0.1)
BLASTS: 0 %
Band Neutrophils: 0 %
Basophils Relative: 0 %
EOS PCT: 0 %
Eosinophils Absolute: 0 10*3/uL (ref 0–0.7)
HEMATOCRIT: 38.8 % (ref 35.0–47.0)
HEMOGLOBIN: 12.4 g/dL (ref 12.0–16.0)
LYMPHS ABS: 1.7 10*3/uL (ref 1.0–3.6)
Lymphocytes Relative: 15 %
MCH: 28.4 pg (ref 26.0–34.0)
MCHC: 31.9 g/dL — AB (ref 32.0–36.0)
MCV: 89 fL (ref 80.0–100.0)
METAMYELOCYTES PCT: 0 %
MYELOCYTES: 0 %
Monocytes Absolute: 2.8 10*3/uL — ABNORMAL HIGH (ref 0.2–0.9)
Monocytes Relative: 24 %
NEUTROS PCT: 61 %
NRBC: 0 /100{WBCs}
Neutro Abs: 7 10*3/uL — ABNORMAL HIGH (ref 1.4–6.5)
Other: 0 %
PROMYELOCYTES ABS: 0 %
Platelets: 125 10*3/uL — ABNORMAL LOW (ref 150–440)
RBC: 4.36 MIL/uL (ref 3.80–5.20)
RDW: 15.3 % — ABNORMAL HIGH (ref 11.5–14.5)
WBC: 11.5 10*3/uL — AB (ref 3.6–11.0)

## 2015-12-18 NOTE — Progress Notes (Signed)
Pt here for routine follow up CML and anemia. Continues to wear cervical collar. Reports appetite is good. Energy is fair. Only pain she has is in her neck. Rates it a 3/10 today. No LE edema. No dyspnea. Denies dizziness. No blood in stools.

## 2015-12-18 NOTE — Progress Notes (Signed)
[name], [name]; [name]; atypical monocytes with multilinege dysplasia; <5% blasts; [name];  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; [name]; [name]  REVIEW OF SYSTEMS:  A complete 10 point review of system is done which is negative except mentioned above in history of present illness.   I have reviewed the past medical history, past surgical history, social history and family history with the patient and they are unchanged from previous note unless stated above.  ALLERGIES:  is allergic to sulfa antibiotics; sulfamethoxazole-trimethoprim; and aspirin.  MEDICATIONS:  Current Outpatient Prescriptions  Medication Sig Dispense Refill  . acetaminophen (TYLENOL) 650 MG CR tablet Take 1 tablet (650 mg total) by mouth every 6 (six) hours. 30 tablet 0  . baclofen (LIORESAL) 10 MG tablet Take 5 mg by mouth at bedtime.    . Calcium Carbonate-Vitamin D (CALCIUM-VITAMIN D) 500-200 MG-UNIT tablet Take 1 tablet by mouth daily.    . Cholecalciferol (VITAMIN D3) 1000 units CAPS Take 1 capsule by mouth 1 day or 1 dose.    . docusate sodium (COLACE) 100 MG capsule Take 100 mg by mouth 2 (two) times daily.    .  febuxostat (ULORIC) 40 MG tablet Take 40 mg by mouth daily.     . folic acid (FOLVITE) 1 MG tablet Take 1 tablet (1 mg total) by mouth daily. 30 tablet 0  . hydroxychloroquine (PLAQUENIL) 200 MG tablet Take 200 mg by mouth daily.    . Multiple Vitamins-Minerals (ICAPS) CAPS Take 1 capsule by mouth 1 day or 1 dose.    . oxybutynin (DITROPAN) 5 MG tablet Take 5 mg by mouth daily.  5  . vitamin B-12 (CYANOCOBALAMIN) 1000 MCG tablet Take 1,000 mcg by mouth daily.    . ferrous gluconate (FERGON) 324 MG tablet Take 324 mg by mouth 2 (two) times daily with a meal. Reported on 12/18/2015    . fluticasone (FLONASE) 50 MCG/ACT nasal spray Place 1 spray into both nostrils daily. Reported on 12/18/2015  11  . furosemide (LASIX) 20 MG tablet Take 2 tablets (40 mg total) by mouth daily. (Patient not taking: Reported on 12/18/2015) 30 tablet 11  . lidocaine (LIDODERM) 5 % Place 1 patch onto the skin daily. Remove & Discard patch within 12 hours or as directed by MD. Apply over right shoulder (Patient not taking: Reported on 12/18/2015) 15 patch 0  . metoprolol tartrate (LOPRESSOR) 25 MG tablet Take 25 mg by mouth 2 (two) times daily. Reported on 12/18/2015    . pantoprazole (PROTONIX) 40 MG tablet Take 40 mg by mouth daily before breakfast.    . potassium chloride SA (K-DUR,KLOR-CON) 20 MEQ tablet Take 20 mEq by mouth daily. Reported on 12/18/2015  0   No current facility-administered medications for this visit.  PHYSICAL EXAMINATION:   BP 175/85 mmHg  Pulse 82  Temp(Src) 98.2 F (36.8 C) (Tympanic)  Resp 20  Wt 117 lb 8.1 oz (53.3 kg)  SpO2 97%   GENERAL:alert, no distress and comfortable. frail-appearing; she walks with cane She is cachectic. She is alone. She is a neck brace.  EYES: Positive for pallor. OROPHARYNX: Positive for dentures/ulceration. NECK: No thyromegaly LYMPH:  no palpable lymphadenopathy in the cervical, axillary or inguinal LUNGS: clear to auscultation with normal breathing effort; No  Wheeze or crackles  Cardio-vascular- Regular Rate & Rythm and no murmurs; one to 2+ bilateral symmetric lower extremity edema ABDOMEN:abdomen soft, non-tender and normal bowel sounds; No hepato-splenomegaly.  Musculoskeletal:no cyanosis of digits and no clubbing  NEURO: alert & oriented x 3 with fluent speech, no focal motor/sensory deficits. SKIN: no skin rash   LABORATORY DATA:  I have reviewed the data as listed    Component Value Date/Time   NA 136 12/18/2015 1047   NA 140 03/14/2014 1150   K 3.8 12/18/2015 1047   K 4.1 04/11/2014 1037   CL 101 12/18/2015 1047   CL 104 03/14/2014 1150   CO2 30 12/18/2015 1047   CO2 28 03/14/2014 1150   GLUCOSE 132* 12/18/2015 1047   GLUCOSE 86 03/14/2014 1150   BUN 32* 12/18/2015 1047   BUN 35* 03/14/2014 1150   CREATININE 1.15* 12/18/2015 1047   CREATININE 1.63* 10/17/2014 1056   CALCIUM 8.7* 12/18/2015 1047   CALCIUM 8.8 03/14/2014 1150   PROT 7.7 12/18/2015 1047   PROT 9.1* 06/28/2013 1628   ALBUMIN 3.5 12/18/2015 1047   ALBUMIN 2.6* 06/28/2013 1628   AST 23 12/18/2015 1047   AST 16 06/28/2013 1628   ALT 26 12/18/2015 1047   ALT 12 06/28/2013 1628   ALKPHOS 58 12/18/2015 1047   ALKPHOS 46 09/27/2013 0929   BILITOT 0.6 12/18/2015 1047   BILITOT 0.3 06/16/2014 1220   GFRNONAA 46* 12/18/2015 1047   GFRNONAA 33* 10/17/2014 1056   GFRNONAA 31* 06/20/2014 1356   GFRAA 54* 12/18/2015 1047   GFRAA 40* 10/17/2014 1056   GFRAA 36* 06/20/2014 1356    No results found for: SPEP, UPEP  Lab Results  Component Value Date   WBC 10.1 10/16/2015   NEUTROABS 6.1 10/16/2015   HGB 12.4 10/16/2015   HCT 38.8 10/16/2015   MCV 89.8 10/16/2015   PLT 108* 10/16/2015      Chemistry      Component Value Date/Time   NA 136 12/18/2015 1047   NA 140 03/14/2014 1150   K 3.8 12/18/2015 1047   K 4.1 04/11/2014 1037   CL 101 12/18/2015 1047   CL 104 03/14/2014 1150   CO2 30 12/18/2015 1047   CO2 28 03/14/2014 1150   BUN 32* 12/18/2015 1047    BUN 35* 03/14/2014 1150   CREATININE 1.15* 12/18/2015 1047   CREATININE 1.63* 10/17/2014 1056      Component Value Date/Time   CALCIUM 8.7* 12/18/2015 1047   CALCIUM 8.8 03/14/2014 1150   ALKPHOS 58 12/18/2015 1047   ALKPHOS 46 09/27/2013 0929   AST 23 12/18/2015 1047   AST 16 06/28/2013 1628   ALT 26 12/18/2015 1047   ALT 12 06/28/2013 1628   BILITOT 0.6 12/18/2015 1047   BILITOT 0.3 06/16/2014 1220         ASSESSMENT & PLAN:   # 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 Normal cytogenetics. Today hemoglobin is 12; white count 12/mild  monocytosis/ platelets 125 [improved]. No concern for any progression of disease at this time. I recommend continued surveillance every 4 months.  # Patient will follow-up with me in 4 months/labs.  # 15 minutes face-to-face with the patient discussing the above plan of care; more than 50% of time spent on natural history; counseling and coordination.      Kristina Sickle, MD 12/18/2015 11:56 AM

## 2015-12-26 DIAGNOSIS — I10 Essential (primary) hypertension: Secondary | ICD-10-CM | POA: Diagnosis not present

## 2015-12-26 DIAGNOSIS — M4852XS Collapsed vertebra, not elsewhere classified, cervical region, sequela of fracture: Secondary | ICD-10-CM | POA: Diagnosis not present

## 2016-01-04 DIAGNOSIS — M329 Systemic lupus erythematosus, unspecified: Secondary | ICD-10-CM | POA: Diagnosis not present

## 2016-01-04 DIAGNOSIS — N183 Chronic kidney disease, stage 3 (moderate): Secondary | ICD-10-CM | POA: Diagnosis not present

## 2016-01-04 DIAGNOSIS — I10 Essential (primary) hypertension: Secondary | ICD-10-CM | POA: Diagnosis not present

## 2016-01-04 DIAGNOSIS — Z79899 Other long term (current) drug therapy: Secondary | ICD-10-CM | POA: Diagnosis not present

## 2016-01-04 DIAGNOSIS — D693 Immune thrombocytopenic purpura: Secondary | ICD-10-CM | POA: Diagnosis not present

## 2016-01-04 DIAGNOSIS — Z1231 Encounter for screening mammogram for malignant neoplasm of breast: Secondary | ICD-10-CM | POA: Diagnosis not present

## 2016-01-16 DIAGNOSIS — R350 Frequency of micturition: Secondary | ICD-10-CM | POA: Diagnosis not present

## 2016-01-16 DIAGNOSIS — M329 Systemic lupus erythematosus, unspecified: Secondary | ICD-10-CM | POA: Diagnosis not present

## 2016-02-06 DIAGNOSIS — I429 Cardiomyopathy, unspecified: Secondary | ICD-10-CM | POA: Diagnosis not present

## 2016-02-06 DIAGNOSIS — L282 Other prurigo: Secondary | ICD-10-CM | POA: Diagnosis not present

## 2016-02-06 DIAGNOSIS — R0602 Shortness of breath: Secondary | ICD-10-CM | POA: Diagnosis not present

## 2016-02-06 DIAGNOSIS — I10 Essential (primary) hypertension: Secondary | ICD-10-CM | POA: Diagnosis not present

## 2016-02-06 DIAGNOSIS — C9110 Chronic lymphocytic leukemia of B-cell type not having achieved remission: Secondary | ICD-10-CM | POA: Diagnosis not present

## 2016-02-06 DIAGNOSIS — D649 Anemia, unspecified: Secondary | ICD-10-CM | POA: Diagnosis not present

## 2016-02-06 DIAGNOSIS — M1990 Unspecified osteoarthritis, unspecified site: Secondary | ICD-10-CM | POA: Diagnosis not present

## 2016-02-06 DIAGNOSIS — R21 Rash and other nonspecific skin eruption: Secondary | ICD-10-CM | POA: Diagnosis not present

## 2016-02-06 DIAGNOSIS — M1A00X0 Idiopathic chronic gout, unspecified site, without tophus (tophi): Secondary | ICD-10-CM | POA: Diagnosis not present

## 2016-02-06 DIAGNOSIS — N183 Chronic kidney disease, stage 3 (moderate): Secondary | ICD-10-CM | POA: Diagnosis not present

## 2016-04-08 DIAGNOSIS — N183 Chronic kidney disease, stage 3 (moderate): Secondary | ICD-10-CM | POA: Diagnosis not present

## 2016-04-08 DIAGNOSIS — Z0184 Encounter for antibody response examination: Secondary | ICD-10-CM | POA: Diagnosis not present

## 2016-04-08 DIAGNOSIS — M4852XS Collapsed vertebra, not elsewhere classified, cervical region, sequela of fracture: Secondary | ICD-10-CM | POA: Diagnosis not present

## 2016-04-08 DIAGNOSIS — I10 Essential (primary) hypertension: Secondary | ICD-10-CM | POA: Diagnosis not present

## 2016-04-29 ENCOUNTER — Inpatient Hospital Stay: Payer: Medicare Other | Attending: Internal Medicine

## 2016-04-29 ENCOUNTER — Inpatient Hospital Stay (HOSPITAL_BASED_OUTPATIENT_CLINIC_OR_DEPARTMENT_OTHER): Payer: Medicare Other | Admitting: Internal Medicine

## 2016-04-29 VITALS — BP 177/96 | HR 65 | Temp 97.9°F | Resp 18 | Wt 134.0 lb

## 2016-04-29 DIAGNOSIS — C9310 Chronic myelomonocytic leukemia not having achieved remission: Secondary | ICD-10-CM | POA: Diagnosis not present

## 2016-04-29 DIAGNOSIS — C9312 Chronic myelomonocytic leukemia, in relapse: Secondary | ICD-10-CM

## 2016-04-29 DIAGNOSIS — Z79899 Other long term (current) drug therapy: Secondary | ICD-10-CM

## 2016-04-29 DIAGNOSIS — R0602 Shortness of breath: Secondary | ICD-10-CM | POA: Insufficient documentation

## 2016-04-29 DIAGNOSIS — N189 Chronic kidney disease, unspecified: Secondary | ICD-10-CM | POA: Diagnosis not present

## 2016-04-29 DIAGNOSIS — R5383 Other fatigue: Secondary | ICD-10-CM

## 2016-04-29 LAB — CBC WITH DIFFERENTIAL/PLATELET
BASOS PCT: 1 %
Basophils Absolute: 0.1 10*3/uL (ref 0–0.1)
Eosinophils Absolute: 0.1 10*3/uL (ref 0–0.7)
Eosinophils Relative: 1 %
HEMATOCRIT: 35.8 % (ref 35.0–47.0)
Hemoglobin: 11.7 g/dL — ABNORMAL LOW (ref 12.0–16.0)
LYMPHS ABS: 1.2 10*3/uL (ref 1.0–3.6)
Lymphocytes Relative: 11 %
MCH: 29.3 pg (ref 26.0–34.0)
MCHC: 32.8 g/dL (ref 32.0–36.0)
MCV: 89.4 fL (ref 80.0–100.0)
MONO ABS: 3.8 10*3/uL — AB (ref 0.2–0.9)
Monocytes Relative: 36 %
NEUTROS ABS: 5.4 10*3/uL (ref 1.4–6.5)
Neutrophils Relative %: 51 %
PLATELETS: 109 10*3/uL — AB (ref 150–440)
RBC: 4 MIL/uL (ref 3.80–5.20)
RDW: 14.3 % (ref 11.5–14.5)
WBC: 10.6 10*3/uL (ref 3.6–11.0)

## 2016-04-29 LAB — COMPREHENSIVE METABOLIC PANEL
ALBUMIN: 3.5 g/dL (ref 3.5–5.0)
ALT: 26 U/L (ref 14–54)
ANION GAP: 6 (ref 5–15)
AST: 17 U/L (ref 15–41)
Alkaline Phosphatase: 53 U/L (ref 38–126)
BILIRUBIN TOTAL: 0.7 mg/dL (ref 0.3–1.2)
BUN: 30 mg/dL — ABNORMAL HIGH (ref 6–20)
CHLORIDE: 103 mmol/L (ref 101–111)
CO2: 30 mmol/L (ref 22–32)
Calcium: 9.1 mg/dL (ref 8.9–10.3)
Creatinine, Ser: 1.03 mg/dL — ABNORMAL HIGH (ref 0.44–1.00)
GFR calc Af Amer: >60 mL/min (ref >60)
GFR calc non Af Amer: 53 mL/min — ABNORMAL LOW (ref >60)
GLUCOSE: 88 mg/dL (ref 65–99)
POTASSIUM: 3.8 mmol/L (ref 3.5–5.1)
SODIUM: 139 mmol/L (ref 135–145)
TOTAL PROTEIN: 7.9 g/dL (ref 6.5–8.1)

## 2016-04-29 NOTE — Assessment & Plan Note (Addendum)
#   CMML [chronic myelomonocytic leukemia]-1Normal cytogenetics. Today hemoglobin is 11.7; white count 7/mild monocytosis/ platelets 109 [stable. On surveillance.   No concern for any progression of disease at this time. I recommend continued surveillance every 33months. Patient will follow-up with me in 6 months/labs.  # She was asked to call us sooner if noted any abnormal symptoms of bleeding. She agrees.

## 2016-04-29 NOTE — Progress Notes (Signed)
[place] OFFICE PROGRESS NOTE  Patient Care Team: [name], MD as PCP - General (Family Medicine)  HPI  SUMMARY of HEMATOLOGIC HISTORY:  Oncology History   [id number]-? CHRONIC MYELOMONOCYTIC LEUKEMIA-1 [NOV 2016- BMBx; [name]; atypical monocytes with multilinege dysplasia; <5% blasts; [name];  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 Patient continues to wear the neck brace; she is doing fairly well. No surgery planned at this time.  She continues to have chronic shortness of breath/fatigue not new. Patient is currently at home living with her son. She has been exercising. She walks with a cane. She denies any bleeding gums or easy bruising.  REVIEW OF SYSTEMS:  A complete 10 point review of system is done which is negative except mentioned above in history of present illness.   I have reviewed the past medical history, past surgical history, social history and family history with the patient and they are unchanged from previous note unless stated above.  ALLERGIES:  is allergic to sulfa antibiotics; sulfamethoxazole-trimethoprim; and aspirin.  MEDICATIONS:  Current Outpatient Prescriptions  Medication Sig Dispense Refill  . acetaminophen (TYLENOL) 650 MG CR tablet Take 1 tablet (650 mg total) by mouth every 6 (six) hours. 30 tablet 0  . amLODipine (NORVASC) 10 MG tablet Take by mouth.    . Calcium Carbonate-Vitamin D (CALCIUM-VITAMIN D) 500-200 MG-UNIT tablet Take 1 tablet by mouth daily.    . Cholecalciferol (VITAMIN D3) 1000 units CAPS  Take 1 capsule by mouth 1 day or 1 dose.    . docusate sodium (COLACE) 100 MG capsule Take 100 mg by mouth 2 (two) times daily.    . febuxostat (ULORIC) 40 MG tablet Take 40 mg by mouth daily.     . Fish Oil-Cholecalciferol (FISH OIL + D3 PO) Take by mouth daily.    . fluticasone (FLONASE) 50 MCG/ACT nasal spray Place 1 spray into both nostrils daily. Reported on [id number]  11  . folic acid (FOLVITE) 1 MG tablet Take 1 tablet (1 mg total) by mouth daily. 30 tablet 0  . hydroxychloroquine (PLAQUENIL) 200 MG tablet Take 200 mg by mouth daily.    Marland Kitchen losartan (COZAAR) 25 MG tablet Take by mouth.    . metoprolol tartrate (LOPRESSOR) 25 MG tablet Take 25 mg by mouth 2 (two) times daily. Reported on 12/18/2015    . Multiple Vitamins-Minerals (ICAPS) CAPS Take 1 capsule by mouth 1 day or 1 dose.    . Multiple Vitamins-Minerals (MULTIVITAMIN WITH MINERALS) tablet Take by mouth.    . oxybutynin (DITROPAN) 5 MG tablet Take 5 mg by mouth daily.  5  . pantoprazole (PROTONIX) 40 MG tablet Take 40 mg by mouth daily before breakfast.    . Polyethyl Glycol-Propyl Glycol (SYSTANE) 0.4-0.3 % SOLN Apply to eye.     No current facility-administered medications for this visit.    PHYSICAL EXAMINATION:   BP 177/96 mmHg  Pulse 65  Temp(Src) 97.9 F (36.6 C) (Tympanic)  Resp 18  Wt 134 lb 0.6 oz (60.8 kg)   GENERAL:alert, no distress and comfortable. frail-appearing; she walks with cane She is cachectic. She is alone.  She is a neck brace.  EYES: Positive for pallor. OROPHARYNX: Positive for dentures/ulceration. NECK: No thyromegaly LYMPH:  no palpable lymphadenopathy in the cervical, axillary or inguinal LUNGS: clear to auscultation with normal breathing effort; No Wheeze or crackles  Cardio-vascular- Regular Rate & Rythm and no murmurs; NO lower extremity edema ABDOMEN:abdomen soft, non-tender and normal bowel sounds; No hepato-splenomegaly.  Musculoskeletal:no cyanosis of digits and no clubbing  NEURO:  alert & oriented x 3 with fluent speech, no focal motor/sensory deficits. SKIN: no skin rash   LABORATORY DATA:  I have reviewed the data as listed    Component Value Date/Time   NA 139 04/29/2016 1059   NA 140 03/14/2014 1150   K 3.8 04/29/2016 1059   K 4.1 04/11/2014 1037   CL 103 04/29/2016 1059   CL 104 03/14/2014 1150   CO2 30 04/29/2016 1059   CO2 28 03/14/2014 1150   GLUCOSE 88 04/29/2016 1059   GLUCOSE 86 03/14/2014 1150   BUN 30* 04/29/2016 1059   BUN 35* 03/14/2014 1150   CREATININE 1.03* 04/29/2016 1059   CREATININE 1.63* 10/17/2014 1056   CALCIUM 9.1 04/29/2016 1059   CALCIUM 8.8 03/14/2014 1150   PROT 7.9 04/29/2016 1059   PROT 9.1* 06/28/2013 1628   ALBUMIN 3.5 04/29/2016 1059   ALBUMIN 2.6* 06/28/2013 1628   AST 17 04/29/2016 1059   AST 16 06/28/2013 1628   ALT 26 04/29/2016 1059   ALT 12 06/28/2013 1628   ALKPHOS 53 04/29/2016 1059   ALKPHOS 46 09/27/2013 0929   BILITOT 0.7 04/29/2016 1059   BILITOT 0.3 06/16/2014 1220   GFRNONAA 53* 04/29/2016 1059   GFRNONAA 33* 10/17/2014 1056   GFRNONAA 31* 06/20/2014 1356   GFRAA >60 04/29/2016 1059   GFRAA 40* 10/17/2014 1056   GFRAA 36* 06/20/2014 1356    No results found for: SPEP, UPEP  Lab Results  Component Value Date   WBC 10.6 04/29/2016   NEUTROABS 5.4 04/29/2016   HGB 11.7* 04/29/2016   HCT 35.8 04/29/2016   MCV 89.4 04/29/2016   PLT 109* 04/29/2016      Chemistry      Component Value Date/Time   NA 139 04/29/2016 1059   NA 140 03/14/2014 1150   K 3.8 04/29/2016 1059   K 4.1 04/11/2014 1037   CL 103 04/29/2016 1059   CL 104 03/14/2014 1150   CO2 30 04/29/2016 1059   CO2 28 03/14/2014 1150   BUN 30* 04/29/2016 1059   BUN 35* 03/14/2014 1150   CREATININE 1.03* 04/29/2016 1059   CREATININE 1.63* 10/17/2014 1056      Component Value Date/Time   CALCIUM 9.1 04/29/2016 1059   CALCIUM 8.8 03/14/2014 1150   ALKPHOS 53 04/29/2016 1059   ALKPHOS 46 09/27/2013 0929   AST 17 04/29/2016  1059   AST 16 06/28/2013 1628   ALT 26 04/29/2016 1059   ALT 12 06/28/2013 1628   BILITOT 0.7 04/29/2016 1059   BILITOT 0.3 06/16/2014 1220     ASSESSMENT & PLAN:   Chronic myelomonocytic leukemia not having achieved remission ([place]) # CMML [chronic myelomonocytic leukemia]-1Normal cytogenetics. Today hemoglobin is 11.7; white count 7/mild monocytosis/ platelets 109 [stable. On surveillance.   No concern for any progression of disease at this time. I recommend continued surveillance every 104months. Patient will follow-up with me in 6 months/labs.  # She was asked to call us sooner if noted any abnormal symptoms of bleeding. She agrees.     Cammie Sickle, MD  04/29/2016 11:46 AM

## 2016-05-29 ENCOUNTER — Other Ambulatory Visit: Payer: Self-pay | Admitting: Family Medicine

## 2016-05-29 DIAGNOSIS — N281 Cyst of kidney, acquired: Secondary | ICD-10-CM

## 2016-06-03 ENCOUNTER — Other Ambulatory Visit: Payer: Self-pay | Admitting: Family Medicine

## 2016-06-03 ENCOUNTER — Ambulatory Visit
Admission: RE | Admit: 2016-06-03 | Discharge: 2016-06-03 | Disposition: A | Discharge status: Home / Self Care | Payer: Medicare Other | Source: Ambulatory Visit | Attending: Family Medicine | Admitting: Family Medicine

## 2016-06-03 DIAGNOSIS — R93422 Abnormal radiologic findings on diagnostic imaging of left kidney: Secondary | ICD-10-CM | POA: Diagnosis not present

## 2016-06-03 DIAGNOSIS — R93421 Abnormal radiologic findings on diagnostic imaging of right kidney: Secondary | ICD-10-CM | POA: Diagnosis not present

## 2016-06-03 DIAGNOSIS — Z1231 Encounter for screening mammogram for malignant neoplasm of breast: Secondary | ICD-10-CM

## 2016-06-03 DIAGNOSIS — N281 Cyst of kidney, acquired: Secondary | ICD-10-CM | POA: Diagnosis not present

## 2016-10-07 IMAGING — CR DG CHEST 1V PORT
1 series · 2 of 2 positions shown · non-contrast
Comparison: 06/28/2013.

CLINICAL DATA: 74-year-old female with fever, hypoxia, tachycardia.
Initial encounter.

EXAM:
PORTABLE CHEST - 1 VIEW

[Series 1: ap · 0.17mm/px · 2 of 2 slices shown]
[im 1/2]
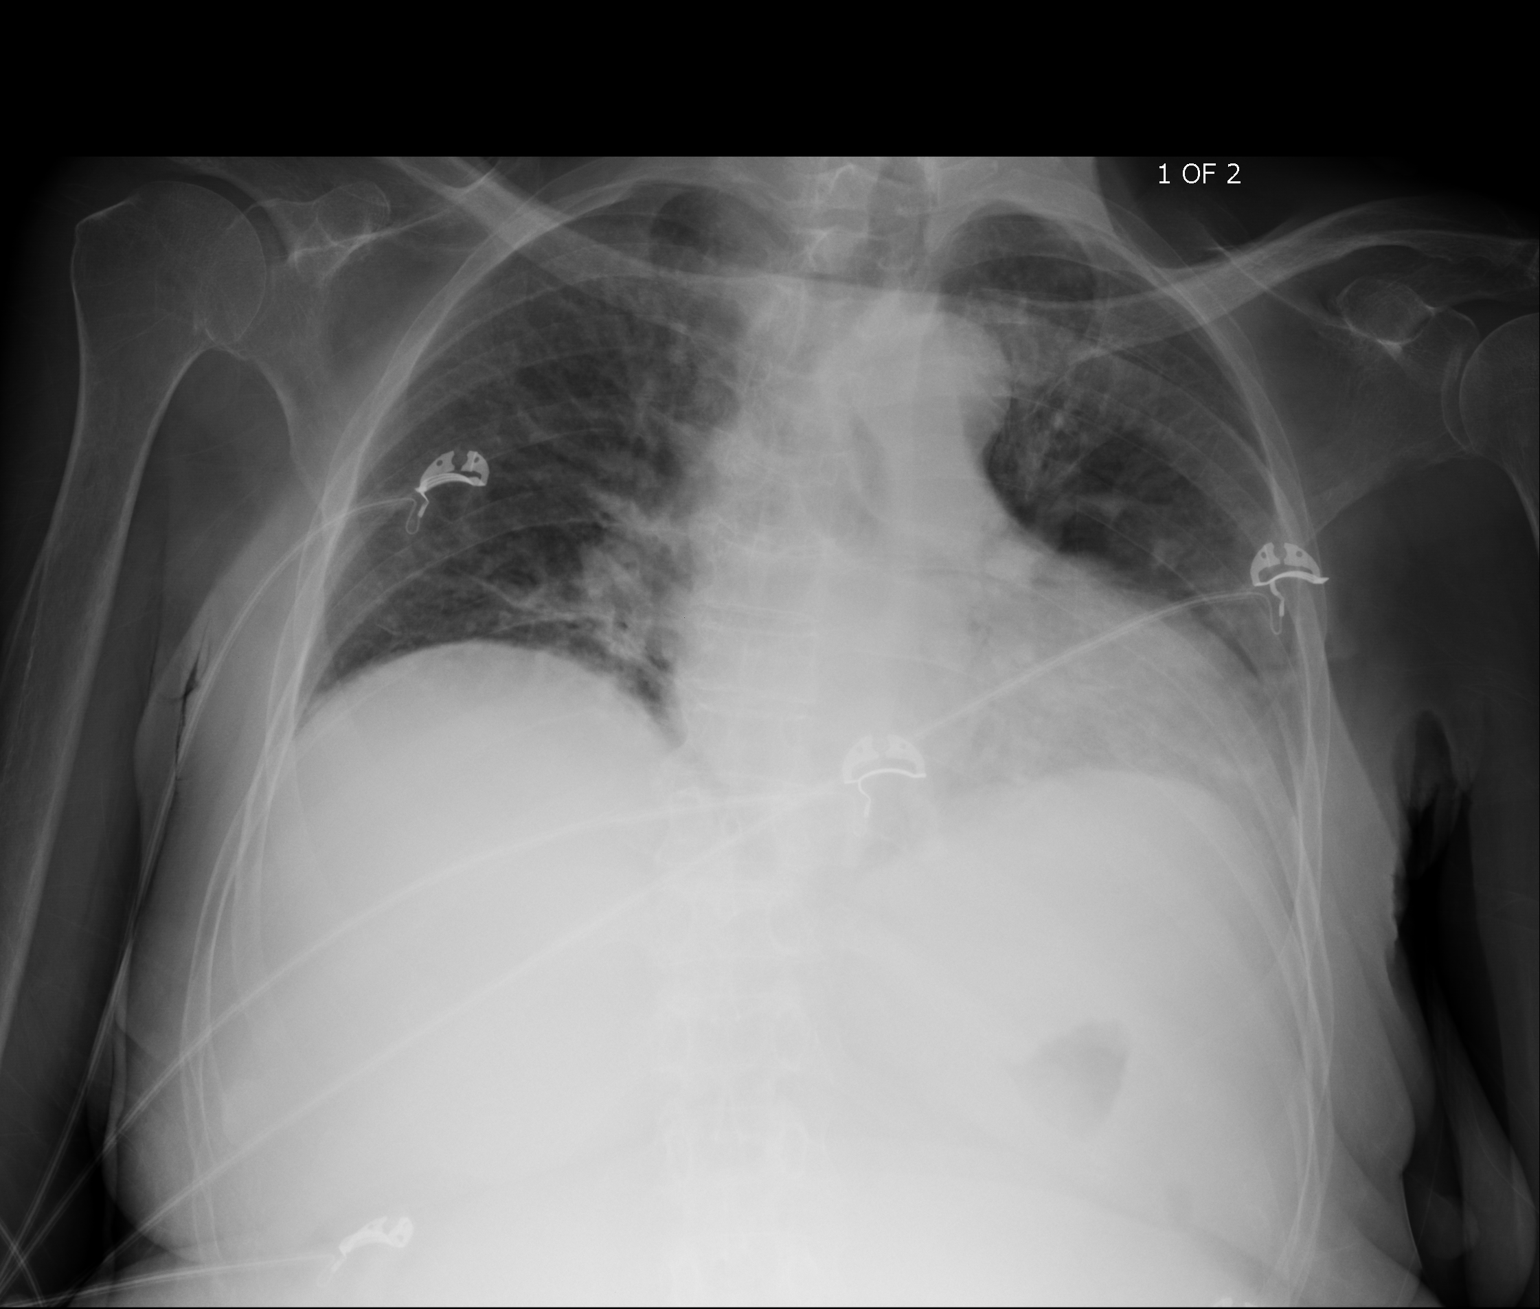
[im 2/2]
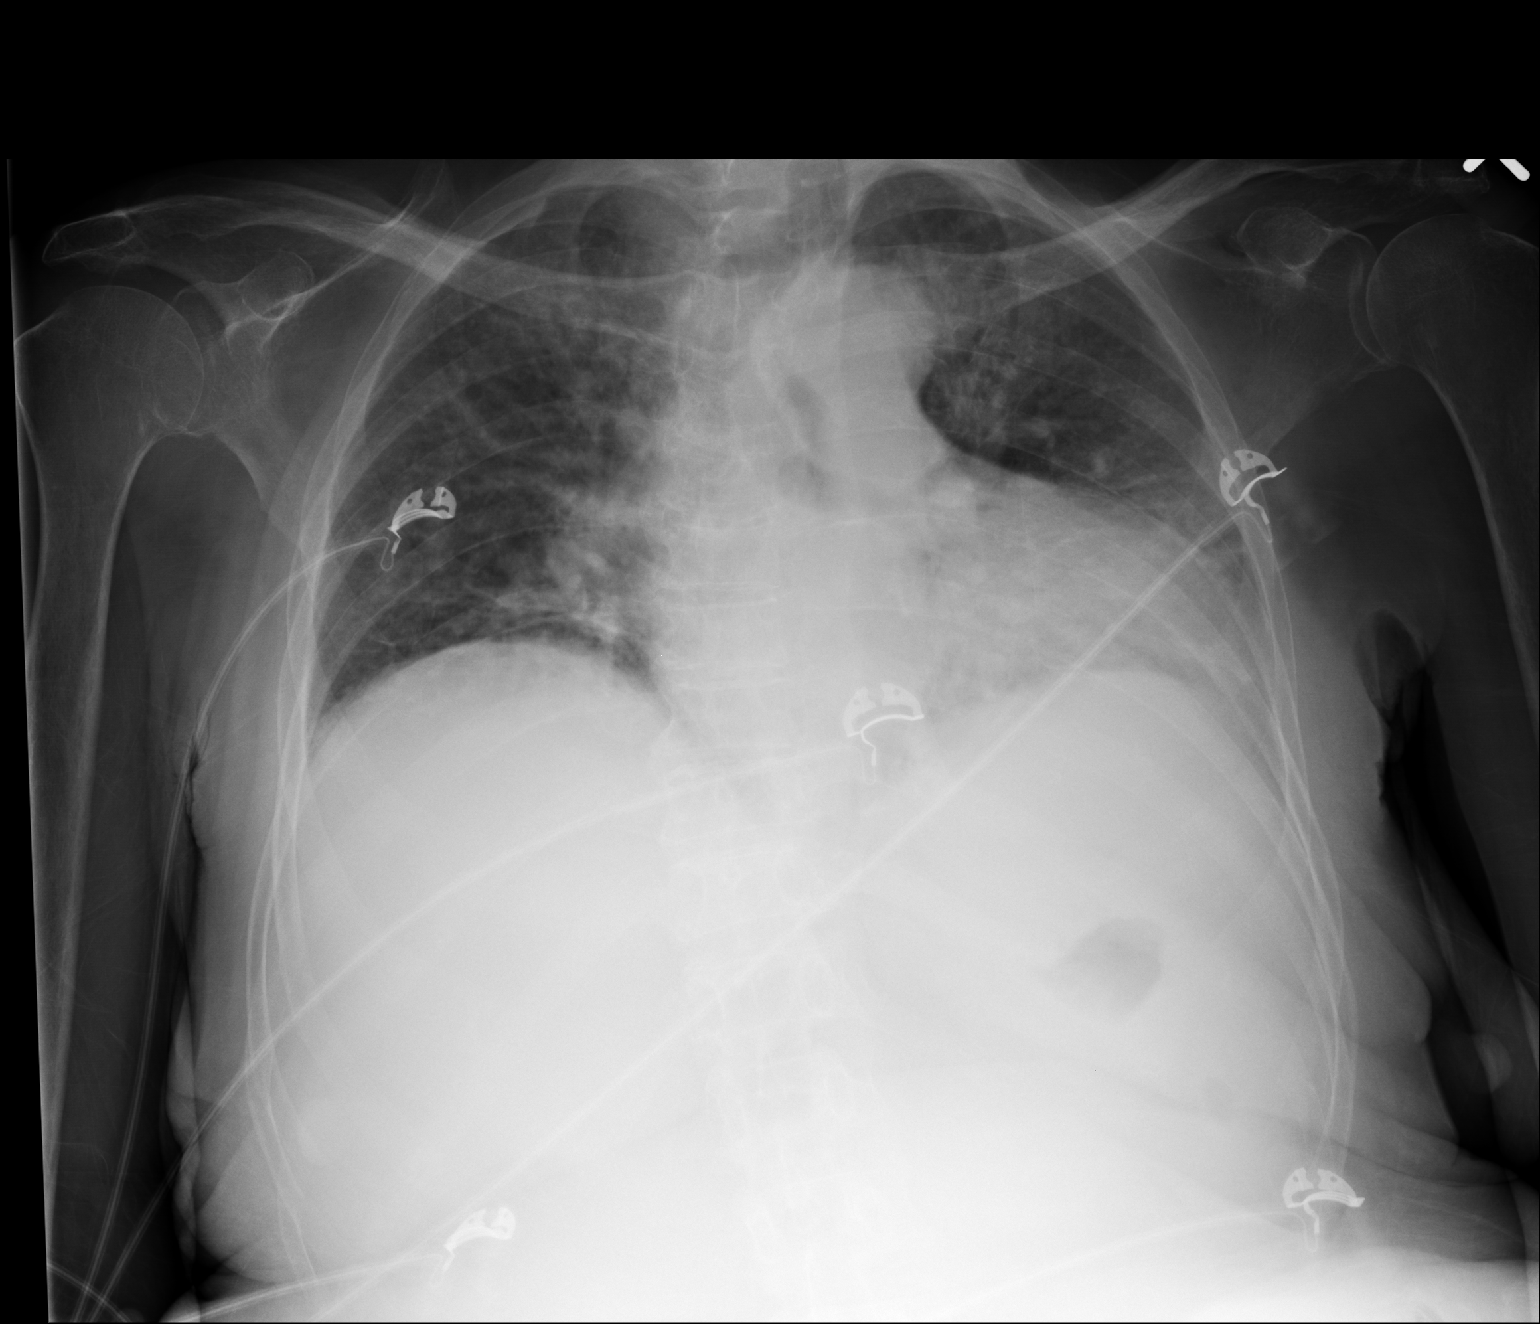

[2 of 2 positions shown; findings below may reference images not displayed]

FINDINGS: Portable AP upright view at 8778 hours. Lower lung volumes. Crowding
of lung markings. Stable cardiomegaly and mediastinal contours. No
pneumothorax, pulmonary edema, pleural effusion or consolidation.
IMPRESSION: Low lung volumes with atelectasis.  Stable cardiomegaly.

## 2016-10-07 IMAGING — CR DG WRIST COMPLETE 3+V*R*
3 series · 3 of 3 positions shown · non-contrast
Comparison: None.

CLINICAL DATA: Posterior wrist swelling. CML

EXAM:
RIGHT WRIST - COMPLETE 3+ VIEW

[PA (1 of 2)]
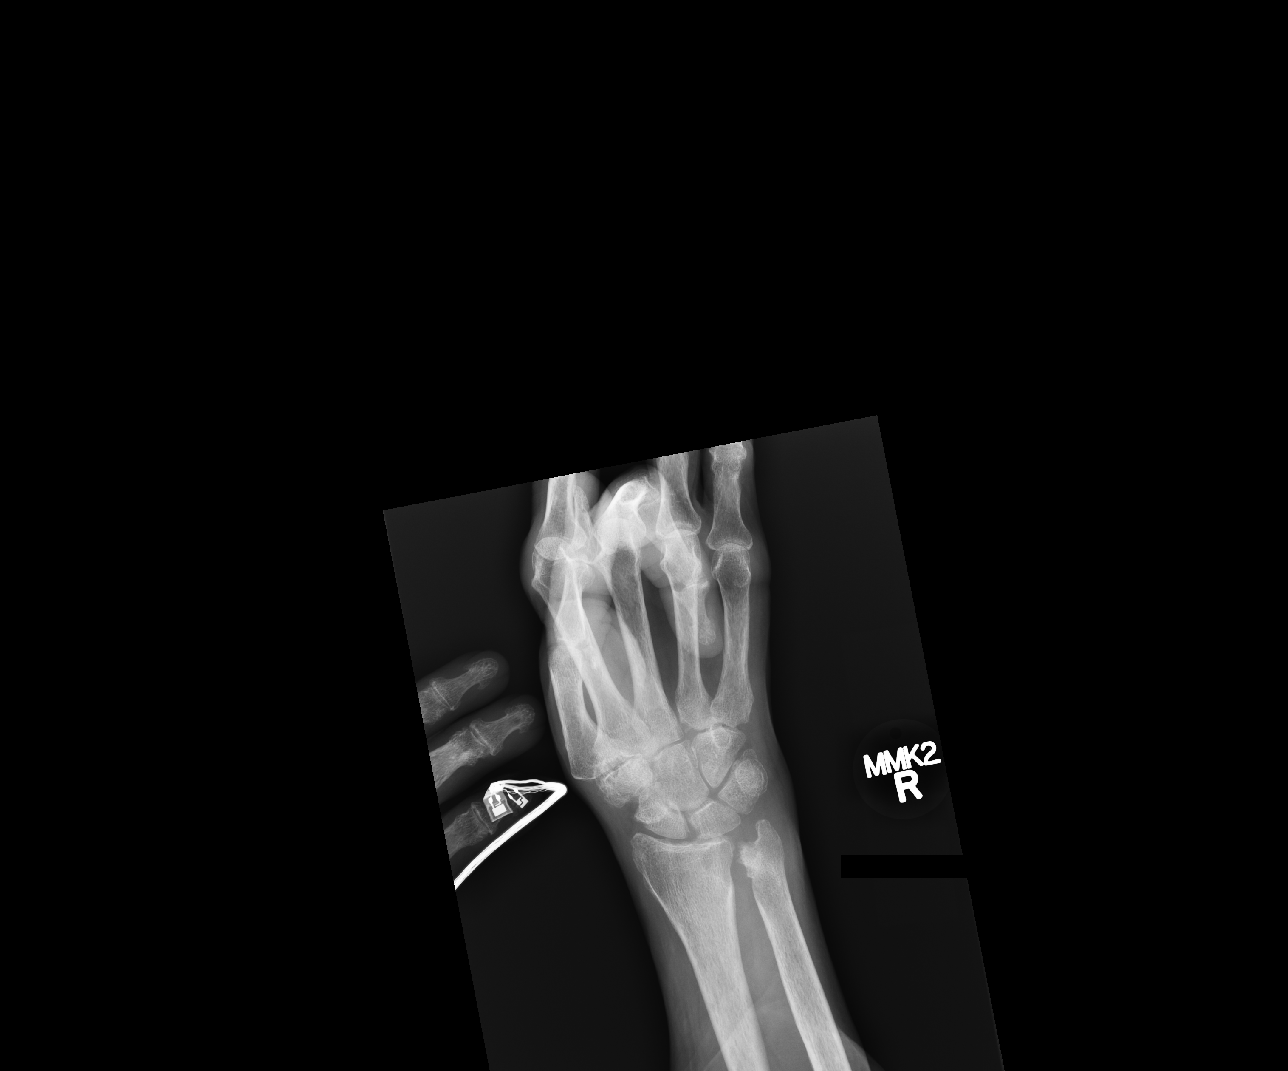

[PA (2 of 2)]
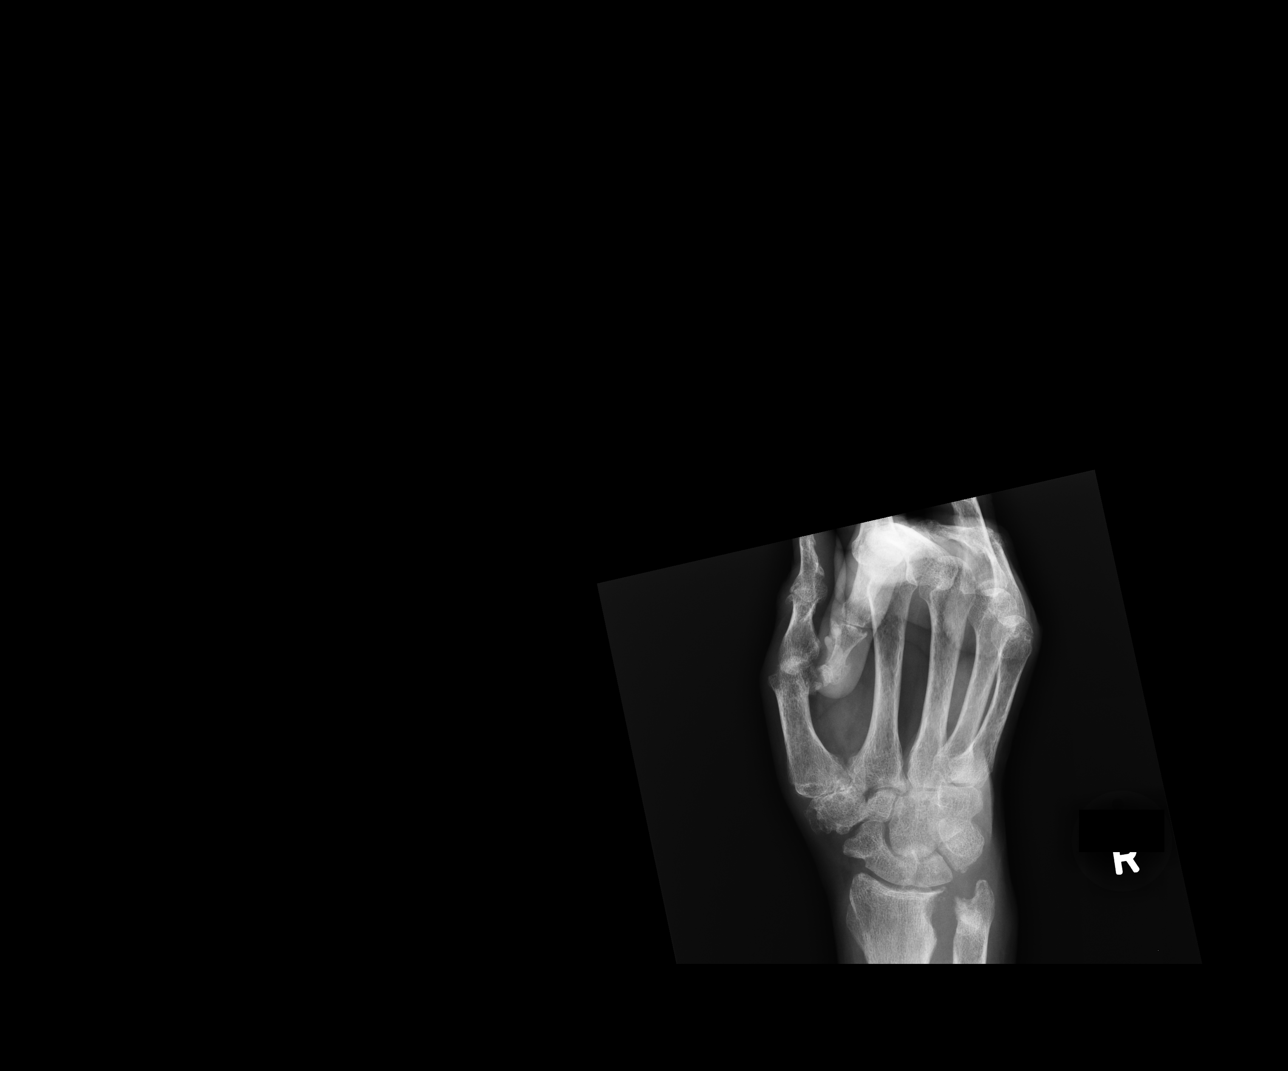

[lateral]
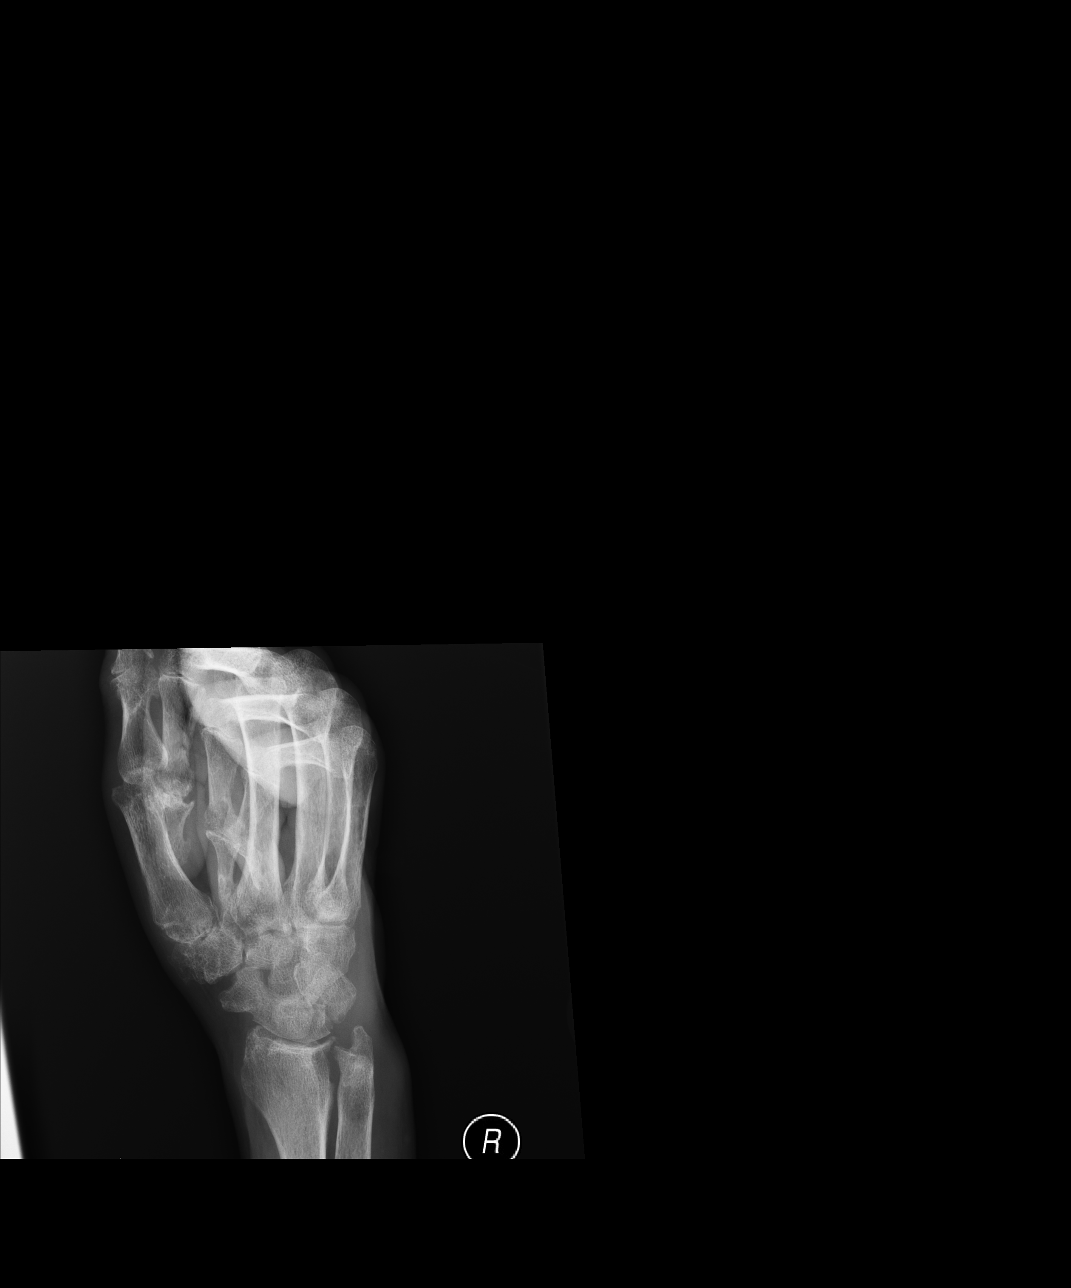

[3 of 3 positions shown; findings below may reference images not displayed]

FINDINGS: There is a large erosion with overhanging edges involving the
lateral distal radius with irregularity of the medial ulnar head. An
ill-defined lucency in the distal ulnar metadiaphysis is likely
related to the same process. There is soft tissue fullness in this
region, but no calcified soft tissue mass.

Advanced first CMC joint narrowing with subchondral cystic change
and spurring.

Osteopenia.

No acute fracture dislocation.

Limited study due to patient condition, with no true lateral
imaging.
IMPRESSION: 1. Erosions centered at the distal radial ulnar joint, likely
related to patient's history of gout.
2. Limited portable study.

## 2016-10-08 IMAGING — CR DG SHOULDER 1V*L*
3 series · 3 of 3 positions shown · non-contrast
Comparison: None.

CLINICAL DATA: Bilateral shoulder pain

EXAM:
LEFT SHOULDER - 1 VIEW

[AP (1 of 3)]
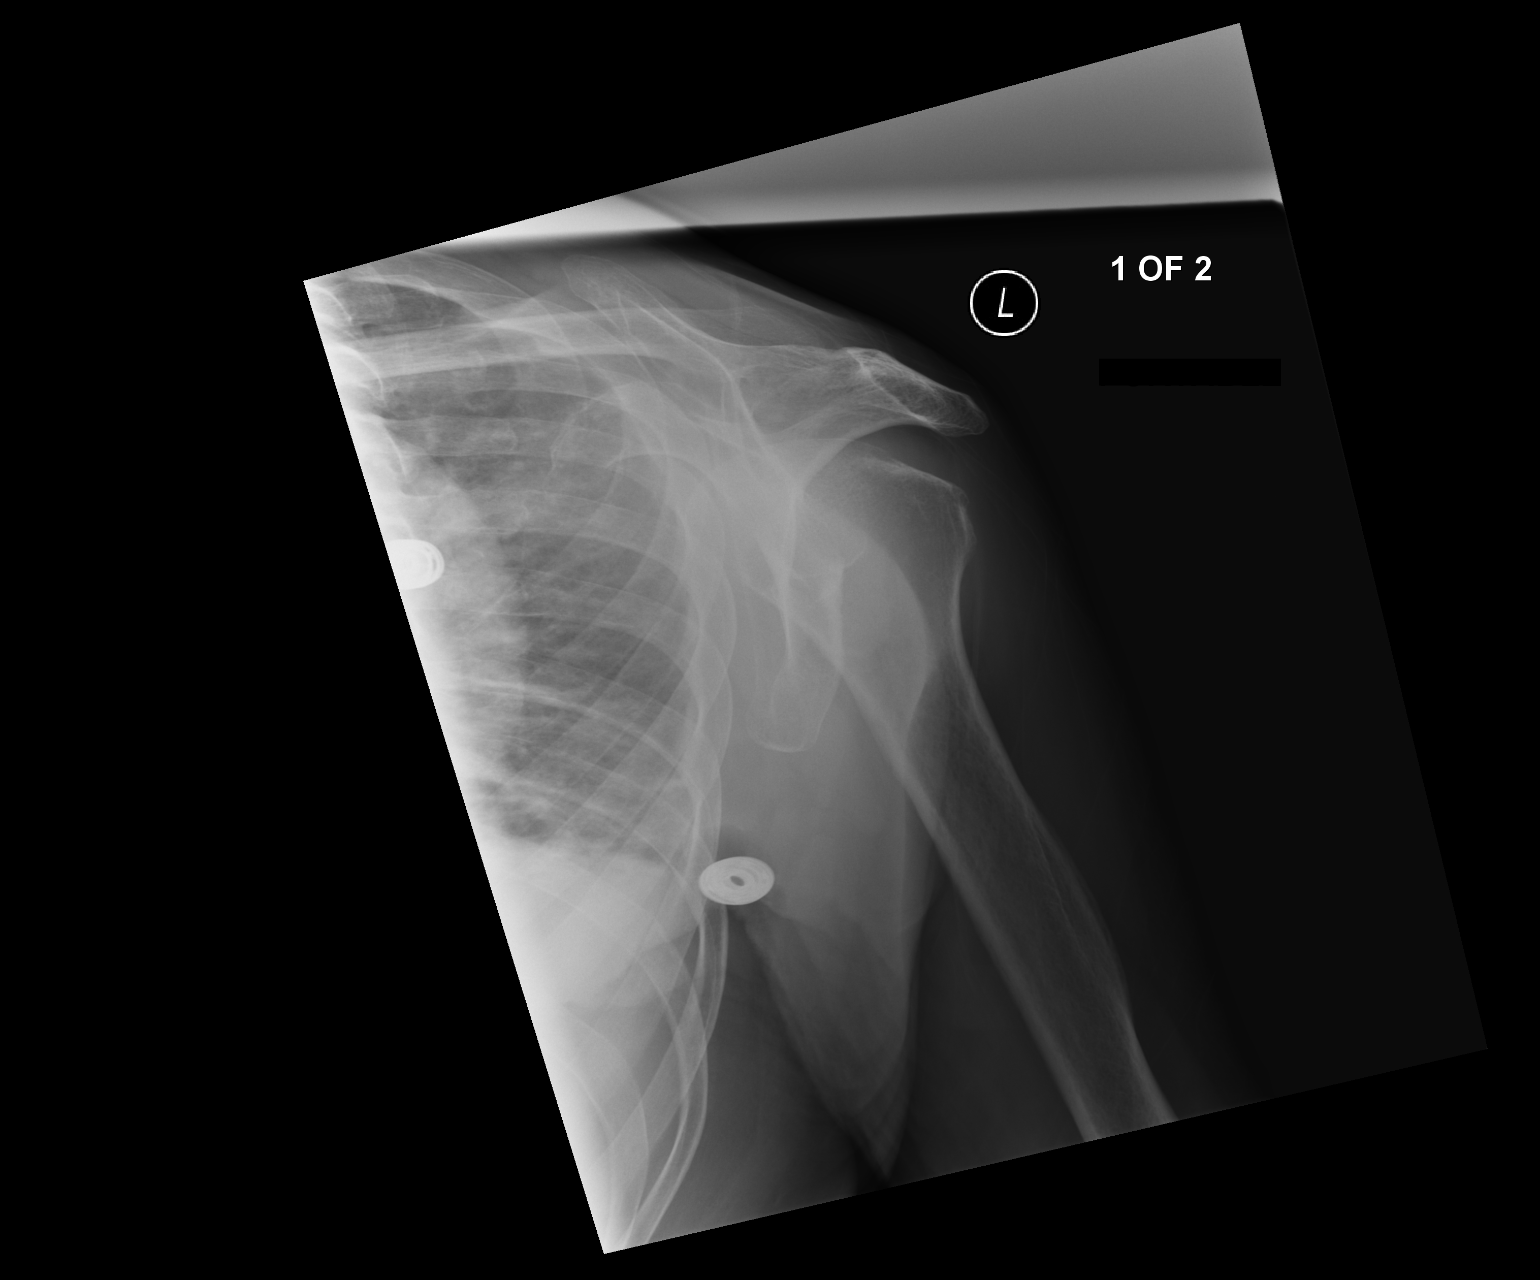

[AP (2 of 3)]
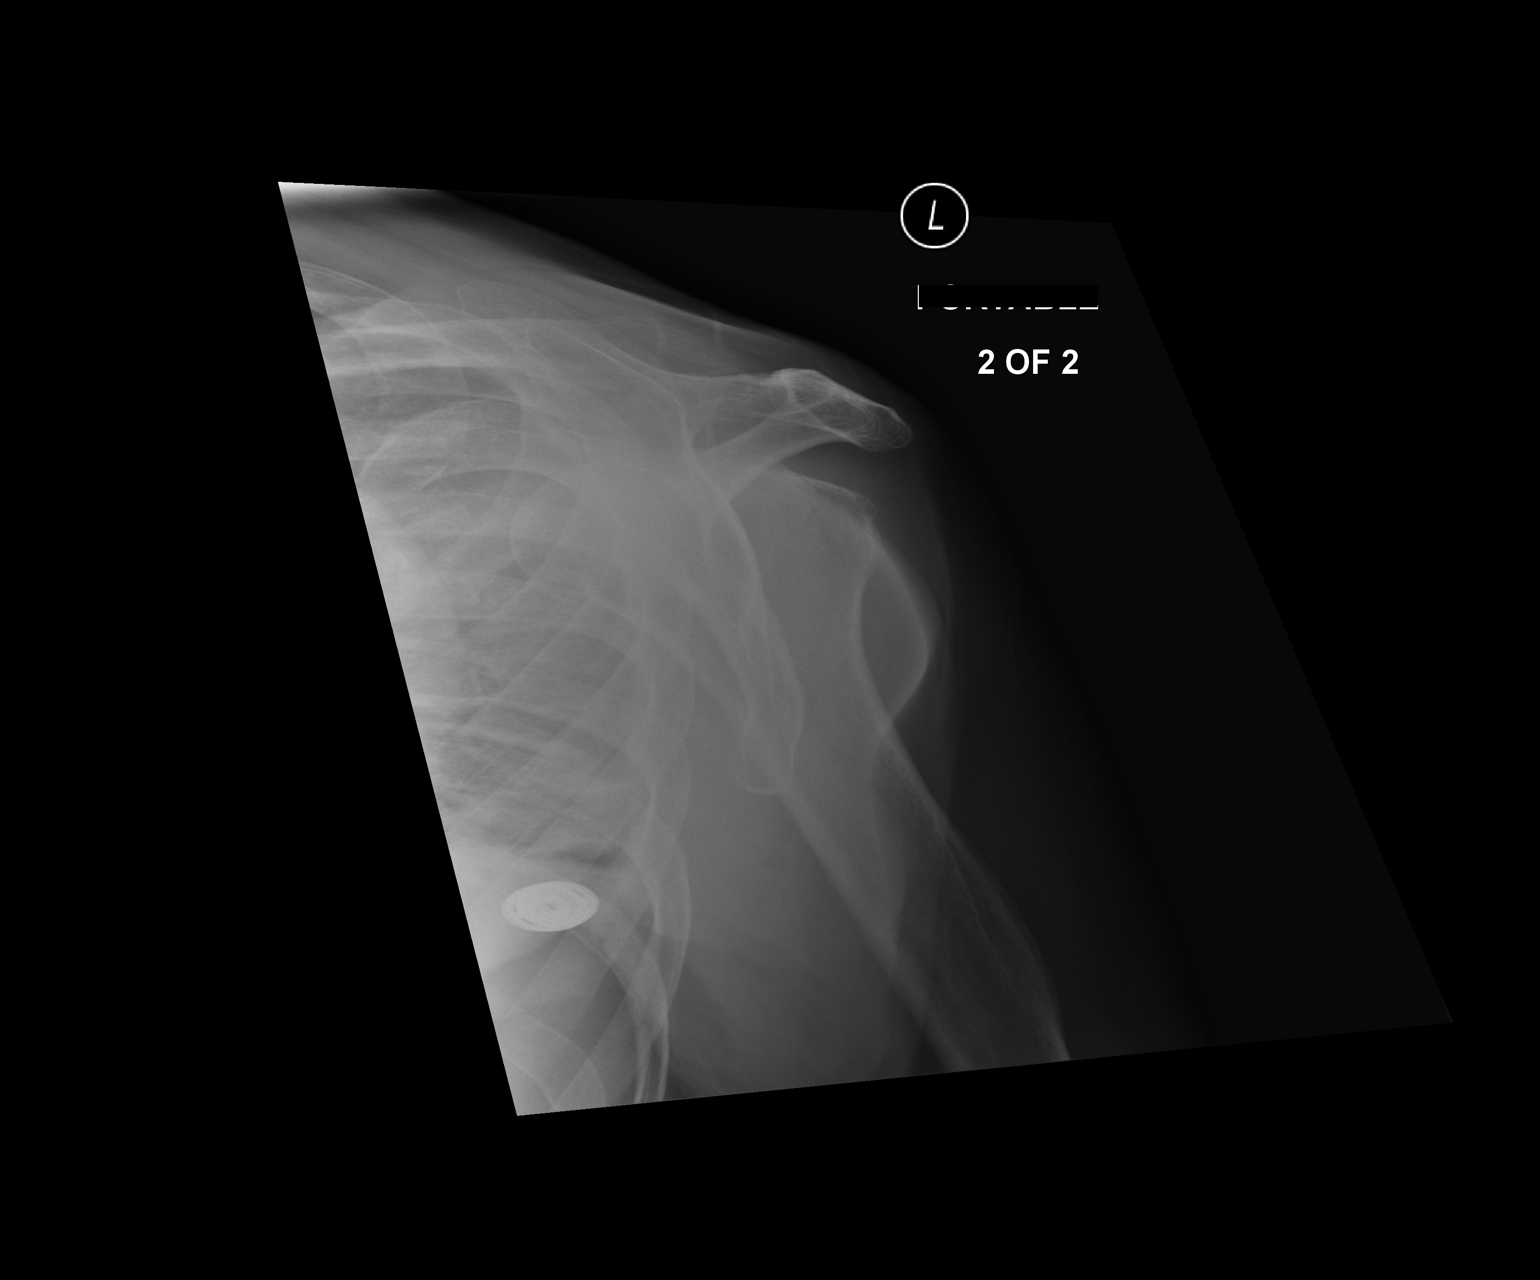

[AP (3 of 3)]
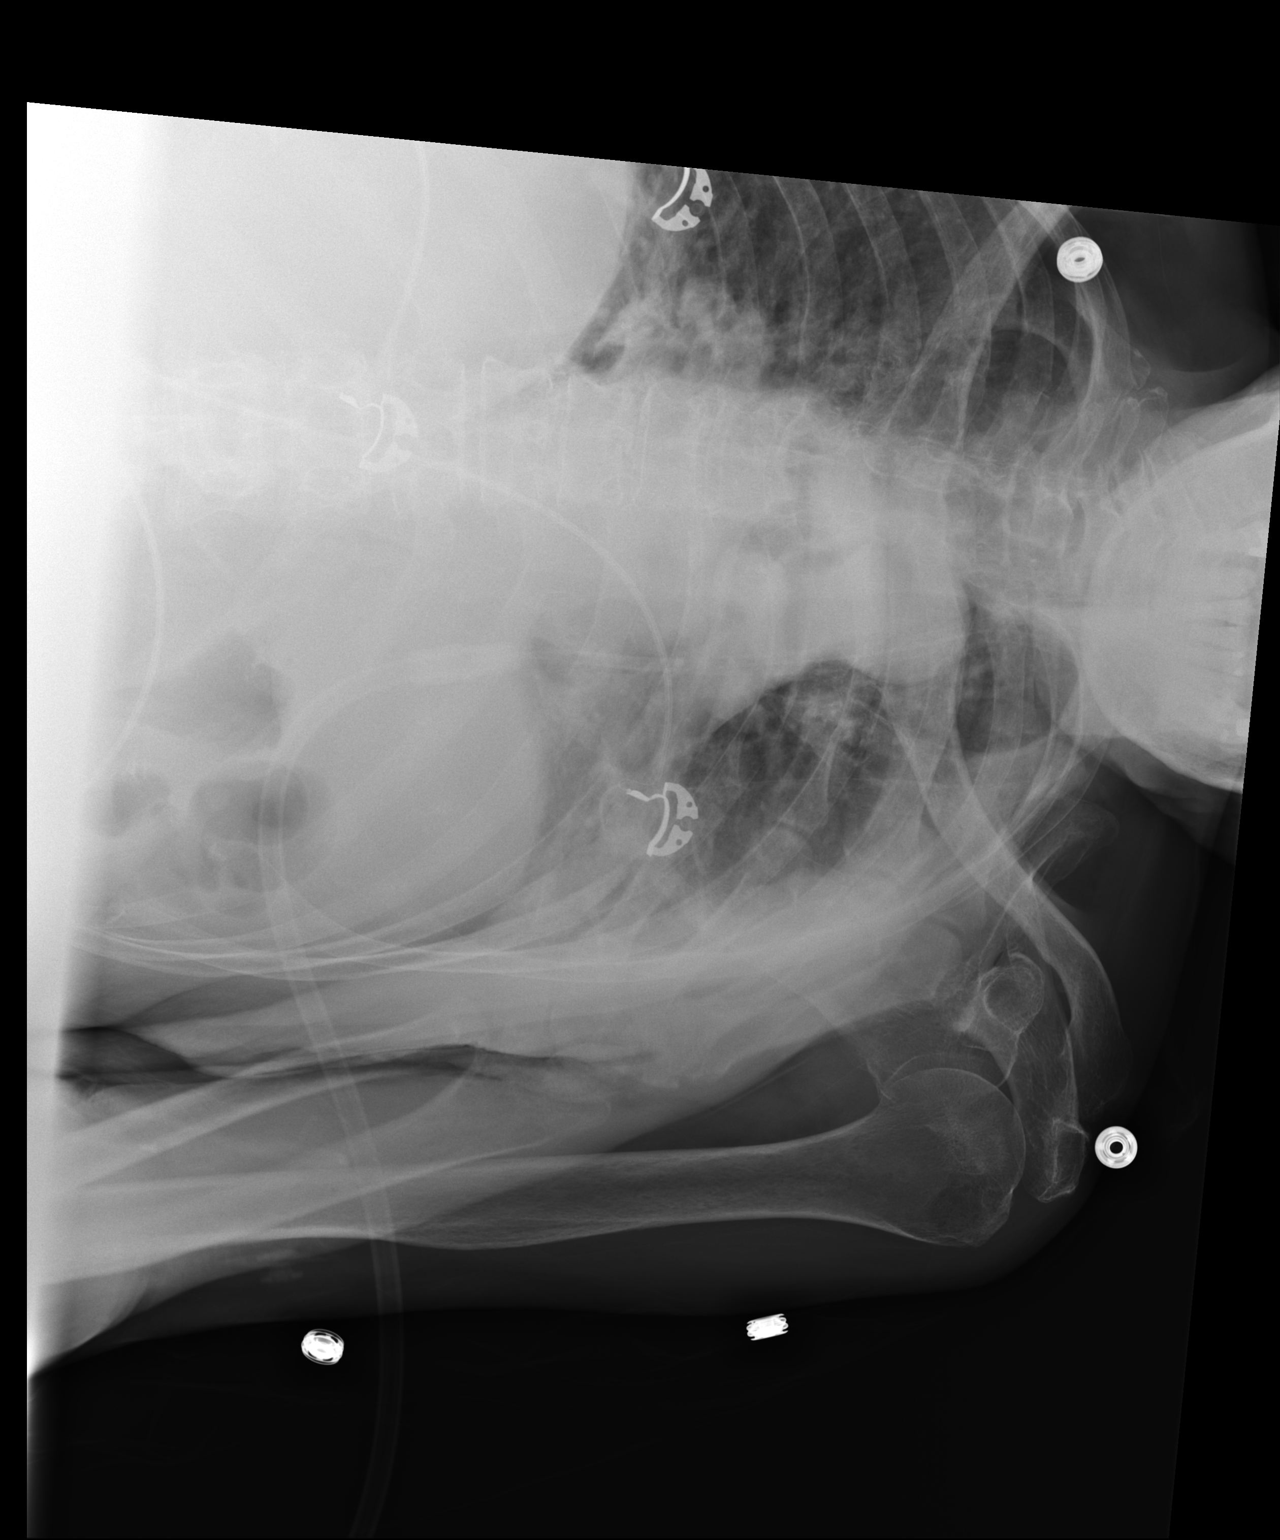

[3 of 3 positions shown; findings below may reference images not displayed]

FINDINGS: No fracture or dislocation is seen.

Mild degenerative changes of the acromioclavicular joint.

The visualized soft tissues are unremarkable.

Visualized left lung is essentially clear.
IMPRESSION: No fracture or dislocation is seen.

## 2016-10-28 ENCOUNTER — Inpatient Hospital Stay (HOSPITAL_BASED_OUTPATIENT_CLINIC_OR_DEPARTMENT_OTHER): Payer: Medicare Other | Admitting: Internal Medicine

## 2016-10-28 ENCOUNTER — Inpatient Hospital Stay: Payer: Medicare Other | Attending: Internal Medicine

## 2016-10-28 VITALS — BP 175/88 | HR 66 | Temp 97.5°F | Resp 18 | Ht 65.0 in | Wt 142.2 lb

## 2016-10-28 DIAGNOSIS — R5381 Other malaise: Secondary | ICD-10-CM | POA: Insufficient documentation

## 2016-10-28 DIAGNOSIS — Z79899 Other long term (current) drug therapy: Secondary | ICD-10-CM | POA: Insufficient documentation

## 2016-10-28 DIAGNOSIS — C9310 Chronic myelomonocytic leukemia not having achieved remission: Secondary | ICD-10-CM

## 2016-10-28 DIAGNOSIS — I129 Hypertensive chronic kidney disease with stage 1 through stage 4 chronic kidney disease, or unspecified chronic kidney disease: Secondary | ICD-10-CM | POA: Diagnosis not present

## 2016-10-28 DIAGNOSIS — N183 Chronic kidney disease, stage 3 (moderate): Secondary | ICD-10-CM

## 2016-10-28 LAB — COMPREHENSIVE METABOLIC PANEL
ALK PHOS: 55 U/L (ref 38–126)
ALT: 15 U/L (ref 14–54)
ANION GAP: 7 (ref 5–15)
AST: 16 U/L (ref 15–41)
Albumin: 3.6 g/dL (ref 3.5–5.0)
BILIRUBIN TOTAL: 0.6 mg/dL (ref 0.3–1.2)
BUN: 31 mg/dL — ABNORMAL HIGH (ref 6–20)
CO2: 28 mmol/L (ref 22–32)
Calcium: 8.6 mg/dL — ABNORMAL LOW (ref 8.9–10.3)
Chloride: 104 mmol/L (ref 101–111)
Creatinine, Ser: 1.28 mg/dL — ABNORMAL HIGH (ref 0.44–1.00)
GFR, EST AFRICAN AMERICAN: 47 mL/min — AB (ref >60)
GFR, EST NON AFRICAN AMERICAN: 40 mL/min — AB (ref >60)
Glucose, Bld: 87 mg/dL (ref 65–99)
Potassium: 4.1 mmol/L (ref 3.5–5.1)
Sodium: 139 mmol/L (ref 135–145)
TOTAL PROTEIN: 7.7 g/dL (ref 6.5–8.1)

## 2016-10-28 LAB — CBC WITH DIFFERENTIAL/PLATELET
BASOS ABS: 0.1 10*3/uL (ref 0–0.1)
Basophils Relative: 1 %
Eosinophils Absolute: 0.1 10*3/uL (ref 0–0.7)
Eosinophils Relative: 1 %
HEMATOCRIT: 36.5 % (ref 35.0–47.0)
HEMOGLOBIN: 11.7 g/dL — AB (ref 12.0–16.0)
LYMPHS PCT: 10 %
Lymphs Abs: 1.3 10*3/uL (ref 1.0–3.6)
MCH: 28.4 pg (ref 26.0–34.0)
MCHC: 32 g/dL (ref 32.0–36.0)
MCV: 88.9 fL (ref 80.0–100.0)
Monocytes Absolute: 4.6 10*3/uL — ABNORMAL HIGH (ref 0.2–0.9)
Monocytes Relative: 36 %
NEUTROS PCT: 52 %
Neutro Abs: 6.6 10*3/uL — ABNORMAL HIGH (ref 1.4–6.5)
Platelets: 131 10*3/uL — ABNORMAL LOW (ref 150–440)
RBC: 4.11 MIL/uL (ref 3.80–5.20)
RDW: 14.6 % — ABNORMAL HIGH (ref 11.5–14.5)
WBC: 12.7 10*3/uL — AB (ref 3.6–11.0)

## 2016-10-28 NOTE — Progress Notes (Signed)
Patient here for f/u CLL. She has an excellent appetite.

## 2016-10-28 NOTE — Assessment & Plan Note (Signed)
#   CMML [chronic myelomonocytic leukemia]-1Normal cytogenetics. Today hemoglobin is 11.7; white count 7/mild monocytosis/ platelets 109 [stable. On surveillance.   No concern for any progression of disease at this time.  # CKD- stage III- stable.   # Elevated Blood pressure- recommend compliance with medications.   #  Patient will follow-up with me in 6 months/labs; # She was asked to call us sooner if noted any abnormal symptoms of bleeding. She agrees.

## 2016-10-28 NOTE — Progress Notes (Signed)
[place] OFFICE PROGRESS NOTE  Patient Care Team: [name], MD as PCP - General (Family Medicine)  HPI  SUMMARY of HEMATOLOGIC HISTORY:  Oncology History   [phone]-? CHRONIC MYELOMONOCYTIC LEUKEMIA-1 [NOV 2016- BMBx; [name]; atypical monocytes with multilinege dysplasia; <5% blasts; [name];  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   Overall patient is doing well. No admission the hospital recently.   Her breathing is improved. She continues to live with her son at home. She has been walking at home.  She denies any bleeding gums or easy bruising.  REVIEW OF SYSTEMS:  A complete 10 point review of system is done which is negative except mentioned above in history of present illness.   I have reviewed the past medical history, past surgical history, social history and family history with the patient and they are unchanged from previous note unless stated above.  ALLERGIES:  is allergic to sulfa antibiotics; sulfamethoxazole-trimethoprim; and aspirin.  MEDICATIONS:  Current Outpatient Prescriptions  Medication Sig Dispense Refill  . acetaminophen (TYLENOL) 650 MG CR tablet Take 1 tablet (650 mg total) by mouth every 6 (six) hours. 30 tablet 0  . amLODipine (NORVASC) 10 MG tablet Take 10 mg by mouth daily.     . Calcium Carbonate-Vitamin D (CALCIUM-VITAMIN D) 500-200 MG-UNIT tablet Take 1 tablet by mouth daily.    . Cholecalciferol (VITAMIN D3) 1000 units CAPS Take 1 capsule by mouth 1 day or 1 dose.    . docusate sodium  (COLACE) 100 MG capsule Take 100 mg by mouth 2 (two) times daily.    . febuxostat (ULORIC) 40 MG tablet Take 40 mg by mouth daily.     . Fish Oil-Cholecalciferol (FISH OIL + D3 PO) Take by mouth daily.    . fluticasone (FLONASE) 50 MCG/ACT nasal spray Place 1 spray into both nostrils daily. Reported on [id number]  11  . folic acid (FOLVITE) 1 MG tablet Take 1 tablet (1 mg total) by mouth daily. 30 tablet 0  . hydroxychloroquine (PLAQUENIL) 200 MG tablet Take 200 mg by mouth daily.    Marland Kitchen losartan (COZAAR) 25 MG tablet Take by mouth.    . metoprolol tartrate (LOPRESSOR) 25 MG tablet Take 25 mg by mouth 2 (two) times daily. Reported on 12/18/2015    . Multiple Vitamins-Minerals (ICAPS) CAPS Take 1 capsule by mouth 1 day or 1 dose.    . Multiple Vitamins-Minerals (MULTIVITAMIN WITH MINERALS) tablet Take by mouth.    . oxybutynin (DITROPAN) 5 MG tablet Take 5 mg by mouth daily.  5  . pantoprazole (PROTONIX) 40 MG tablet Take 40 mg by mouth daily before breakfast.    . Polyethyl Glycol-Propyl Glycol (SYSTANE) 0.4-0.3 % SOLN Apply to eye.     No current facility-administered medications for this visit.     PHYSICAL EXAMINATION:   BP (!) 175/88 (BP Location: Left Arm, Patient Position: Sitting)   Pulse 66   Temp 97.5 F (36.4 C) (Tympanic)   Resp 18   Ht 5\' 5"  (1.651 m)   Wt 142 lb 3.2 oz (64.5 kg)  BMI 23.66 kg/m    GENERAL:alert, no distress and comfortable. She is walking with a cane. She is alone. EYES: Positive for pallor. OROPHARYNX: Positive for dentures/ulceration. NECK: No thyromegaly LYMPH:  no palpable lymphadenopathy in the cervical, axillary or inguinal LUNGS: clear to auscultation with normal breathing effort; No Wheeze or crackles  Cardio-vascular- Regular Rate & Rythm and no murmurs; NO lower extremity edema ABDOMEN:abdomen soft, non-tender and normal bowel sounds; No hepato-splenomegaly.  Musculoskeletal:no cyanosis of digits and no clubbing  NEURO: alert & oriented x 3  with fluent speech, no focal motor/sensory deficits. SKIN: no skin rash   LABORATORY DATA:  I have reviewed the data as listed    Component Value Date/Time   NA 139 10/28/2016 1104   NA 140 03/14/2014 1150   K 4.1 10/28/2016 1104   K 4.1 04/11/2014 1037   CL 104 10/28/2016 1104   CL 104 03/14/2014 1150   CO2 28 10/28/2016 1104   CO2 28 03/14/2014 1150   GLUCOSE 87 10/28/2016 1104   GLUCOSE 86 03/14/2014 1150   BUN 31 (H) 10/28/2016 1104   BUN 35 (H) 03/14/2014 1150   CREATININE 1.28 (H) 10/28/2016 1104   CREATININE 1.63 (H) 10/17/2014 1056   CALCIUM 8.6 (L) 10/28/2016 1104   CALCIUM 8.8 03/14/2014 1150   PROT 7.7 10/28/2016 1104   PROT 9.1 (H) 06/28/2013 1628   ALBUMIN 3.6 10/28/2016 1104   ALBUMIN 2.6 (L) 06/28/2013 1628   AST 16 10/28/2016 1104   AST 16 06/28/2013 1628   ALT 15 10/28/2016 1104   ALT 12 06/28/2013 1628   ALKPHOS 55 10/28/2016 1104   ALKPHOS 46 09/27/2013 0929   BILITOT 0.6 10/28/2016 1104   BILITOT 0.3 06/16/2014 1220   GFRNONAA 40 (L) 10/28/2016 1104   GFRNONAA 33 (L) 10/17/2014 1056   GFRNONAA 31 (L) 06/20/2014 1356   GFRAA 47 (L) 10/28/2016 1104   GFRAA 40 (L) 10/17/2014 1056   GFRAA 36 (L) 06/20/2014 1356    No results found for: SPEP, UPEP  Lab Results  Component Value Date   WBC 12.7 (H) 10/28/2016   NEUTROABS 6.6 (H) 10/28/2016   HGB 11.7 (L) 10/28/2016   HCT 36.5 10/28/2016   MCV 88.9 10/28/2016   PLT 131 (L) 10/28/2016      Chemistry      Component Value Date/Time   NA 139 10/28/2016 1104   NA 140 03/14/2014 1150   K 4.1 10/28/2016 1104   K 4.1 04/11/2014 1037   CL 104 10/28/2016 1104   CL 104 03/14/2014 1150   CO2 28 10/28/2016 1104   CO2 28 03/14/2014 1150   BUN 31 (H) 10/28/2016 1104   BUN 35 (H) 03/14/2014 1150   CREATININE 1.28 (H) 10/28/2016 1104   CREATININE 1.63 (H) 10/17/2014 1056      Component Value Date/Time   CALCIUM 8.6 (L) 10/28/2016 1104   CALCIUM 8.8 03/14/2014 1150   ALKPHOS 55 10/28/2016 1104    ALKPHOS 46 09/27/2013 0929   AST 16 10/28/2016 1104   AST 16 06/28/2013 1628   ALT 15 10/28/2016 1104   ALT 12 06/28/2013 1628   BILITOT 0.6 10/28/2016 1104   BILITOT 0.3 06/16/2014 1220     ASSESSMENT & PLAN:   Chronic myelomonocytic leukemia not having achieved remission ([place]) # CMML [chronic myelomonocytic leukemia]-1Normal cytogenetics. Today hemoglobin is 11.7; white count 7/mild monocytosis/ platelets 109 [stable. On surveillance.   No concern for any progression of disease at this time.  # CKD- stage  III- stable.   # Elevated Blood pressure- recommend compliance with medications.   #  Patient will follow-up with me in 6 months/labs; # She was asked to call us sooner if noted any abnormal symptoms of bleeding. She agrees.    Cammie Sickle, MD 10/28/2016 12:36 PM

## 2017-04-30 ENCOUNTER — Other Ambulatory Visit: Payer: Self-pay | Admitting: Family Medicine

## 2017-04-30 DIAGNOSIS — Z1231 Encounter for screening mammogram for malignant neoplasm of breast: Secondary | ICD-10-CM

## 2017-05-05 ENCOUNTER — Other Ambulatory Visit: Payer: Medicare Other

## 2017-05-05 ENCOUNTER — Inpatient Hospital Stay: Payer: Medicare HMO

## 2017-05-05 ENCOUNTER — Ambulatory Visit: Payer: Medicare Other | Admitting: Internal Medicine

## 2017-05-05 ENCOUNTER — Inpatient Hospital Stay: Payer: Medicare HMO | Attending: Internal Medicine | Admitting: Internal Medicine

## 2017-05-05 VITALS — BP 166/86 | HR 55 | Temp 97.7°F | Resp 18 | Ht 65.0 in | Wt 141.2 lb

## 2017-05-05 DIAGNOSIS — Z79899 Other long term (current) drug therapy: Secondary | ICD-10-CM | POA: Diagnosis not present

## 2017-05-05 DIAGNOSIS — I129 Hypertensive chronic kidney disease with stage 1 through stage 4 chronic kidney disease, or unspecified chronic kidney disease: Secondary | ICD-10-CM | POA: Diagnosis not present

## 2017-05-05 DIAGNOSIS — N183 Chronic kidney disease, stage 3 (moderate): Secondary | ICD-10-CM | POA: Diagnosis not present

## 2017-05-05 DIAGNOSIS — C9310 Chronic myelomonocytic leukemia not having achieved remission: Secondary | ICD-10-CM | POA: Insufficient documentation

## 2017-05-05 LAB — COMPREHENSIVE METABOLIC PANEL
ALT: 12 U/L — ABNORMAL LOW (ref 14–54)
ANION GAP: 8 (ref 5–15)
AST: 17 U/L (ref 15–41)
Albumin: 3.7 g/dL (ref 3.5–5.0)
Alkaline Phosphatase: 52 U/L (ref 38–126)
BUN: 28 mg/dL — ABNORMAL HIGH (ref 6–20)
CO2: 31 mmol/L (ref 22–32)
Calcium: 9.1 mg/dL (ref 8.9–10.3)
Chloride: 102 mmol/L (ref 101–111)
Creatinine, Ser: 1.24 mg/dL — ABNORMAL HIGH (ref 0.44–1.00)
GFR, EST AFRICAN AMERICAN: 48 mL/min — AB (ref >60)
GFR, EST NON AFRICAN AMERICAN: 42 mL/min — AB (ref >60)
Glucose, Bld: 88 mg/dL (ref 65–99)
POTASSIUM: 4 mmol/L (ref 3.5–5.1)
Sodium: 141 mmol/L (ref 135–145)
Total Bilirubin: 0.5 mg/dL (ref 0.3–1.2)
Total Protein: 8.1 g/dL (ref 6.5–8.1)

## 2017-05-05 LAB — CBC WITH DIFFERENTIAL/PLATELET
Basophils Absolute: 0.2 10*3/uL — ABNORMAL HIGH (ref 0–0.1)
Basophils Relative: 2 %
Eosinophils Absolute: 0.1 10*3/uL (ref 0–0.7)
Eosinophils Relative: 1 %
HCT: 37.6 % (ref 35.0–47.0)
Hemoglobin: 12.2 g/dL (ref 12.0–16.0)
LYMPHS ABS: 1.4 10*3/uL (ref 1.0–3.6)
Lymphocytes Relative: 11 %
MCH: 28.9 pg (ref 26.0–34.0)
MCHC: 32.4 g/dL (ref 32.0–36.0)
MCV: 89.2 fL (ref 80.0–100.0)
MONOS PCT: 36 %
Monocytes Absolute: 4.4 10*3/uL — ABNORMAL HIGH (ref 0.2–0.9)
NEUTROS ABS: 6.2 10*3/uL (ref 1.4–6.5)
Neutrophils Relative %: 50 %
Platelets: 103 10*3/uL — ABNORMAL LOW (ref 150–440)
RBC: 4.21 MIL/uL (ref 3.80–5.20)
RDW: 14 % (ref 11.5–14.5)
WBC: 12.3 10*3/uL — ABNORMAL HIGH (ref 3.6–11.0)

## 2017-05-05 LAB — LACTATE DEHYDROGENASE: LDH: 130 U/L (ref 98–192)

## 2017-05-05 NOTE — Progress Notes (Signed)
[place] OFFICE PROGRESS NOTE  Patient Care Team: [name], MD as PCP - General (Family Medicine)  HPI  SUMMARY of HEMATOLOGIC HISTORY:  Oncology History   [phone]-? CHRONIC MYELOMONOCYTIC LEUKEMIA-1 [NOV 2016- BMBx; [name]; atypical monocytes with multilinege dysplasia; <5% blasts; [name];  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   In the interim- fractured her "neck"; in neck brace.She denies falls. Otherwise, overall patient is doing well. No admission the hospital recently.   Her breathing is improved. She continues to live with her son at home. She has been walking at home.  She denies any bleeding gums or easy bruising.  REVIEW OF SYSTEMS:  A complete 10 point review of system is done which is negative except mentioned above in history of present illness.   I have reviewed the past medical history, past surgical history, social history and family history with the patient and they are unchanged from previous note unless stated above.  ALLERGIES:  is allergic to sulfa antibiotics; sulfamethoxazole-trimethoprim; and aspirin.  MEDICATIONS:  Current Outpatient Prescriptions  Medication Sig Dispense Refill  . acetaminophen (TYLENOL) 650 MG CR tablet Take 1 tablet (650 mg total) by mouth every 6 (six) hours. 30 tablet 0  . amLODipine (NORVASC) 10 MG tablet Take 10 mg by mouth daily.     . Calcium Carbonate-Vitamin D (CALCIUM-VITAMIN D) 500-200 MG-UNIT tablet Take 1 tablet by mouth daily.    . Cholecalciferol (VITAMIN D3)  1000 units CAPS Take 1 capsule by mouth 1 day or 1 dose.    . docusate sodium (COLACE) 100 MG capsule Take 100 mg by mouth 2 (two) times daily.    . febuxostat (ULORIC) 40 MG tablet Take 40 mg by mouth daily.     . Fish Oil-Cholecalciferol (FISH OIL + D3 PO) Take by mouth daily.    . fluticasone (FLONASE) 50 MCG/ACT nasal spray Place 1 spray into both nostrils daily. Reported on 12/19/2503  11  . folic acid (FOLVITE) 1 MG tablet Take 1 tablet (1 mg total) by mouth daily. 30 tablet 0  . hydroxychloroquine (PLAQUENIL) 200 MG tablet Take 200 mg by mouth daily.    Marland Kitchen losartan (COZAAR) 25 MG tablet Take by mouth.    . metoprolol tartrate (LOPRESSOR) 25 MG tablet Take 25 mg by mouth 2 (two) times daily. Reported on 12/18/2015    . Multiple Vitamins-Minerals (ICAPS) CAPS Take 1 capsule by mouth 1 day or 1 dose.    . oxybutynin (DITROPAN) 5 MG tablet Take 5 mg by mouth daily.  5  . pantoprazole (PROTONIX) 40 MG tablet Take 40 mg by mouth daily before breakfast.    . Polyethyl Glycol-Propyl Glycol (SYSTANE) 0.4-0.3 % SOLN Apply to eye.    . furosemide (LASIX) 20 MG tablet Take 1 tablet by mouth daily.  11  . vitamin B-12 (CYANOCOBALAMIN) 1000 MCG tablet Take 1 tablet by mouth daily.     No current facility-administered medications for this visit.     PHYSICAL EXAMINATION:   BP (!) 166/86 (BP Location: Right Arm, Patient Position: Sitting)   Pulse Marland Kitchen)  55   Temp 97.7 F (36.5 C) (Tympanic)   Resp 18   Ht 5\' 5"  (1.651 m)   Wt 141 lb 3.2 oz (64 kg)   BMI 23.50 kg/m    GENERAL:alert, no distress and comfortable. She is walking with a cane. She is alone. EYES: No pallor or icterus.. OROPHARYNX: Positive for dentures/ulceration. NECK: No thyromegaly; neck brace.  LYMPH:  no palpable lymphadenopathy in the cervical, axillary or inguinal LUNGS: clear to auscultation with normal breathing effort; No Wheeze or crackles  Cardio-vascular- Regular Rate & Rythm and no murmurs; 1+ bilateral lower extremity  edema. lower extremity edema ABDOMEN:abdomen soft, non-tender and normal bowel sounds; No hepato-splenomegaly.  Musculoskeletal:no cyanosis of digits and no clubbing  NEURO: alert & oriented x 3 with fluent speech, no focal motor/sensory deficits. SKIN: no skin rash   LABORATORY DATA:  I have reviewed the data as listed    Component Value Date/Time   NA 139 10/28/2016 1104   NA 140 03/14/2014 1150   K 4.1 10/28/2016 1104   K 4.1 04/11/2014 1037   CL 104 10/28/2016 1104   CL 104 03/14/2014 1150   CO2 28 10/28/2016 1104   CO2 28 03/14/2014 1150   GLUCOSE 87 10/28/2016 1104   GLUCOSE 86 03/14/2014 1150   BUN 31 (H) 10/28/2016 1104   BUN 35 (H) 03/14/2014 1150   CREATININE 1.28 (H) 10/28/2016 1104   CREATININE 1.63 (H) 10/17/2014 1056   CALCIUM 8.6 (L) 10/28/2016 1104   CALCIUM 8.8 03/14/2014 1150   PROT 7.7 10/28/2016 1104   PROT 9.1 (H) 06/28/2013 1628   ALBUMIN 3.6 10/28/2016 1104   ALBUMIN 2.6 (L) 06/28/2013 1628   AST 16 10/28/2016 1104   AST 16 06/28/2013 1628   ALT 15 10/28/2016 1104   ALT 12 06/28/2013 1628   ALKPHOS 55 10/28/2016 1104   ALKPHOS 46 09/27/2013 0929   BILITOT 0.6 10/28/2016 1104   BILITOT 0.3 06/16/2014 1220   GFRNONAA 40 (L) 10/28/2016 1104   GFRNONAA 33 (L) 10/17/2014 1056   GFRNONAA 31 (L) 06/20/2014 1356   GFRAA 47 (L) 10/28/2016 1104   GFRAA 40 (L) 10/17/2014 1056   GFRAA 36 (L) 06/20/2014 1356    No results found for: SPEP, UPEP  Lab Results  Component Value Date   WBC 12.3 (H) 05/05/2017   NEUTROABS PENDING 05/05/2017   HGB 12.2 05/05/2017   HCT 37.6 05/05/2017   MCV 89.2 05/05/2017   PLT 103 (L) 05/05/2017      Chemistry      Component Value Date/Time   NA 139 10/28/2016 1104   NA 140 03/14/2014 1150   K 4.1 10/28/2016 1104   K 4.1 04/11/2014 1037   CL 104 10/28/2016 1104   CL 104 03/14/2014 1150   CO2 28 10/28/2016 1104   CO2 28 03/14/2014 1150   BUN 31 (H) 10/28/2016 1104   BUN 35 (H) 03/14/2014 1150   CREATININE 1.28  (H) 10/28/2016 1104   CREATININE 1.63 (H) 10/17/2014 1056      Component Value Date/Time   CALCIUM 8.6 (L) 10/28/2016 1104   CALCIUM 8.8 03/14/2014 1150   ALKPHOS 55 10/28/2016 1104   ALKPHOS 46 09/27/2013 0929   AST 16 10/28/2016 1104   AST 16 06/28/2013 1628   ALT 15 10/28/2016 1104   ALT 12 06/28/2013 1628   BILITOT 0.6 10/28/2016 1104   BILITOT 0.3 06/16/2014 1220     ASSESSMENT & PLAN:   Chronic myelomonocytic leukemia not having achieved  remission ([place]) # CMML [chronic myelomonocytic leukemia]-1Normal cytogenetics. Today hemoglobin is 12.2; white count 12/mild monocytosis/ platelets 103 [stable. On surveillance.  No concern for any progression of disease at this time.  # CKD- stage III- stable. Labs pending from today.   # Elevated Blood pressure- again reminded compliance with medications/diet.   #  Patient will follow-up with me in 6 months/labs; # She was asked to call us sooner if noted any abnormal symptoms of bleeding. She agrees.    Cammie Sickle, MD 05/05/2017 9:30 AM

## 2017-05-05 NOTE — Assessment & Plan Note (Addendum)
#   CMML [chronic myelomonocytic leukemia]-1Normal cytogenetics. Today hemoglobin is 12.2; white count 12/mild monocytosis/ platelets 103 [stable. On surveillance.  No concern for any progression of disease at this time.  # CKD- stage III- stable. Labs pending from today.   # Elevated Blood pressure- again reminded compliance with medications/diet.   #  Patient will follow-up with me in 6 months/labs; # She was asked to call us sooner if noted any abnormal symptoms of bleeding. She agrees.

## 2017-06-09 ENCOUNTER — Ambulatory Visit
Admission: RE | Admit: 2017-06-09 | Discharge: 2017-06-09 | Disposition: A | Discharge status: Home / Self Care | Payer: Medicare HMO | Source: Ambulatory Visit | Attending: Family Medicine | Admitting: Family Medicine

## 2017-06-09 DIAGNOSIS — Z1231 Encounter for screening mammogram for malignant neoplasm of breast: Secondary | ICD-10-CM | POA: Insufficient documentation

## 2017-11-03 ENCOUNTER — Inpatient Hospital Stay: Payer: Medicare HMO | Attending: Internal Medicine | Admitting: Internal Medicine

## 2017-11-03 ENCOUNTER — Inpatient Hospital Stay: Payer: Medicare HMO

## 2017-11-03 VITALS — BP 165/65 | HR 54 | Temp 97.8°F | Wt 149.3 lb

## 2017-11-03 DIAGNOSIS — R350 Frequency of micturition: Secondary | ICD-10-CM | POA: Insufficient documentation

## 2017-11-03 DIAGNOSIS — C9310 Chronic myelomonocytic leukemia not having achieved remission: Secondary | ICD-10-CM

## 2017-11-03 DIAGNOSIS — N183 Chronic kidney disease, stage 3 (moderate): Secondary | ICD-10-CM | POA: Insufficient documentation

## 2017-11-03 DIAGNOSIS — I10 Essential (primary) hypertension: Secondary | ICD-10-CM | POA: Diagnosis not present

## 2017-11-03 LAB — COMPREHENSIVE METABOLIC PANEL
ALBUMIN: 3.6 g/dL (ref 3.5–5.0)
ALT: 13 U/L — ABNORMAL LOW (ref 14–54)
ANION GAP: 6 (ref 5–15)
AST: 16 U/L (ref 15–41)
Alkaline Phosphatase: 56 U/L (ref 38–126)
BUN: 28 mg/dL — ABNORMAL HIGH (ref 6–20)
CO2: 30 mmol/L (ref 22–32)
Calcium: 8.8 mg/dL — ABNORMAL LOW (ref 8.9–10.3)
Chloride: 102 mmol/L (ref 101–111)
Creatinine, Ser: 1.32 mg/dL — ABNORMAL HIGH (ref 0.44–1.00)
GFR calc Af Amer: 45 mL/min — ABNORMAL LOW (ref >60)
GFR calc non Af Amer: 39 mL/min — ABNORMAL LOW (ref >60)
GLUCOSE: 93 mg/dL (ref 65–99)
POTASSIUM: 3.8 mmol/L (ref 3.5–5.1)
SODIUM: 138 mmol/L (ref 135–145)
Total Bilirubin: 0.9 mg/dL (ref 0.3–1.2)
Total Protein: 8 g/dL (ref 6.5–8.1)

## 2017-11-03 LAB — CBC WITH DIFFERENTIAL/PLATELET
BASOS ABS: 0.1 10*3/uL (ref 0–0.1)
Basophils Relative: 1 %
EOS ABS: 0.1 10*3/uL (ref 0–0.7)
Eosinophils Relative: 1 %
HCT: 35.3 % (ref 35.0–47.0)
Hemoglobin: 11.6 g/dL — ABNORMAL LOW (ref 12.0–16.0)
LYMPHS ABS: 0.8 10*3/uL — AB (ref 1.0–3.6)
LYMPHS PCT: 8 %
MCH: 29.1 pg (ref 26.0–34.0)
MCHC: 32.9 g/dL (ref 32.0–36.0)
MCV: 88.6 fL (ref 80.0–100.0)
MONO ABS: 3.7 10*3/uL — AB (ref 0.2–0.9)
Monocytes Relative: 37 %
NEUTROS ABS: 5.2 10*3/uL (ref 1.4–6.5)
Neutrophils Relative %: 53 %
PLATELETS: 98 10*3/uL — AB (ref 150–440)
RBC: 3.98 MIL/uL (ref 3.80–5.20)
RDW: 15 % — ABNORMAL HIGH (ref 11.5–14.5)
WBC: 9.9 10*3/uL (ref 3.6–11.0)

## 2017-11-03 LAB — LACTATE DEHYDROGENASE: LDH: 116 U/L (ref 98–192)

## 2017-11-03 NOTE — Progress Notes (Signed)
[place] OFFICE PROGRESS NOTE  Patient Care Team: [name], MD as PCP - General (Family Medicine)  HPI  SUMMARY of HEMATOLOGIC HISTORY:  Oncology History   [phone]-? CHRONIC MYELOMONOCYTIC LEUKEMIA-1 [NOV 2016- BMBx; [name]; atypical monocytes with multilinege dysplasia; <5% blasts; [name];  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   Patient has had no recent hospitalizations. Her breathing is improved. She continues to live with her son at home.  She walks with a walker.  She denies any bleeding gums or easy bruising.  No infections.  She complains of increased urinary frequency without dysuria or pain for the last 1 year-and as per PCP attributed to her water pills.  REVIEW OF SYSTEMS:  A complete 10 point review of system is done which is negative except mentioned above in history of present illness.   I have reviewed the past medical history, past surgical history, social history and family history with the patient and they are unchanged from previous note unless stated above.  ALLERGIES:  is allergic to sulfa antibiotics; sulfamethoxazole-trimethoprim; and aspirin.  MEDICATIONS:  Current Outpatient Medications  Medication Sig Dispense Refill  . acetaminophen (TYLENOL) 650 MG CR tablet Take 1 tablet (650 mg total) by mouth every 6 (six) hours. 30 tablet 0  . amLODipine (NORVASC) 10 MG tablet Take 10 mg by mouth daily.     . Calcium Carbonate-Vitamin D (CALCIUM-VITAMIN D) 500-200 MG-UNIT tablet Take 1 tablet by mouth  daily.    . Cholecalciferol (VITAMIN D3) 1000 units CAPS Take 1 capsule by mouth 1 day or 1 dose.    . docusate sodium (COLACE) 100 MG capsule Take 100 mg by mouth 2 (two) times daily.    . febuxostat (ULORIC) 40 MG tablet Take 40 mg by mouth daily.     . Fish Oil-Cholecalciferol (FISH OIL + D3 PO) Take by mouth daily.    . fluticasone (FLONASE) 50 MCG/ACT nasal spray Place 1 spray into both nostrils daily. Reported on 10/19/5100  11  . folic acid (FOLVITE) 1 MG tablet Take 1 tablet (1 mg total) by mouth daily. 30 tablet 0  . furosemide (LASIX) 20 MG tablet Take 1 tablet by mouth daily.  11  . hydroxychloroquine (PLAQUENIL) 200 MG tablet Take 200 mg by mouth daily.    Marland Kitchen losartan (COZAAR) 25 MG tablet Take by mouth.    . metoprolol tartrate (LOPRESSOR) 25 MG tablet Take 25 mg by mouth 2 (two) times daily. Reported on 12/18/2015    . Multiple Vitamins-Minerals (ICAPS) CAPS Take 1 capsule by mouth 1 day or 1 dose.    . oxybutynin (DITROPAN) 5 MG tablet Take 5 mg by mouth daily.  5  . pantoprazole (PROTONIX) 40 MG tablet Take 40 mg by mouth daily before breakfast.    . Polyethyl Glycol-Propyl Glycol (SYSTANE) 0.4-0.3 % SOLN Apply to eye.    . vitamin B-12 (CYANOCOBALAMIN) 1000 MCG tablet Take 1 tablet by mouth daily.     No current facility-administered medications for this visit.     PHYSICAL EXAMINATION:   BP (!) 165/65 (BP Location:  Left Arm, Patient Position: Sitting)   Pulse (!) 54   Temp 97.8 F (36.6 C) (Oral)   Wt 149 lb 4 oz (67.7 kg)   SpO2 96%   BMI 24.84 kg/m    GENERAL:alert, no distress and comfortable. She is walking with a cane. She is alone. EYES: No pallor or icterus.. OROPHARYNX: Positive for dentures/ulceration. NECK: No thyromegaly; neck brace.  LYMPH:  no palpable lymphadenopathy in the cervical, axillary or inguinal LUNGS: clear to auscultation with normal breathing effort; No Wheeze or crackles  Cardio-vascular- Regular Rate & Rythm and no murmurs; 1+ bilateral  lower extremity edema. lower extremity edema ABDOMEN:abdomen soft, non-tender and normal bowel sounds; No hepato-splenomegaly.  Musculoskeletal:no cyanosis of digits and no clubbing  NEURO: alert & oriented x 3 with fluent speech, no focal motor/sensory deficits. SKIN: no skin rash   LABORATORY DATA:  I have reviewed the data as listed    Component Value Date/Time   NA 138 11/03/2017 0956   NA 140 03/14/2014 1150   K 3.8 11/03/2017 0956   K 4.1 04/11/2014 1037   CL 102 11/03/2017 0956   CL 104 03/14/2014 1150   CO2 30 11/03/2017 0956   CO2 28 03/14/2014 1150   GLUCOSE 93 11/03/2017 0956   GLUCOSE 86 03/14/2014 1150   BUN 28 (H) 11/03/2017 0956   BUN 35 (H) 03/14/2014 1150   CREATININE 1.32 (H) 11/03/2017 0956   CREATININE 1.63 (H) 10/17/2014 1056   CALCIUM 8.8 (L) 11/03/2017 0956   CALCIUM 8.8 03/14/2014 1150   PROT 8.0 11/03/2017 0956   PROT 9.1 (H) 06/28/2013 1628   ALBUMIN 3.6 11/03/2017 0956   ALBUMIN 2.6 (L) 06/28/2013 1628   AST 16 11/03/2017 0956   AST 16 06/28/2013 1628   ALT 13 (L) 11/03/2017 0956   ALT 12 06/28/2013 1628   ALKPHOS 56 11/03/2017 0956   ALKPHOS 46 09/27/2013 0929   BILITOT 0.9 11/03/2017 0956   BILITOT 0.3 06/16/2014 1220   GFRNONAA 39 (L) 11/03/2017 0956   GFRNONAA 33 (L) 10/17/2014 1056   GFRNONAA 31 (L) 06/20/2014 1356   GFRAA 45 (L) 11/03/2017 0956   GFRAA 40 (L) 10/17/2014 1056   GFRAA 36 (L) 06/20/2014 1356    No results found for: SPEP, UPEP  Lab Results  Component Value Date   WBC 9.9 11/03/2017   NEUTROABS PENDING 11/03/2017   HGB 11.6 (L) 11/03/2017   HCT 35.3 11/03/2017   MCV 88.6 11/03/2017   PLT 98 (L) 11/03/2017      Chemistry      Component Value Date/Time   NA 138 11/03/2017 0956   NA 140 03/14/2014 1150   K 3.8 11/03/2017 0956   K 4.1 04/11/2014 1037   CL 102 11/03/2017 0956   CL 104 03/14/2014 1150   CO2 30 11/03/2017 0956   CO2 28 03/14/2014 1150   BUN 28 (H) 11/03/2017 0956   BUN 35 (H) 03/14/2014 1150    CREATININE 1.32 (H) 11/03/2017 0956   CREATININE 1.63 (H) 10/17/2014 1056      Component Value Date/Time   CALCIUM 8.8 (L) 11/03/2017 0956   CALCIUM 8.8 03/14/2014 1150   ALKPHOS 56 11/03/2017 0956   ALKPHOS 46 09/27/2013 0929   AST 16 11/03/2017 0956   AST 16 06/28/2013 1628   ALT 13 (L) 11/03/2017 0956   ALT 12 06/28/2013 1628   BILITOT 0.9 11/03/2017 0956   BILITOT 0.3 06/16/2014 1220     ASSESSMENT & PLAN:   Chronic myelomonocytic  leukemia not having achieved remission ([place]) # CMML [chronic myelomonocytic leukemia]-with normal cytogenetics. Today hemoglobin is 11.2; white count 9/mild monocytosis/ platelets 99 [stable. On surveillance.  No concern for any progression of disease at this time.  # CKD- stage III- stable.creat 1.3 today.   # Elevated Blood pressure- again reminded compliance with medications/diet.   #  Patient will follow-up with me in 6 months/labs; # She was asked to call us sooner if noted any abnormal symptoms of bleeding. She agrees.  Cc; Dr.George.     Cammie Sickle, MD 11/03/2017 11:01 AM

## 2017-11-03 NOTE — Assessment & Plan Note (Addendum)
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 Today hemoglobin is 11.2; white count 9/mild monocytosis/ platelets 99 [stable. On surveillance.  No concern for any progression of disease at this time.  # CKD- stage III- stable.creat 1.3 today.   # Elevated Blood pressure- again reminded compliance with medications/diet.   #  Patient will follow-up with me in 6 months/labs; # She was asked to call us sooner if noted any abnormal symptoms of bleeding. She agrees.  Cc; Dr.George.  

## 2018-03-05 ENCOUNTER — Other Ambulatory Visit
Admission: RE | Admit: 2018-03-05 | Discharge: 2018-03-05 | Disposition: A | Discharge status: Home / Self Care | Payer: Medicare HMO | Source: Ambulatory Visit | Attending: Physician Assistant | Admitting: Physician Assistant

## 2018-03-05 ENCOUNTER — Encounter: Payer: Medicare HMO | Attending: Physician Assistant | Admitting: Physician Assistant

## 2018-03-05 DIAGNOSIS — I10 Essential (primary) hypertension: Secondary | ICD-10-CM | POA: Insufficient documentation

## 2018-03-05 DIAGNOSIS — B999 Unspecified infectious disease: Secondary | ICD-10-CM | POA: Insufficient documentation

## 2018-03-05 DIAGNOSIS — M109 Gout, unspecified: Secondary | ICD-10-CM | POA: Diagnosis not present

## 2018-03-05 DIAGNOSIS — M069 Rheumatoid arthritis, unspecified: Secondary | ICD-10-CM | POA: Diagnosis not present

## 2018-03-05 DIAGNOSIS — L97812 Non-pressure chronic ulcer of other part of right lower leg with fat layer exposed: Secondary | ICD-10-CM | POA: Insufficient documentation

## 2018-03-05 DIAGNOSIS — I890 Lymphedema, not elsewhere classified: Secondary | ICD-10-CM | POA: Insufficient documentation

## 2018-03-05 DIAGNOSIS — C9311 Chronic myelomonocytic leukemia, in remission: Secondary | ICD-10-CM | POA: Insufficient documentation

## 2018-03-05 DIAGNOSIS — L97822 Non-pressure chronic ulcer of other part of left lower leg with fat layer exposed: Secondary | ICD-10-CM | POA: Insufficient documentation

## 2018-03-07 NOTE — Progress Notes (Signed)
CINA, KLUMPP ([id number]) Visit Report for 03/05/2018 Allergy List Details Patient Name: [name], PINON Date of Service: 03/05/2018 12:30 PM Medical Record Number: [id number] Patient Account Number: [id number] Date of Birth/Sex: 29-Jun-1941 (77 y.o. Female) Treating RN: Roger Shelter Primary Care Martena Emanuele: Salome Holmes Other Clinician: Referring Sherryn Pollino: Salome Holmes Treating Chapman Matteucci/Extender: Melburn Hake, HOYT Weeks in Treatment: 0 Allergies Active Allergies no known allergies Allergy Notes Electronic Signature(s) Signed: 03/05/2018 4:56:16 PM By: Roger Shelter Entered By: Roger Shelter on 03/05/2018 12:44:38 Hover, [name] ([id number]) -------------------------------------------------------------------------------- Arrival Information Details Patient Name: [name] Date of Service: 03/05/2018 12:30 PM Medical Record Number: [id number] Patient Account Number: [id number] Date of Birth/Sex: 15-Feb-1941 (77 y.o. Female) Treating RN: Roger Shelter Primary Care Janesia Joswick: Salome Holmes Other Clinician: Referring Juliane Guest: Salome Holmes Treating Deaisha Welborn/Extender: Melburn Hake, HOYT Weeks in Treatment: 0 Visit Information Patient Arrived: Cane Arrival Time: 12:41 Accompanied By: son Transfer Assistance: None Patient Identification Verified: Yes Secondary Verification Process Completed: Yes Electronic Signature(s) Signed: 03/05/2018 4:56:16 PM By: Roger Shelter Entered By: Roger Shelter on 03/05/2018 12:42:10 Boudreau, [name] ([id number]) -------------------------------------------------------------------------------- Clinic Level of Care Assessment Details Patient Name: [name] Date of Service: 03/05/2018 12:30 PM Medical Record Number: [id number] Patient Account Number: [id number] Date of Birth/Sex: 14-Aug-1941 (76 y.o. Female) Treating RN: Montey Hora Primary Care Lamara Brecht: Salome Holmes Other Clinician: Referring Kimothy Kishimoto: Salome Holmes Treating Marybel Alcott/Extender: Melburn Hake, HOYT Weeks in Treatment: 0 Clinic Level of Care Assessment Items TOOL 1 Quantity Score []  - Use when EandM and Procedure is performed on INITIAL visit 0 ASSESSMENTS - Nursing Assessment / Reassessment X - General Physical Exam (combine w/ comprehensive assessment (listed just below) when 1 20 performed on new pt. evals) X- 1 25 Comprehensive Assessment (HX, ROS, Risk Assessments, Wounds Hx, etc.) ASSESSMENTS - Wound and Skin Assessment / Reassessment []  - Dermatologic / Skin Assessment (not related to wound area) 0 ASSESSMENTS - Ostomy and/or Continence Assessment and Care []  - Incontinence Assessment and Management 0 []  - 0 Ostomy Care Assessment and Management (repouching, etc.) PROCESS - Coordination of Care X - Simple Patient / Family Education for ongoing care 1 15 []  - 0 Complex (extensive) Patient / Family Education for ongoing care X- 1 10 Staff obtains Programmer, systems, Records, Test Results / Process Orders []  - 0 Staff telephones HHA, Nursing Homes / Clarify orders / etc []  - 0 Routine Transfer to another Facility (non-emergent condition) []  - 0 Routine Hospital Admission (non-emergent condition) X- 1 15 New Admissions / Biomedical engineer / Ordering NPWT, Apligraf, etc. []  - 0 Emergency Hospital Admission (emergent condition) PROCESS - Special Needs []  - Pediatric / Minor Patient Management 0 []  - 0 Isolation Patient Management []  - 0 Hearing / Language / Visual special needs []  - 0 Assessment of Community assistance (transportation, D/C planning, etc.) []  - 0 Additional assistance / Altered mentation []  - 0 Support Surface(s) Assessment (bed, cushion, seat, etc.) Fedak, Xitlally M. ([id number]) INTERVENTIONS - Miscellaneous []  - External ear exam 0 []  - 0 Patient Transfer (multiple staff / Civil Service fast streamer / Similar devices) []  - 0 Simple Staple / Suture removal (25 or less) []  - 0 Complex Staple / Suture removal (26  or more) []  - 0 Hypo/Hyperglycemic Management (do not check if billed separately) []  - 0 Ankle / Brachial Index (ABI) - do not check if billed separately Has the patient been seen at the hospital within the last three years: Yes Total Score: 85 Level Of Care: New/Established - Level 3 Electronic  Signature(s) Signed: 03/05/2018 5:24:01 PM By: Montey Hora Entered By: Montey Hora on 03/05/2018 14:17:24 Heo, [name] ([id number]) -------------------------------------------------------------------------------- Encounter Discharge Information Details Patient Name: [name] Date of Service: 03/05/2018 12:30 PM Medical Record Number: [id number] Patient Account Number: [id number] Date of Birth/Sex: 18-Oct-1940 (77 y.o. Female) Treating RN: Cornell Barman Primary Care Antwon Rochin: Salome Holmes Other Clinician: Referring Niala Stcharles: Salome Holmes Treating Serah Nicoletti/Extender: Melburn Hake, HOYT Weeks in Treatment: 0 Encounter Discharge Information Items Discharge Condition: Stable Ambulatory Status: Cane Discharge Destination: Home Transportation: Private Auto Accompanied By: son Schedule Follow-up Appointment: Yes Clinical Summary of Care: Electronic Signature(s) Signed: 03/05/2018 2:22:42 PM By: Gretta Cool, BSN, RN, CWS, Kim RN, BSN Entered By: Gretta Cool, BSN, RN, CWS, Kim on 03/05/2018 14:22:42 Stapp, [name] ([id number]) -------------------------------------------------------------------------------- Lower Extremity Assessment Details Patient Name: [name] Date of Service: 03/05/2018 12:30 PM Medical Record Number: [id number] Patient Account Number: [id number] Date of Birth/Sex: 1941/08/30 (76 y.o. Female) Treating RN: Roger Shelter Primary Care Ozetta Flatley: Salome Holmes Other Clinician: Referring Arista Kettlewell: Salome Holmes Treating Maribella Kuna/Extender: Melburn Hake, HOYT Weeks in Treatment: 0 Edema Assessment Assessed: [Left: No] [Right: No] Edema: [Left: Yes] [Right: Yes] Calf Left:  Right: Point of Measurement: 35 cm From Medial Instep 43.2 cm 35.7 cm Ankle Left: Right: Point of Measurement: 12 cm From Medial Instep 31.4 cm 30.5 cm Vascular Assessment Claudication: Claudication Assessment [Left:None] [Right:None] Pulses: Dorsalis Pedis Palpable: [Left:Yes] [Right:Yes] Posterior Tibial Extremity colors, hair growth, and conditions: Extremity Color: [Left:Red] [Right:Normal] Hair Growth on Extremity: [Left:No] [Right:No] Temperature of Extremity: [Left:Hot] [Right:Warm] Capillary Refill: [Left:< 3 seconds] [Right:< 3 seconds] Toe Nail Assessment Left: Right: Thick: Yes Yes Discolored: Yes Yes Deformed: Yes Yes Improper Length and Hygiene: Yes Yes Notes unable to obtain ABI due to severe pain in lower legs bilateral with raw blistered skin Electronic Signature(s) Signed: 03/05/2018 4:56:16 PM By: Roger Shelter Entered By: Roger Shelter on 03/05/2018 13:16:21 Cleaver, [name] ([id number]) -------------------------------------------------------------------------------- Multi Wound Chart Details Patient Name: [name] Date of Service: 03/05/2018 12:30 PM Medical Record Number: [id number] Patient Account Number: [id number] Date of Birth/Sex: February 03, 1941 (77 y.o. Female) Treating RN: Montey Hora Primary Care Natassja Ollis: Salome Holmes Other Clinician: Referring Jlee Harkless: Salome Holmes Treating Chelesea Weiand/Extender: Melburn Hake, HOYT Weeks in Treatment: 0 Vital Signs Height(in): 73 Pulse(bpm): 23 Weight(lbs): 143.8 Blood Pressure(mmHg): 148/72 Body Mass Index(BMI): 24 Temperature(F): 98.2 Respiratory Rate 18 (breaths/min): Photos: [1:No Photos] [2:No Photos] [N/A:N/A] Wound Location: [1:Left Lower Leg - Lateral] [2:Left Lower Leg - Lateral] [N/A:N/A] Wounding Event: [1:Blister] [2:Blister] [N/A:N/A] Primary Etiology: [1:Lymphedema] [2:Lymphedema] [N/A:N/A] Comorbid History: [1:Chronic sinus problems/congestion, Anemia, Lymphedema, Angina,  Arrhythmia, Hypertension, Gout, Rheumatoid Arthritis, Osteoarthritis] [2:Chronic sinus problems/congestion, Anemia, Lymphedema, Angina, Arrhythmia, Hypertension, Gout,  Rheumatoid Arthritis, Osteoarthritis] [N/A:N/A] Date Acquired: [1:02/19/2018] [2:02/19/2018] [N/A:N/A] Weeks of Treatment: [1:0] [2:0] [N/A:N/A] Wound Status: [1:Open] [2:Open] [N/A:N/A] Measurements L x W x D [1:11.5x14x0.2] [2:16x15.4x0.2] [N/A:N/A] (cm) Area (cm) : [1:126.449] [2:193.522] [N/A:N/A] Volume (cm) : [1:25.29] [2:38.704] [N/A:N/A] Classification: [1:Full Thickness Without Exposed Support Structures] [2:Full Thickness Without Exposed Support Structures] [N/A:N/A] Exudate Amount: [1:Large] [2:Large] [N/A:N/A] Exudate Type: [1:Serous] [2:Serous] [N/A:N/A] Exudate Color: [1:amber] [2:amber] [N/A:N/A] Foul Odor After Cleansing: [1:Yes] [2:Yes] [N/A:N/A] Odor Anticipated Due to [1:No] [2:No] [N/A:N/A] Product Use: Wound Margin: [1:Indistinct, nonvisible] [2:Indistinct, nonvisible] [N/A:N/A] Granulation Amount: [1:Small (1-33%)] [2:Small (1-33%)] [N/A:N/A] Granulation Quality: [1:Red] [2:Red, Pink] [N/A:N/A] Necrotic Amount: [1:Large (67-100%)] [2:Large (67-100%)] [N/A:N/A] Necrotic Tissue: [1:Eschar, Adherent Slough] [2:Eschar, Adherent Slough] [N/A:N/A] Exposed Structures: [1:Fat Layer (Subcutaneous Tissue) Exposed: Yes Fascia: No Tendon: No Muscle: No Joint: No Bone: No] [2:Fat  Layer (Subcutaneous Tissue) Exposed: Yes Fascia: No Tendon: No Muscle: No Joint: No Bone: No] [N/A:N/A] Epithelialization: Small (1-33%) Small (1-33%) N/A Periwound Skin Texture: Excoriation: Yes Excoriation: Yes N/A Induration: No Induration: No Callus: No Callus: No Crepitus: No Crepitus: No Rash: No Rash: No Scarring: No Scarring: No Periwound Skin Moisture: Maceration: Yes Maceration: Yes N/A Dry/Scaly: No Dry/Scaly: No Periwound Skin Color: Erythema: Yes Atrophie Blanche: No N/A Atrophie Blanche: No Cyanosis:  No Cyanosis: No Ecchymosis: No Ecchymosis: No Erythema: No Hemosiderin Staining: No Hemosiderin Staining: No Mottled: No Mottled: No Pallor: No Pallor: No Rubor: No Rubor: No Erythema Location: Circumferential N/A N/A Temperature: No Abnormality No Abnormality N/A Tenderness on Palpation: Yes Yes N/A Wound Preparation: Ulcer Cleansing: Ulcer Cleansing: N/A Rinsed/Irrigated with Saline Rinsed/Irrigated with Saline Topical Anesthetic Applied: Topical Anesthetic Applied: Other: lidocaine 4% and Other: lidocaine 4% and hurricaine spray hurricaine spray Treatment Notes Electronic Signature(s) Signed: 03/05/2018 5:24:01 PM By: Montey Hora Entered By: Montey Hora on 03/05/2018 13:39:30 Spratt, [name] ([id number]) -------------------------------------------------------------------------------- Multi-Disciplinary Care Plan Details Patient Name: [name] Date of Service: 03/05/2018 12:30 PM Medical Record Number: [id number] Patient Account Number: [id number] Date of Birth/Sex: 1941-07-26 (76 y.o. Female) Treating RN: Montey Hora Primary Care Antwione Picotte: Salome Holmes Other Clinician: Referring Maela Takeda: Salome Holmes Treating Khylie Larmore/Extender: Melburn Hake, HOYT Weeks in Treatment: 0 Active Inactive ` Abuse / Safety / Falls / Self Care Management Nursing Diagnoses: Impaired physical mobility Goals: Patient will not experience any injury related to falls Date Initiated: 03/05/2018 Target Resolution Date: 05/22/2018 Goal Status: Active Interventions: Assess fall risk on admission and as needed Notes: ` Orientation to the Wound Care Program Nursing Diagnoses: Knowledge deficit related to the wound healing center program Goals: Patient/caregiver will verbalize understanding of the [place] Date Initiated: 03/05/2018 Target Resolution Date: 05/22/2018 Goal Status: Active Interventions: Provide education on orientation to the wound  center Notes: ` Wound/Skin Impairment Nursing Diagnoses: Impaired tissue integrity Goals: Ulcer/skin breakdown will heal within 14 weeks Date Initiated: 03/05/2018 Target Resolution Date: 05/22/2018 Goal Status: Active Interventions: ZOOEY, SCHREURS ([id number]) Assess patient/caregiver ability to obtain necessary supplies Assess patient/caregiver ability to perform ulcer/skin care regimen upon admission and as needed Assess ulceration(s) every visit Notes: Electronic Signature(s) Signed: 03/05/2018 5:24:01 PM By: Montey Hora Entered By: Montey Hora on 03/05/2018 13:39:16 Defelice, [name] ([id number]) -------------------------------------------------------------------------------- Pain Assessment Details Patient Name: [name] Date of Service: 03/05/2018 12:30 PM Medical Record Number: [id number] Patient Account Number: [id number] Date of Birth/Sex: 1941-02-21 (77 y.o. Female) Treating RN: Roger Shelter Primary Care Emerald Shor: Salome Holmes Other Clinician: Referring Alesandro Stueve: Salome Holmes Treating Saina Waage/Extender: Melburn Hake, HOYT Weeks in Treatment: 0 Active Problems Location of Pain Severity and Description of Pain Patient Has Paino No Site Locations Pain Management and Medication Current Pain Management: Electronic Signature(s) Signed: 03/05/2018 4:56:16 PM By: Roger Shelter Entered By: Roger Shelter on 03/05/2018 12:42:39 Mikulski, [name] ([id number]) -------------------------------------------------------------------------------- Patient/Caregiver Education Details Patient Name: [name] Date of Service: 03/05/2018 12:30 PM Medical Record Number: [id number] Patient Account Number: [id number] Date of Birth/Gender: 04/25/1941 (76 y.o. Female) Treating RN: Cornell Barman Primary Care Physician: Salome Holmes Other Clinician: Referring Physician: Salome Holmes Treating Physician/Extender: Sharalyn Ink in Treatment: 0 Education  Assessment Education Provided To: Patient Education Topics Provided Wound/Skin Impairment: Handouts: Caring for Your Ulcer Methods: Demonstration Responses: State content correctly Electronic Signature(s) Signed: 03/05/2018 6:21:51 PM By: Gretta Cool, BSN, RN, CWS, Kim RN, BSN Entered By: Gretta Cool, BSN, RN, CWS, Kim on 03/05/2018 14:23:04 Jauregui,  YLIANA GRAVOIS ([id number]) -------------------------------------------------------------------------------- Wound Assessment Details Patient Name: ESMAY, AMSPACHER Date of Service: 03/05/2018 12:30 PM Medical Record Number: [id number] Patient Account Number: [id number] Date of Birth/Sex: [birth year] (77 y.o. Female) Treating RN: Roger Shelter Primary Care Dnyla Antonetti: Salome Holmes Other Clinician: Referring Jaspal Pultz: Salome Holmes Treating Nickalous Stingley/Extender: Melburn Hake, HOYT Weeks in Treatment: 0 Wound Status Wound Number: 1 Primary Lymphedema Etiology: Wound Location: Left Lower Leg - Lateral Wound Open Wounding Event: Blister Status: Date Acquired: 02/19/2018 Comorbid Chronic sinus problems/congestion, Anemia, Weeks Of Treatment: 0 History: Lymphedema, Angina, Arrhythmia, Hypertension, Clustered Wound: No Gout, Rheumatoid Arthritis, Osteoarthritis Photos Photo Uploaded By: Roger Shelter on 03/05/2018 17:18:05 Wound Measurements Length: (cm) 11.5 Width: (cm) 14 Depth: (cm) 0.2 Area: (cm) 126.449 Volume: (cm) 25.29 % Reduction in Area: % Reduction in Volume: Epithelialization: Small (1-33%) Tunneling: No Undermining: No Wound Description Full Thickness Without Exposed Support Classification: Structures Wound Margin: Indistinct, nonvisible Exudate Large Amount: Exudate Type: Serous Exudate Color: amber Foul Odor After Cleansing: Yes Due to Product Use: No Slough/Fibrino Yes Wound Bed Granulation Amount: Small (1-33%) Exposed Structure Granulation Quality: Red Fascia Exposed: No Necrotic Amount: Large (67-100%) Fat Layer  (Subcutaneous Tissue) Exposed: Yes Necrotic Quality: Eschar, Adherent Slough Tendon Exposed: No Muscle Exposed: No Joint Exposed: No Bone Exposed: No Hornaday, Raylinn M. ([id number]) Periwound Skin Texture Texture Color No Abnormalities Noted: No No Abnormalities Noted: No Callus: No Atrophie Blanche: No Crepitus: No Cyanosis: No Excoriation: Yes Ecchymosis: No Induration: No Erythema: Yes Rash: No Erythema Location: Circumferential Scarring: No Hemosiderin Staining: No Mottled: No Moisture Pallor: No No Abnormalities Noted: No Rubor: No Dry / Scaly: No Maceration: Yes Temperature / Pain Temperature: No Abnormality Tenderness on Palpation: Yes Wound Preparation Ulcer Cleansing: Rinsed/Irrigated with Saline Topical Anesthetic Applied: Other: lidocaine 4% and hurricaine spray, Electronic Signature(s) Signed: 03/05/2018 4:56:16 PM By: Roger Shelter Entered By: Roger Shelter on 03/05/2018 13:09:09 Boffa, [name] ([id number]) -------------------------------------------------------------------------------- Wound Assessment Details Patient Name: [name] Date of Service: 03/05/2018 12:30 PM Medical Record Number: [id number] Patient Account Number: [id number] Date of Birth/Sex: 02/25/1941 (77 y.o. Female) Treating RN: Roger Shelter Primary Care Iam Lipson: Salome Holmes Other Clinician: Referring Kaitlyne Friedhoff: Salome Holmes Treating Elery Cadenhead/Extender: Melburn Hake, HOYT Weeks in Treatment: 0 Wound Status Wound Number: 2 Primary Lymphedema Etiology: Wound Location: Left Lower Leg - Lateral Wound Open Wounding Event: Blister Status: Date Acquired: 02/19/2018 Comorbid Chronic sinus problems/congestion, Anemia, Weeks Of Treatment: 0 History: Lymphedema, Angina, Arrhythmia, Hypertension, Clustered Wound: No Gout, Rheumatoid Arthritis, Osteoarthritis Photos Photo Uploaded By: Roger Shelter on 03/05/2018 17:19:48 Wound Measurements Length: (cm) 16 Width: (cm)  15.4 Depth: (cm) 0.2 Area: (cm) 193.522 Volume: (cm) 38.704 % Reduction in Area: % Reduction in Volume: Epithelialization: Small (1-33%) Tunneling: No Undermining: No Wound Description Full Thickness Without Exposed Support Classification: Structures Wound Margin: Indistinct, nonvisible Exudate Large Amount: Exudate Type: Serous Exudate Color: amber Foul Odor After Cleansing: Yes Due to Product Use: No Slough/Fibrino Yes Wound Bed Granulation Amount: Small (1-33%) Exposed Structure Granulation Quality: Red, Pink Fascia Exposed: No Necrotic Amount: Large (67-100%) Fat Layer (Subcutaneous Tissue) Exposed: Yes Necrotic Quality: Eschar, Adherent Slough Tendon Exposed: No Muscle Exposed: No Joint Exposed: No Bone Exposed: No Smelser, Lundynn M. ([id number]) Periwound Skin Texture Texture Color No Abnormalities Noted: No No Abnormalities Noted: No Callus: No Atrophie Blanche: No Crepitus: No Cyanosis: No Excoriation: Yes Ecchymosis: No Induration: No Erythema: No Rash: No Hemosiderin Staining: No Scarring: No Mottled: No Pallor: No Moisture Rubor: No No Abnormalities Noted: No Dry / Scaly: No Temperature /  Pain Maceration: Yes Temperature: No Abnormality Tenderness on Palpation: Yes Wound Preparation Ulcer Cleansing: Rinsed/Irrigated with Saline Topical Anesthetic Applied: Other: lidocaine 4% and hurricaine spray, Treatment Notes Wound #2 (Left, Lateral Lower Leg) 1. Cleansed with: Clean wound with Normal Saline 2. Anesthetic Topical Lidocaine 4% cream to wound bed prior to debridement 4. Dressing Applied: Aquacel Notes Kerramax, carboflex, kerlix and coban Electronic Signature(s) Signed: 03/05/2018 4:56:16 PM By: Roger Shelter Entered By: Roger Shelter on 03/05/2018 13:12:03 Halbur, [name] ([id number]) -------------------------------------------------------------------------------- Vitals Details Patient Name: [name] Date of  Service: 03/05/2018 12:30 PM Medical Record Number: [id number] Patient Account Number: [id number] Date of Birth/Sex: [birth year] (76 y.o. Female) Treating RN: Roger Shelter Primary Care Jacolyn Joaquin: Salome Holmes Other Clinician: Referring Arvie Villarruel: Salome Holmes Treating Juno Bozard/Extender: Melburn Hake, HOYT Weeks in Treatment: 0 Vital Signs Time Taken: 12:43 Temperature (F): 98.2 Height (in): 65 Pulse (bpm): 62 Source: Stated Respiratory Rate (breaths/min): 18 Weight (lbs): 143.8 Blood Pressure (mmHg): 148/72 Source: Measured Reference Range: 80 - 120 mg / dl Body Mass Index (BMI): 23.9 Electronic Signature(s) Signed: 03/05/2018 4:56:16 PM By: Roger Shelter Entered By: Roger Shelter on 03/05/2018 12:43:38

## 2018-03-07 NOTE — Progress Notes (Signed)
[name] ([id number]) Visit Report for 03/05/2018 Biopsy Details Patient Name: [name], [name] Date of Service: 03/05/2018 12:30 PM Medical Record Number: [id number] Patient Account Number: [id number] Date of Birth/Sex: 09/01/1941 (77 y.o. Female) Treating RN: [name] Primary Care Provider: Salome [name] Other Clinician: Referring Provider: Salome [name] Treating Provider/Extender: [name], [name] Biopsy Performed for: Wound #2 Left, Lateral Lower Leg Location(s): Wound Margin Performed By: Physician [name], [name] Tissue Punch: Yes Size (mm): 5 Number of Specimens Taken: 1 Specimen Sent To Pathology: Yes Pre-procedure Verification/Time-Out Taken: Yes - 13:50 Pain Control: Other Other Description: xilocaine % injectable Instrument: Forceps, Other: punch Bleeding: Moderate Hemostasis Achieved: Pressure Procedural Pain: [name] Post Procedural Pain: [name] Response to Treatment: Procedure was tolerated well Level of Consciousness: Awake and Alert Post Procedure Vitals: Temperature: 98.2 Pulse: 62 Respiratory Rate: 18 Blood Pressure: Systolic Blood Pressure: 846 Diastolic Blood Pressure: 72 Post Procedure Diagnosis Same as Pre-procedure Electronic Signature(s) Signed: 03/05/2018 5:24:01 PM By: [name] Signed: 03/05/2018 5:25:00 PM By: Kristina Keeler [name] Entered By: [name] on 03/05/2018 13:59:15 [name], [name] ([id number]) -------------------------------------------------------------------------------- Chief Complaint Document Details Patient Name: [name] Date of Service: 03/05/2018 12:30 PM Medical Record Number: [id number] Patient Account Number: [id number] Date of Birth/Sex: 04/11/1941 (77 y.o. Female) Treating RN: [name] Primary Care Provider: Salome [name] Other Clinician: Referring Provider: Salome [name] Treating Provider/Extender: [name], [name] Weeks in Treatment: [name] Information Obtained from:  Patient Chief Complaint Bilateral LE Blisters/Ulcers Electronic Signature(s) Signed: 03/05/2018 5:25:00 PM By: Kristina Keeler [name] Entered By: [name] on 03/05/2018 17:17:13 [name], [name] ([id number]) -------------------------------------------------------------------------------- Debridement Details Patient Name: [name] Date of Service: 03/05/2018 12:30 PM Medical Record Number: [id number] Patient Account Number: [id number] Date of Birth/Sex: Mar 10, 1941 (77 y.o. Female) Treating RN: [name] Primary Care Provider: Salome [name] Other Clinician: Referring Provider: Salome [name] Treating Provider/Extender: [name], [name] Debridement Performed for Wound #1 Right,Lateral Lower Leg Assessment: Performed By: Physician [name], [name] E., [name] Debridement Type: Debridement Pre-procedure Verification/Time Yes - 13:40 Out Taken: Start Time: 13:40 Pain Control: Lidocaine 4% Topical Solution Total Area Debrided (L x W): 11.5 (cm) x 14 (cm) = 161 (cm) Tissue and other material Non-Viable, Slough, Skin: Dermis , Skin: Epidermis, Slough, Other: drainage debrided: Level: Skin/Epidermis Debridement Description: Selective/Open Wound Instrument: Curette, Forceps, Scissors Bleeding: None End Time: 13:47 Procedural Pain: [name] Post Procedural Pain: [name] Response to Treatment: Procedure was tolerated well Level of Consciousness: Awake and Alert Post Procedure Vitals: Temperature: 98.2 Pulse: 62 Respiratory Rate: 18 Blood Pressure: Systolic Blood Pressure: 875 Diastolic Blood Pressure: 72 Post Debridement Measurements of Total Wound Length: (cm) 11.5 Width: (cm) 14 Depth: (cm) [name].1 Volume: (cm) 12.645 Character of Wound/Ulcer Post Debridement: Improved Post Procedure Diagnosis Same as Pre-procedure Electronic Signature(s) Signed: 03/05/2018 5:24:01 PM By: [name] Signed: 03/05/2018 5:25:00 PM By: Kristina Keeler [name] Entered By:  [name] on 03/05/2018 13:48:32 Yamada, [name] ([id number]) -------------------------------------------------------------------------------- HPI Details Patient Name: [name] Date of Service: 03/05/2018 12:30 PM Medical Record Number: [id number] Patient Account Number: [id number] Date of Birth/Sex: 09/22/1941 (77 y.o. Female) Treating RN: [name] Primary Care Provider: Salome [name] Other Clinician: Referring Provider: Salome [name] Treating Provider/Extender: [name], Katalin Colledge Weeks in Treatment: [name] History of Present Illness HPI Description: 03/05/18 on evaluation today patient presents for initial evaluation and our clinic due to significantly large blisters of the bilateral lower extremities which began initially on  02/18/18. 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 Secondarily it does appear that they felt she likely had cellulitis. Subsequently the patient was seen for fault evaluation on 03/03/18 and that is really when the cellulitis was diagnosed. She was actually placed on doxycycline 100 mg by mouth twice a day for 10 days. With that being said she was prescribed prednisone although she did not tolerate this. Apparently she had some alteration in her mental status with taking this. 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 She has never smoked and unfortunately does have pain at the site where these blisters are located of the bilateral lower extremities. She also does appear to have significant lymphedema which obviously has been more chronic. Electronic Signature(s) Signed: 03/05/2018 5:25:00 PM By: Kristina Keeler [name] Entered By: [name] on 03/05/2018 17:19:19 [name], [name] ([id number]) -------------------------------------------------------------------------------- Physical Exam  Details Patient Name: [name] Date of Service: 03/05/2018 12:30 PM Medical Record Number: [id number] Patient Account Number: [id number] Date of Birth/Sex: 05/13/1941 (77 y.o. Female) Treating RN: [name] Primary Care Provider: Salome [name] Other Clinician: Referring Provider: Salome [name] Treating Provider/Extender: [name], Opie Fanton Weeks in Treatment: [name] Constitutional patient is hypertensive.. pulse regular and within target range for patient.Marland Kitchen respirations regular, non-labored and within target range for patient.Marland Kitchen temperature within target range for patient.. Well-nourished and well-hydrated in no acute distress. Eyes conjunctiva clear no eyelid edema noted. pupils equal round and reactive to light and accommodation. Ears, Nose, Mouth, and Throat no gross abnormality of ear auricles or external auditory canals. normal hearing noted during conversation. mucus membranes moist. Respiratory normal breathing without difficulty. clear to auscultation bilaterally. Cardiovascular regular rate and rhythm with normal S1, S2. 1+ dorsalis pedis/posterior tibialis pulses. 2+ pitting edema of the bilateral lower extremities. Gastrointestinal (GI) soft, non-tender, non-distended, +BS. no ventral hernia noted. Musculoskeletal Patient unable to walk without assistance. Psychiatric this patient is able to make decisions and demonstrates good insight into disease process. Alert and Oriented x 3. pleasant and cooperative. Notes On inspection today the patient's ulcers over the bilateral lower extremities actually appear to have a very uncharacteristic appearance at this point. Again I do not see that there has been any biopsy performed in order to confirm bullous pemphigoid at this point. I think that would be a good idea, that is to perform a biopsy today, due to the overall unusual appearance to her wound. This was actually performed over the left lower extremity laterally today. I  took a sample at the wound margin at this point. A wound culture was also performed of the left lower extremity. Subsequently I did also selectively debride away tissue today that was mainly necrotic skin over the surface of the wound. Electronic Signature(s) Signed: 03/05/2018 5:25:00 PM By: Kristina Keeler [name] Entered By: [name] on 03/05/2018 17:21:05 [name], [name] ([id number]) -------------------------------------------------------------------------------- Physician Orders Details Patient Name: [name] Date of Service: 03/05/2018 12:30 PM Medical Record Number: [id number] Patient Account Number: [id number] Date of Birth/Sex: [birth year] (77 y.o. Female) Treating RN: [name] Primary Care Provider: Salome [name] Other Clinician: Referring Provider: Salome [name] Treating Provider/Extender: [name], Quinterius Gaida Weeks in Treatment: [name] Verbal / Phone Orders: No Diagnosis Coding Wound Cleansing Wound #1 Right,Lateral Lower Leg o Clean wound with Normal Saline. o May shower with protection. Wound #2 Left,Lateral  Lower Leg o Clean wound with Normal Saline. o May shower with protection. Anesthetic (add to Medication List) Wound #1 Right,Lateral Lower Leg o Injected 2% Lidocaine without epinephrine prior to debridement (In Clinic Only). o Benzocaine Topical Anesthetic Spray applied to wound bed prior to debridement (In Clinic Only). Wound #2 Left,Lateral Lower Leg o Injected 2% Lidocaine without epinephrine prior to debridement (In Clinic Only). o Benzocaine Topical Anesthetic Spray applied to wound bed prior to debridement (In Clinic Only). Primary Wound Dressing Wound #1 Right,Lateral Lower Leg o Silver Alginate Wound #2 Left,Lateral Lower Leg o Silver Alginate Secondary Dressing Wound #1 Right,Lateral Lower Leg o ABD pad o XtraSorb Wound #2 Left,Lateral Lower Leg o ABD pad o XtraSorb Dressing Change Frequency Wound #1  Right,Lateral Lower Leg o Change Dressing Monday, Wednesday, Friday Wound #2 Left,Lateral Lower Leg o Change Dressing Monday, Wednesday, Friday Follow-up Appointments Wound #1 Right,Lateral Lower Leg [name], Kristina M. ([id number]) o Return Appointment in 1 week. Wound #2 Left,Lateral Lower Leg o Return Appointment in 1 week. Edema Control Wound #1 Right,Lateral Lower Leg o Kerlix and Coban - Bilateral - may anchor with unna if needed Wound #2 Left,Lateral Lower Leg o Kerlix and Coban - Bilateral - may anchor with unna if needed Home Health Wound #1 [place] for [place] Nurse may visit PRN to address patientos wound care needs. o FACE TO FACE ENCOUNTER: MEDICARE and MEDICAID PATIENTS: I certify that this patient is under my care and that I had a face-to-face encounter that meets the physician face-to-face encounter requirements with this patient on this date. The encounter with the patient was in whole or in part for the following MEDICAL CONDITION: (primary reason for [place]) MEDICAL NECESSITY: I certify, that based on my findings, NURSING services are a medically necessary home health service. HOME BOUND STATUS: I certify that my clinical findings support that this patient is homebound (i.e., Due to illness or injury, pt requires aid of supportive devices such as crutches, cane, wheelchairs, walkers, the use of special transportation or the assistance of another person to leave their place of residence. There is a normal inability to leave the home and doing so requires considerable and taxing effort. Other absences are for medical reasons / religious services and are infrequent or of short duration when for other reasons). o If current dressing causes regression in wound condition, may D/C ordered dressing product/s and apply Normal Saline Moist Dressing daily until next [place] / Other  MD appointment. [place] of regression in wound condition at [phone]. o Please direct any NON-WOUND related issues/requests for orders to patient's Primary Care Physician Wound #2 [place] for [place] Nurse may visit PRN to address patientos wound care needs. o FACE TO FACE ENCOUNTER: MEDICARE and MEDICAID PATIENTS: I certify that this patient is under my care and that I had a face-to-face encounter that meets the physician face-to-face encounter requirements with this patient on this date. The encounter with the patient was in whole or in part for the following MEDICAL CONDITION: (primary reason for [place]) MEDICAL NECESSITY: I certify, that based on my findings, NURSING services are a medically necessary home health service. HOME BOUND STATUS: I certify that my clinical findings support that this patient is homebound (i.e., Due to illness or injury, pt requires aid of supportive devices such as crutches, cane, wheelchairs, walkers, the use of  special transportation or the assistance of another person to leave their place of residence. There is a normal inability to leave the home and doing so requires considerable and taxing effort. Other absences are for medical reasons / religious services and are infrequent or of short duration when for other reasons). o If current dressing causes regression in wound condition, may D/C ordered dressing product/s and apply Normal Saline Moist Dressing daily until next [place] / Other MD appointment. [place] of regression in wound condition at [phone]. o Please direct any NON-WOUND related issues/requests for orders to patient's Primary Care Physician Laboratory o Bacteria identified in Wound by Culture (MICRO) oooo LOINC Code: [phone] oooo Convenience Name: Wound culture routine [name], [name] ([id number]) o  Tissue Pathology biopsy report (PATH) oooo LOINC Code: [id number] Convenience Name: [facility] Path Bx report Electronic Signature(s) Signed: 03/05/2018 5:24:01 PM By: [name] Signed: 03/05/2018 5:25:00 PM By: Kristina Keeler [name] Entered By: [name] on 03/05/2018 14:03:18 [place], [name] ([id number]) -------------------------------------------------------------------------------- Problem List Details Patient Name: [name] Date of Service: 03/05/2018 12:30 PM Medical Record Number: [id number] Patient Account Number: [id number] Date of Birth/Sex: 08-14-1941 (77 y.o. 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 Female) Treating RN: [name] Primary Care Provider: Salome [name] Other Clinician: Referring Provider: Salome [name] Treating Provider/Extender: [name], Jhony Antrim Weeks in Treatment: [name] Subjective Chief  Complaint Information obtained from Patient Bilateral LE Blisters/Ulcers History of Present Illness (HPI) 03/05/18 on evaluation today patient presents for initial evaluation and our clinic due to significantly large blisters of the bilateral lower extremities which began initially on 02/18/18. 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 Secondarily it does appear that they felt she likely had cellulitis. Subsequently the patient was seen for fault evaluation on 03/03/18 and that is really when the cellulitis was diagnosed. She was actually placed on doxycycline 100 mg by mouth twice a day for 10 days. With that being said she was prescribed prednisone although she did not tolerate this. Apparently she had some alteration in her mental status with taking this. 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 She has never smoked and unfortunately does have pain at the site where these blisters are located of the bilateral lower extremities. She also does appear to have significant lymphedema which obviously has been more chronic. Wound History Patient presents with 2 open wounds that have been present for approximately 2 weeks. Patient has been treating wounds in the following manner: gauze. Laboratory tests have been performed in the last month.  Patient reportedly has not tested positive for an antibiotic resistant organism. Patient reportedly has not tested positive for osteomyelitis. Patient reportedly has not had testing performed to evaluate circulation in the legs. Patient experiences the following problems associated with their wounds: infection. Patient History Allergies no known allergies Family History Cancer - Mother, Diabetes - Siblings, Hypertension - Siblings, Lung Disease - Siblings, No family history of Heart Disease, Hereditary  Spherocytosis, Kidney Disease, Seizures, Stroke, Thyroid Problems, Tuberculosis. Social History Never smoker, Marital Status - Widowed, Alcohol Use - Never, Drug Use - No History, Caffeine Use - Never. Medical History Eyes Denies history of Cataracts, Glaucoma, Optic Neuritis Ear/Nose/Mouth/Throat Patient has history of Chronic sinus problems/congestion - allergys seasonal Denies history of Middle ear problems Hematologic/Lymphatic Patient has history of Anemia, Lymphedema Denies history of Hemophilia, Human Immunodeficiency Virus, Sickle Cell Disease Respiratory Anes, [place] M. ([id number]) Denies history of Aspiration, Asthma, Chronic Obstructive Pulmonary Disease (COPD), Pneumothorax, Sleep Apnea, Tuberculosis Cardiovascular Patient has history of Angina, Arrhythmia - murmur, Hypertension Denies history of Congestive Heart Failure, Coronary Artery Disease, Deep Vein Thrombosis, Hypotension, Myocardial Infarction, Peripheral Arterial Disease, Peripheral Venous Disease, Phlebitis, Vasculitis Gastrointestinal Denies history of Cirrhosis , Colitis, Crohn s, Hepatitis A, Hepatitis B, Hepatitis C Endocrine Denies history of Type I Diabetes, Type II Diabetes Genitourinary Denies history of End Stage Renal Disease Immunological Denies history of Lupus Erythematosus, Raynaud s, Scleroderma Integumentary (Skin) Denies history of History of Burn, History of pressure wounds Musculoskeletal Patient has history of Gout, Rheumatoid Arthritis, Osteoarthritis Denies history of Osteomyelitis Neurologic Denies history of Dementia, Neuropathy, Quadriplegia, Paraplegia, Seizure Disorder Oncologic Denies history of Received Chemotherapy, Received Radiation Psychiatric Denies history of Anorexia/bulimia, Confinement Anxiety Review of Systems (ROS) Constitutional Symptoms (General Health) Denies complaints or symptoms of Fatigue, Fever, Chills, Marked Weight Change. Eyes Complains or has  symptoms of Dry Eyes, Glasses / Contacts - glasses. Denies complaints or symptoms of Vision Changes. Ear/Nose/Mouth/Throat Denies complaints or symptoms of Difficult clearing ears, Sinusitis. Hematologic/Lymphatic Denies complaints or symptoms of Bleeding / Clotting Disorders, Human Immunodeficiency Virus. Respiratory Complains or has symptoms of Shortness of Breath - with exertion. Cardiovascular Complains or has symptoms of LE edema. Denies complaints or symptoms of Chest pain. Gastrointestinal Denies complaints or symptoms of Frequent diarrhea, Nausea, Vomiting. Endocrine Complains or has symptoms of Thyroid disease. Denies complaints or symptoms of Hepatitis, Polydypsia (Excessive Thirst). Genitourinary Complains or has symptoms of Incontinence/dribbling - incontinence. Denies complaints or symptoms of Kidney failure/ Dialysis. Immunological Denies complaints or symptoms of Hives, Itching. Integumentary (Skin) Complains or has symptoms of Wounds, Bleeding or bruising tendency, Swelling. Denies complaints or symptoms of Breakdown. Musculoskeletal Denies complaints or symptoms of Muscle Pain, Muscle Weakness. Neurologic Complains or has symptoms of Numbness/parasthesias - hands and feet. Denies complaints or symptoms of Focal/Weakness. Oncologic [name], Kristina ENO. ([id number]) The patient has no complaints or symptoms, leukemia Psychiatric Denies complaints or symptoms of Anxiety, Claustrophobia. Objective Constitutional patient is hypertensive.. pulse regular and within target range for patient.Marland Kitchen respirations regular, non-labored and within target range for patient.Marland Kitchen temperature within target range for patient.. Well-nourished and well-hydrated in no acute distress. Vitals Time Taken: 12:43 PM, Height: 65 in, Source: Stated, Weight: 143.8 lbs, Source: Measured, BMI: 23.9, Temperature: 98.2 F, Pulse: 62 bpm, Respiratory Rate: 18 breaths/min, Blood Pressure: 148/72  mmHg. Eyes conjunctiva clear no eyelid edema noted. pupils equal round and reactive to light and accommodation. Ears, Nose, Mouth, and Throat no gross abnormality of ear auricles or external auditory canals. normal hearing noted during conversation.  mucus membranes moist. Respiratory normal breathing without difficulty. clear to auscultation bilaterally. Cardiovascular regular rate and rhythm with normal S1, S2. 1+ dorsalis pedis/posterior tibialis pulses. 2+ pitting edema of the bilateral lower extremities. Gastrointestinal (GI) soft, non-tender, non-distended, +BS. no ventral hernia noted. Musculoskeletal Patient unable to walk without assistance. Psychiatric this patient is able to make decisions and demonstrates good insight into disease process. Alert and Oriented x 3. pleasant and cooperative. General Notes: On inspection today the patient's ulcers over the bilateral lower extremities actually appear to have a very uncharacteristic appearance at this point. Again I do not see that there has been any biopsy performed in order to confirm bullous pemphigoid at this point. I think that would be a good idea, that is to perform a biopsy today, due to the overall unusual appearance to her wound. This was actually performed over the left lower extremity laterally today. I took a sample at the wound margin at this point. A wound culture was also performed of the left lower extremity. Subsequently I did also selectively debride away tissue today that was mainly necrotic skin over the surface of the wound. Integumentary (Hair, Skin) Wound #1 status is Open. Original cause of wound was Blister. The wound is located on the Right,Lateral Lower Leg. The wound measures 11.5cm length x 14cm width x [name].2cm depth; 126.449cm^2 area and 25.29cm^3 volume. There is Fat Layer (Subcutaneous Tissue) Exposed exposed. There is no tunneling or undermining noted. There is a large amount of serous drainage noted.  Foul odor after cleansing was noted. The wound margin is indistinct and nonvisible. There is small (1-33%) [place], Kristina M. ([id number]) red granulation within the wound bed. There is a large (67-100%) amount of necrotic tissue within the wound bed including Eschar and Adherent Slough. The periwound skin appearance exhibited: Excoriation, Maceration, Erythema. The periwound skin appearance did not exhibit: Callus, Crepitus, Induration, Rash, Scarring, Dry/Scaly, Atrophie Blanche, Cyanosis, Ecchymosis, Hemosiderin Staining, Mottled, Pallor, Rubor. The surrounding wound skin color is noted with erythema which is circumferential. Periwound temperature was noted as No Abnormality. The periwound has tenderness on palpation. Wound #2 status is Open. Original cause of wound was Blister. The wound is located on the Left,Lateral Lower Leg. The wound measures 16cm length x 15.4cm width x [name].2cm depth; 193.522cm^2 area and 38.704cm^3 volume. There is Fat Layer (Subcutaneous Tissue) Exposed exposed. There is no tunneling or undermining noted. There is a large amount of serous drainage noted. Foul odor after cleansing was noted. The wound margin is indistinct and nonvisible. There is small (1-33%) red, pink granulation within the wound bed. There is a large (67-100%) amount of necrotic tissue within the wound bed including Eschar and Adherent Slough. The periwound skin appearance exhibited: Excoriation, Maceration. The periwound skin appearance did not exhibit: Callus, Crepitus, Induration, Rash, Scarring, Dry/Scaly, Atrophie Blanche, Cyanosis, Ecchymosis, Hemosiderin Staining, Mottled, Pallor, Rubor, Erythema. Periwound temperature was noted as No Abnormality. The periwound has tenderness on palpation. 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 There was a Selective/Open Wound Skin/Epidermis Debridement with a total area of 161 sq cm performed by [name], Ankith Edmonston E., [name]. With the following instrument(s): Curette,  Forceps, and Scissors to remove Non-Viable tissue/material. Material removed includes Slough, Skin: Dermis, Skin: Epidermis, and Other: drainage after achieving pain control using Lidocaine 4% Topical Solution. No specimens were taken. A time out was conducted at 13:40, prior to the start of the procedure. There was no bleeding. The procedure was tolerated well with a pain level of [name] throughout and a pain level of [name] following the procedure. Patient s Level of Consciousness post procedure was recorded as Awake and Alert and post-procedure vitals were taken including Temperature: 98.2 F, Pulse: 62 bpm, Respiratory Rate: 18 breaths/min, Blood Pressure: (148)/(72) mmHg. Post Debridement Measurements: 11.5cm length x 14cm width x [name].1cm depth; 12.645cm^3 volume. Character of Wound/Ulcer Post Debridement is improved. Post procedure Diagnosis Wound #1: Same as Pre-Procedure Wound #2 Pre-procedure diagnosis of Wound #2 is a Lymphedema located on the Left, Lateral Lower Leg . There was a biopsy performed by [name], Ritesh Opara E., [name]. There was a biopsy performed on Wound Margin. The skin was cleansed and prepped with anti-septic followed by pain control using Other. Utilizing a 5 mm tissue punch, tissue was removed at its base with the following instrument(s): Forceps and Other and sent to pathology. A Moderate amount of bleeding was controlled with Pressure. A time out was conducted at 13:50, prior to the start of the procedure. The procedure was tolerated well with a pain level of [name] throughout and a pain level of [name]  following the procedure. Patient s Level of Consciousness post procedure was recorded as Awake and Alert and post-procedure vitals were taken including Temperature: 98.2 F, Pulse: 62 bpm, [name], Sharin M. ([id number]) Respiratory Rate: 18 breaths/min, Blood Pressure: (148)/(72) mmHg. Post procedure Diagnosis Wound #2: Same as Pre-Procedure Plan Wound Cleansing: Wound #1 Right,Lateral Lower Leg: Clean wound with Normal Saline. May shower with protection. Wound #2 Left,Lateral Lower Leg: Clean wound with Normal Saline. May shower with protection. Anesthetic (add to Medication List): Wound #1 Right,Lateral Lower Leg: Injected 2% Lidocaine without epinephrine prior to debridement (In Clinic Only). Benzocaine Topical Anesthetic Spray applied to wound bed prior to debridement (In Clinic Only). Wound #2 Left,Lateral Lower Leg: Injected 2% Lidocaine without epinephrine prior to debridement (In Clinic Only). Benzocaine Topical Anesthetic Spray applied to wound bed prior to debridement (In Clinic Only). Primary Wound Dressing: Wound #1 Right,Lateral Lower Leg: Silver Alginate Wound #2 Left,Lateral Lower Leg: Silver Alginate Secondary Dressing: Wound #1 Right,Lateral Lower Leg: ABD pad XtraSorb Wound #2 Left,Lateral Lower Leg: ABD pad XtraSorb Dressing Change Frequency: Wound #1 Right,Lateral Lower Leg: Change Dressing Monday, Wednesday, Friday Wound #2 Left,Lateral Lower Leg: Change Dressing Monday, Wednesday, Friday Follow-up Appointments: Wound #1 Right,Lateral Lower Leg: Return Appointment in 1 week. Wound #2 Left,Lateral Lower Leg: Return Appointment in 1 week. Edema Control: Wound #1 Right,Lateral Lower Leg: Kerlix and Coban - Bilateral - may anchor with unna if needed Wound #2 Left,Lateral Lower Leg: Kerlix and Coban - Bilateral - may anchor with unna if needed Home Health: Wound #1 Right,Lateral Lower Leg: [facility] for [place] Nurse may  visit PRN to address patient s wound care needs. FACE TO FACE ENCOUNTER: MEDICARE and MEDICAID PATIENTS: I certify that this patient is under my care and that I had a face-to-face encounter that meets the physician face-to-face encounter requirements with this patient on this date. The encounter with the patient was in whole or in part for the following MEDICAL CONDITION: (primary reason for [place]) MEDICAL NECESSITY: I certify, that based on my  findings, NURSING services are a medically necessary home ADIVA, BOETTNER. ([id number]) health service. HOME BOUND STATUS: I certify that my clinical findings support that this patient is homebound (i.e., Due to illness or injury, pt requires aid of supportive devices such as crutches, cane, wheelchairs, walkers, the use of special transportation or the assistance of another person to leave their place of residence. There is a normal inability to leave the home and doing so requires considerable and taxing effort. Other absences are for medical reasons / religious services and are infrequent or of short duration when for other reasons). If current dressing causes regression in wound condition, may D/C ordered dressing product/s and apply Normal Saline Moist Dressing daily until next [place] / Other MD appointment. [place] of regression in wound condition at [phone]. Please direct any NON-WOUND related issues/requests for orders to patient's Primary Care Physician Wound #2 Left,Lateral Lower Leg: [place] for [facility] Nurse may visit PRN to address patient s wound care needs. FACE TO FACE ENCOUNTER: MEDICARE and MEDICAID PATIENTS: I certify that this patient is under my care and that I had a face-to-face encounter that meets the physician face-to-face encounter requirements with this patient on this date. The encounter with the patient was in whole or in part for the following  MEDICAL CONDITION: (primary reason for [place]) MEDICAL NECESSITY: I certify, that based on my findings, NURSING services are a medically necessary home health service. HOME BOUND STATUS: I certify that my clinical findings support that this patient is homebound (i.e., Due to illness or injury, pt requires aid of supportive devices such as crutches, cane, wheelchairs, walkers, the use of special transportation or the assistance of another person to leave their place of residence. There is a normal inability to leave the home and doing so requires considerable and taxing effort. Other absences are for medical reasons / religious services and are infrequent or of short duration when for other reasons). If current dressing causes regression in wound condition, may D/C ordered dressing product/s and apply Normal Saline Moist Dressing daily until next R[place] / Other MD appointment. [place] of regression in wound condition at [phone]. Please direct any NON-WOUND related issues/requests for orders to patient's Primary Care Physician Laboratory ordered were: Wound culture routine, [facility] Bx report I'm going to suggest currently that we go ahead and initiate treatment with a silver alginate dressing for the next week. We will see how things do. Again these wounds actually have a very uncharacteristic appearance of all and I think that a big portion of the slough which is really thickly adhered to the surface of the wound may need to come off before everything is going to heal appropriately. We did not perform ABI's today due to location wounds and pain obviously. She does seem to have decent pulses and her feet are warm without any evidence of obvious significant arterial insufficiency. We will see what the biopsy shows and I want in general see her for reevaluation in one weeks time we will set up home health come out and change her dressings. Please see  above for specific wound care orders. We will see patient for re-evaluation in 1 week(s) here in the clinic. If anything worsens or changes patient will contact our office for additional recommendations. Electronic Signature(s) Signed: 03/05/2018 5:25:00 PM By: Kristina Keeler [name] Entered By: [name] on 03/05/2018 17:22:32 Velador, [name] ([id number]) --------------------------------------------------------------------------------  ROS/PFSH Details Patient Name: TINESHA, SIEGRIST Date of Service: 03/05/2018 12:30 PM Medical Record Number: [id number] Patient Account Number: [id number] Date of Birth/Sex: 11-21-1940 (77 y.o. Female) Treating RN: [name] Primary Care Provider: Salome [name] Other Clinician: Referring Provider: Salome [name] Treating Provider/Extender: [name], Khyra Viscuso Weeks in Treatment: [name] Wound History Do you currently have one or more open woundso Yes How many open wounds do you currently haveo 2 Approximately how long have you had your woundso 2 weeks How have you been treating your wound(s) until nowo gauze Has your wound(s) ever healed and then re-openedo No Have you had any lab work done in the past montho Yes Have you tested positive for an antibiotic resistant organism (MRSA, VRE)o No Have you tested positive for osteomyelitis (bone infection)o No Have you had any tests for circulation on your legso No Have you had other problems associated with your woundso Infection Constitutional Symptoms (General Health) Complaints and Symptoms: Negative for: Fatigue; Fever; Chills; Marked Weight Change Eyes Complaints and Symptoms: Positive for: Dry Eyes; Glasses / Contacts - glasses Negative for: Vision Changes Medical History: Negative for: Cataracts; Glaucoma; Optic Neuritis Ear/Nose/Mouth/Throat Complaints and Symptoms: Negative for: Difficult clearing ears; Sinusitis Medical History: Positive for: Chronic sinus problems/congestion - allergys  seasonal Negative for: Middle ear problems Hematologic/Lymphatic Complaints and Symptoms: Negative for: Bleeding / Clotting Disorders; Human Immunodeficiency Virus Medical History: Positive for: Anemia; Lymphedema Negative for: Hemophilia; Human Immunodeficiency Virus; Sickle Cell Disease Respiratory Complaints and Symptoms: Positive for: Shortness of Breath - with exertion Medical HistoryFARRAH, SKODA. ([id number]) Negative for: Aspiration; Asthma; Chronic Obstructive Pulmonary Disease (COPD); Pneumothorax; Sleep Apnea; Tuberculosis Cardiovascular Complaints and Symptoms: Positive for: LE edema Negative for: Chest pain Medical History: Positive for: Angina; Arrhythmia - murmur; Hypertension Negative for: Congestive Heart Failure; Coronary Artery Disease; Deep Vein Thrombosis; Hypotension; Myocardial Infarction; Peripheral Arterial Disease; Peripheral Venous Disease; Phlebitis; Vasculitis Gastrointestinal Complaints and Symptoms: Negative for: Frequent diarrhea; Nausea; Vomiting Medical History: Negative for: Cirrhosis ; Colitis; Crohnos; Hepatitis A; Hepatitis B; Hepatitis C Endocrine Complaints and Symptoms: Positive for: Thyroid disease Negative for: Hepatitis; Polydypsia (Excessive Thirst) Medical History: Negative for: Type I Diabetes; Type II Diabetes Genitourinary Complaints and Symptoms: Positive for: Incontinence/dribbling - incontinence Negative for: Kidney failure/ Dialysis Medical History: Negative for: End Stage Renal Disease Immunological Complaints and Symptoms: Negative for: Hives; Itching Medical History: Negative for: Lupus Erythematosus; Raynaudos; Scleroderma Integumentary (Skin) Complaints and Symptoms: Positive for: Wounds; Bleeding or bruising tendency; Swelling Negative for: Breakdown Medical History: Negative for: History of Burn; History of pressure wounds Musculoskeletal [name], Kristina M. ([id number]) Complaints and Symptoms: Negative  for: Muscle Pain; Muscle Weakness Medical History: Positive for: Gout; Rheumatoid Arthritis; Osteoarthritis Negative for: Osteomyelitis Neurologic Complaints and Symptoms: Positive for: Numbness/parasthesias - hands and feet Negative for: Focal/Weakness Medical History: Negative for: Dementia; Neuropathy; Quadriplegia; Paraplegia; Seizure Disorder Psychiatric Complaints and Symptoms: Negative for: Anxiety; Claustrophobia Medical History: Negative for: Anorexia/bulimia; Confinement Anxiety Oncologic Complaints and Symptoms: No Complaints or Symptoms Complaints and Symptoms: Review of System Notes: leukemia Medical History: Negative for: Received Chemotherapy; Received Radiation HBO Extended History Items Ear/Nose/Mouth/Throat: Chronic sinus problems/congestion Immunizations Pneumococcal Vaccine: Received Pneumococcal Vaccination: No Implantable Devices Family and Social History Cancer: Yes - Mother; Diabetes: Yes - Siblings; Heart Disease: No; Hereditary Spherocytosis: No; Hypertension: Yes - Siblings; Kidney Disease: No; Lung Disease: Yes - Siblings; Seizures: No; Stroke: No; Thyroid Problems: No; Tuberculosis: No; Never smoker; Marital Status - Widowed; Alcohol Use: Never; Drug Use: No History; Caffeine Use: Never; Financial Concerns:  No; Food, Clothing or [name] Needs: No; Support System Lacking: No; Transportation Concerns: No; Advanced Directives: No; Patient does not want information on Advanced Directives; Do not resuscitate: No; Living Will: No; Medical Power of Attorney: No Electronic Signature(s) Signed: 03/05/2018 4:56:16 PM By: [name] Signed: 03/05/2018 5:25:00 PM By: Chilton Si, Kristina M. ([id number]) Entered By: [name] on 03/05/2018 12:54:38 Bohanon, [name] ([id number]) -------------------------------------------------------------------------------- SuperBill Details Patient Name: [name] Date of Service:  03/05/2018 Medical Record Number: [id number] Patient Account Number: [id number] Date of Birth/Sex: [birth year] (77 y.o. 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, in remission Facility Procedures CPT4 Code Description: [id number] - WOUND CARE VISIT-LEV 3 EST PT Modifier: Quantity: 1 CPT4 Code Description: [id number] 97597 - DEBRIDE WOUND 1ST 20 SQ CM OR < ICD-10 Diagnosis Description L97.822 Non-pressure chronic ulcer of other part of left lower leg with fat la L97.812 Non-pressure chronic ulcer of other part of right lower leg with  fat l Modifier: yer exposed ayer expose Quantity: 1 d CPT4 Code Description: [id number] 97598 - DEBRIDE WOUND EA ADDL 20 SQ CM ICD-10 Diagnosis Description L97.822 Non-pressure chronic ulcer of other part of left lower leg with fat la L97.812 Non-pressure chronic ulcer of other part of right lower leg with  fat l Modifier: yer exposed ayer expose Quantity: 8 d CPT4 Code Description: [id number] 11104-Punch biopsy of skin (including simple closure, when performed) single lesion ICD-10 Diagnosis Description L97.822 Non-pressure chronic ulcer of other part of left lower leg with fat la Modifier: yer exposed Quantity: 1 Physician Procedures CPT4 Code Description: [id number] 30076 - WC PHYS LEVEL 4 - NEW PT ICD-10 Diagnosis Description I89.[name] Lymphedema, not elsewhere classified L97.812 Non-pressure chronic ulcer of other part of right lower leg with fat L97.822 Non-pressure chronic ulcer of  other part of left lower  leg with fat l 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

## 2018-03-07 NOTE — Progress Notes (Signed)
ERIKAH, THUMM ([id number]) Visit Report for 03/05/2018 Abuse/Suicide Risk Screen Details Patient Name: [name], [name] Date of Service: 03/05/2018 12:30 PM Medical Record Number: [id number] Patient Account Number: [id number] Date of Birth/Sex: Oct 30, 1940 (77 y.o. Female) Treating RN: Roger Shelter Primary Care Devan Danzer: Salome Holmes Other Clinician: Referring Blaklee Shores: Salome Holmes Treating Prynce Jacober/Extender: Melburn Hake, HOYT Weeks in Treatment: 0 Abuse/Suicide Risk Screen Items Answer ABUSE/SUICIDE RISK SCREEN: Has anyone close to you tried to hurt or harm you recentlyo No Do you feel uncomfortable with anyone in your familyo No Has anyone forced you do things that you didnot want to doo No Do you have any thoughts of harming yourselfo No Patient displays signs or symptoms of abuse and/or neglect. No Electronic Signature(s) Signed: 03/05/2018 4:56:16 PM By: Roger Shelter Entered By: Roger Shelter on 03/05/2018 12:54:47 Rettig, [name] ([id number]) -------------------------------------------------------------------------------- Activities of Daily Living Details Patient Name: [name] Date of Service: 03/05/2018 12:30 PM Medical Record Number: [id number] Patient Account Number: [id number] Date of Birth/Sex: January 09, 1941 (76 y.o. Female) Treating RN: Roger Shelter Primary Care Shalev Helminiak: Salome Holmes Other Clinician: Referring Tacia Hindley: Salome Holmes Treating Bunnie Lederman/Extender: Melburn Hake, HOYT Weeks in Treatment: 0 Activities of Daily Living Items Answer Activities of Daily Living (Please select one for each item) Drive Automobile Need Assistance Take Medications Completely Able Use Telephone Completely Able Care for Appearance Completely Able Use Toilet Completely Able Bath / Shower Completely Able Dress Self Completely Able Feed Self Completely Able Walk Completely Able Get In / Out Bed Completely Able Housework Completely Able Prepare Meals Completely  Rouse for Self Need Assistance Electronic Signature(s) Signed: 03/05/2018 4:56:16 PM By: Roger Shelter Entered By: Roger Shelter on 03/05/2018 12:55:21 Mione, [name] ([id number]) -------------------------------------------------------------------------------- Education Assessment Details Patient Name: [name] Date of Service: 03/05/2018 12:30 PM Medical Record Number: [id number] Patient Account Number: [id number] Date of Birth/Sex: May 16, 1941 (76 y.o. Female) Treating RN: Roger Shelter Primary Care Makinlee Awwad: Salome Holmes Other Clinician: Referring Annielee Jemmott: Salome Holmes Treating Yanin Muhlestein/Extender: Melburn Hake, HOYT Weeks in Treatment: 0 Primary Learner Assessed: Patient Learning Preferences/Education Level/Primary Language Learning Preference: Explanation Highest Education Level: High School Preferred Language: English Cognitive Barrier Assessment/Beliefs Language Barrier: No Translator Needed: No Memory Deficit: No Emotional Barrier: No Cultural/Religious Beliefs Affecting Medical Care: No Physical Barrier Assessment Impaired Vision: Yes Glasses Impaired Hearing: No Decreased Hand dexterity: No Knowledge/Comprehension Assessment Knowledge Level: High Comprehension Level: High Ability to understand written High instructions: Ability to understand verbal High instructions: Motivation Assessment Anxiety Level: Calm Cooperation: Cooperative Education Importance: Acknowledges Need Interest in Health Problems: Asks Questions Perception: Coherent Willingness to Engage in Self- High Management Activities: Readiness to Engage in Self- High Management Activities: Electronic Signature(s) Signed: 03/05/2018 4:56:16 PM By: Roger Shelter Entered By: Roger Shelter on 03/05/2018 12:56:01 Schwimmer, [name] ([id number]) -------------------------------------------------------------------------------- Fall Risk Assessment  Details Patient Name: [name] Date of Service: 03/05/2018 12:30 PM Medical Record Number: [id number] Patient Account Number: [id number] Date of Birth/Sex: [birth year] (77 y.o. Female) Treating RN: Roger Shelter Primary Care Inara Dike: Salome Holmes Other Clinician: Referring Nycole Kawahara: Salome Holmes Treating Daleigh Pollinger/Extender: Melburn Hake, HOYT Weeks in Treatment: 0 Fall Risk Assessment Items Have you had 2 or more falls in the last 12 monthso 0 No Have you had any fall that resulted in injury in the last 12 monthso 0 No FALL RISK ASSESSMENT: History of falling - immediate or within 3 months 0 No Secondary diagnosis 0 No Ambulatory aid None/bed rest/wheelchair/nurse 0 No Crutches/cane/walker 0 No Furniture 0  No IV Access/Saline Lock 0 No Gait/Training Normal/bed rest/immobile 0 No Weak 0 No Impaired 0 No Mental Status Oriented to own ability 0 No Electronic Signature(s) Signed: 03/05/2018 4:56:16 PM By: Roger Shelter Entered By: Roger Shelter on 03/05/2018 12:56:54 Bunte, [name] ([id number]) -------------------------------------------------------------------------------- Foot Assessment Details Patient Name: [name] Date of Service: 03/05/2018 12:30 PM Medical Record Number: [id number] Patient Account Number: [id number] Date of Birth/Sex: 03/23/1941 (76 y.o. Female) Treating RN: Roger Shelter Primary Care Neka Bise: Salome Holmes Other Clinician: Referring Zyire Eidson: Salome Holmes Treating Tammye Kahler/Extender: Melburn Hake, HOYT Weeks in Treatment: 0 Foot Assessment Items Site Locations + = Sensation present, - = Sensation absent, C = Callus, U = Ulcer R = Redness, W = Warmth, M = Maceration, PU = Pre-ulcerative lesion F = Fissure, S = Swelling, D = Dryness Assessment Right: Left: Other Deformity: No No Prior Foot Ulcer: No No Prior Amputation: No No Charcot Joint: No No Ambulatory Status: Ambulatory With Help Assistance Device: Cane Gait:  Steady Electronic Signature(s) Signed: 03/05/2018 4:56:16 PM By: Roger Shelter Entered By: Roger Shelter on 03/05/2018 13:03:24 Dayley, [name] ([id number]) -------------------------------------------------------------------------------- Nutrition Risk Assessment Details Patient Name: [name] Date of Service: 03/05/2018 12:30 PM Medical Record Number: [id number] Patient Account Number: [id number] Date of Birth/Sex: 1941/08/27 (77 y.o. Female) Treating RN: Roger Shelter Primary Care Kathrynne Kulinski: Salome Holmes Other Clinician: Referring Johnaton Sonneborn: Salome Holmes Treating Sheera Illingworth/Extender: Melburn Hake, HOYT Weeks in Treatment: 0 Height (in): 65 Weight (lbs): 143.8 Body Mass Index (BMI): 23.9 Nutrition Risk Assessment Items NUTRITION RISK SCREEN: I have an illness or condition that made me change the kind and/or amount of 0 No food I eat I eat fewer than two meals per day 0 No I eat few fruits and vegetables, or milk products 0 No I have three or more drinks of beer, liquor or wine almost every day 0 No I have tooth or mouth problems that make it hard for me to eat 0 No I don't always have enough money to buy the food I need 0 No I eat alone most of the time 0 No I take three or more different prescribed or over-the-counter drugs a day 0 No Without wanting to, I have lost or gained 10 pounds in the last six months 0 No I am not always physically able to shop, cook and/or feed myself 0 No Nutrition Protocols Good Risk Protocol 0 No interventions needed Moderate Risk Protocol Electronic Signature(s) Signed: 03/05/2018 4:56:16 PM By: Roger Shelter Entered By: Roger Shelter on 03/05/2018 12:57:00

## 2018-03-08 LAB — AEROBIC CULTURE W GRAM STAIN (SUPERFICIAL SPECIMEN)

## 2018-03-08 LAB — AEROBIC CULTURE  (SUPERFICIAL SPECIMEN)

## 2018-03-11 ENCOUNTER — Ambulatory Visit: Payer: Medicare HMO | Admitting: Nurse Practitioner

## 2018-03-12 ENCOUNTER — Encounter: Payer: Medicare HMO | Admitting: Physician Assistant

## 2018-03-12 DIAGNOSIS — L97822 Non-pressure chronic ulcer of other part of left lower leg with fat layer exposed: Secondary | ICD-10-CM | POA: Diagnosis not present

## 2018-03-14 NOTE — Progress Notes (Signed)
[name], [name] ([id number]) Visit Report for 03/12/2018 Arrival Information Details Patient Name: [name], [name] Date of Service: 03/12/2018 3:00 PM Medical Record Number: [id number] Patient Account Number: [id number] Date of Birth/Sex: Oct 10, 1941 (77 y.o. F) Treating RN: Ahmed Prima Primary Care Maurissa Ambrose: Salome Holmes Other Clinician: Referring Anquanette Bahner: Salome Holmes Treating Hennessy Bartel/Extender: Melburn Hake, HOYT Weeks in Treatment: 1 Visit Information History Since Last Visit All ordered tests and consults were completed: No Patient Arrived: [name] Added or deleted any medications: No Arrival Time: 14:56 Any new allergies or adverse reactions: No Accompanied By: self Had a fall or experienced change in No Transfer Assistance: EasyPivot Patient activities of daily living that may affect Lift risk of falls: Patient Identification Verified: Yes Signs or symptoms of abuse/neglect since last visito No Secondary Verification Process Yes Hospitalized since last visit: No Completed: Implantable device outside of the clinic excluding No Patient Requires Transmission-Based No cellular tissue based products placed in the center Precautions: since last visit: Patient Has Alerts: No Has Dressing in Place as Prescribed: Yes Has Compression in Place as Prescribed: Yes Pain Present Now: No Electronic Signature(s) Signed: 03/12/2018 5:38:20 PM By: Alric Quan Entered By: Alric Quan on 03/12/2018 14:56:37 Moccio, Jacky Jerilynn Mages ([id number]) -------------------------------------------------------------------------------- Clinic Level of Care Assessment Details Patient Name: [name] Date of Service: 03/12/2018 3:00 PM Medical Record Number: [id number] Patient Account Number: [id number] Date of Birth/Sex: 18-Sep-1941 (77 y.o. F) Treating RN: Montey Hora Primary Care Naketa Daddario: Salome Holmes Other Clinician: Referring Chelse Matas: Salome Holmes Treating Alanda Colton/Extender: Melburn Hake, HOYT Weeks in Treatment: 1 Clinic Level of Care Assessment Items TOOL 4 Quantity Score []  - Use when only an EandM is performed on FOLLOW-UP visit 0 ASSESSMENTS - Nursing Assessment / Reassessment X - Reassessment of Co-morbidities (includes updates in patient status) 1 10 X- 1 5 Reassessment of Adherence to Treatment Plan ASSESSMENTS - Wound and Skin Assessment / Reassessment []  - Simple Wound Assessment / Reassessment - one wound 0 X- 2 5 Complex Wound Assessment / Reassessment - multiple wounds []  - 0 Dermatologic / Skin Assessment (not related to wound area) ASSESSMENTS - Focused Assessment X - Circumferential Edema Measurements - multi extremities 2 5 []  - 0 Nutritional Assessment / Counseling / Intervention X- 1 5 Lower Extremity Assessment (monofilament, tuning fork, pulses) []  - 0 Peripheral Arterial Disease Assessment (using hand held doppler) ASSESSMENTS - Ostomy and/or Continence Assessment and Care []  - Incontinence Assessment and Management 0 []  - 0 Ostomy Care Assessment and Management (repouching, etc.) PROCESS - Coordination of Care X - Simple Patient / Family Education for ongoing care 1 15 []  - 0 Complex (extensive) Patient / Family Education for ongoing care []  - 0 Staff obtains Programmer, systems, Records, Test Results / Process Orders []  - 0 Staff telephones HHA, Nursing Homes / Clarify orders / etc []  - 0 Routine Transfer to another Facility (non-emergent condition) []  - 0 Routine Hospital Admission (non-emergent condition) []  - 0 New Admissions / Biomedical engineer / Ordering NPWT, Apligraf, etc. []  - 0 Emergency Hospital Admission (emergent condition) X- 1 10 Simple Discharge Coordination Malinak, Erina M. ([id number]) []  - 0 Complex (extensive) Discharge Coordination PROCESS - Special Needs []  - Pediatric / Minor Patient Management 0 []  - 0 Isolation Patient Management []  - 0 Hearing / Language / Visual special needs []  - 0 Assessment of  Community assistance (transportation, D/C planning, etc.) []  - 0 Additional assistance / Altered mentation []  - 0 Support Surface(s) Assessment (bed, cushion, seat, etc.) INTERVENTIONS - Wound Cleansing /  Measurement []  - Simple Wound Cleansing - one wound 0 X- 2 5 Complex Wound Cleansing - multiple wounds X- 1 5 Wound Imaging (photographs - any number of wounds) []  - 0 Wound Tracing (instead of photographs) []  - 0 Simple Wound Measurement - one wound X- 2 5 Complex Wound Measurement - multiple wounds INTERVENTIONS - Wound Dressings []  - Small Wound Dressing one or multiple wounds 0 []  - 0 Medium Wound Dressing one or multiple wounds X- 2 20 Large Wound Dressing one or multiple wounds []  - 0 Application of Medications - topical []  - 0 Application of Medications - injection INTERVENTIONS - Miscellaneous []  - External ear exam 0 []  - 0 Specimen Collection (cultures, biopsies, blood, body fluids, etc.) []  - 0 Specimen(s) / Culture(s) sent or taken to Lab for analysis []  - 0 Patient Transfer (multiple staff / Civil Service fast streamer / Similar devices) []  - 0 Simple Staple / Suture removal (25 or less) []  - 0 Complex Staple / Suture removal (26 or more) []  - 0 Hypo / Hyperglycemic Management (close monitor of Blood Glucose) X- 1 15 Ankle / Brachial Index (ABI) - do not check if billed separately X- 1 5 Vital Signs Timme, Zoila M. ([id number]) Has the patient been seen at the hospital within the last three years: Yes Total Score: 150 Level Of Care: New/Established - Level 4 Electronic Signature(s) Signed: 03/12/2018 5:45:37 PM By: Montey Hora Entered By: Montey Hora on 03/12/2018 16:10:53 Norrington, [name] ([id number]) -------------------------------------------------------------------------------- Encounter Discharge Information Details Patient Name: [name] Date of Service: 03/12/2018 3:00 PM Medical Record Number: [id number] Patient Account Number: [id number] Date of  Birth/Sex: 07/01/1941 (77 y.o. F) Treating RN: Ahmed Prima Primary Care Vallie Fayette: Salome Holmes Other Clinician: Referring Michole Lecuyer: Salome Holmes Treating Needham Biggins/Extender: Melburn Hake, HOYT Weeks in Treatment: 1 Encounter Discharge Information Items Discharge Condition: Stable Ambulatory Status: Ambulatory Discharge Destination: Home Transportation: Private Auto Accompanied By: self Schedule Follow-up Appointment: Yes Clinical Summary of Care: Electronic Signature(s) Signed: 03/12/2018 5:01:10 PM By: Alric Quan Entered By: Alric Quan on 03/12/2018 17:01:10 Pieroni, [name] ([id number]) -------------------------------------------------------------------------------- Lower Extremity Assessment Details Patient Name: [name] Date of Service: 03/12/2018 3:00 PM Medical Record Number: [id number] Patient Account Number: [id number] Date of Birth/Sex: [birth year] (77 y.o. F) Treating RN: Ahmed Prima Primary Care Zayven Powe: Salome Holmes Other Clinician: Referring Demerius Podolak: Salome Holmes Treating Franciscojavier Wronski/Extender: Melburn Hake, HOYT Weeks in Treatment: 1 Edema Assessment Assessed: [Left: No] [Right: No] [Left: Edema] [Right: :] Calf Left: Right: Point of Measurement: 35 cm From Medial Instep 40 cm 33.5 cm Ankle Left: Right: Point of Measurement: 12 cm From Medial Instep 30 cm 27.5 cm Vascular Assessment Pulses: Dorsalis Pedis Palpable: [Left:Yes] [Right:Yes] Posterior Tibial Extremity colors, hair growth, and conditions: Extremity Color: [Left:Hyperpigmented] [Right:Hyperpigmented] Hair Growth on Extremity: [Left:No] [Right:No] Temperature of Extremity: [Left:Cool] [Right:Cool] Capillary Refill: [Left:> 3 seconds] [Right:< 3 seconds] Blood Pressure: Brachial: [Left:146] [Right:144] Dorsalis Pedis: 166 [Left:Dorsalis Pedis: 158] Ankle: Posterior Tibial: [Left:Posterior Tibial: 1.14] [Right:1.08] Toe Nail Assessment Left: Right: Thick: Yes  Yes Discolored: Yes Yes Deformed: Yes Yes Improper Length and Hygiene: Yes Yes Electronic Signature(s) Signed: 03/12/2018 5:38:20 PM By: Alric Quan Signed: 03/12/2018 5:45:37 PM By: Montey Hora Entered By: Montey Hora on 03/12/2018 16:01:29 Furber, Lucendia Jerilynn Mages ([id number]) -------------------------------------------------------------------------------- Multi Wound Chart Details Patient Name: [name] Date of Service: 03/12/2018 3:00 PM Medical Record Number: [id number] Patient Account Number: [id number] Date of Birth/Sex: June 17, 1941 (77 y.o. F) Treating RN: Montey Hora Primary Care Rashawna Scoles: Salome Holmes Other Clinician:  Referring Dodd Schmid: Salome Holmes Treating Particia Strahm/Extender: Melburn Hake, HOYT Weeks in Treatment: 1 Vital Signs Height(in): 65 Pulse(bpm): 69 Weight(lbs): 143.8 Blood Pressure(mmHg): 152/68 Body Mass Index(BMI): 24 Temperature(F): 98.3 Respiratory Rate 18 (breaths/min): Photos: [1:No Photos] [2:No Photos] [N/A:N/A] Wound Location: [1:Right, Lateral Lower Leg] [2:Left, Lateral Lower Leg] [N/A:N/A] Wounding Event: [1:Blister] [2:Blister] [N/A:N/A] Primary Etiology: [1:Lymphedema] [2:Lymphedema] [N/A:N/A] Date Acquired: [1:02/19/2018] [2:02/19/2018] [N/A:N/A] Weeks of Treatment: [1:1] [2:1] [N/A:N/A] Wound Status: [1:Open] [2:Open] [N/A:N/A] Measurements L x W x D [1:7.5x12x0.2] [2:12x16x0.2] [N/A:N/A] (cm) Area (cm) : [1:70.686] [2:150.796] [N/A:N/A] Volume (cm) : [1:14.137] [2:30.159] [N/A:N/A] % Reduction in Area: [1:44.10%] [2:22.10%] [N/A:N/A] % Reduction in Volume: [1:44.10%] [2:22.10%] [N/A:N/A] Classification: [1:Full Thickness Without Exposed Support Structures] [2:Full Thickness Without Exposed Support Structures] [N/A:N/A] Periwound Skin Texture: [1:No Abnormalities Noted] [2:No Abnormalities Noted] [N/A:N/A] Periwound Skin Moisture: [1:No Abnormalities Noted] [2:No Abnormalities Noted] [N/A:N/A] Periwound Skin Color: [1:No  Abnormalities Noted No] [2:No Abnormalities Noted No] [N/A:N/A N/A] Treatment Notes Electronic Signature(s) Signed: 03/12/2018 5:45:37 PM By: Montey Hora Entered By: Montey Hora on 03/12/2018 15:43:50 Allie, [name] ([id number]) -------------------------------------------------------------------------------- Twilight Details Patient Name: [name] Date of Service: 03/12/2018 3:00 PM Medical Record Number: [id number] Patient Account Number: [id number] Date of Birth/Sex: 17-Dec-1940 (78 y.o. F) Treating RN: Montey Hora Primary Care Kirill Chatterjee: Salome Holmes Other Clinician: Referring Johnie Stadel: Salome Holmes Treating Lanika Colgate/Extender: Melburn Hake, HOYT Weeks in Treatment: 1 Active Inactive ` Abuse / Safety / Falls / Self Care Management Nursing Diagnoses: Impaired physical mobility Goals: Patient will not experience any injury related to falls Date Initiated: 03/05/2018 Target Resolution Date: 05/22/2018 Goal Status: Active Interventions: Assess fall risk on admission and as needed Notes: ` Orientation to the Wound Care Program Nursing Diagnoses: Knowledge deficit related to the wound healing center program Goals: Patient/caregiver will verbalize understanding of the [facility] Date Initiated: 03/05/2018 Target Resolution Date: 05/22/2018 Goal Status: Active Interventions: Provide education on orientation to the wound center Notes: ` Wound/Skin Impairment Nursing Diagnoses: Impaired tissue integrity Goals: Ulcer/skin breakdown will heal within 14 weeks Date Initiated: 03/05/2018 Target Resolution Date: 05/22/2018 Goal Status: Active Interventions: SAADIYA, WILFONG ([id number]) Assess patient/caregiver ability to obtain necessary supplies Assess patient/caregiver ability to perform ulcer/skin care regimen upon admission and as needed Assess ulceration(s) every visit Notes: Electronic Signature(s) Signed: 03/12/2018 5:45:37  PM By: Montey Hora Entered By: Montey Hora on 03/12/2018 15:43:42 Denbow, [name] ([id number]) -------------------------------------------------------------------------------- Pain Assessment Details Patient Name: [name] Date of Service: 03/12/2018 3:00 PM Medical Record Number: [id number] Patient Account Number: [id number] Date of Birth/Sex: 1941/06/30 (77 y.o. F) Treating RN: Ahmed Prima Primary Care Santi Troung: Salome Holmes Other Clinician: Referring Floreine Kingdon: Salome Holmes Treating Shatina Streets/Extender: Melburn Hake, HOYT Weeks in Treatment: 1 Active Problems Location of Pain Severity and Description of Pain Patient Has Paino No Site Locations Pain Management and Medication Current Pain Management: Electronic Signature(s) Signed: 03/12/2018 5:38:20 PM By: Alric Quan Entered By: Alric Quan on 03/12/2018 14:56:43 Benefiel, [name] ([id number]) -------------------------------------------------------------------------------- Patient/Caregiver Education Details Patient Name: [name] Date of Service: 03/12/2018 3:00 PM Medical Record Number: [id number] Patient Account Number: [id number] Date of Birth/Gender: [birth year] (77 y.o. F) Treating RN: Ahmed Prima Primary Care Physician: Salome Holmes Other Clinician: Referring Physician: Salome Holmes Treating Physician/Extender: Sharalyn Ink in Treatment: 1 Education Assessment Education Provided To: Patient Education Topics Provided Wound/Skin Impairment: Handouts: Caring for Your Ulcer, Skin Care Do's and Dont's, Other: change dressing as ordered Methods: Demonstration, Explain/Verbal Responses: State content correctly Electronic Signature(s) Signed: 03/12/2018 5:38:20  PM By: Alric Quan Entered By: Alric Quan on 03/12/2018 17:01:26 Pereyra, Daniela Jerilynn Mages ([id number]) -------------------------------------------------------------------------------- Wound Assessment Details Patient  Name: [name] Date of Service: 03/12/2018 3:00 PM Medical Record Number: [id number] Patient Account Number: [id number] Date of Birth/Sex: June 03, 1941 (77 y.o. F) Treating RN: Ahmed Prima Primary Care Anacarolina Evelyn: Salome Holmes Other Clinician: Referring Alima Naser: Salome Holmes Treating Perl Folmar/Extender: Melburn Hake, HOYT Weeks in Treatment: 1 Wound Status Wound Number: 1 Primary Etiology: Lymphedema Wound Location: Right, Lateral Lower Leg Wound Status: Open Wounding Event: Blister Date Acquired: 02/19/2018 Weeks Of Treatment: 1 Clustered Wound: No Photos Photo Uploaded By: Gretta Cool, BSN, RN, CWS, Kim on 03/12/2018 16:37:50 Wound Measurements Length: (cm) 7.5 Width: (cm) 12 Depth: (cm) 0.2 Area: (cm) 70.686 Volume: (cm) 14.137 % Reduction in Area: 44.1% % Reduction in Volume: 44.1% Wound Description Full Thickness Without Exposed Support Classification: Structures Periwound Skin Texture Texture Color No Abnormalities Noted: No No Abnormalities Noted: No Moisture No Abnormalities Noted: No Treatment Notes Wound #1 (Right, Lateral Lower Leg) 1. Cleansed with: Clean wound with Normal Saline Cleanse wound with antibacterial soap and water 2. Anesthetic Topical Lidocaine 4% cream to wound bed prior to debridement Banos, Thora M. ([id number]) 4. Dressing Applied: Xeroform 5. Secondary Dressing Applied ABD Pad 7. Secured with Tape Notes unna to anchor, kerlix, coban Electronic Signature(s) Signed: 03/12/2018 5:38:20 PM By: Alric Quan Entered By: Alric Quan on 03/12/2018 15:10:02 Toutant, [name] ([id number]) -------------------------------------------------------------------------------- Wound Assessment Details Patient Name: [name] Date of Service: 03/12/2018 3:00 PM Medical Record Number: [id number] Patient Account Number: [id number] Date of Birth/Sex: 22-Jun-1941 (77 y.o. F) Treating RN: Ahmed Prima Primary Care Myrtle Haller: Salome Holmes Other Clinician: Referring Emelly Wurtz: Salome Holmes Treating Jaidence Geisler/Extender: Melburn Hake, HOYT Weeks in Treatment: 1 Wound Status Wound Number: 2 Primary Etiology: Lymphedema Wound Location: Left, Lateral Lower Leg Wound Status: Open Wounding Event: Blister Date Acquired: 02/19/2018 Weeks Of Treatment: 1 Clustered Wound: No Photos Photo Uploaded By: Gretta Cool, BSN, RN, CWS, Kim on 03/12/2018 16:37:51 Wound Measurements Length: (cm) 12 Width: (cm) 16 Depth: (cm) 0.2 Area: (cm) 150.796 Volume: (cm) 30.159 % Reduction in Area: 22.1% % Reduction in Volume: 22.1% Wound Description Full Thickness Without Exposed Support Classification: Structures Periwound Skin Texture Texture Color No Abnormalities Noted: No No Abnormalities Noted: No Moisture No Abnormalities Noted: No Treatment Notes Wound #2 (Left, Lateral Lower Leg) 1. Cleansed with: Clean wound with Normal Saline Cleanse wound with antibacterial soap and water 2. Anesthetic Topical Lidocaine 4% cream to wound bed prior to debridement Kohles, Idania M. ([id number]) 4. Dressing Applied: Xeroform 5. Secondary Dressing Applied ABD Pad 7. Secured with Tape Notes unna to anchor, kerlix, coban Electronic Signature(s) Signed: 03/12/2018 5:38:20 PM By: Alric Quan Entered By: Alric Quan on 03/12/2018 15:10:02 Kershaw, [name] ([id number]) -------------------------------------------------------------------------------- Vitals Details Patient Name: [name] Date of Service: 03/12/2018 3:00 PM Medical Record Number: [id number] Patient Account Number: [id number] Date of Birth/Sex: 27-Mar-1941 (77 y.o. F) Treating RN: Ahmed Prima Primary Care Yzabella Crunk: Salome Holmes Other Clinician: Referring Javen Hinderliter: Salome Holmes Treating Barney Gertsch/Extender: Melburn Hake, HOYT Weeks in Treatment: 1 Vital Signs Time Taken: 14:56 Temperature (F): 98.3 Height (in): 65 Pulse (bpm): 69 Weight (lbs):  143.8 Respiratory Rate (breaths/min): 18 Body Mass Index (BMI): 23.9 Blood Pressure (mmHg): 152/68 Reference Range: 80 - 120 mg / dl Electronic Signature(s) Signed: 03/12/2018 5:38:20 PM By: Alric Quan Entered By: Alric Quan on 03/12/2018 14:59:03

## 2018-03-15 NOTE — Progress Notes (Signed)
DORETHEA, STRUBEL ([id number]) Visit Report for 03/12/2018 Chief Complaint Document Details Patient Name: [name], [name] Date of Service: 03/12/2018 3:00 PM Medical Record Number: [id number] Patient Account Number: [id number] Date of Birth/Sex: [birth year] (77 y.o. F) Treating RN: Montey Hora Primary Care Provider: Salome Holmes Other Clinician: Referring Provider: Salome Holmes Treating Provider/Extender: Melburn Hake, HOYT Weeks in Treatment: 1 Information Obtained from: Patient Chief Complaint Bilateral LE Blisters/Ulcers Electronic Signature(s) Signed: 03/12/2018 11:45:50 PM By: Worthy Keeler PA-C Entered By: Worthy Keeler on 03/12/2018 14:45:51 Eagon, [name] ([id number]) -------------------------------------------------------------------------------- HPI Details Patient Name: [name] Date of Service: 03/12/2018 3:00 PM Medical Record Number: [id number] Patient Account Number: [id number] Date of Birth/Sex: Mar 23, 1941 (77 y.o. F) Treating RN: Montey Hora Primary Care Provider: Salome Holmes Other Clinician: Referring Provider: Salome Holmes Treating Provider/Extender: Melburn Hake, HOYT Weeks in Treatment: 1 History of Present Illness HPI Description: 03/05/18 on evaluation today patient presents for initial evaluation and our clinic due to significantly large blisters of the bilateral lower extremities which began initially on 02/18/18. 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 Secondarily it does appear that they felt she likely had cellulitis. Subsequently the patient was seen for fault evaluation on 03/03/18 and that is really when the cellulitis was diagnosed. She was actually placed on doxycycline 100 mg by mouth twice a day for 10 days. With that being said she was prescribed prednisone although she did not tolerate this. Apparently she had some alteration in her  mental status with taking this. 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 She has never smoked and unfortunately does have pain at the site where these blisters are located of the bilateral lower extremities. She also does appear to have significant lymphedema which obviously has been more chronic. 03/12/18 on evaluation today patient actually appears to be doing much better in regard to her bilateral [employer] ulcers. Unfortunately a lot of the alginate dressing did stick to her legs due to the fact that everything dried up rather rapidly in the compression seems to have done very well in that regard. Unfortunately the home health nurse last time she came out to the patient's home was not able to fully remove the alginate from her leg so she just rewrapped it with what was there continuing to be in place. Obviously that does need to come off but overall I'm extremely pleased with the progress it's been made over the past week which is the Kerlex and Coban wraps. Electronic Signature(s) Signed: 03/12/2018 11:45:50 PM By: Worthy Keeler PA-C Entered By: Worthy Keeler on 03/12/2018 23:39:14 Feggins, [name] ([id number]) -------------------------------------------------------------------------------- Physical Exam Details Patient Name: [name] Date of Service: 03/12/2018 3:00 PM Medical Record Number: [id number] Patient Account Number: [id number] Date of Birth/Sex: 1940/10/14 (77 y.o. F) Treating RN: Montey Hora Primary Care Provider: Salome Holmes Other Clinician: Referring Provider: Salome Holmes Treating Provider/Extender: Melburn Hake, HOYT Weeks in Treatment: 1 Constitutional Obese and well-hydrated in no acute distress. Respiratory normal breathing without difficulty. clear to auscultation bilaterally. Cardiovascular regular rate and rhythm with normal S1, S2. 1+ pitting edema of the bilateral lower  extremities. Psychiatric this patient is able to make decisions and demonstrates good insight into disease process. Alert and Oriented x 3. pleasant and cooperative. Notes Patient has no evidence of infection at this time she also  seems to have tolerated the compression wrapping without any complication whatsoever. Her demon as much better in the overall appearance of her ulcers also improved. Of note I did receive the results of her culture which should multiple organisms none predominate. Also did receive the biopsy report back which showed evidence of what was deemed lymphedema but nothing more. Overall this is good news of does fit with clinically what we are seeing and what my suspicion was in regard to her diagnosis. Electronic Signature(s) Signed: 03/12/2018 11:45:50 PM By: Worthy Keeler PA-C Entered By: Worthy Keeler on 03/12/2018 23:40:09 Mcconathy, [name] ([id number]) -------------------------------------------------------------------------------- Physician Orders Details Patient Name: [name] Date of Service: 03/12/2018 3:00 PM Medical Record Number: [id number] Patient Account Number: [id number] Date of Birth/Sex: Oct 05, 1941 (77 y.o. 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 Wound #2 Left,Lateral Lower Leg o Clean wound with Normal Saline. Anesthetic (add to Medication List) Wound #1  Right,Lateral Lower Leg o Benzocaine Topical Anesthetic Spray applied to wound bed prior to debridement (In Clinic Only). Wound #2 Left,Lateral Lower Leg o Benzocaine Topical Anesthetic Spray applied to wound bed prior to debridement (In Clinic Only). Primary Wound Dressing Wound #1 Right,Lateral Lower Leg o Xeroform Wound #2 Left,Lateral Lower Leg o Xeroform Secondary Dressing Wound #1 Right,Lateral Lower Leg o ABD pad Wound #2 Left,Lateral Lower Leg o ABD pad Dressing Change Frequency Wound #1 Right,Lateral Lower Leg o Change Dressing Monday, Wednesday, Friday Wound #2 Left,Lateral Lower Leg o Change Dressing Monday, Wednesday, Friday ALDINA, PORTA. ([id number]) Follow-up Appointments Wound #1 Right,Lateral Lower Leg o Return Appointment in 1 week. Wound #2 Left,Lateral Lower Leg o Return Appointment in 1 week. Home Health Wound #1 [place] Visits o Home Health Nurse may visit PRN to address patientos wound care needs. o FACE TO FACE ENCOUNTER: MEDICARE and MEDICAID PATIENTS: I certify that this patient is under my care and that I had a face-to-face encounter that meets the physician face-to-face encounter requirements with this patient on this date. The encounter with the patient was in whole or in part for the following MEDICAL CONDITION: (primary reason for [place]) MEDICAL NECESSITY: I certify, that based on my findings, NURSING services are a medically necessary home health service. HOME BOUND STATUS: I certify that my clinical findings support that this patient is homebound (i.e., Due to illness or injury, pt requires aid of supportive devices such as crutches, cane, wheelchairs, walkers, the use of special transportation or the assistance of another person to leave their place of residence. There is a normal inability to leave the home and doing so requires considerable and taxing effort. Other  absences are for medical reasons / religious services and are infrequent or of short duration when for other reasons). o If current dressing causes regression in wound condition, may D/C ordered dressing product/s and apply Normal Saline Moist Dressing daily until next [place] / Other MD appointment. [place] of regression in wound condition at  [phone]. o Please direct any NON-WOUND related issues/requests for orders to patient's Primary Care Physician Wound #2 [place] Nurse may visit PRN to address patientos wound care needs. o FACE TO FACE ENCOUNTER: MEDICARE and MEDICAID PATIENTS: I certify that this patient is under my care and that I had a face-to-face encounter that meets the physician face-to-face encounter requirements with this patient on this date. The encounter with the patient was in whole or in part for the following MEDICAL CONDITION: (primary reason for [place]) MEDICAL NECESSITY: I certify, that based on my findings, NURSING services are a medically necessary home health service. HOME BOUND STATUS: I certify that my clinical findings support that this patient is homebound (i.e., Due to illness or injury, pt requires aid of supportive devices such as crutches, cane, wheelchairs, walkers, the use of special transportation or the assistance of another person to leave their place of residence. There is a normal inability to leave the home and doing so requires considerable and taxing effort. Other absences are for medical reasons / religious services and are infrequent or of short duration when for other reasons). o If current dressing causes regression in wound condition, may D/C ordered dressing product/s and apply Normal Saline Moist Dressing daily until next [place] / Other MD appointment. [place] of regression in wound condition at  [phone]. o Please direct any NON-WOUND related issues/requests for orders to patient's Primary Care Physician Electronic Signature(s) Signed: 03/12/2018 5:45:37 PM By: Montey Hora Signed: 03/12/2018 11:45:50 PM By: Worthy Keeler PA-C Entered By: Montey Hora on 03/12/2018 16:02:30 Strickling, [name] ([id number]) -------------------------------------------------------------------------------- Problem List Details Patient Name: [name] Date of Service: 03/12/2018 3:00 PM Medical Record Number: [id number] Patient Account Number: [id number] Date of Birth/Sex: 12/12/1940 (77 y.o. 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 F) Treating RN: Montey Hora Primary Care Provider: Salome Holmes Other Clinician: Referring Provider: Salome Holmes Treating Provider/Extender: Melburn Hake, HOYT Weeks in Treatment: 1 Subjective Chief  Complaint Information obtained from Patient Bilateral LE Blisters/Ulcers History of Present Illness (HPI) 03/05/18 on evaluation today patient presents for initial evaluation and our clinic due to significantly large blisters of the bilateral lower extremities which began initially on 02/18/18. 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 Secondarily it does appear that they felt she likely had cellulitis. Subsequently the patient was seen for fault evaluation on 03/03/18 and that is really when the cellulitis was diagnosed. She was actually placed on doxycycline 100 mg by mouth twice a day for 10 days.  With that being said she was prescribed prednisone although she did not tolerate this. Apparently she had some alteration in her mental status with taking this. 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 She has never smoked and unfortunately does have pain at the site where these blisters are located of the bilateral lower extremities. She also does appear to have significant lymphedema which obviously has been more chronic. 03/12/18 on evaluation today patient actually appears to be doing much better in regard to her bilateral [employer] ulcers. Unfortunately a lot of the alginate dressing did stick to her legs due to the fact that everything dried up rather rapidly in the compression seems to have done very well in that regard. Unfortunately the home health nurse last time she came out to the patient's home was not able to fully remove the alginate from her leg so she just rewrapped it with what was there continuing to be in place. Obviously that does need to come off but overall I'm extremely pleased with the progress it's been made over the past week which is the Kerlex and Coban wraps. Patient History Information obtained from  Patient. Family History Cancer - Mother, Diabetes - Siblings, Hypertension - Siblings, Lung Disease - Siblings, No family history of Heart Disease, Hereditary Spherocytosis, Kidney Disease, Seizures, Stroke, Thyroid Problems, Tuberculosis. Social History Never smoker, Marital Status - Widowed, Alcohol Use - Never, Drug Use - No History, Caffeine Use - Never. Review of Systems (ROS) Constitutional Symptoms (General Health) Denies complaints or symptoms of Fever, Chills, Marked Weight Change. Respiratory The patient has no complaints or symptoms. Cardiovascular Complains or has symptoms of LE edema. Psychiatric The patient has no complaints or symptoms. TEDDY, PENA ([id number]) Objective Constitutional Obese and well-hydrated in no acute distress. Vitals Time Taken: 2:56 PM, Height: 65 in, Weight: 143.8 lbs, BMI: 23.9, Temperature: 98.3 F, Pulse: 69 bpm, Respiratory Rate: 18 breaths/min, Blood Pressure: 152/68 mmHg. Respiratory normal breathing without difficulty. clear to auscultation bilaterally. Cardiovascular regular rate and rhythm with normal S1, S2. 1+ pitting edema of the bilateral lower extremities. Psychiatric this patient is able to make decisions and demonstrates good insight into disease process. Alert and Oriented x 3. pleasant and cooperative. General Notes: Patient has no evidence of infection at this time she also seems to have tolerated the compression wrapping without any complication whatsoever. Her demon as much better in the overall appearance of her ulcers also improved. Of note I did receive the results of her culture which should multiple organisms none predominate. Also did receive the biopsy report back which showed evidence of what was deemed lymphedema but nothing more. Overall this is good news of does fit with clinically what we are seeing and what my suspicion was in regard to her diagnosis. Integumentary (Hair, Skin) Wound #1 status is Open.  Original cause of wound was Blister. The wound is located on the Right,Lateral Lower Leg. The wound measures 7.5cm length x 12cm width x 0.2cm depth; 70.686cm^2 area and 14.137cm^3 volume. Wound #2 status is Open. Original cause of wound was Blister. The wound is located on the Left,Lateral Lower Leg. The wound measures 12cm length x 16cm width x 0.2cm depth; 150.796cm^2 area and 30.159cm^3 volume. 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 ([id number]) Wound Cleansing: Wound #1 Right,Lateral Lower Leg: Clean wound with Normal Saline. Wound #2 Left,Lateral Lower Leg: Clean wound with Normal Saline. Anesthetic (add to Medication List): Wound #1 Right,Lateral Lower Leg: Benzocaine Topical Anesthetic Spray applied to wound bed prior to debridement (In Clinic Only). Wound #2 Left,Lateral Lower Leg: Benzocaine Topical Anesthetic Spray applied to wound bed prior to debridement (In Clinic Only). Primary Wound Dressing: Wound #1 Right,Lateral Lower Leg: Xeroform Wound #2 Left,Lateral Lower Leg: Xeroform Secondary Dressing: Wound #1 Right,Lateral Lower Leg: ABD pad Wound #2 Left,Lateral Lower Leg: ABD pad Dressing Change Frequency: Wound #1 Right,Lateral Lower Leg: Change Dressing Monday, Wednesday, Friday Wound #2 Left,Lateral Lower Leg: Change Dressing Monday, Wednesday, Friday Follow-up Appointments: Wound #1 Right,Lateral Lower Leg: Return Appointment in 1 week. Wound #2 Left,Lateral Lower Leg: Return Appointment in 1 week. Home Health: Wound #1 Right,Lateral Lower Leg: [place] Nurse may visit PRN to address patient s wound care needs. FACE TO FACE  ENCOUNTER: MEDICARE and MEDICAID PATIENTS: I certify that this patient is under my care and that I had a face-to-face encounter that meets the physician face-to-face encounter requirements with this patient on this date. The encounter with the patient was in whole or in part for the following MEDICAL CONDITION: (primary reason for [place]) MEDICAL NECESSITY: I certify, that based on my findings, NURSING services are a medically necessary home health service. HOME BOUND STATUS: I certify that my clinical findings support that this patient is homebound (i.e., Due to illness or injury, pt requires aid of supportive devices such as crutches, cane, wheelchairs, walkers, the use of special transportation or the assistance of another person to leave their place of residence. There is a normal inability to leave the home and doing so requires considerable and taxing effort. Other absences are for medical reasons / religious services and are infrequent or of short duration when for other reasons). If current dressing causes regression in wound condition, may D/C ordered dressing product/s and apply Normal Saline Moist Dressing daily until next [place] / Other MD appointment. [place] of regression in wound condition at [phone]. Please direct any NON-WOUND related issues/requests for orders to patient's Primary Care Physician Wound #2 Left,Lateral Lower Leg: [place] Nurse may visit PRN to address patient s wound care needs. FACE TO FACE ENCOUNTER: MEDICARE and MEDICAID PATIENTS: I certify that this patient is under my care and that I had a face-to-face encounter that meets the physician face-to-face encounter requirements with this patient on this date. The encounter with the patient was in whole or in part for the following MEDICAL CONDITION: (primary reason for [place]) MEDICAL NECESSITY: I certify, that based on my  findings, NURSING services are a medically necessary home health service. HOME BOUND STATUS: I certify that my clinical findings support that this patient is homebound (i.e., Due to illness or injury, pt requires aid of supportive devices such as crutches, cane, wheelchairs, walkers, the use of special transportation or the assistance of another person to leave their place of residence. There is a normal inability to leave the home and doing so requires considerable and taxing effort. Other absences are for medical reasons / religious services and are infrequent or of short duration when for other reasons). KAYELYNN, ABDOU ([id number]) If current dressing causes regression in wound condition, may D/C ordered  dressing product/s and apply Normal Saline Moist Dressing daily until next [place] / Other MD appointment. [place] of regression in wound condition at [phone]. Please direct any NON-WOUND related issues/requests for orders to patient's Primary Care Physician I'm gonna recommend that we go ahead and continue with the current compression wraps with Kerlex and Coban since she has done so well. Her legs were actually significantly smaller than previously noted. We will initiate treatment with Xeroform gauze in place of the alginate since everything was getting stuck. She is in agreement with this plan. We will see were things stand in one weeks time. Please see above for specific wound care orders. We will see patient for re-evaluation in 1 week(s) here in the clinic. If anything worsens or changes patient will contact our office for additional recommendations. Electronic Signature(s) Signed: 03/12/2018 11:45:50 PM By: Worthy Keeler PA-C Entered By: Worthy Keeler on 03/12/2018 23:40:53 Bracher, [name] ([id number]) -------------------------------------------------------------------------------- ROS/PFSH Details Patient Name: [name] Date of  Service: 03/12/2018 3:00 PM Medical Record Number: [id number] Patient Account Number: [id number] Date of Birth/Sex: 03/20/1941 (77 y.o. F) Treating RN: Montey Hora Primary Care Provider: Salome Holmes Other Clinician: Referring Provider: Salome Holmes Treating Provider/Extender: Melburn Hake, HOYT Weeks in Treatment: 1 Information Obtained From Patient Wound History Do you currently have one or more open woundso Yes How many open wounds do you currently haveo 2 Approximately how long have you had your woundso 2 weeks How have you been treating your wound(s) until nowo gauze Has your wound(s) ever healed and then re-openedo No Have you had any lab work done in the past montho Yes Have you tested positive for an antibiotic resistant organism (MRSA, VRE)o No Have you tested positive for osteomyelitis (bone infection)o No Have you had any tests for circulation on your legso No Have you had other problems associated with your woundso Infection Constitutional Symptoms (General Health) Complaints and Symptoms: Negative for: Fever; Chills; Marked Weight Change Cardiovascular Complaints and Symptoms: Positive for: LE edema Medical History: Positive for: Angina; Arrhythmia - murmur; Hypertension Negative for: Congestive Heart Failure; Coronary Artery Disease; Deep Vein Thrombosis; Hypotension; Myocardial Infarction; Peripheral Arterial Disease; Peripheral Venous Disease; Phlebitis; Vasculitis Eyes Medical History: Negative for: Cataracts; Glaucoma; Optic Neuritis Ear/Nose/Mouth/Throat Medical History: Positive for: Chronic sinus problems/congestion - allergys seasonal Negative for: Middle ear problems Hematologic/Lymphatic Medical History: Positive for: Anemia; Lymphedema Negative for: Hemophilia; Human Immunodeficiency Virus; Sickle Cell Disease Respiratory Complaints and Symptoms: No Complaints or Symptoms Tisdell, Charlet M. ([id number]) Medical History: Negative for: Aspiration;  Asthma; Chronic Obstructive Pulmonary Disease (COPD); Pneumothorax; Sleep Apnea; Tuberculosis Gastrointestinal Medical History: Negative for: Cirrhosis ; Colitis; Crohnos; Hepatitis A; Hepatitis B; Hepatitis C Endocrine Medical History: Negative for: Type I Diabetes; Type II Diabetes Genitourinary Medical History: Negative for: End Stage Renal Disease Immunological Medical History: Negative for: Lupus Erythematosus; Raynaudos; Scleroderma Integumentary (Skin) Medical History: Negative for: History of Burn; History of pressure wounds Musculoskeletal Medical History: Positive for: Gout; Rheumatoid Arthritis; Osteoarthritis Negative for: Osteomyelitis Neurologic Medical History: Negative for: Dementia; Neuropathy; Quadriplegia; Paraplegia; Seizure Disorder Oncologic Medical History: Negative for: Received Chemotherapy; Received Radiation Psychiatric Complaints and Symptoms: No Complaints or Symptoms Medical History: Negative for: Anorexia/bulimia; Confinement Anxiety HBO Extended History Items Ear/Nose/Mouth/Throat: Chronic sinus problems/congestion Immunizations Thier, JAZZELLE ZHANG. 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 Electronic Signature(s) Signed: 03/12/2018 11:45:50 PM By: Worthy Keeler PA-C Signed: 03/15/2018 3:38:14 PM By: Montey Hora Entered By: Worthy Keeler on 03/12/2018 23:39:34 Stapel, [name] ([id number]) -------------------------------------------------------------------------------- SuperBill Details Patient Name: [name] Date of Service: 03/12/2018 Medical Record Number: [id number] Patient Account Number: [id number] Date of Birth/Sex: 22-Mar-1941 (77 y.o. 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 myelomonocytic leukemia, in remission Facility Procedures CPT4 Code: [id number] Description: 99214 - WOUND CARE VISIT-LEV 4 EST PT Modifier: Quantity: 1 Physician Procedures CPT4 Code Description: [id number] 99214 - WC PHYS LEVEL 4 - EST PT ICD-10 Diagnosis Description I89.0 Lymphedema, not elsewhere classified L97.822 Non-pressure chronic ulcer of other part of left lower leg wit L97.812 Non-pressure chronic ulcer of other  part of right lower leg wi 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

## 2018-03-18 ENCOUNTER — Encounter: Payer: Medicare HMO | Attending: Nurse Practitioner | Admitting: Nurse Practitioner

## 2018-03-18 DIAGNOSIS — L97811 Non-pressure chronic ulcer of other part of right lower leg limited to breakdown of skin: Secondary | ICD-10-CM | POA: Insufficient documentation

## 2018-03-18 DIAGNOSIS — I10 Essential (primary) hypertension: Secondary | ICD-10-CM | POA: Insufficient documentation

## 2018-03-18 DIAGNOSIS — M109 Gout, unspecified: Secondary | ICD-10-CM | POA: Insufficient documentation

## 2018-03-18 DIAGNOSIS — C9311 Chronic myelomonocytic leukemia, in remission: Secondary | ICD-10-CM | POA: Diagnosis not present

## 2018-03-18 DIAGNOSIS — I890 Lymphedema, not elsewhere classified: Secondary | ICD-10-CM | POA: Insufficient documentation

## 2018-03-18 DIAGNOSIS — M069 Rheumatoid arthritis, unspecified: Secondary | ICD-10-CM | POA: Diagnosis not present

## 2018-03-18 DIAGNOSIS — L97821 Non-pressure chronic ulcer of other part of left lower leg limited to breakdown of skin: Secondary | ICD-10-CM | POA: Insufficient documentation

## 2018-03-19 NOTE — Progress Notes (Addendum)
[name], [name] ([id number]) Visit Report for 03/18/2018 Chief Complaint Document Details Patient Name: [name], [name] Date of Service: 03/18/2018 3:00 PM Medical Record Number: [id number] Patient Account Number: [id number] Date of Birth/Sex: [birth year] (77 y.o. F) Treating RN: Ahmed Prima Primary Care Provider: Salome Holmes Other Clinician: Referring Provider: Salome Holmes Treating Provider/Extender: Cathie Olden in Treatment: 1 Information Obtained from: Patient Chief Complaint Bilateral LE Blisters/Ulcers Electronic Signature(s) Signed: 03/18/2018 3:42:16 PM By: Lawanda Cousins Entered By: Lawanda Cousins on 03/18/2018 15:42:15 [name] ([id number]) -------------------------------------------------------------------------------- HPI Details Patient Name: [name] Date of Service: 03/18/2018 3:00 PM Medical Record Number: [id number] Patient Account Number: [id number] Date of Birth/Sex: 1941/08/29 (77 y.o. F) Treating RN: Ahmed Prima Primary Care Provider: Salome Holmes Other Clinician: Referring Provider: Salome Holmes Treating Provider/Extender: Cathie Olden in Treatment: 1 History of Present Illness HPI Description: 03/05/18 on evaluation today patient presents for initial evaluation and our clinic due to significantly large blisters of the bilateral lower extremities which began initially on 02/18/18. 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 Secondarily it does appear that they felt she likely had cellulitis. Subsequently the patient was seen for fault evaluation on 03/03/18 and that is really when the cellulitis was diagnosed. She was actually placed on doxycycline 100 mg by mouth twice a day for 10 days. With that being said she was prescribed prednisone although she did not tolerate this. Apparently she had some alteration in her mental status with  taking this. 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 She has never smoked and unfortunately does have pain at the site where these blisters are located of the bilateral lower extremities. She also does appear to have significant lymphedema which obviously has been more chronic. 03/12/18 on evaluation today patient actually appears to be doing much better in regard to her bilateral [employer] ulcers. Unfortunately a lot of the alginate dressing did stick to her legs due to the fact that everything dried up rather rapidly in the compression seems to have done very well in that regard. Unfortunately the home health nurse last time she came out to the patient's home was not able to fully remove the alginate from her leg so she just rewrapped it with what was there continuing to be in place. Obviously that does need to come off but overall I'm extremely pleased with the progress it's been made over the past week which is the Kerlex and Coban wraps. 03/18/18-She is here in follow-up evaluation for bilateral lower extremity lymphedema with blister/ulceration. She is essentially healed to the right lower extremity with one residual pinpoint opening, the left lower extremity has multiple pinpoint openings with minimal amount of serous drainage. We will continue with compression over this next week. We have requested that home help initiate the process of ordering compression wraps for long-term management. She will follow-up next week, I anticipate she will be healed at that appointment Electronic Signature(s) Signed: 03/18/2018 3:45:39 PM By: Lawanda Cousins Entered By: Lawanda Cousins on 03/18/2018 15:45:39 [name] ([id number]) -------------------------------------------------------------------------------- Physician Orders Details Patient Name: [name] Date of Service: 03/18/2018 3:00 PM Medical Record Number: [id number] Patient  Account Number: [id number] Date of Birth/Sex: 02-17-1941 (77 y.o. F) Treating RN: Ahmed Prima Primary Care Provider: Salome Holmes Other Clinician: Referring Provider: Salome Holmes Treating Provider/Extender: Cathie Olden in  Treatment: 1 Verbal / Phone Orders: Yes Clinician: [place], Debi Read Back and Verified: Yes Diagnosis Coding Wound Cleansing Wound #1 Right,Lateral Lower Leg o Clean wound with Normal Saline. o Cleanse wound with mild soap and water Wound #2 Left,Lateral Lower Leg o Clean wound with Normal Saline. o Cleanse wound with mild soap and water Anesthetic (add to Medication List) Wound #1 Right,Lateral Lower Leg o Topical Lidocaine 4% cream applied to wound bed prior to debridement (In Clinic Only). Wound #2 Left,Lateral Lower Leg o Topical Lidocaine 4% cream applied to wound bed prior to debridement (In Clinic Only). Primary Wound Dressing Wound #2 Left,Lateral Lower Leg o Mepitel One Contact layer Secondary Dressing Wound #1 Right,Lateral Lower Leg o Other - kerramax Wound #2 Left,Lateral Lower Leg o Other - kerramax Dressing Change Frequency Wound #1 Right,Lateral Lower Leg o Change Dressing Monday, Wednesday, Friday Wound #2 Left,Lateral Lower Leg o Change Dressing Monday, Wednesday, Friday Follow-up Appointments Wound #1 Right,Lateral Lower Leg o Return Appointment in 1 week. Wound #2 Left,Lateral Lower Leg o Return Appointment in 1 week. Edema Control [name], Kristina PLAMONDON. ([id number]) Wound #1 Right,Lateral Lower Leg o Kerlix and Coban - Bilateral - unna to anchor o Elevate legs to the level of the heart and pump ankles as often as possible o Other: - HHRN to order compression wraps bilateral legs heel to knee- 44cm bilateral right ankle- 29 right calf- 35.5 left ankle- 31.3 left calf- 36 Wound #2 Left,Lateral Lower Leg o Kerlix and Coban - Bilateral - unna to anchor o Elevate legs to the level of  the heart and pump ankles as often as possible o Other: - HHRN to order compression wraps bilateral legs heel to knee- 44cm bilateral right ankle- 29 right calf- 35.5 left ankle- 31.3 left calf- 36 Additional Orders / Instructions Wound #1 Right,Lateral Lower Leg o Increase protein intake. Wound #2 Left,Lateral Lower Leg o Increase protein intake. Home Health Wound #1 Right,Lateral Lower Leg o [place] Visits - HHRN to order compression wraps bilateral legs heel to knee- 44cm bilateral right ankle- 29 right calf- 35.5 left ankle- 31.3 left calf- 36 o Home Health Nurse may visit PRN to address patientos wound care needs. o FACE TO FACE ENCOUNTER: MEDICARE and MEDICAID PATIENTS: I certify that this patient is under my care and that I had a face-to-face encounter that meets the physician face-to-face encounter requirements with this patient on this date. The encounter with the patient was in whole or in part for the following MEDICAL CONDITION: (primary reason for [place]) MEDICAL NECESSITY: I certify, that based on my findings, NURSING services are a medically necessary home health service. HOME BOUND STATUS: I certify that my clinical findings support that this patient is homebound (i.e., Due to illness or injury, pt requires aid of supportive devices such as crutches, cane, wheelchairs, walkers, the use of special transportation or the assistance of another person to leave their place of residence. There is a normal inability to leave the home and doing so requires considerable and taxing effort. Other absences are for medical reasons / religious services and are infrequent or of short duration when for other reasons). o If current dressing causes regression in wound condition, may D/C ordered dressing product/s and apply Normal Saline Moist Dressing daily until next [place] / Other MD appointment. [place] of  regression in wound condition at [phone]. o Please direct any NON-WOUND related issues/requests for orders to patient's Primary Care Physician Wound #  2 Left,Lateral Lower Leg o [place] Visits - HHRN to order compression wraps bilateral legs heel to knee- 44cm bilateral right ankle- 29 right calf- 35.5 [name], Kristina M. ([id number]) left ankle- 31.3 left calf- 36 o Home Health Nurse may visit PRN to address patientos wound care needs. o FACE TO FACE ENCOUNTER: MEDICARE and MEDICAID PATIENTS: I certify that this patient is under my care and that I had a face-to-face encounter that meets the physician face-to-face encounter requirements with this patient on this date. The encounter with the patient was in whole or in part for the following MEDICAL CONDITION: (primary reason for [place]) MEDICAL NECESSITY: I certify, that based on my findings, NURSING services are a medically necessary home health service. HOME BOUND STATUS: I certify that my clinical findings support that this patient is homebound (i.e., Due to illness or injury, pt requires aid of supportive devices such as crutches, cane, wheelchairs, walkers, the use of special transportation or the assistance of another person to leave their place of residence. There is a normal inability to leave the home and doing so requires considerable and taxing effort. Other absences are for medical reasons / religious services and are infrequent or of short duration when for other reasons). o If current dressing causes regression in wound condition, may D/C ordered dressing product/s and apply Normal Saline Moist Dressing daily until next [place] / Other MD appointment. [place] of regression in wound condition at [phone]. o Please direct any NON-WOUND related issues/requests for orders to patient's Primary Care Physician Patient Medications Allergies: no known  allergies Notifications Medication Indication Start End lidocaine DOSE 1 - topical 4 % cream - 1 cream topical Electronic Signature(s) Signed: 03/18/2018 4:23:13 PM By: Lawanda Cousins Signed: 03/18/2018 4:25:44 PM By: Alric Quan Entered By: Alric Quan on 03/18/2018 15:45:55 [place], [place]. ([id number]) -------------------------------------------------------------------------------- Prescription 03/18/2018 Patient Name: [name] Provider: Lawanda Cousins NP Date of Birth: 26-Nov-1940 NPI#: [id number] Sex: F DEA#: [id number] Phone #: [phone] License #: Patient Address: [place], [place] [address] [address], [place], [place] [address] [phone] Allergies no known allergies Medication Medication: Route: Strength: Form: lidocaine topical 4% cream Class: TOPICAL LOCAL ANESTHETICS Dose: Frequency / Time: Indication: 1 1 cream topical Number of Refills: Number of Units: 0 Generic Substitution: Start Date: End Date: Administered at Substitution Permitted Facility: Yes Time Administered: Time Discontinued: Note to Pharmacy: Signature(s): Date(s): Electronic Signature(s) Signed: 03/18/2018 4:23:13 PM By: Lawanda Cousins Signed: 03/18/2018 4:25:44 PM By: Alric Quan Entered By: Alric Quan on 03/18/2018 15:45:55 [name], Kristina M. ([id number]) [name], Kristina M. ([id number]) --------------------------------------------------------------------------------  Problem List Details Patient Name: [name] Date of Service: 03/18/2018 3:00 PM Medical Record Number: [id number] Patient Account Number: [id number] Date of Birth/Sex: 27-Feb-1941 (77 y.o. 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 F) Treating RN: Ahmed Prima Primary Care Provider: Salome Holmes Other Clinician: Referring Provider: Salome Holmes Treating Provider/Extender: Cathie Olden in Treatment: 1 Subjective Chief Complaint Information obtained from Patient Bilateral LE Blisters/Ulcers History of Present Illness (HPI) 03/05/18 on evaluation today patient presents for initial evaluation and our clinic due to significantly large blisters of the bilateral lower extremities which began initially on 02/18/18. 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 Secondarily it does appear that they felt she likely had cellulitis. Subsequently the patient was seen for fault evaluation on 03/03/18 and that is really when the cellulitis was diagnosed. She was actually placed on doxycycline 100 mg by mouth twice a day for 10 days. With that being said she  was prescribed prednisone although she did not tolerate this. Apparently she had some alteration in her mental status with taking this. 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 She has never smoked and unfortunately does have pain at the site where these blisters are located of the bilateral lower extremities. She also does appear to have significant lymphedema which obviously has been more chronic. 03/12/18 on evaluation today patient actually appears to be doing much better in regard to her bilateral [employer] ulcers. Unfortunately a lot of the alginate dressing did stick to her legs due to the fact that everything dried up rather rapidly in the compression seems to have done very well in that regard. Unfortunately the home health nurse last time she came out to the patient's home was not able to fully remove the alginate from her leg so she just rewrapped it with what was there continuing to be in place. Obviously that does need to come off but overall I'm extremely pleased with the progress it's been made over the past week which is the Kerlex and Coban wraps. 03/18/18-She is here in follow-up evaluation for bilateral lower extremity lymphedema with blister/ulceration. She is essentially healed to the right lower extremity with one residual pinpoint opening, the left lower extremity has multiple pinpoint openings with minimal amount of serous drainage. We will continue with compression over this next week. We have requested that home help initiate the process of ordering compression wraps for long-term management. She will follow-up next week, I anticipate she will be healed at that appointment Patient History Information obtained from Patient. Family History Cancer - Mother, Diabetes - Siblings, Hypertension - Siblings, Lung Disease - Siblings, No family history of Heart Disease, Hereditary Spherocytosis, Kidney Disease,  Seizures, Stroke, Thyroid Problems, Tuberculosis. Social History Never smoker, Marital Status - Widowed, Alcohol Use - Never, Drug Use - No History, Caffeine Use - Never. [name], [name] ([id number]) Objective Constitutional Vitals Time Taken: 3:00 PM, Height: 65 in, Weight: 143.8 lbs, BMI: 23.9, Temperature: 97.96 F, Pulse: 68 bpm, Respiratory Rate: 18 breaths/min, Blood Pressure: 150/71 mmHg. Integumentary (Hair, Skin) Wound #1 status is Open. Original cause of wound was Blister. The wound is located on the Right,Lateral Lower Leg. The wound measures 0.1cm length x 0.1cm width x 0.1cm depth; 0.008cm^2 area and 0.001cm^3 volume. There is Fat Layer (Subcutaneous  Tissue) Exposed exposed. There is no tunneling or undermining noted. There is a small amount of serous drainage noted. The wound margin is indistinct and nonvisible. There is small (1-33%) pink granulation within the wound bed. There is a small (1-33%) amount of necrotic tissue within the wound bed. The periwound skin appearance exhibited: Scarring, Dry/Scaly. Periwound temperature was noted as No Abnormality. Wound #2 status is Open. Original cause of wound was Blister. The wound is located on the Left,Lateral Lower Leg. The wound measures 9cm length x 11cm width x 0.1cm depth; 77.754cm^2 area and 7.775cm^3 volume. There is Fat Layer (Subcutaneous Tissue) Exposed exposed. There is no tunneling or undermining noted. There is a small amount of serous drainage noted. The wound margin is indistinct and nonvisible. There is medium (34-66%) pink granulation within the wound bed. There is a small (1-33%) amount of necrotic tissue within the wound bed including Adherent Slough. 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 Cleanse wound with mild soap and water Wound #2 Left,Lateral Lower Leg: Clean wound with Normal Saline. Cleanse wound with mild soap and water Anesthetic (add to Medication List): Wound #1 Right,Lateral Lower Leg: Topical Lidocaine 4% cream applied to wound bed prior to debridement (In Clinic Only). Wound #2 Left,Lateral Lower Leg: Topical Lidocaine 4% cream applied to wound bed prior to debridement (In Clinic Only). Primary Wound Dressing: Wound #2 Left,Lateral Lower Leg: Felan, Cameo M. ([id number]) Mepitel One Contact layer Secondary Dressing: Wound #1 Right,Lateral Lower Leg: Other - kerramax Wound #2 Left,Lateral Lower Leg: Other - kerramax Dressing Change Frequency: Wound #1 Right,Lateral Lower Leg: Change Dressing Monday, Wednesday, Friday Wound #2 Left,Lateral Lower Leg: Change Dressing Monday, Wednesday, Friday Follow-up Appointments: Wound #1 Right,Lateral Lower Leg: Return Appointment in 1 week. Wound #2 Left,Lateral Lower Leg: Return Appointment in 1 week. Edema Control: Wound #1 Right,Lateral Lower Leg: Kerlix and Coban - Bilateral - unna to anchor Elevate legs to the level of the heart and pump ankles as often as possible Other: - HHRN to order compression wraps bilateral legs heel to knee- 44cm bilateral right ankle- 29 right calf- 35.5 left ankle- 31.3 left calf- 36 Wound #2 Left,Lateral Lower Leg: Kerlix and Coban - Bilateral - unna to anchor Elevate legs to the level of the heart and pump ankles as often as possible Other: - HHRN to order compression wraps bilateral legs heel to knee- 44cm bilateral right ankle- 29 right calf- 35.5 left ankle- 31.3 left calf- 36 Additional Orders / Instructions: Wound #1 Right,Lateral Lower Leg: Increase protein intake. Wound #2 Left,Lateral Lower Leg: Increase protein intake. Home  Health: Wound #1 Right,Lateral Lower Leg: Continue Home Health Visits - HHRN to order compression wraps bilateral legs heel to knee- 44cm bilateral right ankle- 29 right calf- 35.5 left ankle- 31.3 left calf- 36 Home Health Nurse may visit PRN to address patient s wound care needs. FACE TO FACE ENCOUNTER: MEDICARE and MEDICAID PATIENTS: I certify that this patient is under my care and that I had a face-to-face encounter that meets the physician face-to-face encounter requirements with this patient on this date. The encounter with the patient was in whole or in part for the following MEDICAL CONDITION: (primary reason for [place]) MEDICAL  NECESSITY: I certify, that based on my findings, NURSING services are a medically necessary home health service. HOME BOUND STATUS: I certify that my clinical findings support that this patient is homebound (i.e., Due to illness or injury, pt requires aid of supportive devices such as crutches, cane, wheelchairs, walkers, the use of special transportation or the assistance of another person to leave their place of residence. There is a normal inability to leave the home and doing so requires considerable and taxing effort. Other absences are for medical reasons / religious services and are infrequent or of short duration when for other reasons). If current dressing causes regression in wound condition, may D/C ordered dressing product/s and apply Normal Saline Moist Dressing daily until next [place] / Other MD appointment. [place] of regression in wound condition at [phone]. Please direct any NON-WOUND related issues/requests for orders to patient's Primary Care Physician Wound #2 Left,Lateral Lower Leg: [place] Visits - HHRN to order compression wraps bilateral legs heel to knee- 44cm bilateral right ankle- 29 right calf- 35.5 left ankle- 31.3 left calf- 36 Home Health Nurse may visit PRN to address  patient s wound care needs. FACE TO FACE ENCOUNTER: MEDICARE and MEDICAID PATIENTS: I certify that this patient is under my care and that I had a face-to-face encounter that meets the physician face-to-face encounter requirements with this patient on this date. The encounter with the patient was in whole or in part for the following MEDICAL CONDITION: (primary reason for [place]) MEDICAL NECESSITY: I certify, that based on my findings, NURSING services are a medically necessary home health service. HOME BOUND STATUS: I certify that my clinical findings support that this patient is homebound (i.e., Due to illness or injury, pt requires aid of supportive devices such as crutches, cane, wheelchairs, walkers, the use of special [name], Kristina NUSSBAUMER. ([id number]) transportation or the assistance of another person to leave their place of residence. There is a normal inability to leave the home and doing so requires considerable and taxing effort. Other absences are for medical reasons / religious services and are infrequent or of short duration when for other reasons). If current dressing causes regression in wound condition, may D/C ordered dressing product/s and apply Normal Saline Moist Dressing daily until next [place] / Other MD appointment. [place] of regression in wound condition at [phone]. Please direct any NON-WOUND related issues/requests for orders to patient's Primary Care Physician The following medication(s) was prescribed: lidocaine topical 4 % cream 1 1 cream topical was prescribed at facility Electronic Signature(s) Signed: 03/18/2018 3:48:13 PM By: Lawanda Cousins Previous Signature: 03/18/2018 3:46:10 PM Version By: Lawanda Cousins Entered By: Lawanda Cousins on 03/18/2018 15:48:13 [name], [name] ([id number]) -------------------------------------------------------------------------------- ROS/PFSH Details Patient Name: [name] Date of  Service: 03/18/2018 3:00 PM Medical Record Number: [id number] Patient Account Number: [id number] Date of Birth/Sex: [birth year] (77 y.o. F) Treating RN: Ahmed Prima Primary Care Provider: Salome Holmes Other Clinician: Referring Provider: Salome Holmes Treating Provider/Extender: Cathie Olden in Treatment: 1 Information Obtained From Patient Wound History Do you currently have one or more open woundso Yes How many open wounds do you currently haveo 2 Approximately how long have you had your woundso 2 weeks How have you been treating your wound(s) until nowo gauze Has your wound(s) ever healed and then re-openedo No Have you had any lab work done in the past montho Yes Have you tested positive for an antibiotic resistant organism (  MRSA, VRE)o No Have you tested positive for osteomyelitis (bone infection)o No Have you had any tests for circulation on your legso No Have you had other problems associated with your woundso Infection Eyes Medical History: Negative for: Cataracts; Glaucoma; Optic Neuritis Ear/Nose/Mouth/Throat Medical History: Positive for: Chronic sinus problems/congestion - allergys seasonal Negative for: Middle ear problems Hematologic/Lymphatic Medical History: Positive for: Anemia; Lymphedema Negative for: Hemophilia; Human Immunodeficiency Virus; Sickle Cell Disease Respiratory Medical History: Negative for: Aspiration; Asthma; Chronic Obstructive Pulmonary Disease (COPD); Pneumothorax; Sleep Apnea; Tuberculosis Cardiovascular Medical History: Positive for: Angina; Arrhythmia - murmur; Hypertension Negative for: Congestive Heart Failure; Coronary Artery Disease; Deep Vein Thrombosis; Hypotension; Myocardial Infarction; Peripheral Arterial Disease; Peripheral Venous Disease; Phlebitis; Vasculitis Gastrointestinal Medical History: Negative for: Cirrhosis ; Colitis; Crohnos; Hepatitis A; Hepatitis B; Hepatitis C [name], Kristina M.  ([id number]) Endocrine Medical History: Negative for: Type I Diabetes; Type II Diabetes Genitourinary Medical History: Negative for: End Stage Renal Disease Immunological Medical History: Negative for: Lupus Erythematosus; Raynaudos; Scleroderma Integumentary (Skin) Medical History: Negative for: History of Burn; History of pressure wounds Musculoskeletal Medical History: Positive for: Gout; Rheumatoid Arthritis; Osteoarthritis Negative for: Osteomyelitis Neurologic Medical History: Negative for: Dementia; Neuropathy; Quadriplegia; Paraplegia; Seizure Disorder Oncologic Medical History: Negative for: Received Chemotherapy; Received Radiation Psychiatric Medical History: Negative for: Anorexia/bulimia; Confinement Anxiety HBO Extended History Items Ear/Nose/Mouth/Throat: Chronic sinus problems/congestion Immunizations Pneumococcal Vaccine: Received Pneumococcal Vaccination: No Implantable Devices Family and Social History Cancer: Yes - Mother; Diabetes: Yes - Siblings; Heart Disease: No; Hereditary Spherocytosis: No; Hypertension: Yes - Siblings; Kidney Disease: No; Lung Disease: Yes - Siblings; Seizures: No; Stroke: No; Thyroid Problems: No; Tuberculosis: No; Never smoker; Marital Status - Widowed; Alcohol Use: Never; Drug Use: No History; Caffeine Use: Never; Financial Concerns: No; Food, Clothing or Shelter Needs: No; Support System Lacking: No; Transportation Concerns: No; Advanced Directives: No; Patient does not want information on Advanced Directives; Do not resuscitate: No; Living Will: No; Medical Power of Attorney: No Physician Affirmation [name], [name] ([id number]) I have reviewed and agree with the above information. Electronic Signature(s) Signed: 03/18/2018 4:23:13 PM By: Lawanda Cousins Signed: 03/18/2018 4:25:44 PM By: Alric Quan Entered By: Lawanda Cousins on 03/18/2018 15:45:57 [place], [name]  ([id number]) -------------------------------------------------------------------------------- SuperBill Details Patient Name: [name] Date of Service: 03/18/2018 Medical Record Number: [id number] Patient Account Number: [id number] Date of Birth/Sex: 1941/08/27 (77 y.o. 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, in remission Facility Procedures CPT4 Code: 07371062 Description: 99214 - WOUND CARE VISIT-LEV 4 EST PT Modifier: Quantity: 1 Physician Procedures CPT4 Code Description: 6948546 27035 - WC PHYS LEVEL 3 - EST PT ICD-10 Diagnosis Description L97.821 Non-pressure chronic ulcer of other part of left lower leg limi L97.811 Non-pressure chronic ulcer of other part of right lower leg lim I89.0 Lymphedema,  not elsewhere classified Modifier: ted to breakdown ited to breakdown Quantity: 1 of skin of skin Electronic Signature(s) Signed: 03/18/2018 4:23:13 PM By: Lawanda Cousins Signed: 03/18/2018 4:25:44 PM By: Alric Quan Previous Signature: 03/18/2018 3:50:37 PM Version By: Lawanda Cousins Entered By: Alric Quan on 03/18/2018 16:22:42

## 2018-03-26 ENCOUNTER — Encounter: Payer: Medicare HMO | Admitting: Physician Assistant

## 2018-03-26 DIAGNOSIS — I890 Lymphedema, not elsewhere classified: Secondary | ICD-10-CM | POA: Diagnosis not present

## 2018-03-31 NOTE — Progress Notes (Signed)
KIKUE, GERHART ([id number]) Visit Report for 03/26/2018 Arrival Information Details Patient Name: [name], [name] Date of Service: 03/26/2018 10:00 AM Medical Record Number: [id number] Patient Account Number: [id number] Date of Birth/Sex: December 26, 1940 (77 y.o. F) Treating RN: Secundino Ginger Primary Care [place] Karl: Salome Holmes Other Clinician: Referring Eduardo Honor: Salome Holmes Treating Brendolyn Stockley/Extender: Melburn Hake, HOYT Weeks in Treatment: 3 Visit Information History Since Last Visit Added or deleted any medications: No Patient Arrived: Cane Any new allergies or adverse reactions: No Arrival Time: 10:28 Had a fall or experienced change in No Accompanied By: self activities of daily living that may affect Transfer Assistance: None risk of falls: Patient Identification Verified: Yes Signs or symptoms of abuse/neglect since last visito No Secondary Verification Process Completed: Yes Hospitalized since last visit: No Patient Requires Transmission-Based Precautions: No Implantable device outside of the clinic excluding No Patient Has Alerts: No cellular tissue based products placed in the center since last visit: Has Dressing in Place as Prescribed: Yes Has Compression in Place as Prescribed: Yes Pain Present Now: No Electronic Signature(s) Signed: 03/26/2018 3:41:29 PM By: Secundino Ginger Entered By: Secundino Ginger on 03/26/2018 10:29:13 Vick, [name] ([id number]) -------------------------------------------------------------------------------- Clinic Level of Care Assessment Details Patient Name: [name] Date of Service: 03/26/2018 10:00 AM Medical Record Number: [id number] Patient Account Number: [id number] Date of Birth/Sex: May 12, 1941 (77 y.o. F) Treating RN: Montey Hora Primary Care Brewer Hitchman: Salome Holmes Other Clinician: Referring Clella Mckeel: Salome Holmes Treating Leahann Lempke/Extender: Melburn Hake, HOYT Weeks in Treatment: 3 Clinic Level of Care Assessment Items TOOL 4 Quantity  Score []  - Use when only an EandM is performed on FOLLOW-UP visit 0 ASSESSMENTS - Nursing Assessment / Reassessment X - Reassessment of Co-morbidities (includes updates in patient status) 1 10 X- 1 5 Reassessment of Adherence to Treatment Plan ASSESSMENTS - Wound and Skin Assessment / Reassessment X - Simple Wound Assessment / Reassessment - one wound 1 5 []  - 0 Complex Wound Assessment / Reassessment - multiple wounds []  - 0 Dermatologic / Skin Assessment (not related to wound area) ASSESSMENTS - Focused Assessment X - Circumferential Edema Measurements - multi extremities 2 5 []  - 0 Nutritional Assessment / Counseling / Intervention X- 1 5 Lower Extremity Assessment (monofilament, tuning fork, pulses) []  - 0 Peripheral Arterial Disease Assessment (using hand held doppler) ASSESSMENTS - Ostomy and/or Continence Assessment and Care []  - Incontinence Assessment and Management 0 []  - 0 Ostomy Care Assessment and Management (repouching, etc.) PROCESS - Coordination of Care X - Simple Patient / Family Education for ongoing care 1 15 []  - 0 Complex (extensive) Patient / Family Education for ongoing care []  - 0 Staff obtains Programmer, systems, Records, Test Results / Process Orders []  - 0 Staff telephones HHA, Nursing Homes / Clarify orders / etc []  - 0 Routine Transfer to another Facility (non-emergent condition) []  - 0 Routine Hospital Admission (non-emergent condition) []  - 0 New Admissions / Biomedical engineer / Ordering NPWT, Apligraf, etc. []  - 0 Emergency Hospital Admission (emergent condition) X- 1 10 Simple Discharge Coordination Denicola, Libbey M. ([id number]) []  - 0 Complex (extensive) Discharge Coordination PROCESS - Special Needs []  - Pediatric / Minor Patient Management 0 []  - 0 Isolation Patient Management []  - 0 Hearing / Language / Visual special needs []  - 0 Assessment of Community assistance (transportation, D/C planning, etc.) []  - 0 Additional assistance  / Altered mentation []  - 0 Support Surface(s) Assessment (bed, cushion, seat, etc.) INTERVENTIONS - Wound Cleansing / Measurement X - Simple Wound Cleansing - one  wound 1 5 []  - 0 Complex Wound Cleansing - multiple wounds X- 1 5 Wound Imaging (photographs - any number of wounds) []  - 0 Wound Tracing (instead of photographs) X- 1 5 Simple Wound Measurement - one wound []  - 0 Complex Wound Measurement - multiple wounds INTERVENTIONS - Wound Dressings []  - Small Wound Dressing one or multiple wounds 0 []  - 0 Medium Wound Dressing one or multiple wounds X- 2 20 Large Wound Dressing one or multiple wounds []  - 0 Application of Medications - topical []  - 0 Application of Medications - injection INTERVENTIONS - Miscellaneous []  - External ear exam 0 []  - 0 Specimen Collection (cultures, biopsies, blood, body fluids, etc.) []  - 0 Specimen(s) / Culture(s) sent or taken to Lab for analysis []  - 0 Patient Transfer (multiple staff / Civil Service fast streamer / Similar devices) []  - 0 Simple Staple / Suture removal (25 or less) []  - 0 Complex Staple / Suture removal (26 or more) []  - 0 Hypo / Hyperglycemic Management (close monitor of Blood Glucose) []  - 0 Ankle / Brachial Index (ABI) - do not check if billed separately X- 1 5 Vital Signs Szwed, Claretta M. ([id number]) Has the patient been seen at the hospital within the last three years: Yes Total Score: 120 Level Of Care: New/Established - Level 4 Electronic Signature(s) Signed: 03/26/2018 4:47:18 PM By: Montey Hora Entered By: Montey Hora on 03/26/2018 11:05:20 Cutter, [name] ([id number]) -------------------------------------------------------------------------------- Encounter Discharge Information Details Patient Name: [name] Date of Service: 03/26/2018 10:00 AM Medical Record Number: [id number] Patient Account Number: [id number] Date of Birth/Sex: 03-13-1941 (77 y.o. F) Treating RN: Ahmed Prima Primary Care Ebonique Hallstrom:  Salome Holmes Other Clinician: Referring Amandalynn Pitz: Salome Holmes Treating Hector Taft/Extender: Melburn Hake, HOYT Weeks in Treatment: 3 Encounter Discharge Information Items Discharge Condition: Stable Ambulatory Status: Ambulatory Discharge Destination: Home Transportation: Private Auto Accompanied By: self Schedule Follow-up Appointment: Yes Clinical Summary of Care: Electronic Signature(s) Signed: 03/26/2018 11:49:23 AM By: Alric Quan Entered By: Alric Quan on 03/26/2018 11:49:23 Iser, [name] ([id number]) -------------------------------------------------------------------------------- Lower Extremity Assessment Details Patient Name: [name] Date of Service: 03/26/2018 10:00 AM Medical Record Number: [id number] Patient Account Number: [id number] Date of Birth/Sex: [birth year] (77 y.o. F) Treating RN: Secundino Ginger Primary Care Satonya Lux: Salome Holmes Other Clinician: Referring Mubashir Mallek: Salome Holmes Treating Chasen Mendell/Extender: Melburn Hake, HOYT Weeks in Treatment: 3 Edema Assessment Assessed: [Left: No] [Right: No] [Left: Edema] [Right: :] Calf Left: Right: Point of Measurement: 35 cm From Medial Instep 35 cm 36 cm Ankle Left: Right: Point of Measurement: 12 cm From Medial Instep 28.5 cm 25.5 cm Vascular Assessment Claudication: Claudication Assessment [Left:None] [Right:None] Pulses: Dorsalis Pedis Palpable: [Left:Yes] [Right:Yes] Posterior Tibial Extremity colors, hair growth, and conditions: Extremity Color: [Left:Hyperpigmented] [Right:Hyperpigmented] Temperature of Extremity: [Left:Cool] [Right:Cool] Capillary Refill: [Left:< 3 seconds] [Right:< 3 seconds] Toe Nail Assessment Left: Right: Thick: Yes Yes Discolored: Yes Yes Deformed: Yes Yes Improper Length and Hygiene: Yes Yes Electronic Signature(s) Signed: 03/26/2018 3:41:29 PM By: Secundino Ginger Entered By: Secundino Ginger on 03/26/2018 10:46:10 Lundeen, [name]  ([id number]) -------------------------------------------------------------------------------- Multi Wound Chart Details Patient Name: [name] Date of Service: 03/26/2018 10:00 AM Medical Record Number: [id number] Patient Account Number: [id number] Date of Birth/Sex: 09-20-1941 (77 y.o. F) Treating RN: Montey Hora Primary Care [place] Maulden: Salome Holmes Other Clinician: Referring Rayquon Uselman: Salome Holmes Treating Juris Gosnell/Extender: Melburn Hake, HOYT Weeks in Treatment: 3 Vital Signs Height(in): 65 Pulse(bpm): 61 Weight(lbs): 143.8 Blood Pressure(mmHg): 183/80 Body Mass Index(BMI): 24 Temperature(F): 98 Respiratory Rate 18 (  breaths/min): Photos: [N/A:N/A] Wound Location: Right Lower Leg - Lateral Left Lower Leg - Lateral N/A Wounding Event: Blister Blister N/A Primary Etiology: Lymphedema Lymphedema N/A Comorbid History: Chronic sinus Chronic sinus N/A problems/congestion, Anemia, problems/congestion, Anemia, Lymphedema, Angina, Lymphedema, Angina, Arrhythmia, Hypertension, Arrhythmia, Hypertension, Gout, Rheumatoid Arthritis, Gout, Rheumatoid Arthritis, Osteoarthritis Osteoarthritis Date Acquired: 02/19/2018 02/19/2018 N/A Weeks of Treatment: 3 3 N/A Wound Status: Healed - Epithelialized Open N/A Measurements L x W x D 0x0x0 6x8x0.1 N/A (cm) Area (cm) : 0 37.699 N/A Volume (cm) : 0 3.77 N/A % Reduction in Area: 100.00% 80.50% N/A % Reduction in Volume: 100.00% 90.30% N/A Classification: Full Thickness Without Full Thickness Without N/A Exposed Support Structures Exposed Support Structures Exudate Amount: N/A Small N/A Exudate Type: N/A Serous N/A Exudate Color: N/A amber N/A Wound Margin: N/A Indistinct, nonvisible N/A Granulation Amount: N/A Medium (34-66%) N/A Granulation Quality: N/A Pink N/A Necrotic Amount: N/A Small (1-33%) N/A Periwound Skin Texture: No Abnormalities Noted Scarring: Yes N/A Periwound Skin Moisture: No Abnormalities Noted No  Abnormalities Noted N/A Meath, Wendi M. ([id number]) Periwound Skin Color: No Abnormalities Noted Hemosiderin Staining: Yes N/A Temperature: N/A No Abnormality N/A Tenderness on Palpation: No No N/A Wound Preparation: N/A Ulcer Cleansing: Other: normal N/A saline Treatment Notes Electronic Signature(s) Signed: 03/26/2018 4:47:18 PM By: Montey Hora Entered By: Montey Hora on 03/26/2018 11:01:04 Seckman, [name] ([id number]) -------------------------------------------------------------------------------- [place] Details Patient Name: [name] Date of Service: 03/26/2018 10:00 AM Medical Record Number: [id number] Patient Account Number: [id number] Date of Birth/Sex: 07-22-1941 (77 y.o. F) Treating RN: Montey Hora Primary Care Aysiah Jurado: Salome Holmes Other Clinician: Referring Fremon Zacharia: Salome Holmes Treating Keira Bohlin/Extender: Melburn Hake, HOYT Weeks in Treatment: 3 Active Inactive ` Abuse / Safety / Falls / Self Care Management Nursing Diagnoses: Impaired physical mobility Goals: Patient will not experience any injury related to falls Date Initiated: 03/05/2018 Target Resolution Date: 05/22/2018 Goal Status: Active Interventions: Assess fall risk on admission and as needed Notes: ` Orientation to the Wound Care Program Nursing Diagnoses: Knowledge deficit related to the wound healing center program Goals: Patient/caregiver will verbalize understanding of the [facility] Date Initiated: 03/05/2018 Target Resolution Date: 05/22/2018 Goal Status: Active Interventions: Provide education on orientation to the wound center Notes: ` Wound/Skin Impairment Nursing Diagnoses: Impaired tissue integrity Goals: Ulcer/skin breakdown will heal within 14 weeks Date Initiated: 03/05/2018 Target Resolution Date: 05/22/2018 Goal Status: Active Interventions: AANIKA, DEFOOR ([id number]) Assess patient/caregiver ability to obtain  necessary supplies Assess patient/caregiver ability to perform ulcer/skin care regimen upon admission and as needed Assess ulceration(s) every visit Notes: Electronic Signature(s) Signed: 03/26/2018 4:47:18 PM By: Montey Hora Entered By: Montey Hora on 03/26/2018 11:00:54 Goffe, [name] ([id number]) -------------------------------------------------------------------------------- Pain Assessment Details Patient Name: [name] Date of Service: 03/26/2018 10:00 AM Medical Record Number: [id number] Patient Account Number: [id number] Date of Birth/Sex: 06-Feb-1941 (77 y.o. F) Treating RN: Secundino Ginger Primary Care Jalene Demo: Salome Holmes Other Clinician: Referring Dreyson Mishkin: Salome Holmes Treating Zorion Nims/Extender: Melburn Hake, HOYT Weeks in Treatment: 3 Active Problems Location of Pain Severity and Description of Pain Patient Has Paino No Site Locations Pain Management and Medication Current Pain Management: Goals for Pain Management Topical or injectable lidocaine is offered to patient for acute pain when surgical debridement is performed. If needed, Patient is instructed to use over the counter pain medication for the following 24-48 hours after debridement. Wound care MDs do not prescribed pain medications. Patient has chronic pain or uncontrolled pain. Patient has been instructed to make  an appointment with their Primary Care Physician for pain management. Electronic Signature(s) Signed: 03/26/2018 3:41:29 PM By: Secundino Ginger Entered By: Secundino Ginger on 03/26/2018 10:29:55 Asencio, [name] ([id number]) -------------------------------------------------------------------------------- Patient/Caregiver Education Details Patient Name: [name] Date of Service: 03/26/2018 10:00 AM Medical Record Number: [id number] Patient Account Number: [id number] Date of Birth/Gender: 29-Jan-1941 (77 y.o. F) Treating RN: Ahmed Prima Primary Care Physician: Salome Holmes Other  Clinician: Referring Physician: Salome Holmes Treating Physician/Extender: Sharalyn Ink in Treatment: 3 Education Assessment Education Provided To: Patient Education Topics Provided Wound/Skin Impairment: Handouts: Caring for Your Ulcer, Skin Care Do's and Dont's, Other: change dressings as ordered Methods: Demonstration, Explain/Verbal Responses: State content correctly Electronic Signature(s) Signed: 03/29/2018 5:21:58 PM By: Alric Quan Entered By: Alric Quan on 03/26/2018 11:49:41 [place], [name] ([id number]) -------------------------------------------------------------------------------- Wound Assessment Details Patient Name: [name] Date of Service: 03/26/2018 10:00 AM Medical Record Number: [id number] Patient Account Number: [id number] Date of Birth/Sex: Aug 06, 1941 (77 y.o. F) Treating RN: Secundino Ginger Primary Care Claborn Janusz: Salome Holmes Other Clinician: Referring Dak Szumski: Salome Holmes Treating Gurjot Brisco/Extender: Melburn Hake, HOYT Weeks in Treatment: 3 Wound Status Wound Number: 1 Primary Lymphedema Etiology: Wound Location: Right Lower Leg - Lateral Wound Healed - Epithelialized Wounding Event: Blister Status: Date Acquired: 02/19/2018 Comorbid Chronic sinus problems/congestion, Anemia, Weeks Of Treatment: 3 History: Lymphedema, Angina, Arrhythmia, Hypertension, Clustered Wound: No Gout, Rheumatoid Arthritis, Osteoarthritis Photos Photo Uploaded By: Secundino Ginger on 03/26/2018 10:51:40 Wound Measurements Length: (cm) 0 Width: (cm) 0 Depth: (cm) 0 Area: (cm) 0 Volume: (cm) 0 % Reduction in Area: 100% % Reduction in Volume: 100% Wound Description Full Thickness Without Exposed Support Classification: Structures Periwound Skin Texture Texture Color No Abnormalities Noted: No No Abnormalities Noted: No Moisture No Abnormalities Noted: No Electronic Signature(s) Signed: 03/26/2018 3:41:29 PM By: Secundino Ginger Entered By: Secundino Ginger on  03/26/2018 10:40:54 Randhawa, [name] ([id number]) -------------------------------------------------------------------------------- Wound Assessment Details Patient Name: [name] Date of Service: 03/26/2018 10:00 AM Medical Record Number: [id number] Patient Account Number: [id number] Date of Birth/Sex: [birth year] (77 y.o. F) Treating RN: Secundino Ginger Primary Care Treyvon Blahut: Salome Holmes Other Clinician: Referring Jamyrah Saur: Salome Holmes Treating Tashya Alberty/Extender: Melburn Hake, HOYT Weeks in Treatment: 3 Wound Status Wound Number: 2 Primary Lymphedema Etiology: Wound Location: Left Lower Leg - Lateral Wound Open Wounding Event: Blister Status: Date Acquired: 02/19/2018 Comorbid Chronic sinus problems/congestion, Anemia, Weeks Of Treatment: 3 History: Lymphedema, Angina, Arrhythmia, Hypertension, Clustered Wound: No Gout, Rheumatoid Arthritis, Osteoarthritis Photos Photo Uploaded By: Secundino Ginger on 03/26/2018 10:52:06 Wound Measurements Length: (cm) 6 Width: (cm) 8 Depth: (cm) 0.1 Area: (cm) 37.699 Volume: (cm) 3.77 % Reduction in Area: 80.5% % Reduction in Volume: 90.3% Tunneling: No Undermining: No Wound Description Full Thickness Without Exposed Support Classification: Structures Wound Margin: Indistinct, nonvisible Exudate Small Amount: Exudate Type: Serous Exudate Color: amber Foul Odor After Cleansing: No Slough/Fibrino Yes Wound Bed Granulation Amount: Medium (34-66%) Exposed Structure Granulation Quality: Pink Fat Layer (Subcutaneous Tissue) Exposed: Yes Necrotic Amount: Small (1-33%) Necrotic Quality: Adherent Slough Periwound Skin Texture Texture Color Wah, Veva M. ([id number]) No Abnormalities Noted: No No Abnormalities Noted: No Scarring: Yes Hemosiderin Staining: Yes Moisture Temperature / Pain No Abnormalities Noted: No Temperature: No Abnormality Wound Preparation Ulcer Cleansing: Other: normal saline, Treatment Notes Wound #2  (Left, Lateral Lower Leg) 1. Cleansed with: Clean wound with Normal Saline 2. Anesthetic Topical Lidocaine 4% cream to wound bed prior to debridement 4. Dressing Applied: Other dressing (specify in notes) 5. Secondary Dressing Applied ABD Pad Kerlix/Conform  7. Secured with Tape Notes unna to anchor, kerlix, coban, mepitel Electronic Signature(s) Signed: 03/26/2018 3:41:29 PM By: Secundino Ginger Entered By: Secundino Ginger on 03/26/2018 10:43:50 Bellis, [name] ([id number]) -------------------------------------------------------------------------------- Vitals Details Patient Name: [name] Date of Service: 03/26/2018 10:00 AM Medical Record Number: [id number] Patient Account Number: [id number] Date of Birth/Sex: 09/02/1941 (77 y.o. F) Treating RN: Secundino Ginger Primary Care [place] Russey: Salome Holmes Other Clinician: Referring Lauriana Denes: Salome Holmes Treating Judy Pollman/Extender: Melburn Hake, HOYT Weeks in Treatment: 3 Vital Signs Time Taken: 10:30 Temperature (F): 98 Height (in): 65 Pulse (bpm): 61 Weight (lbs): 143.8 Respiratory Rate (breaths/min): 18 Body Mass Index (BMI): 23.9 Blood Pressure (mmHg): 183/80 Reference Range: 80 - 120 mg / dl Notes pt bp 183/80 stated she was trying to hurry to her appointment and believe this is the reason. PA made aware Electronic Signature(s) Signed: 03/26/2018 3:41:29 PM By: Secundino Ginger Entered By: Secundino Ginger on 03/26/2018 10:31:49

## 2018-03-31 NOTE — Progress Notes (Signed)
[name], [name] ([id number]) Visit Report for 03/26/2018 Chief Complaint Document Details Patient Name: [name], [name] Date of Service: 03/26/2018 10:00 AM Medical Record Number: [id number] Patient Account Number: [id number] Date of Birth/Sex: 10/06/1941 (77 y.o. F) Treating RN: Montey Hora Primary Care Provider: Salome Holmes Other Clinician: Referring Provider: Salome Holmes Treating Provider/Extender: Melburn Hake, HOYT Weeks in Treatment: 3 Information Obtained from: Patient Chief Complaint Bilateral LE Blisters/Ulcers Electronic Signature(s) Signed: 03/28/2018 11:18:14 PM By: Worthy Keeler PA-C Entered By: Worthy Keeler on 03/26/2018 10:23:29 [place], [name] ([id number]) -------------------------------------------------------------------------------- HPI Details Patient Name: [name] Date of Service: 03/26/2018 10:00 AM Medical Record Number: [id number] Patient Account Number: [id number] Date of Birth/Sex: [birth year] (77 y.o. F) Treating RN: Montey Hora Primary Care Provider: Salome Holmes Other Clinician: Referring Provider: Salome Holmes Treating Provider/Extender: Melburn Hake, HOYT Weeks in Treatment: 3 History of Present Illness HPI Description: 03/05/18 on evaluation today patient presents for initial evaluation and our clinic due to significantly large blisters of the bilateral lower extremities which began initially on 02/18/18. 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 Secondarily it does appear that they felt she likely had cellulitis. Subsequently the patient was seen for fault evaluation on 03/03/18 and that is really when the cellulitis was diagnosed. She was actually placed on doxycycline 100 mg by mouth twice a day for 10 days. With that being said she was prescribed prednisone although she did not tolerate this. Apparently she had some alteration in her  mental status with taking this. 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 She has never smoked and unfortunately does have pain at the site where these blisters are located of the bilateral lower extremities. She also does appear to have significant lymphedema which obviously has been more chronic. 03/12/18 on evaluation today patient actually appears to be doing much better in regard to her bilateral [employer] ulcers. Unfortunately a lot of the alginate dressing did stick to her legs due to the fact that everything dried up rather rapidly in the compression seems to have done very well in that regard. Unfortunately the home health nurse last time she came out to the patient's home was not able to fully remove the alginate from her leg so she just rewrapped it with what was there continuing to be in place. Obviously that does need to come off but overall I'm extremely pleased with the progress it's been made over the past week which is the Kerlex and Coban wraps. 03/18/18-She is here in follow-up evaluation for bilateral lower extremity lymphedema with blister/ulceration. She is essentially healed to the right lower extremity with one residual pinpoint opening, the left lower extremity has multiple pinpoint openings with minimal amount of serous drainage. We will continue with compression over this next week. We have requested that home help initiate the process of ordering compression wraps for long-term management. She will follow-up next week, I anticipate she will be healed at that appointment 03/26/18 on evaluation today patient appears to be doing extremely well in regard to her bilateral lower extremity ulcers. This has completely resolved for the most part on the left lower extremity and has completely resolved in regard to the right lower extremity. She's been tolerating the dressing changes without complication including the  wraps. Overall her swelling is down significantly and I think this accounts for  why she is doing so much better at this time. Overall she is very pleased and having very little discomfort compared to previous. Electronic Signature(s) Signed: 03/28/2018 11:18:14 PM By: Worthy Keeler PA-C Entered By: Worthy Keeler on 03/28/2018 23:09:53 [place], [name] ([id number]) -------------------------------------------------------------------------------- Physical Exam Details Patient Name: [name] Date of Service: 03/26/2018 10:00 AM Medical Record Number: [id number] Patient Account Number: [id number] Date of Birth/Sex: 1941-04-22 (77 y.o. F) Treating RN: Montey Hora Primary Care Provider: Salome Holmes Other Clinician: Referring Provider: Salome Holmes Treating Provider/Extender: Melburn Hake, HOYT Weeks in Treatment: 3 Constitutional Obese and well-hydrated in no acute distress. Respiratory normal breathing without difficulty. clear to auscultation bilaterally. Cardiovascular regular rate and rhythm with normal S1, S2. Psychiatric this patient is able to make decisions and demonstrates good insight into disease process. Alert and Oriented x 3. pleasant and cooperative. Notes Patient's wounds on the left lower extremity are scattered and very superficial and minimal compared to what she did have. Overall she has made excellent progress I'm extremely pleased. There is no need for sharp debridement today. Electronic Signature(s) Signed: 03/28/2018 11:18:14 PM By: Worthy Keeler PA-C Entered By: Worthy Keeler on 03/28/2018 23:11:03 [name], [name] ([id number]) -------------------------------------------------------------------------------- Physician Orders Details Patient Name: [name] Date of Service: 03/26/2018 10:00 AM Medical Record Number: [id number] Patient Account Number: [id number] Date of Birth/Sex: [birth year] (77 y.o. 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 o Cleanse wound with mild soap and water Primary Wound Dressing Wound #2 Left,Lateral Lower Leg o Mepitel One Contact layer Secondary Dressing Wound #2 Left,Lateral Lower Leg o ABD pad Dressing Change Frequency Wound #2 Left,Lateral Lower Leg o Change Dressing Monday, Wednesday, Friday Follow-up Appointments Wound #2 Left,Lateral Lower Leg o Return Appointment in 1 week. Edema Control Wound #2 Left,Lateral Lower Leg o Kerlix and Coban - Bilateral - unna to anchor o Elevate legs to the level of the heart and pump ankles as often as possible o Other: - HHRN to order compression wraps bilateral legs heel to knee- 44cm bilateral right ankle- 29 right calf- 35.5 left ankle- 31.3 left calf- 36 Additional Orders / Instructions Wound #2 Left,Lateral Lower Leg Siler, Alvis M. ([id number]) o Increase protein intake. Home Health Wound #2 Left,Lateral Lower Leg o Continue Home Health Visits - HHRN to order compression wraps bilateral legs heel to knee- 44cm bilateral right ankle- 29 right calf- 35.5 left ankle- 31.3 left calf- 36 o Home Health Nurse may visit PRN to address patientos wound care needs. o FACE TO FACE ENCOUNTER: MEDICARE and MEDICAID PATIENTS: I certify that this patient is under my care and that I had a face-to-face encounter that meets the physician  face-to-face encounter requirements with this patient on this date. The encounter with the patient was in whole or in part for the following MEDICAL CONDITION: (primary reason for [place]) MEDICAL NECESSITY: I certify, that based on my findings, NURSING services are a medically necessary home health service. HOME BOUND STATUS: I certify  that my clinical findings support that this patient is homebound (i.e., Due to illness or injury, pt requires aid of supportive devices such as crutches, cane, wheelchairs, walkers, the use of special transportation or the assistance of another person to leave their place of residence. There is a normal inability to leave the home and doing so requires considerable and taxing effort. Other absences are for medical reasons / religious services and are infrequent or of short duration when for other reasons). o If current dressing causes regression in wound condition, may D/C ordered dressing product/s and apply Normal Saline Moist Dressing daily until next [place] / Other MD appointment. [place] of regression in wound condition at [phone]. o Please direct any NON-WOUND related issues/requests for orders to patient's Primary Care Physician Electronic Signature(s) Signed: 03/26/2018 4:47:18 PM By: Montey Hora Signed: 03/28/2018 11:18:14 PM By: Worthy Keeler PA-C Entered By: Montey Hora on 03/26/2018 11:04:46 [name], [name] ([id number]) -------------------------------------------------------------------------------- Problem List Details Patient Name: [name] Date of Service: 03/26/2018 10:00 AM Medical Record Number: [id number] Patient Account Number: [id number] Date of Birth/Sex: [birth year] (77 y.o. 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 F) Treating RN: Montey Hora Primary Care Provider: Salome Holmes Other Clinician: Referring Provider: Salome Holmes Treating Provider/Extender: Melburn Hake, HOYT Weeks in Treatment: 3 Subjective Chief Complaint Information obtained from Patient Bilateral LE Blisters/Ulcers History of Present Illness (HPI) 03/05/18 on evaluation today patient presents for initial evaluation and our clinic due to significantly large blisters of the bilateral lower extremities which began initially on 02/18/18. 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 Secondarily it does appear that they felt she likely had cellulitis. Subsequently the patient was seen for fault evaluation on 03/03/18 and that is really when the cellulitis was diagnosed. She was actually placed on doxycycline 100 mg by mouth twice a day for 10 days. With that being said she was prescribed prednisone although she did not tolerate this.  Apparently she had some alteration in her mental status with taking this. 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 She has never smoked and unfortunately does have pain at the site where these blisters are located of the bilateral lower extremities. She also does appear to have significant lymphedema which obviously has been more chronic. 03/12/18 on evaluation today patient actually appears to be doing much better in regard to her bilateral [employer] ulcers. Unfortunately a lot of the alginate dressing did stick to her legs due to the fact that everything  dried up rather rapidly in the compression seems to have done very well in that regard. Unfortunately the home health nurse last time she came out to the patient's home was not able to fully remove the alginate from her leg so she just rewrapped it with what was there continuing to be in place. Obviously that does need to come off but overall I'm extremely pleased with the progress it's been made over the past week which is the Kerlex and Coban wraps. 03/18/18-She is here in follow-up evaluation for bilateral lower extremity lymphedema with blister/ulceration. She is essentially healed to the right lower extremity with one residual pinpoint opening, the left lower extremity has multiple pinpoint openings with minimal amount of serous drainage. We will continue with compression over this next week. We have requested that home help initiate the process of ordering compression wraps for long-term management. She will follow-up next week, I anticipate she will be healed at that appointment 03/26/18 on evaluation today patient appears to be doing extremely well in regard to her bilateral lower extremity ulcers. This has completely resolved for the most part on the left lower extremity and has completely resolved in regard to the right lower extremity. She's been tolerating the dressing  changes without complication including the wraps. Overall her swelling is down significantly and I think this accounts for why she is doing so much better at this time. Overall she is very pleased and having very little discomfort compared to previous. Patient History Information obtained from Patient. Family History Cancer - Mother, Diabetes - Siblings, Hypertension - Siblings, Lung Disease - Siblings, No family history of Heart Disease, Hereditary Spherocytosis, Kidney Disease, Seizures, Stroke, Thyroid Problems, Tuberculosis. Social History Never smoker, Marital Status - Widowed, Alcohol Use - Never, Drug Use - No History, Caffeine Use - Never. [name], [name] ([id number]) Review of Systems (ROS) Constitutional Symptoms (General Health) Denies complaints or symptoms of Fever, Chills. Respiratory The patient has no complaints or symptoms. Cardiovascular Complains or has symptoms of LE edema. Psychiatric The patient has no complaints or symptoms. Objective Constitutional Obese and well-hydrated in no acute distress. Vitals Time Taken: 10:30 AM, Height: 65 in, Weight: 143.8 lbs, BMI: 23.9, Temperature: 98 F, Pulse: 61 bpm, Respiratory Rate: 18 breaths/min, Blood Pressure: 183/80 mmHg. General Notes: pt bp 183/80 stated she was trying to hurry to her appointment and believe this is the reason. PA made aware Respiratory normal breathing without difficulty. clear to auscultation bilaterally. Cardiovascular regular rate and rhythm with normal S1, S2. Psychiatric this patient is able to make decisions and demonstrates good insight into disease process. Alert and Oriented x 3. pleasant and cooperative. General Notes: Patient's wounds on the left lower extremity are scattered and very superficial and minimal compared to what she did have. Overall she has made excellent progress I'm extremely pleased. There is no need for sharp debridement today. Integumentary (Hair, Skin) Wound #1  status is Healed - Epithelialized. Original cause of wound was Blister. The wound is located on the Right,Lateral Lower Leg. The wound measures 0cm length x 0cm width x 0cm depth; 0cm^2 area and 0cm^3 volume. Wound #2 status is Open. Original cause of wound was Blister. The wound is located on the Left,Lateral Lower Leg. The wound measures 6cm length x 8cm width x 0.1cm depth; 37.699cm^2 area and 3.77cm^3 volume. There is Fat Layer (Subcutaneous Tissue) Exposed exposed. There is no tunneling or undermining noted. There is a small amount of serous drainage noted. The wound margin is indistinct  and nonvisible. There is medium (34-66%) pink granulation within the wound bed. There is a small (1-33%) amount of necrotic tissue within the wound bed including Adherent Slough. The periwound skin appearance exhibited: Scarring, Hemosiderin Staining. Periwound temperature was noted as No Abnormality. 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 Cleanse wound with mild soap and water Primary Wound Dressing: Wound #2 Left,Lateral Lower Leg: Mepitel One Contact layer Secondary Dressing: Wound #2 Left,Lateral Lower Leg: ABD pad Dressing Change Frequency: Wound #2 Left,Lateral Lower Leg: Change Dressing Monday, Wednesday, Friday Follow-up Appointments: Wound #2 Left,Lateral Lower Leg: Return Appointment in 1 week. Edema Control: Wound #2 Left,Lateral Lower Leg: Kerlix and Coban - Bilateral - unna to anchor Elevate legs to the level of the heart and pump ankles as often as possible Other: - HHRN to order compression wraps bilateral legs  heel to knee- 44cm bilateral right ankle- 29 right calf- 35.5 left ankle- 31.3 left calf- 36 Additional Orders / Instructions: Wound #2 Left,Lateral Lower Leg: Increase protein intake. Home Health: Wound #2 Left,Lateral Lower Leg: Continue Home Health Visits - HHRN to order compression wraps bilateral legs heel to knee- 44cm bilateral right ankle- 29 right calf- 35.5 left ankle- 31.3 left calf- 36 Home Health Nurse may visit PRN to address patient s wound care needs. FACE TO FACE ENCOUNTER: MEDICARE and MEDICAID PATIENTS: I certify that this patient is under my care and that I had a face-to-face encounter that meets the physician face-to-face encounter requirements with this patient on this date. The encounter with the patient was in whole or in part for the following MEDICAL CONDITION: (primary reason for [place]) MEDICAL NECESSITY: I certify, that based on my findings, NURSING services are a medically necessary home health service. HOME BOUND STATUS: I certify that my clinical findings support that this patient is homebound (i.e., Due to illness or injury, pt requires aid of supportive devices such as crutches, cane, wheelchairs, walkers, the use of special transportation or the assistance of another person to leave their place of residence. There is a normal inability to leave the home and doing so requires considerable and taxing effort. Other absences are for medical reasons / religious services and are infrequent or of short duration when for other reasons). If current dressing causes regression in wound condition, may D/C ordered dressing product/s and apply Normal Saline Moist Dressing daily until next [place] / Other MD appointment. [place] of regression in wound condition at [phone]. Please direct any NON-WOUND related issues/requests for orders to patient's Primary Care Physician [name], [name] ([id number]) Yetta Flock suggest currently  that we continue with the Current wound care measures for the next week. She's in agreement with plan. We will subsequently see were things stand at follow-up. Please see above for specific wound care orders. We will see patient for re-evaluation in 1 week(s) here in the clinic. If anything worsens or changes patient will contact our office for additional recommendations. Electronic Signature(s) Signed: 03/28/2018 11:18:14 PM By: Worthy Keeler PA-C Entered By: Worthy Keeler on 03/28/2018 23:11:37 [name], [name] ([id number]) -------------------------------------------------------------------------------- ROS/PFSH Details Patient Name: [name] Date of Service: 03/26/2018 10:00 AM Medical Record  Number: [id number] Patient Account Number: [id number] Date of Birth/Sex: 16-Jan-1941 (77 y.o. F) Treating RN: Montey Hora Primary Care Provider: Salome Holmes Other Clinician: Referring Provider: Salome Holmes Treating Provider/Extender: Melburn Hake, HOYT Weeks in Treatment: 3 Information Obtained From Patient Wound History Do you currently have one or more open woundso Yes How many open wounds do you currently haveo 2 Approximately how long have you had your woundso 2 weeks How have you been treating your wound(s) until nowo gauze Has your wound(s) ever healed and then re-openedo No Have you had any lab work done in the past montho Yes Have you tested positive for an antibiotic resistant organism (MRSA, VRE)o No Have you tested positive for osteomyelitis (bone infection)o No Have you had any tests for circulation on your legso No Have you had other problems associated with your woundso Infection Constitutional Symptoms (General Health) Complaints and Symptoms: Negative for: Fever; Chills Cardiovascular Complaints and Symptoms: Positive for: LE edema Medical History: Positive for: Angina; Arrhythmia - murmur; Hypertension Negative for: Congestive Heart Failure; Coronary Artery  Disease; Deep Vein Thrombosis; Hypotension; Myocardial Infarction; Peripheral Arterial Disease; Peripheral Venous Disease; Phlebitis; Vasculitis Eyes Medical History: Negative for: Cataracts; Glaucoma; Optic Neuritis Ear/Nose/Mouth/Throat Medical History: Positive for: Chronic sinus problems/congestion - allergys seasonal Negative for: Middle ear problems Hematologic/Lymphatic Medical History: Positive for: Anemia; Lymphedema Negative for: Hemophilia; Human Immunodeficiency Virus; Sickle Cell Disease Respiratory Complaints and Symptoms: No Complaints or Symptoms Wilhelmi, Cletus M. ([id number]) Medical History: Negative for: Aspiration; Asthma; Chronic Obstructive Pulmonary Disease (COPD); Pneumothorax; Sleep Apnea; Tuberculosis Gastrointestinal Medical History: Negative for: Cirrhosis ; Colitis; Crohnos; Hepatitis A; Hepatitis B; Hepatitis C Endocrine Medical History: Negative for: Type I Diabetes; Type II Diabetes Genitourinary Medical History: Negative for: End Stage Renal Disease Immunological Medical History: Negative for: Lupus Erythematosus; Raynaudos; Scleroderma Integumentary (Skin) Medical History: Negative for: History of Burn; History of pressure wounds Musculoskeletal Medical History: Positive for: Gout; Rheumatoid Arthritis; Osteoarthritis Negative for: Osteomyelitis Neurologic Medical History: Negative for: Dementia; Neuropathy; Quadriplegia; Paraplegia; Seizure Disorder Oncologic Medical History: Negative for: Received Chemotherapy; Received Radiation Psychiatric Complaints and Symptoms: No Complaints or Symptoms Medical History: Negative for: Anorexia/bulimia; Confinement Anxiety HBO Extended History Items Ear/Nose/Mouth/Throat: Chronic sinus problems/congestion Immunizations Glandon, NIKEYA MAXIM. ([id number]) Pneumococcal Vaccine: Received Pneumococcal Vaccination: No Implantable Devices Family and Social History Cancer: Yes - Mother; Diabetes: Yes -  Siblings; Heart Disease: No; Hereditary Spherocytosis: No; Hypertension: Yes - Siblings; Kidney Disease: No; Lung Disease: Yes - Siblings; Seizures: No; Stroke: No; Thyroid Problems: No; Tuberculosis: No; Never smoker; Marital Status - Widowed; Alcohol Use: Never; Drug Use: No History; Caffeine Use: Never; Financial Concerns: No; Food, Clothing or Shelter Needs: No; Support System Lacking: No; Transportation Concerns: No; Advanced Directives: No; Patient does not want information on Advanced Directives; Do not resuscitate: No; Living Will: No; Medical Power of Attorney: No Physician Affirmation I have reviewed and agree with the above information. Electronic Signature(s) Signed: 03/28/2018 11:18:14 PM By: Worthy Keeler PA-C Signed: 03/29/2018 5:27:17 PM By: Montey Hora Entered By: Worthy Keeler on 03/28/2018 23:10:19 [place], [name] ([id number]) -------------------------------------------------------------------------------- SuperBill Details Patient Name: [name] Date of Service: 03/26/2018 Medical Record Number: [id number] Patient Account Number: [id number] Date of Birth/Sex: 20-Jan-1941 (77 y.o. 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, in remission Facility Procedures CPT4 Code: [id number] Description: 99214 - WOUND CARE VISIT-LEV 4 EST PT Modifier: Quantity: 1 Physician Procedures CPT4 Code Description: 8343735 78978 - WC PHYS LEVEL 3 - EST PT ICD-10 Diagnosis Description I89.0 Lymphedema, not elsewhere  classified L97.821 Non-pressure chronic ulcer of other part of left lower leg limi L97.811 Non-pressure chronic ulcer of other  part of right lower leg lim 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

## 2018-04-02 ENCOUNTER — Encounter: Payer: Medicare HMO | Admitting: Physician Assistant

## 2018-04-02 DIAGNOSIS — I890 Lymphedema, not elsewhere classified: Secondary | ICD-10-CM | POA: Diagnosis not present

## 2018-04-05 NOTE — Progress Notes (Signed)
[name] ([id number]) Visit Report for 04/02/2018 Chief Complaint Document Details Patient Name: [name], [name] Date of Service: 04/02/2018 1:15 PM Medical Record Number: [id number] Patient Account Number: [id number] Date of Birth/Sex: 1941-04-17 (77 y.o. F) Treating RN: Montey Hora Primary Care Provider: Salome Holmes Other Clinician: Referring Provider: Salome Holmes Treating Provider/Extender: Melburn Hake, Kesleigh Morson Weeks in Treatment: 4 Information Obtained from: Patient Chief Complaint Bilateral LE Blisters/Ulcers Electronic Signature(s) Signed: 04/05/2018 12:55:36 AM By: Worthy Keeler PA-C Entered By: Worthy Keeler on 04/02/2018 12:48:54 Basso, [name] ([id number]) -------------------------------------------------------------------------------- HPI Details Patient Name: [name] Date of Service: 04/02/2018 1:15 PM Medical Record Number: [id number] Patient Account Number: [id number] Date of Birth/Sex: 08/23/1941 (77 y.o. F) Treating RN: Montey Hora Primary Care Provider: Salome Holmes Other Clinician: Referring Provider: Salome Holmes Treating Provider/Extender: Melburn Hake, Jahmai Finelli Weeks in Treatment: 4 History of Present Illness HPI Description: 03/05/18 on evaluation today patient presents for initial evaluation and our clinic due to significantly large blisters of the bilateral lower extremities which began initially on 02/18/18. 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 Secondarily it does appear that they felt she likely had cellulitis. Subsequently the patient was seen for fault evaluation on 03/03/18 and that is really when the cellulitis was diagnosed. She was actually placed on doxycycline 100 mg by mouth twice a day for 10 days. With that being said she was prescribed prednisone although she did not tolerate this. Apparently she had some alteration in her  mental status with taking this. 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 She has never smoked and unfortunately does have pain at the site where these blisters are located of the bilateral lower extremities. She also does appear to have significant lymphedema which obviously has been more chronic. 03/12/18 on evaluation today patient actually appears to be doing much better in regard to her bilateral [employer] ulcers. Unfortunately a lot of the alginate dressing did stick to her legs due to the fact that everything dried up rather rapidly in the compression seems to have done very well in that regard. Unfortunately the home health nurse last time she came out to the patient's home was not able to fully remove the alginate from her leg so she just rewrapped it with what was there continuing to be in place. Obviously that does need to come off but overall I'm extremely pleased with the progress it's been made over the past week which is the Kerlex and Coban wraps. 03/18/18-She is here in follow-up evaluation for bilateral lower extremity lymphedema with blister/ulceration. She is essentially healed to the right lower extremity with one residual pinpoint opening, the left lower extremity has multiple pinpoint openings with minimal amount of serous drainage. We will continue with compression over this next week. We have requested that home help initiate the process of ordering compression wraps for long-term management. She will follow-up next week, I anticipate she will be healed at that appointment 03/26/18 on evaluation today patient appears to be doing extremely well in regard to her bilateral lower extremity ulcers. This has completely resolved for the most part on the left lower extremity and has completely resolved in regard to the right lower extremity. She's been tolerating the dressing changes without complication including the  wraps. Overall her swelling is down significantly and I think this accounts for  why she is doing so much better at this time. Overall she is very pleased and having very little discomfort compared to previous. 04/02/18 on evaluation today patient actually appears to be completely healed which is excellent news. She has been tolerating the wraps without complication obviously this has done very well for her. In general she shows no signs of infection or otherwise complicating factors. She's having no pain. Electronic Signature(s) Signed: 04/05/2018 12:55:36 AM By: Worthy Keeler PA-C Entered By: Worthy Keeler on 04/02/2018 14:22:47 Delancy, [name] ([id number]) -------------------------------------------------------------------------------- Physical Exam Details Patient Name: [name] Date of Service: 04/02/2018 1:15 PM Medical Record Number: [id number] Patient Account Number: [id number] Date of Birth/Sex: 1941-08-14 (77 y.o. F) Treating RN: Montey Hora Primary Care Provider: Salome Holmes Other Clinician: Referring Provider: Salome Holmes Treating Provider/Extender: STONE III, Jairo Bellew Weeks in Treatment: 4 Constitutional Well-nourished and well-hydrated in no acute distress. Respiratory normal breathing without difficulty. Psychiatric this patient is able to make decisions and demonstrates good insight into disease process. Alert and Oriented x 3. pleasant and cooperative. Notes Patient has no open wounds at this point which is excellent news she has otherwise no concerns or complaints at this point she did receive the Juxta-Lite compression from home health although she has not used them as of yet obviously she did not bring them with her today. Electronic Signature(s) Signed: 04/05/2018 12:55:36 AM By: Worthy Keeler PA-C Entered By: Worthy Keeler on 04/02/2018 14:23:15 Shuck, [name]  ([id number]) -------------------------------------------------------------------------------- Physician Orders Details Patient Name: [name] Date of Service: 04/02/2018 1:15 PM Medical Record Number: [id number] Patient Account Number: [id number] Date of Birth/Sex: [birth year] (77 y.o. 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 SHE DID NOT BRING THEM TO THE CLINIC TODAY AND NEEDS TO BE TAUGHT HOW TO WEAR THEM. THANK YOU! Electronic Signature(s) Signed: 04/02/2018 2:11:58 PM By: Montey Hora Signed: 04/05/2018 12:55:36 AM By: Worthy Keeler PA-C Previous Signature: 04/02/2018 2:10:16 PM Version By: Montey Hora Entered By: Montey Hora on 04/02/2018 14:11:57 Acord, [name] ([id number]) -------------------------------------------------------------------------------- Problem List Details Patient Name: [name] Date of Service: 04/02/2018 1:15 PM Medical Record Number: [id number] Patient Account Number: [id number] Date of Birth/Sex: [birth year] (77 y.o. 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 F) Treating RN: Montey Hora Primary Care Provider: Salome Holmes Other Clinician: Referring Provider: Salome Holmes Treating Provider/Extender: Melburn Hake, Kourtnei Rauber Weeks in Treatment: 4 Subjective Chief Complaint Information obtained from Patient Bilateral LE Blisters/Ulcers History of Present Illness (HPI) 03/05/18 on evaluation today patient presents for initial evaluation and our clinic due to significantly large blisters of the bilateral lower extremities which began initially on 02/18/18. 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 Secondarily it does appear that they felt she likely had cellulitis. Subsequently the patient was seen for fault evaluation on 03/03/18 and that is really when the cellulitis was diagnosed. She was actually placed on doxycycline 100 mg by mouth twice a day for 10 days. With that being said she was prescribed prednisone although  she did not tolerate this. Apparently she had some alteration in her mental status with taking this. 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 She has never smoked and unfortunately does have pain at the site where these blisters are located of the bilateral lower extremities. She also does appear to have significant lymphedema which obviously has been more chronic. 03/12/18 on evaluation today patient actually appears to be doing much better in regard to her bilateral [employer] ulcers. Unfortunately a lot of the alginate dressing did stick to her legs due to the fact that everything dried up rather rapidly in the compression seems to have done very well in that regard. Unfortunately the home health nurse last time she came out to the patient's home was not able to fully remove the alginate from her leg so she just rewrapped it with what was there continuing to be in place. Obviously that does need to come off but overall I'm extremely pleased with the progress it's been made over the past week which is the Kerlex and Coban wraps. 03/18/18-She is here in follow-up evaluation for bilateral lower extremity lymphedema with blister/ulceration. She is essentially healed to the right lower extremity with one residual pinpoint opening, the left lower extremity has multiple pinpoint openings with minimal amount of serous drainage. We will continue with compression over this next week. We have requested that home help initiate the process of ordering compression wraps for long-term management. She will follow-up next week, I anticipate she will be healed at that appointment 03/26/18 on evaluation today patient appears to be doing extremely well in regard to her bilateral lower extremity ulcers. This has completely resolved for the most part on the left lower extremity and has completely resolved in regard to the right lower extremity. She's been  tolerating the dressing changes without complication including the wraps. Overall her swelling is down significantly and I think this accounts for why she is doing so much better at this time. Overall she is very pleased and having very little discomfort compared to previous. 04/02/18 on evaluation today patient actually appears to be completely healed which is excellent news. She has been tolerating the wraps without complication obviously this has done very well for her. In general she shows no signs of infection or otherwise complicating factors. She's having no  pain. Patient History Information obtained from Patient. Family History Cancer - Mother, Diabetes - Siblings, Hypertension - Siblings, Lung Disease - Siblings, No family history of Heart Disease, Hereditary Spherocytosis, Kidney Disease, Seizures, Stroke, Thyroid Problems, Tuberculosis. MILLIANNA, SZYMBORSKI ([id number]) Social History Never smoker, Marital Status - Widowed, Alcohol Use - Never, Drug Use - No History, Caffeine Use - Never. Review of Systems (ROS) Constitutional Symptoms (General Health) Denies complaints or symptoms of Fever, Chills. Psychiatric The patient has no complaints or symptoms. Objective Constitutional Well-nourished and well-hydrated in no acute distress. Vitals Time Taken: 1:12 PM, Height: 65 in, Weight: 143.8 lbs, BMI: 23.9, Temperature: 98.2 F, Pulse: 69 bpm, Respiratory Rate: 18 breaths/min, Blood Pressure: 164/80 mmHg. Respiratory normal breathing without difficulty. Psychiatric this patient is able to make decisions and demonstrates good insight into disease process. Alert and Oriented x 3. pleasant and cooperative. General Notes: Patient has no open wounds at this point which is excellent news she has otherwise no concerns or complaints at this point she did receive the Juxta-Lite compression from home health although she has not used them as of yet obviously she did not bring them with her  today. Integumentary (Hair, Skin) Wound #2 status is Healed - Epithelialized. Original cause of wound was Blister. The wound is located on the Left,Lateral Lower Leg. The wound measures 0cm length x 0cm width x 0cm depth; 0cm^2 area and 0cm^3 volume. 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 ([id number]) Plan Edema Control: Other: - [place] PATIENT DID NOT HAVE HER COMPRESSION WRAPS Discharge From [facility] Services: Discharge from [place] WRAPS. SHE DID NOT BRING THEM TO THE CLINIC TODAY AND NEEDS TO BE TAUGHT HOW TO WEAR THEM. THANK YOU! At this time my suggestion for her is going to be to go ahead and initiate treatment with the Juxta-Lite compression. With that being said I did go ahead and recommend that we wrap her with Kerlex and Coban for today until home health comes out to discharge her at which point they can teach her how to use the order compression wraps. This may be Juxta-Lite or another brand I cannot remember. Nonetheless either way I think that is an appropriate way to go at this point. Patient is in agreement with this plan. I explained she needs to wear these from the time she gets up in the morning until she goes to bed at night she agrees. Electronic Signature(s) Signed: 04/05/2018 12:55:36 AM By: Worthy Keeler PA-C Entered By: Worthy Keeler on 04/02/2018 14:24:07 Nussbaumer, [name] ([id number]) -------------------------------------------------------------------------------- ROS/PFSH Details Patient Name: [name] Date of Service: 04/02/2018 1:15 PM Medical Record Number:  [id number] Patient Account Number: [id number] Date of Birth/Sex: [birth year] (77 y.o. F) Treating RN: Montey Hora Primary Care Provider: Salome Holmes Other Clinician: Referring Provider: Salome Holmes Treating Provider/Extender: Melburn Hake, Preethi Scantlebury Weeks in Treatment: 4 Information Obtained From Patient Wound History Do you currently have one or more open woundso Yes How many open wounds do you currently haveo 2 Approximately how long have you had your woundso 2 weeks How have you been treating your wound(s) until nowo gauze Has your wound(s) ever healed and  then re-openedo No Have you had any lab work done in the past montho Yes Have you tested positive for an antibiotic resistant organism (MRSA, VRE)o No Have you tested positive for osteomyelitis (bone infection)o No Have you had any tests for circulation on your legso No Have you had other problems associated with your woundso Infection Constitutional Symptoms (General Health) Complaints and Symptoms: Negative for: Fever; Chills Eyes Medical History: Negative for: Cataracts; Glaucoma; Optic Neuritis Ear/Nose/Mouth/Throat Medical History: Positive for: Chronic sinus problems/congestion - allergys seasonal Negative for: Middle ear problems Hematologic/Lymphatic Medical History: Positive for: Anemia; Lymphedema Negative for: Hemophilia; Human Immunodeficiency Virus; Sickle Cell Disease Respiratory Medical History: Negative for: Aspiration; Asthma; Chronic Obstructive Pulmonary Disease (COPD); Pneumothorax; Sleep Apnea; Tuberculosis Cardiovascular Medical History: Positive for: Angina; Arrhythmia - murmur; Hypertension Negative for: Congestive Heart Failure; Coronary Artery Disease; Deep Vein Thrombosis; Hypotension; Myocardial Infarction; Peripheral Arterial Disease; Peripheral Venous Disease; Phlebitis; Vasculitis Manard, Karalee M. ([id number]) Gastrointestinal Medical History: Negative for: Cirrhosis ; Colitis; Crohnos;  Hepatitis A; Hepatitis B; Hepatitis C Endocrine Medical History: Negative for: Type I Diabetes; Type II Diabetes Genitourinary Medical History: Negative for: End Stage Renal Disease Immunological Medical History: Negative for: Lupus Erythematosus; Raynaudos; Scleroderma Integumentary (Skin) Medical History: Negative for: History of Burn; History of pressure wounds Musculoskeletal Medical History: Positive for: Gout; Rheumatoid Arthritis; Osteoarthritis Negative for: Osteomyelitis Neurologic Medical History: Negative for: Dementia; Neuropathy; Quadriplegia; Paraplegia; Seizure Disorder Oncologic Medical History: Negative for: Received Chemotherapy; Received Radiation Psychiatric Complaints and Symptoms: No Complaints or Symptoms Medical History: Negative for: Anorexia/bulimia; Confinement Anxiety HBO Extended History Items Ear/Nose/Mouth/Throat: Chronic sinus problems/congestion Immunizations Pneumococcal Vaccine: Received Pneumococcal Vaccination: No Implantable Devices Family and Social History PATRICIANN, BECHT. ([id number]) Cancer: Yes - Mother; Diabetes: Yes - Siblings; Heart Disease: No; Hereditary Spherocytosis: No; Hypertension: Yes - Siblings; Kidney Disease: No; Lung Disease: Yes - Siblings; Seizures: No; Stroke: No; Thyroid Problems: No; Tuberculosis: No; Never smoker; Marital Status - Widowed; Alcohol Use: Never; Drug Use: No History; Caffeine Use: Never; Financial Concerns: No; Food, Clothing or Shelter Needs: No; Support System Lacking: No; Transportation Concerns: No; Advanced Directives: No; Patient does not want information on Advanced Directives; Do not resuscitate: No; Living Will: No; Medical Power of Attorney: No Electronic Signature(s) Signed: 04/02/2018 4:49:47 PM By: Montey Hora Signed: 04/05/2018 12:55:36 AM By: Worthy Keeler PA-C Entered By: Worthy Keeler on 04/02/2018 14:23:01 Pamer, [name]  ([id number]) -------------------------------------------------------------------------------- SuperBill Details Patient Name: [name] Date of Service: 04/02/2018 Medical Record Number: [id number] Patient Account Number: [id number] Date of Birth/Sex: 1941-08-27 (77 y.o. 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, in remission Facility Procedures CPT4 Code: [id number] Description: 99213 - WOUND CARE VISIT-LEV 3 EST PT Modifier: Quantity: 1 Physician Procedures CPT4 Code Description: [id number] 19379 - WC PHYS LEVEL 2 - EST PT ICD-10 Diagnosis Description I89.0 Lymphedema, not elsewhere classified L97.821 Non-pressure chronic ulcer of other part of left lower leg limi L97.811 Non-pressure chronic ulcer of other  part of right lower leg lim 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

## 2018-04-08 NOTE — Progress Notes (Signed)
[name], [name] ([id number]) Visit Report for 04/02/2018 Arrival Information Details Patient Name: [name], [name] Date of Service: 04/02/2018 1:15 PM Medical Record Number: [id number] Patient Account Number: [id number] Date of Birth/Sex: 1941/01/30 (77 y.o. F) Treating RN: Roger Shelter Primary Care Jayma Volpi: Salome Holmes Other Clinician: Referring Caige Almeda: Salome Holmes Treating Lorina Duffner/Extender: Melburn Hake, HOYT Weeks in Treatment: 4 Visit Information History Since Last Visit All ordered tests and consults were completed: No Patient Arrived: [name] Added or deleted any medications: No Arrival Time: 13:09 Any new allergies or adverse reactions: No Accompanied By: self Had a fall or experienced change in No Transfer Assistance: None activities of daily living that may affect Patient Identification Verified: Yes risk of falls: Secondary Verification Process Completed: Yes Signs or symptoms of abuse/neglect since last visito No Patient Requires Transmission-Based Precautions: No Hospitalized since last visit: No Patient Has Alerts: No Implantable device outside of the clinic excluding No cellular tissue based products placed in the center since last visit: Pain Present Now: No Electronic Signature(s) Signed: 04/02/2018 4:42:38 PM By: Roger Shelter Entered By: Roger Shelter on 04/02/2018 13:11:55 Dowers, [name] ([id number]) -------------------------------------------------------------------------------- Clinic Level of Care Assessment Details Patient Name: [name] Date of Service: 04/02/2018 1:15 PM Medical Record Number: [id number] Patient Account Number: [id number] Date of Birth/Sex: 23-Feb-1941 (77 y.o. F) Treating RN: Montey Hora Primary Care Dartanian Knaggs: Salome Holmes Other Clinician: Referring Dare Spillman: Salome Holmes Treating Henok Heacock/Extender: Melburn Hake, HOYT Weeks in Treatment: 4 Clinic Level of Care Assessment Items TOOL 4 Quantity Score []  - Use  when only an EandM is performed on FOLLOW-UP visit 0 ASSESSMENTS - Nursing Assessment / Reassessment X - Reassessment of Co-morbidities (includes updates in patient status) 1 10 X- 1 5 Reassessment of Adherence to Treatment Plan ASSESSMENTS - Wound and Skin Assessment / Reassessment X - Simple Wound Assessment / Reassessment - one wound 1 5 []  - 0 Complex Wound Assessment / Reassessment - multiple wounds []  - 0 Dermatologic / Skin Assessment (not related to wound area) ASSESSMENTS - Focused Assessment X - Circumferential Edema Measurements - multi extremities 1 5 []  - 0 Nutritional Assessment / Counseling / Intervention X- 1 5 Lower Extremity Assessment (monofilament, tuning fork, pulses) []  - 0 Peripheral Arterial Disease Assessment (using hand held doppler) ASSESSMENTS - Ostomy and/or Continence Assessment and Care []  - Incontinence Assessment and Management 0 []  - 0 Ostomy Care Assessment and Management (repouching, etc.) PROCESS - Coordination of Care X - Simple Patient / Family Education for ongoing care 1 15 []  - 0 Complex (extensive) Patient / Family Education for ongoing care []  - 0 Staff obtains Programmer, systems, Records, Test Results / Process Orders []  - 0 Staff telephones HHA, Nursing Homes / Clarify orders / etc []  - 0 Routine Transfer to another Facility (non-emergent condition) []  - 0 Routine Hospital Admission (non-emergent condition) []  - 0 New Admissions / Biomedical engineer / Ordering NPWT, Apligraf, etc. []  - 0 Emergency Hospital Admission (emergent condition) X- 1 10 Simple Discharge Coordination Brashier, Marija M. ([id number]) []  - 0 Complex (extensive) Discharge Coordination PROCESS - Special Needs []  - Pediatric / Minor Patient Management 0 []  - 0 Isolation Patient Management []  - 0 Hearing / Language / Visual special needs []  - 0 Assessment of Community assistance (transportation, D/C planning, etc.) []  - 0 Additional assistance / Altered  mentation []  - 0 Support Surface(s) Assessment (bed, cushion, seat, etc.) INTERVENTIONS - Wound Cleansing / Measurement X - Simple Wound Cleansing - one wound 1 5 []  - 0  Complex Wound Cleansing - multiple wounds X- 1 5 Wound Imaging (photographs - any number of wounds) []  - 0 Wound Tracing (instead of photographs) X- 1 5 Simple Wound Measurement - one wound []  - 0 Complex Wound Measurement - multiple wounds INTERVENTIONS - Wound Dressings []  - Small Wound Dressing one or multiple wounds 0 []  - 0 Medium Wound Dressing one or multiple wounds X- 1 20 Large Wound Dressing one or multiple wounds []  - 0 Application of Medications - topical []  - 0 Application of Medications - injection INTERVENTIONS - Miscellaneous []  - External ear exam 0 []  - 0 Specimen Collection (cultures, biopsies, blood, body fluids, etc.) []  - 0 Specimen(s) / Culture(s) sent or taken to Lab for analysis []  - 0 Patient Transfer (multiple staff / Civil Service fast streamer / Similar devices) []  - 0 Simple Staple / Suture removal (25 or less) []  - 0 Complex Staple / Suture removal (26 or more) []  - 0 Hypo / Hyperglycemic Management (close monitor of Blood Glucose) []  - 0 Ankle / Brachial Index (ABI) - do not check if billed separately X- 1 5 Vital Signs Redmond, Teneil M. ([id number]) Has the patient been seen at the hospital within the last three years: Yes Total Score: 95 Level Of Care: New/Established - Level 3 Electronic Signature(s) Signed: 04/02/2018 4:49:47 PM By: Montey Hora Entered By: Montey Hora on 04/02/2018 14:11:06 Packett, [name] ([id number]) -------------------------------------------------------------------------------- Encounter Discharge Information Details Patient Name: [name] Date of Service: 04/02/2018 1:15 PM Medical Record Number: [id number] Patient Account Number: [id number] Date of Birth/Sex: 19-Apr-1941 (77 y.o. F) Treating RN: Ahmed Prima Primary Care Mohmmad Saleeby: Salome Holmes Other Clinician: Referring Madalee Altmann: Salome Holmes Treating Anatasia Tino/Extender: Melburn Hake, HOYT Weeks in Treatment: 4 Encounter Discharge Information Items Discharge Condition: Stable Ambulatory Status: Cane Discharge Destination: Home Transportation: Private Auto Accompanied By: son Schedule Follow-up Appointment: No Clinical Summary of Care: Electronic Signature(s) Signed: 04/02/2018 2:18:02 PM By: Alric Quan Entered By: Alric Quan on 04/02/2018 14:18:02 Salamone, [name] ([id number]) -------------------------------------------------------------------------------- Lower Extremity Assessment Details Patient Name: [name] Date of Service: 04/02/2018 1:15 PM Medical Record Number: [id number] Patient Account Number: [id number] Date of Birth/Sex: 16-May-1941 (77 y.o. F) Treating RN: Roger Shelter Primary Care Easter Schinke: Salome Holmes Other Clinician: Referring Antavius Sperbeck: Salome Holmes Treating Alisea Matte/Extender: Melburn Hake, HOYT Weeks in Treatment: 4 Edema Assessment Assessed: [Left: No] [Right: No] [Left: Edema] [Right: :] Calf Left: Right: Point of Measurement: 35 cm From Medial Instep 34.6 cm 32 cm Ankle Left: Right: Point of Measurement: 12 cm From Medial Instep 28.3 cm 25.5 cm Vascular Assessment Pulses: Dorsalis Pedis Palpable: [Left:Yes] [Right:Yes] Posterior Tibial Extremity colors, hair growth, and conditions: Extremity Color: [Left:Hyperpigmented] [Right:Hyperpigmented] Hair Growth on Extremity: [Left:No] Temperature of Extremity: [Left:Warm] [Right:Warm] Capillary Refill: [Left:< 3 seconds] [Right:< 3 seconds] Toe Nail Assessment Left: Right: Thick: Yes Yes Discolored: Yes Yes Deformed: Yes Yes Improper Length and Hygiene: Yes Yes Electronic Signature(s) Signed: 04/02/2018 4:42:38 PM By: Roger Shelter Entered By: Roger Shelter on 04/02/2018 13:19:32 Kleeman, [name]  ([id number]) -------------------------------------------------------------------------------- [place] Details Patient Name: [name] Date of Service: 04/02/2018 1:15 PM Medical Record Number: [id number] Patient Account Number: [id number] Date of Birth/Sex: 1941-07-15 (77 y.o. F) Treating RN: Montey Hora Primary Care Ezra Marquess: Salome Holmes Other Clinician: Referring Jamerica Snavely: Salome Holmes Treating Absalom Aro/Extender: Melburn Hake, HOYT Weeks in Treatment: 4 Active Inactive Electronic Signature(s) Signed: 04/02/2018 4:49:47 PM By: Montey Hora Entered By: Montey Hora on 04/02/2018 15:53:57 Wendorff, Raigen M. ([id number]) -------------------------------------------------------------------------------- Pain Assessment Details  Patient Name: JAIDEE, STIPE Date of Service: 04/02/2018 1:15 PM Medical Record Number: [id number] Patient Account Number: [id number] Date of Birth/Sex: [birth year] (77 y.o. F) Treating RN: Roger Shelter Primary Care Nieko Clarin: Salome Holmes Other Clinician: Referring Zoejane Gaulin: Salome Holmes Treating Alfretta Pinch/Extender: Melburn Hake, HOYT Weeks in Treatment: 4 Active Problems Location of Pain Severity and Description of Pain Patient Has Paino No Site Locations Pain Management and Medication Current Pain Management: Electronic Signature(s) Signed: 04/02/2018 4:42:38 PM By: Roger Shelter Entered By: Roger Shelter on 04/02/2018 13:12:31 Strupp, [name] ([id number]) -------------------------------------------------------------------------------- Patient/Caregiver Education Details Patient Name: [name] Date of Service: 04/02/2018 1:15 PM Medical Record Number: [id number] Patient Account Number: [id number] Date of Birth/Gender: 02/22/1941 (77 y.o. F) Treating RN: Ahmed Prima Primary Care Physician: Salome Holmes Other Clinician: Referring Physician: Salome Holmes Treating Physician/Extender: Sharalyn Ink in  Treatment: 4 Education Assessment Education Provided To: Patient Education Topics Provided Wound/Skin Impairment: Handouts: Other: Please call our office if you have any questions or concerns. Methods: Explain/Verbal Responses: State content correctly Electronic Signature(s) Signed: 04/07/2018 4:02:05 PM By: Alric Quan Entered By: Alric Quan on 04/02/2018 14:18:24 Kortz, [name] ([id number]) -------------------------------------------------------------------------------- Wound Assessment Details Patient Name: [name] Date of Service: 04/02/2018 1:15 PM Medical Record Number: [id number] Patient Account Number: [id number] Date of Birth/Sex: 15-Jan-1941 (77 y.o. F) Treating RN: Roger Shelter Primary Care Lorri Fukuhara: Salome Holmes Other Clinician: Referring Tanya Crothers: Salome Holmes Treating Theodis Kinsel/Extender: Melburn Hake, HOYT Weeks in Treatment: 4 Wound Status Wound Number: 2 Primary Etiology: Lymphedema Wound Location: Left, Lateral Lower Leg Wound Status: Healed - Epithelialized Wounding Event: Blister Date Acquired: 02/19/2018 Weeks Of Treatment: 4 Clustered Wound: No Wound Measurements Length: (cm) 0 Width: (cm) 0 Depth: (cm) 0 Area: (cm) 0 Volume: (cm) 0 % Reduction in Area: 100% % Reduction in Volume: 100% Wound Description Full Thickness Without Exposed Support Classification: Structures Periwound Skin Texture Texture Color No Abnormalities Noted: No No Abnormalities Noted: No Moisture No Abnormalities Noted: No Electronic Signature(s) Signed: 04/02/2018 4:42:38 PM By: Roger Shelter Entered By: Roger Shelter on 04/02/2018 13:17:20 Garfinkel, [name] ([id number]) -------------------------------------------------------------------------------- Vitals Details Patient Name: [name] Date of Service: 04/02/2018 1:15 PM Medical Record Number: [id number] Patient Account Number: [id number] Date of Birth/Sex: [birth year] (77 y.o.  F) Treating RN: Roger Shelter Primary Care Ceriah Kohler: Salome Holmes Other Clinician: Referring Shelitha Magley: Salome Holmes Treating Enisa Runyan/Extender: Melburn Hake, HOYT Weeks in Treatment: 4 Vital Signs Time Taken: 13:12 Temperature (F): 98.2 Height (in): 65 Pulse (bpm): 69 Weight (lbs): 143.8 Respiratory Rate (breaths/min): 18 Body Mass Index (BMI): 23.9 Blood Pressure (mmHg): 164/80 Reference Range: 80 - 120 mg / dl Electronic Signature(s) Signed: 04/02/2018 4:42:38 PM By: Roger Shelter Entered By: Roger Shelter on 04/02/2018 13:12:51

## 2018-05-04 ENCOUNTER — Inpatient Hospital Stay: Payer: Medicare HMO | Attending: Internal Medicine

## 2018-05-04 ENCOUNTER — Inpatient Hospital Stay: Payer: Medicare HMO | Admitting: Internal Medicine

## 2018-05-04 NOTE — Assessment & Plan Note (Deleted)
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 Today hemoglobin is 11.2; white count 9/mild monocytosis/ platelets 99 [stable. On surveillance.  No concern for any progression of disease at this time.  # CKD- stage III- stable.creat 1.3 today.   # Elevated Blood pressure- again reminded compliance with medications/diet.   #  Patient will follow-up with me in 6 months/labs; # She was asked to call us sooner if noted any abnormal symptoms of bleeding. She agrees.  Cc; Dr.George.

## 2018-05-04 NOTE — Progress Notes (Deleted)
[facility] OFFICE PROGRESS NOTE  Patient Care Team: [name], MD as PCP - General (Family Medicine)  Cancer Staging No matching staging information was found for the patient.   Oncology History   #2013-? CHRONIC MYELOMONOCYTIC LEUKEMIA-1 [NOV 2016- [facility]MBx; [name]; atypical monocytes with multilinege dysplasia; <5% blasts; [name];  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  female pleasant patient above history of  Review of Systems  Constitutional: Negative for chills, diaphoresis, fever, malaise/fatigue and weight loss.  HENT: Negative for nosebleeds and sore throat.   Eyes: Negative for double vision.  Respiratory: Negative for cough, hemoptysis, sputum production, shortness of breath and wheezing.   Cardiovascular: Negative for chest pain, palpitations, orthopnea and leg swelling.  Gastrointestinal: Negative for abdominal pain, blood in stool, constipation, diarrhea, heartburn, melena, nausea and vomiting.  Genitourinary: Negative for dysuria, frequency and urgency.  Musculoskeletal: Negative for back pain and joint pain.  Skin: Negative.  Negative for itching and rash.  Neurological: Negative for dizziness, tingling, focal weakness, weakness and headaches.  Endo/Heme/Allergies: Does not bruise/bleed easily.  Psychiatric/Behavioral: Negative for depression. The patient is not nervous/anxious and does not have insomnia.       PAST MEDICAL HISTORY :  Past Medical History:  Diagnosis Date  . Allergic rhinitis   . Arthritis   . CML (chronic myelocytic leukemia) ([place])   . GERD (gastroesophageal reflux disease)   . Gout   . History of goiter   . Hyperlipidemia   .  Hypertension   . Hypothyroidism   . IDA (iron deficiency anemia)   . ITP (idiopathic thrombocytopenic purpura)   . Urinary incontinence     PAST SURGICAL HISTORY :   Past Surgical History:  Procedure Laterality Date  . INCISION AND DRAINAGE Right 06/05/2015   Procedure: INCISION AND DRAINAGE, RIGHT WRIST;  Surgeon: Milly Jakob, MD;  Location: [facility];  Service: Orthopedics;  Laterality: Right;    FAMILY HISTORY :   Family History  Problem Relation Age of Onset  . Heart attack Mother   . Hypertension Mother   . Cancer Father   . Hypertension Father   . Breast cancer Paternal Grandmother   . Diabetes Mellitus II Sister     SOCIAL HISTORY:   Social History   Tobacco Use  . Smoking status: Never Smoker  . Smokeless tobacco: Never Used  Substance Use Topics  . Alcohol use: No    Alcohol/week: 0.0 oz  . Drug use: No    ALLERGIES:  is allergic to sulfa antibiotics; sulfamethoxazole-trimethoprim; and aspirin.  MEDICATIONS:  Current Outpatient Medications  Medication Sig Dispense Refill  . acetaminophen (TYLENOL) 650 MG CR tablet Take 1 tablet (650 mg total) by mouth every 6 (six) hours. 30 tablet 0  . amLODipine (NORVASC) 10 MG tablet Take 10 mg by mouth daily.     . Calcium Carbonate-Vitamin D (CALCIUM-VITAMIN D) 500-200 MG-UNIT tablet Take 1 tablet by mouth daily.    . Cholecalciferol (VITAMIN D3) 1000 units CAPS Take 1 capsule by mouth 1 day or 1 dose.    . docusate sodium (COLACE) 100 MG capsule Take 100 mg by mouth 2 (two) times daily.    Marland Kitchen  febuxostat (ULORIC) 40 MG tablet Take 40 mg by mouth daily.     . Fish Oil-Cholecalciferol (FISH OIL + D3 PO) Take by mouth daily.    . fluticasone (FLONASE) 50 MCG/ACT nasal spray Place 1 spray into both nostrils daily. Reported on 04/20/2955  11  . folic acid (FOLVITE) 1 MG tablet Take 1 tablet (1 mg total) by mouth daily. 30 tablet 0  . furosemide (LASIX) 20 MG tablet Take 1 tablet by mouth daily.  11  . hydroxychloroquine  (PLAQUENIL) 200 MG tablet Take 200 mg by mouth daily.    Marland Kitchen losartan (COZAAR) 25 MG tablet Take by mouth.    . metoprolol tartrate (LOPRESSOR) 25 MG tablet Take 25 mg by mouth 2 (two) times daily. Reported on 12/18/2015    . Multiple Vitamins-Minerals (ICAPS) CAPS Take 1 capsule by mouth 1 day or 1 dose.    . oxybutynin (DITROPAN) 5 MG tablet Take 5 mg by mouth daily.  5  . pantoprazole (PROTONIX) 40 MG tablet Take 40 mg by mouth daily before breakfast.    . Polyethyl Glycol-Propyl Glycol (SYSTANE) 0.4-0.3 % SOLN Apply to eye.    . vitamin B-12 (CYANOCOBALAMIN) 1000 MCG tablet Take 1 tablet by mouth daily.     No current facility-administered medications for this visit.     PHYSICAL EXAMINATION: ECOG PERFORMANCE STATUS: {CHL ONC ECOG PS:[id number]}  There were no vitals taken for this visit.  There were no vitals filed for this visit.  GENERAL: Well-nourished well-developed; Alert, no distress and comfortable.  *** Alone/Accompanied by family.  EYES: no pallor or icterus OROPHARYNX: no thrush or ulceration; NECK: supple; no lymph nodes felt. LYMPH:  no palpable lymphadenopathy in the axillary or inguinal regions LUNGS: Decreased breath sounds auscultation bilaterally. No wheeze or crackles HEART/CVS: regular rate & rhythm and no murmurs; No lower extremity edema ABDOMEN:abdomen soft, non-tender and normal bowel sounds. No hepatomegaly or splenomegaly.  Musculoskeletal:no cyanosis of digits and no clubbing  PSYCH: alert & oriented x 3 with fluent speech NEURO: no focal motor/sensory deficits SKIN:  no rashes or significant lesions    LABORATORY DATA:  I have reviewed the data as listed    Component Value Date/Time   NA 138 11/03/2017 0956   NA 140 03/14/2014 1150   K 3.8 11/03/2017 0956   K 4.1 04/11/2014 1037   CL 102 11/03/2017 0956   CL 104 03/14/2014 1150   CO2 30 11/03/2017 0956   CO2 28 03/14/2014 1150   GLUCOSE 93 11/03/2017 0956   GLUCOSE 86 03/14/2014 1150   BUN  28 (H) 11/03/2017 0956   BUN 35 (H) 03/14/2014 1150   CREATININE 1.32 (H) 11/03/2017 0956   CREATININE 1.63 (H) 10/17/2014 1056   CALCIUM 8.8 (L) 11/03/2017 0956   CALCIUM 8.8 03/14/2014 1150   PROT 8.0 11/03/2017 0956   PROT 9.1 (H) 06/28/2013 1628   ALBUMIN 3.6 11/03/2017 0956   ALBUMIN 2.6 (L) 06/28/2013 1628   AST 16 11/03/2017 0956   AST 16 06/28/2013 1628   ALT 13 (L) 11/03/2017 0956   ALT 12 06/28/2013 1628   ALKPHOS 56 11/03/2017 0956   ALKPHOS 46 09/27/2013 0929   BILITOT 0.9 11/03/2017 0956   BILITOT 0.3 06/16/2014 1220   GFRNONAA 39 (L) 11/03/2017 0956   GFRNONAA 33 (L) 10/17/2014 1056   GFRNONAA 31 (L) 06/20/2014 1356   GFRAA 45 (L) 11/03/2017 0956   GFRAA 40 (L) 10/17/2014 1056   GFRAA 36 (L) 06/20/2014 1356  No results found for: SPEP, UPEP  Lab Results  Component Value Date   WBC 9.9 11/03/2017   NEUTROABS 5.2 11/03/2017   HGB 11.6 (L) 11/03/2017   HCT 35.3 11/03/2017   MCV 88.6 11/03/2017   PLT 98 (L) 11/03/2017      Chemistry      Component Value Date/Time   NA 138 11/03/2017 0956   NA 140 03/14/2014 1150   K 3.8 11/03/2017 0956   K 4.1 04/11/2014 1037   CL 102 11/03/2017 0956   CL 104 03/14/2014 1150   CO2 30 11/03/2017 0956   CO2 28 03/14/2014 1150   BUN 28 (H) 11/03/2017 0956   BUN 35 (H) 03/14/2014 1150   CREATININE 1.32 (H) 11/03/2017 0956   CREATININE 1.63 (H) 10/17/2014 1056      Component Value Date/Time   CALCIUM 8.8 (L) 11/03/2017 0956   CALCIUM 8.8 03/14/2014 1150   ALKPHOS 56 11/03/2017 0956   ALKPHOS 46 09/27/2013 0929   AST 16 11/03/2017 0956   AST 16 06/28/2013 1628   ALT 13 (L) 11/03/2017 0956   ALT 12 06/28/2013 1628   BILITOT 0.9 11/03/2017 0956   BILITOT 0.3 06/16/2014 1220       RADIOGRAPHIC STUDIES: I have personally reviewed the radiological images as listed and agreed with the findings in the report. No results found.   ASSESSMENT & PLAN:  No problem-specific Assessment & Plan notes found for this  encounter.   No orders of the defined types were placed in this encounter.  All questions were answered. The patient knows to call the clinic with any problems, questions or concerns.      Cammie Sickle, MD 05/04/2018 8:45 AM

## 2018-05-11 ENCOUNTER — Inpatient Hospital Stay: Payer: Medicare HMO | Attending: Internal Medicine | Admitting: Internal Medicine

## 2018-05-11 ENCOUNTER — Other Ambulatory Visit: Payer: Self-pay

## 2018-05-11 ENCOUNTER — Inpatient Hospital Stay: Payer: Medicare HMO | Attending: Internal Medicine

## 2018-05-11 VITALS — BP 183/92 | HR 56 | Temp 98.0°F | Resp 18 | Ht 65.0 in | Wt 148.9 lb

## 2018-05-11 DIAGNOSIS — I129 Hypertensive chronic kidney disease with stage 1 through stage 4 chronic kidney disease, or unspecified chronic kidney disease: Secondary | ICD-10-CM | POA: Diagnosis not present

## 2018-05-11 DIAGNOSIS — N183 Chronic kidney disease, stage 3 (moderate): Secondary | ICD-10-CM | POA: Insufficient documentation

## 2018-05-11 DIAGNOSIS — C9310 Chronic myelomonocytic leukemia not having achieved remission: Secondary | ICD-10-CM | POA: Insufficient documentation

## 2018-05-11 LAB — COMPREHENSIVE METABOLIC PANEL
ALT: 13 U/L (ref 0–44)
AST: 17 U/L (ref 15–41)
Albumin: 3.6 g/dL (ref 3.5–5.0)
Alkaline Phosphatase: 51 U/L (ref 38–126)
Anion gap: 8 (ref 5–15)
BILIRUBIN TOTAL: 0.6 mg/dL (ref 0.3–1.2)
BUN: 30 mg/dL — ABNORMAL HIGH (ref 8–23)
CHLORIDE: 107 mmol/L (ref 98–111)
CO2: 28 mmol/L (ref 22–32)
Calcium: 9.1 mg/dL (ref 8.9–10.3)
Creatinine, Ser: 1.25 mg/dL — ABNORMAL HIGH (ref 0.44–1.00)
GFR, EST AFRICAN AMERICAN: 48 mL/min — AB (ref >60)
GFR, EST NON AFRICAN AMERICAN: 41 mL/min — AB (ref >60)
Glucose, Bld: 109 mg/dL — ABNORMAL HIGH (ref 70–99)
POTASSIUM: 4.1 mmol/L (ref 3.5–5.1)
Sodium: 143 mmol/L (ref 135–145)
TOTAL PROTEIN: 7.9 g/dL (ref 6.5–8.1)

## 2018-05-11 LAB — CBC WITH DIFFERENTIAL/PLATELET
BASOS ABS: 0 10*3/uL (ref 0–0.1)
Basophils Relative: 0 %
EOS ABS: 0.1 10*3/uL (ref 0–0.7)
EOS PCT: 1 %
HCT: 37.1 % (ref 35.0–47.0)
Hemoglobin: 11.8 g/dL — ABNORMAL LOW (ref 12.0–16.0)
Lymphocytes Relative: 13 %
Lymphs Abs: 1.1 10*3/uL (ref 1.0–3.6)
MCH: 28.7 pg (ref 26.0–34.0)
MCHC: 31.9 g/dL — ABNORMAL LOW (ref 32.0–36.0)
MCV: 89.8 fL (ref 80.0–100.0)
MONO ABS: 2.3 10*3/uL — AB (ref 0.2–0.9)
Monocytes Relative: 26 %
NEUTROS PCT: 60 %
Neutro Abs: 5.3 10*3/uL (ref 1.4–6.5)
PLATELETS: 122 10*3/uL — AB (ref 150–440)
RBC: 4.13 MIL/uL (ref 3.80–5.20)
RDW: 15 % — AB (ref 11.5–14.5)
WBC: 8.8 10*3/uL (ref 3.6–11.0)

## 2018-05-11 NOTE — Progress Notes (Signed)
[place] OFFICE PROGRESS NOTE  Patient Care Team: [name], MD as PCP - General (Family Medicine)  Cancer Staging No matching staging information was found for the patient.   Oncology History   #2013-? CHRONIC MYELOMONOCYTIC LEUKEMIA-1 [NOV 2016- BMBx; [name]; atypical monocytes with multilinege dysplasia; <5% blasts; [name];  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  Patient denies any unusual shortness of breath or cough.  Denies any headaches.    Review of Systems  Constitutional: Negative for chills, diaphoresis, fever, malaise/fatigue and weight loss.  HENT: Negative for nosebleeds and sore throat.   Eyes: Negative for double vision.  Respiratory: Negative for cough, hemoptysis, sputum production, shortness of breath and wheezing.   Cardiovascular: Negative for chest pain, palpitations, orthopnea and leg swelling.  Gastrointestinal: Negative for abdominal pain, blood in stool, constipation, diarrhea, heartburn, melena, nausea and vomiting.  Genitourinary: Negative for dysuria, frequency and urgency.  Musculoskeletal: Negative for back pain and joint pain.  Skin: Negative.  Negative for itching and rash.  Neurological: Negative for dizziness, tingling, focal weakness, weakness and headaches.  Endo/Heme/Allergies: Does not bruise/bleed easily.  Psychiatric/Behavioral: Negative for depression. The patient is not nervous/anxious and does not have insomnia.       PAST MEDICAL HISTORY :  Past Medical History:  Diagnosis Date  . Allergic rhinitis   . Arthritis   . CML (chronic myelocytic leukemia) ([place])    . GERD (gastroesophageal reflux disease)   . Gout   . History of goiter   . Hyperlipidemia   . Hypertension   . Hypothyroidism   . IDA (iron deficiency anemia)   . ITP (idiopathic thrombocytopenic purpura)   . Urinary incontinence     PAST SURGICAL HISTORY :   Past Surgical History:  Procedure Laterality Date  . INCISION AND DRAINAGE Right 06/05/2015   Procedure: INCISION AND DRAINAGE, RIGHT WRIST;  Surgeon: Milly Jakob, MD;  Location: [facility];  Service: Orthopedics;  Laterality: Right;    FAMILY HISTORY :   Family History  Problem Relation Age of Onset  . Heart attack Mother   . Hypertension Mother   . Cancer Father   . Hypertension Father   . Breast cancer Paternal Grandmother   . Diabetes Mellitus II Sister     SOCIAL HISTORY:   Social History   Tobacco Use  . Smoking status: Never Smoker  . Smokeless tobacco: Never Used  Substance Use Topics  . Alcohol use: No    Alcohol/week: 0.0 standard drinks  . Drug use: No    ALLERGIES:  is allergic to sulfa antibiotics; sulfamethoxazole-trimethoprim; and aspirin.  MEDICATIONS:  Current Outpatient Medications  Medication Sig Dispense Refill  . acetaminophen (TYLENOL) 650 MG CR tablet Take 1 tablet (650 mg total) by mouth every 6 (six) hours. 30 tablet 0  . amLODipine (NORVASC) 10 MG tablet Take 10 mg by mouth daily.     . Calcium Carbonate-Vitamin D (CALCIUM-VITAMIN D) 500-200 MG-UNIT tablet Take 1 tablet by mouth daily.    . Cholecalciferol (VITAMIN D3) 1000 units CAPS Take 1 capsule by mouth 1 day or 1 dose.    . docusate  sodium (COLACE) 100 MG capsule Take 100 mg by mouth 2 (two) times daily.    Marland Kitchen ethacrynic acid (EDECRIN) 25 MG tablet Take 1 tablet by mouth daily.    . febuxostat (ULORIC) 40 MG tablet Take 40 mg by mouth daily.     . Fish Oil-Cholecalciferol (FISH OIL + D3 PO) Take by mouth daily.    . fluticasone (FLONASE) 50 MCG/ACT nasal spray Place 1 spray into both nostrils daily. Reported on 06/17/6212  11  .  folic acid (FOLVITE) 1 MG tablet Take 1 tablet (1 mg total) by mouth daily. 30 tablet 0  . hydroxychloroquine (PLAQUENIL) 200 MG tablet Take 200 mg by mouth daily.    Marland Kitchen losartan (COZAAR) 25 MG tablet Take 25 mg by mouth daily.     . metoprolol tartrate (LOPRESSOR) 25 MG tablet Take 25 mg by mouth 2 (two) times daily. Reported on 12/18/2015    . Multiple Vitamins-Minerals (ICAPS) CAPS Take 1 capsule by mouth 1 day or 1 dose.    . oxybutynin (DITROPAN) 5 MG tablet Take 5 mg by mouth daily.  5  . pantoprazole (PROTONIX) 40 MG tablet Take 40 mg by mouth daily before breakfast.    . Polyethyl Glycol-Propyl Glycol (SYSTANE) 0.4-0.3 % SOLN Apply to eye.    . potassium chloride (K-DUR,KLOR-CON) 10 MEQ tablet Take 1 tablet by mouth daily.  11  . Propylene Glycol (SYSTANE BALANCE OP) Apply 1 drop to eye daily.    . ranitidine (ZANTAC) 300 MG tablet Take 1 tablet by mouth daily.     No current facility-administered medications for this visit.     PHYSICAL EXAMINATION: ECOG PERFORMANCE STATUS: 0 - Asymptomatic  BP (!) 183/92   Pulse (!) 56   Temp 98 F (36.7 C) (Tympanic)   Resp 18   Ht 5\' 5"  (1.651 m)   Wt 148 lb 14.4 oz (67.5 kg)   BMI 24.78 kg/m   Filed Weights   05/11/18 0925  Weight: 148 lb 14.4 oz (67.5 kg)    GENERAL: Well-nourished well-developed; Alert, no distress and comfortable.  Alone.  EYES: no pallor or icterus OROPHARYNX: no thrush or ulceration; NECK: supple; no lymph nodes felt. LYMPH:  no palpable lymphadenopathy in the axillary or inguinal regions LUNGS: Decreased breath sounds auscultation bilaterally. No wheeze or crackles HEART/CVS: regular rate & rhythm and no murmurs; No lower extremity edema ABDOMEN:abdomen soft, non-tender and normal bowel sounds. No hepatomegaly or splenomegaly.  Musculoskeletal:no cyanosis of digits and no clubbing  PSYCH: alert & oriented x 3 with fluent speech NEURO: no focal motor/sensory deficits SKIN:  no rashes or significant  lesions    LABORATORY DATA:  I have reviewed the data as listed    Component Value Date/Time   NA 143 05/11/2018 0917   NA 140 03/14/2014 1150   K 4.1 05/11/2018 0917   K 4.1 04/11/2014 1037   CL 107 05/11/2018 0917   CL 104 03/14/2014 1150   CO2 28 05/11/2018 0917   CO2 28 03/14/2014 1150   GLUCOSE 109 (H) 05/11/2018 0917   GLUCOSE 86 03/14/2014 1150   BUN 30 (H) 05/11/2018 0917   BUN 35 (H) 03/14/2014 1150   CREATININE 1.25 (H) 05/11/2018 0917   CREATININE 1.63 (H) 10/17/2014 1056   CALCIUM 9.1 05/11/2018 0917   CALCIUM 8.8 03/14/2014 1150   PROT 7.9 05/11/2018 0917   PROT 9.1 (H) 06/28/2013 1628   ALBUMIN 3.6 05/11/2018 0917   ALBUMIN 2.6 (L) 06/28/2013 1628   AST  17 05/11/2018 0917   AST 16 06/28/2013 1628   ALT 13 05/11/2018 0917   ALT 12 06/28/2013 1628   ALKPHOS 51 05/11/2018 0917   ALKPHOS 46 09/27/2013 0929   BILITOT 0.6 05/11/2018 0917   BILITOT 0.3 06/16/2014 1220   GFRNONAA 41 (L) 05/11/2018 0917   GFRNONAA 33 (L) 10/17/2014 1056   GFRNONAA 31 (L) 06/20/2014 1356   GFRAA 48 (L) 05/11/2018 0917   GFRAA 40 (L) 10/17/2014 1056   GFRAA 36 (L) 06/20/2014 1356    No results found for: SPEP, UPEP  Lab Results  Component Value Date   WBC 8.8 05/11/2018   NEUTROABS 5.3 05/11/2018   HGB 11.8 (L) 05/11/2018   HCT 37.1 05/11/2018   MCV 89.8 05/11/2018   PLT 122 (L) 05/11/2018      Chemistry      Component Value Date/Time   NA 143 05/11/2018 0917   NA 140 03/14/2014 1150   K 4.1 05/11/2018 0917   K 4.1 04/11/2014 1037   CL 107 05/11/2018 0917   CL 104 03/14/2014 1150   CO2 28 05/11/2018 0917   CO2 28 03/14/2014 1150   BUN 30 (H) 05/11/2018 0917   BUN 35 (H) 03/14/2014 1150   CREATININE 1.25 (H) 05/11/2018 0917   CREATININE 1.63 (H) 10/17/2014 1056      Component Value Date/Time   CALCIUM 9.1 05/11/2018 0917   CALCIUM 8.8 03/14/2014 1150   ALKPHOS 51 05/11/2018 0917   ALKPHOS 46 09/27/2013 0929   AST 17 05/11/2018 0917   AST 16 06/28/2013  1628   ALT 13 05/11/2018 0917   ALT 12 06/28/2013 1628   BILITOT 0.6 05/11/2018 0917   BILITOT 0.3 06/16/2014 1220       RADIOGRAPHIC STUDIES: I have personally reviewed the radiological images as listed and agreed with the findings in the report. No results found.   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 Today hemoglobin is 11.8; white count 8/mild monocytosis/ platelets 122 - STABLE.    On surveillance.  No concern for any progression of disease at this time.  # CKD- stage III- stable.creat 1.3 today.   # Elevated Blood pressure- 183/92; STABLE. again reminded compliance with medications/diet.   # follow up in 6 months.   Cc; Dr.George.    Orders Placed This Encounter  Procedures  . CBC with Differential    Standing Status:   Standing    Number of Occurrences:   4    Standing Expiration Date:   05/11/2020  . Comprehensive metabolic panel    Standing Status:   Standing    Number of Occurrences:   4    Standing Expiration Date:   05/11/2020   All questions were answered. The patient knows to call the clinic with any problems, questions or concerns.      Cammie Sickle, MD 05/23/2018 5:02 PM

## 2018-05-11 NOTE — Assessment & Plan Note (Signed)
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 Today hemoglobin is 11.8; white count 8/mild monocytosis/ platelets 122 - STABLE.    On surveillance.  No concern for any progression of disease at this time.  # CKD- stage III- stable.creat 1.3 today.   # Elevated Blood pressure- 183/92; STABLE. again reminded compliance with medications/diet.   # follow up in 6 months.   Cc; Dr.George.

## 2018-06-15 ENCOUNTER — Other Ambulatory Visit: Payer: Self-pay | Admitting: Family Medicine

## 2018-06-15 DIAGNOSIS — Z1231 Encounter for screening mammogram for malignant neoplasm of breast: Secondary | ICD-10-CM

## 2018-06-23 ENCOUNTER — Ambulatory Visit
Admission: RE | Admit: 2018-06-23 | Discharge: 2018-06-23 | Disposition: A | Discharge status: Home / Self Care | Payer: Medicare HMO | Source: Ambulatory Visit | Attending: Family Medicine | Admitting: Family Medicine

## 2018-06-23 DIAGNOSIS — Z1231 Encounter for screening mammogram for malignant neoplasm of breast: Secondary | ICD-10-CM | POA: Diagnosis not present

## 2018-11-09 ENCOUNTER — Other Ambulatory Visit: Payer: Medicare HMO

## 2018-11-09 ENCOUNTER — Inpatient Hospital Stay: Payer: Medicare HMO | Admitting: Oncology

## 2018-11-16 ENCOUNTER — Other Ambulatory Visit: Payer: Self-pay

## 2018-11-16 ENCOUNTER — Encounter: Payer: Self-pay | Admitting: Internal Medicine

## 2018-11-16 ENCOUNTER — Inpatient Hospital Stay: Payer: Medicare HMO | Attending: Internal Medicine

## 2018-11-16 ENCOUNTER — Inpatient Hospital Stay (HOSPITAL_BASED_OUTPATIENT_CLINIC_OR_DEPARTMENT_OTHER): Payer: Medicare HMO | Admitting: Internal Medicine

## 2018-11-16 VITALS — BP 202/92 | HR 65 | Temp 97.9°F | Resp 20 | Ht 65.0 in | Wt 141.6 lb

## 2018-11-16 DIAGNOSIS — N183 Chronic kidney disease, stage 3 (moderate): Secondary | ICD-10-CM

## 2018-11-16 DIAGNOSIS — I129 Hypertensive chronic kidney disease with stage 1 through stage 4 chronic kidney disease, or unspecified chronic kidney disease: Secondary | ICD-10-CM | POA: Insufficient documentation

## 2018-11-16 DIAGNOSIS — D649 Anemia, unspecified: Secondary | ICD-10-CM

## 2018-11-16 DIAGNOSIS — C9310 Chronic myelomonocytic leukemia not having achieved remission: Secondary | ICD-10-CM | POA: Diagnosis not present

## 2018-11-16 LAB — COMPREHENSIVE METABOLIC PANEL
ALBUMIN: 3.6 g/dL (ref 3.5–5.0)
ALK PHOS: 43 U/L (ref 38–126)
ALT: 15 U/L (ref 0–44)
AST: 18 U/L (ref 15–41)
Anion gap: 8 (ref 5–15)
BILIRUBIN TOTAL: 1.2 mg/dL (ref 0.3–1.2)
BUN: 34 mg/dL — AB (ref 8–23)
CO2: 30 mmol/L (ref 22–32)
CREATININE: 1.46 mg/dL — AB (ref 0.44–1.00)
Calcium: 8.8 mg/dL — ABNORMAL LOW (ref 8.9–10.3)
Chloride: 103 mmol/L (ref 98–111)
GFR calc Af Amer: 40 mL/min — ABNORMAL LOW (ref >60)
GFR calc non Af Amer: 35 mL/min — ABNORMAL LOW (ref >60)
GLUCOSE: 134 mg/dL — AB (ref 70–99)
POTASSIUM: 3.6 mmol/L (ref 3.5–5.1)
Sodium: 141 mmol/L (ref 135–145)
TOTAL PROTEIN: 7.7 g/dL (ref 6.5–8.1)

## 2018-11-16 LAB — CBC WITH DIFFERENTIAL/PLATELET
ABS IMMATURE GRANULOCYTES: 0.03 10*3/uL (ref 0.00–0.07)
BASOS PCT: 1 %
Basophils Absolute: 0.1 10*3/uL (ref 0.0–0.1)
EOS PCT: 1 %
Eosinophils Absolute: 0.1 10*3/uL (ref 0.0–0.5)
HCT: 37.8 % (ref 36.0–46.0)
HEMOGLOBIN: 11.7 g/dL — AB (ref 12.0–15.0)
Immature Granulocytes: 0 %
Lymphocytes Relative: 12 %
Lymphs Abs: 1.1 10*3/uL (ref 0.7–4.0)
MCH: 29.6 pg (ref 26.0–34.0)
MCHC: 31 g/dL (ref 30.0–36.0)
MCV: 95.7 fL (ref 80.0–100.0)
MONO ABS: 2.6 10*3/uL — AB (ref 0.1–1.0)
MONOS PCT: 29 %
NEUTROS ABS: 5.1 10*3/uL (ref 1.7–7.7)
Neutrophils Relative %: 57 %
PLATELETS: 123 10*3/uL — AB (ref 150–400)
RBC: 3.95 MIL/uL (ref 3.87–5.11)
RDW: 14.3 % (ref 11.5–15.5)
WBC: 9 10*3/uL (ref 4.0–10.5)
nRBC: 0 % (ref 0.0–0.2)

## 2018-11-16 NOTE — Progress Notes (Signed)
[name] OFFICE PROGRESS NOTE  Patient Care Team: [name], MD as PCP - General (Family Medicine)  Cancer Staging No matching staging information was found for the patient.   Oncology History   #2013-? CHRONIC MYELOMONOCYTIC LEUKEMIA-1 [NOV 2016- BMBx; [name]; atypical monocytes with multilinege dysplasia; <5% blasts; [name];  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  Patient denies any unusual shortness of breath or cough.  Denies any nausea vomiting headaches.  Denies any unusual fatigue.  Denies any blood in stools black or stools.   Review of Systems  Constitutional: Negative for chills, diaphoresis, fever, malaise/fatigue and weight loss.  HENT: Negative for nosebleeds and sore throat.   Eyes: Negative for double vision.  Respiratory: Negative for cough, hemoptysis, sputum production, shortness of breath and wheezing.   Cardiovascular: Negative for chest pain, palpitations, orthopnea and leg swelling.  Gastrointestinal: Negative for abdominal pain, blood in stool, constipation, diarrhea, heartburn, melena, nausea and vomiting.  Genitourinary: Negative for dysuria, frequency and urgency.  Musculoskeletal: Positive for back pain and joint pain.  Skin: Negative.  Negative for itching and rash.  Neurological: Negative for dizziness, tingling, focal weakness, weakness and headaches.  Endo/Heme/Allergies: Does not bruise/bleed easily.  Psychiatric/Behavioral: Negative for depression. The patient is not nervous/anxious and does not have insomnia.       PAST MEDICAL HISTORY :  Past Medical History:   Diagnosis Date  . Allergic rhinitis   . Arthritis   . CML (chronic myelocytic leukemia) ([place])   . GERD (gastroesophageal reflux disease)   . Gout   . History of goiter   . Hyperlipidemia   . Hypertension   . Hypothyroidism   . IDA (iron deficiency anemia)   . ITP (idiopathic thrombocytopenic purpura)   . Urinary incontinence     PAST SURGICAL HISTORY :   Past Surgical History:  Procedure Laterality Date  . INCISION AND DRAINAGE Right 06/05/2015   Procedure: INCISION AND DRAINAGE, RIGHT WRIST;  Surgeon: Milly Jakob, MD;  Location: [facility];  Service: Orthopedics;  Laterality: Right;    FAMILY HISTORY :   Family History  Problem Relation Age of Onset  . Heart attack Mother   . Hypertension Mother   . Cancer Father   . Hypertension Father   . Breast cancer Paternal Grandmother   . Diabetes Mellitus II Sister     SOCIAL HISTORY:   Social History   Tobacco Use  . Smoking status: Never Smoker  . Smokeless tobacco: Never Used  Substance Use Topics  . Alcohol use: No    Alcohol/week: 0.0 standard drinks  . Drug use: No    ALLERGIES:  is allergic to sulfa antibiotics; sulfamethoxazole-trimethoprim; and aspirin.  MEDICATIONS:  Current Outpatient Medications  Medication Sig Dispense Refill  . acetaminophen (TYLENOL) 650 MG CR tablet Take 1 tablet (650 mg total) by mouth every 6 (six) hours. 30 tablet 0  . Calcium Carbonate-Vitamin D (CALCIUM-VITAMIN D) 500-200 MG-UNIT tablet Take 1 tablet by mouth daily.    . chlorthalidone (HYGROTON) 25 MG tablet Take 0.5 tablets by mouth daily.    . Cholecalciferol (VITAMIN D3) 1000 units CAPS Take  1 capsule by mouth 1 day or 1 dose.    . docusate sodium (COLACE) 100 MG capsule Take 100 mg by mouth 2 (two) times daily.    Marland Kitchen ethacrynic acid (EDECRIN) 25 MG tablet Take 1 tablet by mouth daily.    . febuxostat (ULORIC) 40 MG tablet Take 40 mg by mouth daily.     . fluticasone (FLONASE) 50 MCG/ACT nasal spray Place 1 spray into both  nostrils daily. Reported on 10/19/5100  11  . folic acid (FOLVITE) 1 MG tablet Take 1 tablet (1 mg total) by mouth daily. 30 tablet 0  . hydroxychloroquine (PLAQUENIL) 200 MG tablet Take 200 mg by mouth daily.    Marland Kitchen ipratropium (ATROVENT) 0.06 % nasal spray Place 2 sprays into both nostrils as needed. Head cold symptoms    . losartan (COZAAR) 25 MG tablet Take 25 mg by mouth daily.     . metoprolol tartrate (LOPRESSOR) 25 MG tablet Take 25 mg by mouth 2 (two) times daily. Reported on 12/18/2015    . Multiple Vitamins-Minerals (ICAPS) CAPS Take 1 capsule by mouth 1 day or 1 dose.    . oxybutynin (DITROPAN) 5 MG tablet Take 5 mg by mouth daily.  5  . pantoprazole (PROTONIX) 40 MG tablet Take 40 mg by mouth daily before breakfast.    . Polyethyl Glycol-Propyl Glycol (SYSTANE) 0.4-0.3 % SOLN Apply to eye.    . potassium chloride (K-DUR,KLOR-CON) 10 MEQ tablet Take 1 tablet by mouth daily.  11  . Propylene Glycol (SYSTANE BALANCE OP) Apply 1 drop to eye daily.    . ranitidine (ZANTAC) 300 MG tablet Take 1 tablet by mouth daily.    . Fish Oil-Cholecalciferol (FISH OIL + D3 PO) Take by mouth daily.     No current facility-administered medications for this visit.     PHYSICAL EXAMINATION: ECOG PERFORMANCE STATUS: 0 - Asymptomatic  BP (!) 202/92 (BP Location: Left Arm, Patient Position: Sitting)   Pulse 65   Temp 97.9 F (36.6 C) (Tympanic)   Resp 20   Ht 5\' 5"  (1.651 m)   Wt 141 lb 10.3 oz (64.3 kg)   BMI 23.57 kg/m   Filed Weights   11/16/18 1314  Weight: 141 lb 10.3 oz (64.3 kg)    Physical Exam  Constitutional: She is oriented to person, place, and time and well-developed, well-nourished, and in no distress.  Elderly frail-appearing female patient.  She is alone.  She walks with a cane.  HENT:  Head: Normocephalic and atraumatic.  Mouth/Throat: Oropharynx is clear and moist. No oropharyngeal exudate.  Eyes: Pupils are equal, round, and reactive to light.  Neck: Normal range of  motion. Neck supple.  Cardiovascular: Normal rate and regular rhythm.  Pulmonary/Chest: No respiratory distress. She has no wheezes.  Abdominal: Soft. Bowel sounds are normal. She exhibits no distension and no mass. There is no abdominal tenderness. There is no rebound and no guarding.  Musculoskeletal: Normal range of motion.        General: No tenderness or edema.  Neurological: She is alert and oriented to person, place, and time.  Skin: Skin is warm.  Psychiatric: Affect normal.       LABORATORY DATA:  I have reviewed the data as listed    Component Value Date/Time   NA 141 11/16/2018 1300   NA 140 03/14/2014 1150   K 3.6 11/16/2018 1300   K 4.1 04/11/2014 1037   CL 103 11/16/2018 1300   CL 104 03/14/2014 1150  CO2 30 11/16/2018 1300   CO2 28 03/14/2014 1150   GLUCOSE 134 (H) 11/16/2018 1300   GLUCOSE 86 03/14/2014 1150   BUN 34 (H) 11/16/2018 1300   BUN 35 (H) 03/14/2014 1150   CREATININE 1.46 (H) 11/16/2018 1300   CREATININE 1.63 (H) 10/17/2014 1056   CALCIUM 8.8 (L) 11/16/2018 1300   CALCIUM 8.8 03/14/2014 1150   PROT 7.7 11/16/2018 1300   PROT 9.1 (H) 06/28/2013 1628   ALBUMIN 3.6 11/16/2018 1300   ALBUMIN 2.6 (L) 06/28/2013 1628   AST 18 11/16/2018 1300   AST 16 06/28/2013 1628   ALT 15 11/16/2018 1300   ALT 12 06/28/2013 1628   ALKPHOS 43 11/16/2018 1300   ALKPHOS 46 09/27/2013 0929   BILITOT 1.2 11/16/2018 1300   BILITOT 0.3 06/16/2014 1220   GFRNONAA 35 (L) 11/16/2018 1300   GFRNONAA 33 (L) 10/17/2014 1056   GFRNONAA 31 (L) 06/20/2014 1356   GFRAA 40 (L) 11/16/2018 1300   GFRAA 40 (L) 10/17/2014 1056   GFRAA 36 (L) 06/20/2014 1356    No results found for: SPEP, UPEP  Lab Results  Component Value Date   WBC 9.0 11/16/2018   NEUTROABS 5.1 11/16/2018   HGB 11.7 (L) 11/16/2018   HCT 37.8 11/16/2018   MCV 95.7 11/16/2018   PLT 123 (L) 11/16/2018      Chemistry      Component Value Date/Time   NA 141 11/16/2018 1300   NA 140 03/14/2014 1150    K 3.6 11/16/2018 1300   K 4.1 04/11/2014 1037   CL 103 11/16/2018 1300   CL 104 03/14/2014 1150   CO2 30 11/16/2018 1300   CO2 28 03/14/2014 1150   BUN 34 (H) 11/16/2018 1300   BUN 35 (H) 03/14/2014 1150   CREATININE 1.46 (H) 11/16/2018 1300   CREATININE 1.63 (H) 10/17/2014 1056      Component Value Date/Time   CALCIUM 8.8 (L) 11/16/2018 1300   CALCIUM 8.8 03/14/2014 1150   ALKPHOS 43 11/16/2018 1300   ALKPHOS 46 09/27/2013 0929   AST 18 11/16/2018 1300   AST 16 06/28/2013 1628   ALT 15 11/16/2018 1300   ALT 12 06/28/2013 1628   BILITOT 1.2 11/16/2018 1300   BILITOT 0.3 06/16/2014 1220       RADIOGRAPHIC STUDIES: I have personally reviewed the radiological images as listed and agreed with the findings in the report. No results found.   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 Today hemoglobin is 11.8; white count 9/mild monocytosis/ platelets 122 stable.  Continue surveillance.  No clinical concerns of progression.  #Mild anemia hemoglobin 11.8-overall stable.  If worsening can consider epo agents.  However this has to be considered in the context of poorly controlled blood pressures.  # CKD- stage III-secondary to poorly controlled blood pressures.  STABLE. creat 1.4 today.   # Elevated Blood pressure-202/ 92; repeat BP 170/80; again reminded compliance with medications/diet.  Long discussion with patient regarding importance of optimal blood pressure control.  Discussed regarding risk of worsening renal insufficiency/stroke and heart attacks with poorly controlled blood pressures.  Recommend eating a log of blood pressures and taking to her to PCP.  # DISPOSITION:  # follow up in 6 months- Dr.C; cbc/cmp/ldh-Dr.B   Cc; Dr.George.    No orders of the defined types were placed in this encounter.  All questions were answered. The patient knows to call the  clinic  with any problems, questions or concerns.      Cammie Sickle, MD 11/16/2018 2:05 PM

## 2018-11-16 NOTE — Addendum Note (Signed)
Addended by: Sandria Bales B on: 11/16/2018 02:40 PM   Modules accepted: Orders

## 2018-11-16 NOTE — Assessment & Plan Note (Addendum)
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 Today hemoglobin is 11.8; white count 9/mild monocytosis/ platelets 122 stable.  Continue surveillance.  No clinical concerns of progression.  #Mild anemia hemoglobin 11.8-overall stable.  If worsening can consider epo agents.  However this has to be considered in the context of poorly controlled blood pressures.  # CKD- stage III-secondary to poorly controlled blood pressures.  STABLE. creat 1.4 today.   # Elevated Blood pressure-202/ 92; repeat BP 170/80; again reminded compliance with medications/diet.  Long discussion with patient regarding importance of optimal blood pressure control.  Discussed regarding risk of worsening renal insufficiency/stroke and heart attacks with poorly controlled blood pressures.  Recommend eating a log of blood pressures and taking to her to PCP.  # DISPOSITION:  # follow up in 6 months- Dr.C; cbc/cmp/ldh-Dr.B   Cc; Dr.George.

## 2018-12-13 ENCOUNTER — Other Ambulatory Visit: Payer: Self-pay | Admitting: Family Medicine

## 2018-12-13 DIAGNOSIS — M899 Disorder of bone, unspecified: Secondary | ICD-10-CM

## 2018-12-13 DIAGNOSIS — M949 Disorder of cartilage, unspecified: Principal | ICD-10-CM

## 2019-03-29 ENCOUNTER — Other Ambulatory Visit: Payer: Self-pay | Admitting: Family Medicine

## 2019-03-29 DIAGNOSIS — I10 Essential (primary) hypertension: Secondary | ICD-10-CM

## 2019-04-01 ENCOUNTER — Ambulatory Visit: Payer: Self-pay

## 2019-04-01 ENCOUNTER — Ambulatory Visit: Admission: RE | Admit: 2019-04-01 | Payer: Medicare (Managed Care) | Source: Ambulatory Visit

## 2019-04-01 ENCOUNTER — Ambulatory Visit: Payer: Medicare (Managed Care)

## 2019-05-16 NOTE — Progress Notes (Signed)
[facility]  [address] [place], [place] [address] Phone: [phone]  Fax: [phone]   Clinic Day:  05/18/2019  Referring physician: [name], MD  Chief Complaint: [name] is a 78 y.o. 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  HPI:   The patient was diagnosed with CMML in 2014. She presented with weight loss. WBC has ranged 25,000 - 30,000 with predominant monocytosis. Abdominal ultrasound on 04/20/2013 revealed a normal spleen (10.75 cm). She was originally followed by Dr. Inez Pilgrim.   She underwent a bone marrow biopsy on 08/22/2015. Pathology showed increased atypical monocytic cells (30%) with multi-lineage dysplaisa.  There was no significant myeloid abnormalities or increase in blasts (< 5%).   Cytogenetics were normal (74, XX).  Findings favored a diagnosis of CMML.   She has a history of chronic normocytic anemia. Etiology is thought to be chronic kidney disease (CKD). Aranesp was considered if the patient became symptomatic. She has previously been treated with Venofer and blood transfusions. She has received Venofer on 03/06/2015 and 03/28/2015.  Ferritin has been followed: 270 on 04/27/2013, 293 on 04/26/2013, 286 on 06/16/2014, 430 on 02/13/2015, 393 on 03/20/2015, 524 on 04/05/2015, 373 on 04/24/2015, 495 on 05/15/2015, and 659 on 07/03/2015.   She has a history of idiopathic thrombocytopenia purpura (ITP), with platelets consistently in the range of 90,000 - 130,000. She has previously been treated with prednisone, but did not tolerate it well.   The patient was last seen in the medical oncology clinic on 11/16/2018 by Dr. Rogue Bussing. At that time, she was doing well. She denied any shortness of breath or cough. She denied any fatigue. She notes back and joint pain.   BC revealed a hematocrit 37.8, hemoglobin 11.7, MCV 95.7, platelets 123,000, white count 9000 with an  ANC of 5100.  Absolute monocyte count was 2600.  She continued on surveillance.   Symptomatically, she is feeling "pretty good." She denies weight loss, fevers, or sweats. She denies headaches and notes she needs new glasses. She reports shortness of breath with exertion 2 weeks ago. She denies any cough. She denies any bleeding, blood in her stools, black stools, or vaginal bleeding. She notes arthritis in her wrists, hands, and legs and chronic back pain. She notes chronic urinary urgency. She denies any nausea, vomiting, or diarrhea but notes reflux.   She eats chicken daily, and green, leafy vegetables 3x weekly. She denies any ice pica. She denies any restless legs. She reports wounds on her bilateral lower extremities last fall that took a long time to heal.   She is followed in cardiology at the [facility] by Dr. Clayborn Bigness.    Past Medical History:  Diagnosis Date  . Allergic rhinitis   . Arthritis   . CML (chronic myelocytic leukemia) ([place])   . GERD (gastroesophageal reflux disease)   . Gout   . History of goiter   . Hyperlipidemia   . Hypertension   . Hypothyroidism   . IDA (iron deficiency anemia)   . ITP (idiopathic thrombocytopenic purpura)   . Urinary incontinence     Past Surgical History:  Procedure Laterality Date  . INCISION AND DRAINAGE Right 06/05/2015   Procedure: INCISION AND DRAINAGE, RIGHT WRIST;  Surgeon: Milly Jakob, MD;  Location: [place];  Service: Orthopedics;  Laterality: Right;    Family History  Problem Relation Age of Onset  . Heart attack Mother   . Hypertension  Mother   . Cancer Father   . Hypertension Father   . Breast cancer Paternal Grandmother   . Diabetes Mellitus II Sister     Social History:  reports that she has never smoked. She has never used smokeless tobacco. She reports that she does not drink alcohol or use drugs.She denies any alcohol or tobacco use. She denies any exposure to radiation or toxins. She is retired but used to  work in a [employer]. She lives in [place]. The patient is alone today.  Allergies:  Allergies  Allergen Reactions  . Sulfa Antibiotics Rash    Katherina Right Syndrome  . Sulfamethoxazole-Trimethoprim Hives and Rash    Avel Sensor   . Aspirin Rash    Current Medications: Current Outpatient Medications  Medication Sig Dispense Refill  . acetaminophen (TYLENOL) 650 MG CR tablet Take 1 tablet (650 mg total) by mouth every 6 (six) hours. 30 tablet 0  . Calcium Carbonate-Vitamin D (CALCIUM-VITAMIN D) 500-200 MG-UNIT tablet Take 1 tablet by mouth daily.    . chlorthalidone (HYGROTON) 25 MG tablet Take 0.5 tablets by mouth daily.    . Cholecalciferol (VITAMIN D3) 1000 units CAPS Take 1 capsule by mouth 1 day or 1 dose.    . docusate sodium (COLACE) 100 MG capsule Take 100 mg by mouth 2 (two) times daily.    Marland Kitchen ethacrynic acid (EDECRIN) 25 MG tablet Take 1 tablet by mouth daily.    . febuxostat (ULORIC) 40 MG tablet Take 40 mg by mouth daily.     . Fish Oil-Cholecalciferol (FISH OIL + D3 PO) Take by mouth daily.    . folic acid (FOLVITE) 1 MG tablet Take 1 tablet (1 mg total) by mouth daily. 30 tablet 0  . hydroxychloroquine (PLAQUENIL) 200 MG tablet Take 200 mg by mouth daily.    Marland Kitchen losartan (COZAAR) 25 MG tablet Take 25 mg by mouth daily.     . metoprolol tartrate (LOPRESSOR) 25 MG tablet Take 25 mg by mouth 2 (two) times daily. Reported on 12/18/2015    . Multiple Vitamins-Minerals (ICAPS) CAPS Take 1 capsule by mouth 1 day or 1 dose.    . oxybutynin (DITROPAN) 5 MG tablet Take 5 mg by mouth daily.  5  . pantoprazole (PROTONIX) 40 MG tablet Take 40 mg by mouth daily before breakfast.    . Polyethyl Glycol-Propyl Glycol (SYSTANE) 0.4-0.3 % SOLN Apply to eye.    . potassium chloride (K-DUR,KLOR-CON) 10 MEQ tablet Take 1 tablet by mouth daily.  11  . Propylene Glycol (SYSTANE BALANCE OP) Apply 1 drop to eye daily.    . ranitidine (ZANTAC) 300 MG tablet Take 1 tablet by mouth daily.    .  fluticasone (FLONASE) 50 MCG/ACT nasal spray Place 1 spray into both nostrils daily. Reported on 12/18/2015  11  . ipratropium (ATROVENT) 0.06 % nasal spray Place 2 sprays into both nostrils as needed. Head cold symptoms     No current facility-administered medications for this visit.     Review of Systems  Constitutional: Negative.  Negative for chills, diaphoresis, fever, malaise/fatigue and weight loss (up 5lbs).       Feels "pretty good."  HENT: Negative.  Negative for congestion, hearing loss, sinus pain and sore throat.   Eyes: Negative.  Negative for blurred vision.  Respiratory: Positive for shortness of breath (with exertion). Negative for cough and wheezing.   Cardiovascular: Positive for leg swelling (BLE). Negative for chest pain, palpitations, orthopnea and PND.  Gastrointestinal: Positive for  heartburn (reflux). Negative for abdominal pain, blood in stool, constipation, diarrhea, melena, nausea and vomiting.  Genitourinary: Positive for urgency. Negative for dysuria, frequency and hematuria.  Musculoskeletal: Positive for back pain and joint pain (arthritic, hands, wrists, legs). Negative for myalgias.  Skin: Negative.  Negative for itching and rash.  Neurological: Negative for dizziness, tingling, sensory change, weakness and headaches.  Endo/Heme/Allergies: Does not bruise/bleed easily.  Psychiatric/Behavioral: Negative for depression, memory loss and substance abuse. The patient is not nervous/anxious and does not have insomnia.   All other systems reviewed and are negative.  Performance status (ECOG): 1-2  Vitals Blood pressure (!) 161/63, pulse (!) 59, temperature (!) 97.1 F (36.2 C), temperature source Tympanic, resp. rate 18, height _0  (1.651 m), weight 146 lb 4.4 oz (66.3 kg), SpO2 100 %.   Physical Exam  Constitutional: She is oriented to person, place, and time. She appears well-developed and well-nourished. No distress.  Elderly frail-appearing female. She  walks with a cane.   HENT:  Head: Normocephalic and atraumatic.  Mouth/Throat: Oropharynx is clear and moist. No oropharyngeal exudate.  Short graying hair. Mask.  Eyes: Pupils are equal, round, and reactive to light. Conjunctivae and EOM are normal. No scleral icterus.  Brown eyes.   Neck: Normal range of motion. Neck supple.  Cardiovascular: Normal rate, regular rhythm and normal heart sounds.  No murmur heard. Pulmonary/Chest: Effort normal and breath sounds normal. No respiratory distress. She has no wheezes.  Abdominal: Soft. Bowel sounds are normal. She exhibits no distension. There is no splenomegaly or hepatomegaly. There is no abdominal tenderness.  Musculoskeletal: Normal range of motion.        General: Tenderness (BLE) and edema (2-3+ BLE, chronic L>R) present.     Comments: Right hand flexed.  Lymphadenopathy:    She has no cervical adenopathy.    She has no axillary adenopathy.       Right: No inguinal and no supraclavicular adenopathy present.       Left: No inguinal and no supraclavicular adenopathy present.  Neurological: She is alert and oriented to person, place, and time.  Skin: Skin is warm and dry. She is not diaphoretic. No erythema.  Psychiatric: She has a normal mood and affect. Her behavior is normal. Judgment and thought content normal.  Nursing note and vitals reviewed.   No visits with results within 3 Day(s) from this visit.  Latest known visit with results is:  Appointment on 11/16/2018  Component Date Value Ref Range Status  . Sodium 11/16/2018 141  135 - 145 mmol/L Final  . Potassium 11/16/2018 3.6  3.5 - 5.1 mmol/L Final  . Chloride 11/16/2018 103  98 - 111 mmol/L Final  . CO2 11/16/2018 30  22 - 32 mmol/L Final  . Glucose, Bld 11/16/2018 134* 70 - 99 mg/dL Final  . BUN 11/16/2018 34* 8 - 23 mg/dL Final  . Creatinine, Ser 11/16/2018 1.46* 0.44 - 1.00 mg/dL Final  . Calcium 11/16/2018 8.8* 8.9 - 10.3 mg/dL Final  . Total Protein 11/16/2018 7.7   6.5 - 8.1 g/dL Final  . Albumin 11/16/2018 3.6  3.5 - 5.0 g/dL Final  . AST 11/16/2018 18  15 - 41 U/L Final  . ALT 11/16/2018 15  0 - 44 U/L Final  . Alkaline Phosphatase 11/16/2018 43  38 - 126 U/L Final  . Total Bilirubin 11/16/2018 1.2  0.3 - 1.2 mg/dL Final  . GFR calc non Af Amer 11/16/2018 35* >60 mL/min Final  . GFR calc  Af Amer 11/16/2018 40* >60 mL/min Final  . Anion gap 11/16/2018 8  5 - 15 Final   Performed at [facility], [address], [place], [place] [address]  . WBC 11/16/2018 9.0  4.0 - 10.5 K/uL Final  . RBC 11/16/2018 3.95  3.87 - 5.11 MIL/uL Final  . Hemoglobin 11/16/2018 11.7* 12.0 - 15.0 g/dL Final  . HCT 11/16/2018 37.8  36.0 - 46.0 % Final  . MCV 11/16/2018 95.7  80.0 - 100.0 fL Final  . MCH 11/16/2018 29.6  26.0 - 34.0 pg Final  . MCHC 11/16/2018 31.0  30.0 - 36.0 g/dL Final  . RDW 11/16/2018 14.3  11.5 - 15.5 % Final  . Platelets 11/16/2018 123* 150 - 400 K/uL Final   Comment: Immature Platelet Fraction may be clinically indicated, consider ordering this additional test [id number]   . nRBC 11/16/2018 0.0  0.0 - 0.2 % Final  . Neutrophils Relative % 11/16/2018 57  % Final  . Neutro Abs 11/16/2018 5.1  1.7 - 7.7 K/uL Final  . Lymphocytes Relative 11/16/2018 12  % Final  . Lymphs Abs 11/16/2018 1.1  0.7 - 4.0 K/uL Final  . Monocytes Relative 11/16/2018 29  % Final  . Monocytes Absolute 11/16/2018 2.6* 0.1 - 1.0 K/uL Final  . Eosinophils Relative 11/16/2018 1  % Final  . Eosinophils Absolute 11/16/2018 0.1  0.0 - 0.5 K/uL Final  . Basophils Relative 11/16/2018 1  % Final  . Basophils Absolute 11/16/2018 0.1  0.0 - 0.1 K/uL Final  . Immature Granulocytes 11/16/2018 0  % Final  . Abs Immature Granulocytes 11/16/2018 0.03  0.00 - 0.07 K/uL Final   Performed at [facility], [address], [place], [place] [address]    Assessment:  [name] is a 78 y.o. 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 She presented with weight loss. WBC  has ranged 25,000 - 30,000 with predominant monocytosis.   Bone marrow aspirate and biopsy on 08/22/2015 revealed an increased atypical monocytic cells (30%) with multi-lineage dysplaisa.  There was no significant myeloid abnormalities or increase in blasts (< 5%).   Cytogenetics were normal (74, XX).  Findings favored a diagnosis of CMML.   She has a history of chronic normocytic anemia felt secondary to chronic kidney disease (CKD).   She has previously been treated with Venofer and blood transfusions. She has received Venofer on 03/06/2015 and 03/28/2015.  Ferritin has been followed: 270 on 04/27/2013, 293 on 04/26/2013, 286 on 06/16/2014, 430 on 02/13/2015, 393 on 03/20/2015, 524 on 04/05/2015, 373 on 04/24/2015, 495 on 05/15/2015, and 659 on 07/03/2015.   She has a history of idiopathic thrombocytopenia purpura (ITP).  Platelet count has ranged between 90,000 - 130,000.  She was previously treated with prednisone.   Symptomatically, she denies any fevers, sweats or weight loss.  She has shortness of breath on exertion.  Exam reveals no adenopathy or hepatosplenomegaly.  Plan: 1. Labs today: CBC with diff, CMP, LDH.  2. Chronic myelomonocytic leukemia (CMML)    Review entire medical history, diagnosis and management of CMML.  Review of marrow and labs suggests CMML-0.  Patient appears to have low risk disease.   Risk factors:  Age (> 37), WBC (> 15,000), hemoglobin (< 10), platelet count (< 100,000), ASXL mutation.   Risk factors for patient include age only.  Discuss indications for treatment:   B symptoms, change in counts (increased WBC/blasts, increased cytopenias), and organ involvement.   Treatment typically consists of  hydroxyurea or hypomethylating agents.  Discuss plan for ongoing surveillance. 3. RTC in 6 months for MD assessment and labs (CBC with diff, CMP, LDH).  I discussed the assessment and treatment plan with the patient.  The patient was provided an opportunity to ask  questions and all were answered.  The patient agreed with the plan and demonstrated an understanding of the instructions.  The patient was advised to call back if the symptoms worsen or if the condition fails to improve as anticipated.   Lequita Asal, MD, PhD    05/18/2019, 11:45 AM  I, Luster Landsberg, am acting as Education administrator for [employer]. Mike Gip, MD, PhD.  I, Melissa C. Mike Gip, MD, have reviewed the above documentation for accuracy and completeness, and I agree with the above.

## 2019-05-17 ENCOUNTER — Other Ambulatory Visit: Payer: Medicare HMO

## 2019-05-18 ENCOUNTER — Other Ambulatory Visit: Payer: Self-pay

## 2019-05-18 ENCOUNTER — Telehealth: Payer: Self-pay

## 2019-05-18 ENCOUNTER — Inpatient Hospital Stay (HOSPITAL_BASED_OUTPATIENT_CLINIC_OR_DEPARTMENT_OTHER): Payer: Medicare (Managed Care) | Admitting: Hematology and Oncology

## 2019-05-18 ENCOUNTER — Encounter: Payer: Self-pay | Admitting: Hematology and Oncology

## 2019-05-18 ENCOUNTER — Inpatient Hospital Stay: Payer: Medicare (Managed Care) | Attending: Hematology and Oncology

## 2019-05-18 VITALS — BP 161/63 | HR 59 | Temp 97.1°F | Resp 18 | Ht 65.0 in | Wt 146.3 lb

## 2019-05-18 DIAGNOSIS — D649 Anemia, unspecified: Secondary | ICD-10-CM | POA: Insufficient documentation

## 2019-05-18 DIAGNOSIS — M19042 Primary osteoarthritis, left hand: Secondary | ICD-10-CM | POA: Diagnosis not present

## 2019-05-18 DIAGNOSIS — G8929 Other chronic pain: Secondary | ICD-10-CM | POA: Diagnosis not present

## 2019-05-18 DIAGNOSIS — M19041 Primary osteoarthritis, right hand: Secondary | ICD-10-CM | POA: Diagnosis not present

## 2019-05-18 DIAGNOSIS — M19032 Primary osteoarthritis, left wrist: Secondary | ICD-10-CM | POA: Diagnosis not present

## 2019-05-18 DIAGNOSIS — M19031 Primary osteoarthritis, right wrist: Secondary | ICD-10-CM | POA: Diagnosis not present

## 2019-05-18 DIAGNOSIS — C9310 Chronic myelomonocytic leukemia not having achieved remission: Secondary | ICD-10-CM

## 2019-05-18 DIAGNOSIS — M545 Low back pain: Secondary | ICD-10-CM | POA: Insufficient documentation

## 2019-05-18 DIAGNOSIS — Z803 Family history of malignant neoplasm of breast: Secondary | ICD-10-CM | POA: Diagnosis not present

## 2019-05-18 DIAGNOSIS — D693 Immune thrombocytopenic purpura: Secondary | ICD-10-CM | POA: Diagnosis not present

## 2019-05-18 DIAGNOSIS — M2550 Pain in unspecified joint: Secondary | ICD-10-CM | POA: Insufficient documentation

## 2019-05-18 LAB — CBC WITH DIFFERENTIAL/PLATELET
Abs Immature Granulocytes: 0.05 10*3/uL (ref 0.00–0.07)
Basophils Absolute: 0.1 10*3/uL (ref 0.0–0.1)
Basophils Relative: 1 %
Eosinophils Absolute: 0.2 10*3/uL (ref 0.0–0.5)
Eosinophils Relative: 1 %
HCT: 35.7 % — ABNORMAL LOW (ref 36.0–46.0)
Hemoglobin: 11 g/dL — ABNORMAL LOW (ref 12.0–15.0)
Immature Granulocytes: 1 %
Lymphocytes Relative: 8 %
Lymphs Abs: 0.9 10*3/uL (ref 0.7–4.0)
MCH: 28.5 pg (ref 26.0–34.0)
MCHC: 30.8 g/dL (ref 30.0–36.0)
MCV: 92.5 fL (ref 80.0–100.0)
Monocytes Absolute: 3.6 10*3/uL — ABNORMAL HIGH (ref 0.1–1.0)
Monocytes Relative: 33 %
Neutro Abs: 6.2 10*3/uL (ref 1.7–7.7)
Neutrophils Relative %: 56 %
Platelets: 141 10*3/uL — ABNORMAL LOW (ref 150–400)
RBC: 3.86 MIL/uL — ABNORMAL LOW (ref 3.87–5.11)
RDW: 15.5 % (ref 11.5–15.5)
WBC: 11 10*3/uL — ABNORMAL HIGH (ref 4.0–10.5)
nRBC: 0 % (ref 0.0–0.2)

## 2019-05-18 LAB — COMPREHENSIVE METABOLIC PANEL
ALT: 16 U/L (ref 0–44)
AST: 14 U/L — ABNORMAL LOW (ref 15–41)
Albumin: 3.5 g/dL (ref 3.5–5.0)
Alkaline Phosphatase: 46 U/L (ref 38–126)
Anion gap: 9 (ref 5–15)
BUN: 28 mg/dL — ABNORMAL HIGH (ref 8–23)
CO2: 29 mmol/L (ref 22–32)
Calcium: 8.8 mg/dL — ABNORMAL LOW (ref 8.9–10.3)
Chloride: 100 mmol/L (ref 98–111)
Creatinine, Ser: 1.31 mg/dL — ABNORMAL HIGH (ref 0.44–1.00)
GFR calc Af Amer: 46 mL/min — ABNORMAL LOW (ref >60)
GFR calc non Af Amer: 39 mL/min — ABNORMAL LOW (ref >60)
Glucose, Bld: 87 mg/dL (ref 70–99)
Potassium: 3.8 mmol/L (ref 3.5–5.1)
Sodium: 138 mmol/L (ref 135–145)
Total Bilirubin: 0.6 mg/dL (ref 0.3–1.2)
Total Protein: 7.6 g/dL (ref 6.5–8.1)

## 2019-05-18 LAB — LACTATE DEHYDROGENASE: LDH: 115 U/L (ref 98–192)

## 2019-05-18 NOTE — Progress Notes (Signed)
No new changes noted today 

## 2019-05-18 NOTE — Telephone Encounter (Signed)
Per Dr Mike Gip  have reached out to the wound clinic and they states that they will need to the office note from today so they can get her schedule. I have informed Dr Mike Gip the her encounter for today need to closed so I can fax it to the wound clinic.

## 2019-06-02 ENCOUNTER — Encounter: Payer: Medicare (Managed Care) | Attending: Physician Assistant | Admitting: Physician Assistant

## 2019-06-02 ENCOUNTER — Other Ambulatory Visit: Payer: Self-pay

## 2019-06-02 DIAGNOSIS — L97822 Non-pressure chronic ulcer of other part of left lower leg with fat layer exposed: Secondary | ICD-10-CM | POA: Diagnosis present

## 2019-06-02 DIAGNOSIS — I499 Cardiac arrhythmia, unspecified: Secondary | ICD-10-CM | POA: Diagnosis not present

## 2019-06-02 DIAGNOSIS — M109 Gout, unspecified: Secondary | ICD-10-CM | POA: Diagnosis not present

## 2019-06-02 DIAGNOSIS — Z809 Family history of malignant neoplasm, unspecified: Secondary | ICD-10-CM | POA: Insufficient documentation

## 2019-06-02 DIAGNOSIS — I890 Lymphedema, not elsewhere classified: Secondary | ICD-10-CM | POA: Diagnosis not present

## 2019-06-02 DIAGNOSIS — Z836 Family history of other diseases of the respiratory system: Secondary | ICD-10-CM | POA: Diagnosis not present

## 2019-06-02 DIAGNOSIS — I10 Essential (primary) hypertension: Secondary | ICD-10-CM | POA: Diagnosis not present

## 2019-06-02 DIAGNOSIS — Z8249 Family history of ischemic heart disease and other diseases of the circulatory system: Secondary | ICD-10-CM | POA: Insufficient documentation

## 2019-06-02 DIAGNOSIS — M069 Rheumatoid arthritis, unspecified: Secondary | ICD-10-CM | POA: Diagnosis not present

## 2019-06-02 NOTE — Progress Notes (Signed)
DANIE, HANNIG ([id number]) Visit Report for 06/02/2019 Abuse/Suicide Risk Screen Details Patient Name: DEMETRIA, IWAI Date of Service: 06/02/2019 9:45 AM Medical Record Number: [id number] Patient Account Number: [id number] Date of Birth/Sex: 1941-05-24 (78 y.o. F) Treating RN: Montey Hora Primary Care Lynnet Hefley: Salome Holmes Other Clinician: Referring Nyjai Graff: Nolon Stalls Treating Ayannah Faddis/Extender: Melburn Hake, HOYT Weeks in Treatment: 0 Abuse/Suicide Risk Screen Items Answer ABUSE RISK SCREEN: Has anyone close to you tried to hurt or harm you recentlyo No Do you feel uncomfortable with anyone in your familyo No Has anyone forced you do things that you didnot want to doo No Electronic Signature(s) Signed: 06/02/2019 4:23:33 PM By: Montey Hora Entered By: Montey Hora on 06/02/2019 10:07:36 Griswold, Mardie Jerilynn Mages ([id number]) -------------------------------------------------------------------------------- Activities of Daily Living Details Patient Name: [name] Date of Service: 06/02/2019 9:45 AM Medical Record Number: [id number] Patient Account Number: [id number] Date of Birth/Sex: 1940/12/26 (78 y.o. F) Treating RN: Montey Hora Primary Care Yanni Ruberg: Salome Holmes Other Clinician: Referring Sharai Overbay: Nolon Stalls Treating Asif Muchow/Extender: Melburn Hake, HOYT Weeks in Treatment: 0 Activities of Daily Living Items Answer Activities of Daily Living (Please select one for each item) Drive Automobile Not Able Take Medications Completely Able Use Telephone Completely Able Care for Appearance Completely Able Use Toilet Completely Able Bath / Shower Need Assistance Dress Self Completely Able Feed Self Completely Able Walk Need Assistance Get In / Out Bed Completely [place] for Self Need Assistance Electronic Signature(s) Signed: 06/02/2019 4:23:33 PM By: Montey Hora Entered By: Montey Hora on 06/02/2019 10:08:09 Mcintyre, [name] ([id number]) -------------------------------------------------------------------------------- Education Screening Details Patient Name: [name] Date of Service: 06/02/2019 9:45 AM Medical Record Number: [id number] Patient Account Number: [id number] Date of Birth/Sex: Nov 17, 1940 (78 y.o. F) Treating RN: Montey Hora Primary Care Yecheskel Kurek: Salome Holmes Other Clinician: Referring Sherrita Riederer: Nolon Stalls Treating Mitra Duling/Extender: Melburn Hake, HOYT Weeks in Treatment: 0 Primary Learner Assessed: Patient Learning Preferences/Education Level/Primary Language Learning Preference: Explanation, Demonstration Highest Education Level: High School Preferred Language: English Cognitive Barrier Language Barrier: No Translator Needed: No Memory Deficit: No Emotional Barrier: No Cultural/Religious Beliefs Affecting Medical Care: No Physical Barrier Impaired Vision: No Impaired Hearing: No Decreased Hand dexterity: No Knowledge/Comprehension Knowledge Level: Medium Comprehension Level: Medium Ability to understand written Medium instructions: Ability to understand verbal Medium instructions: Motivation Anxiety Level: Calm Cooperation: Cooperative Education Importance: Acknowledges Need Interest in Health Problems: Asks Questions Perception: Coherent Willingness to Engage in Self- Medium Management Activities: Readiness to Engage in Self- Medium Management Activities: Electronic Signature(s) Signed: 06/02/2019 4:23:33 PM By: Montey Hora Entered By: Montey Hora on 06/02/2019 10:08:37 Sugarman, Cherina Jerilynn Mages ([id number]) -------------------------------------------------------------------------------- Fall Risk Assessment Details Patient Name: [name] Date of Service: 06/02/2019 9:45 AM Medical Record Number: [id number] Patient Account Number: [id number] Date of Birth/Sex: December 18, 1940 (78 y.o.  F) Treating RN: Montey Hora Primary Care Ben Sanz: Salome Holmes Other Clinician: Referring Daiki Dicostanzo: Nolon Stalls Treating Kaide Gage/Extender: Melburn Hake, HOYT Weeks in Treatment: 0 Fall Risk Assessment Items Have you had 2 or more falls in the last 12 monthso 0 No Have you had any fall that resulted in injury in the last 12 monthso 0 No FALLS RISK SCREEN History of falling - immediate or within 3 months 0 No Secondary diagnosis (Do you have 2 or more medical diagnoseso) 0 No Ambulatory aid None/bed rest/wheelchair/nurse 0 Yes Crutches/cane/walker 0 No Furniture 0 No Intravenous therapy Access/Saline/Heparin Lock 0 No Gait/Transferring Normal/ bed rest/ wheelchair 0 No Weak (  short steps with or without shuffle, stooped but able to lift head while 10 Yes walking, may seek support from furniture) Impaired (short steps with shuffle, may have difficulty arising from chair, head 0 No down, impaired balance) Mental Status Oriented to own ability 0 Yes Electronic Signature(s) Signed: 06/02/2019 4:23:33 PM By: Montey Hora Entered By: Montey Hora on 06/02/2019 10:09:08 Verdone, [name] ([id number]) -------------------------------------------------------------------------------- Foot Assessment Details Patient Name: [name] Date of Service: 06/02/2019 9:45 AM Medical Record Number: [id number] Patient Account Number: [id number] Date of Birth/Sex: [birth year] (78 y.o. F) Treating RN: Montey Hora Primary Care Charna Neeb: Salome Holmes Other Clinician: Referring Heidi Lemay: Nolon Stalls Treating Casimer Russett/Extender: Melburn Hake, HOYT Weeks in Treatment: 0 Foot Assessment Items Site Locations + = Sensation present, - = Sensation absent, C = Callus, U = Ulcer R = Redness, W = Warmth, M = Maceration, PU = Pre-ulcerative lesion F = Fissure, S = Swelling, D = Dryness Assessment Right: Left: Other Deformity: No No Prior Foot Ulcer: No No Prior Amputation: No No Charcot  Joint: No No Ambulatory Status: Ambulatory With Help Assistance Device: Cane Gait: Steady Electronic Signature(s) Signed: 06/02/2019 4:23:33 PM By: Montey Hora Entered By: Montey Hora on 06/02/2019 10:10:53 Lobosco, Janaye Jerilynn Mages ([id number]) -------------------------------------------------------------------------------- Nutrition Risk Screening Details Patient Name: [name] Date of Service: 06/02/2019 9:45 AM Medical Record Number: [id number] Patient Account Number: [id number] Date of Birth/Sex: 10-30-1940 (78 y.o. F) Treating RN: Montey Hora Primary Care Josehua Hammar: Salome Holmes Other Clinician: Referring Millianna Szymborski: Nolon Stalls Treating Elodie Panameno/Extender: Melburn Hake, HOYT Weeks in Treatment: 0 Height (in): 65 Weight (lbs): 147 Body Mass Index (BMI): 24.5 Nutrition Risk Screening Items Score Screening NUTRITION RISK SCREEN: I have an illness or condition that made me change the kind and/or amount of 0 No food I eat I eat fewer than two meals per day 0 No I eat few fruits and vegetables, or milk products 0 No I have three or more drinks of beer, liquor or wine almost every day 0 No I have tooth or mouth problems that make it hard for me to eat 0 No I don't always have enough money to buy the food I need 0 No I eat alone most of the time 0 No I take three or more different prescribed or over-the-counter drugs a day 1 Yes Without wanting to, I have lost or gained 10 pounds in the last six months 0 No I am not always physically able to shop, cook and/or feed myself 0 No Nutrition Protocols Good Risk Protocol 0 No interventions needed Moderate Risk Protocol High Risk Proctocol Risk Level: Good Risk Score: 1 Electronic Signature(s) Signed: 06/02/2019 4:23:33 PM By: Montey Hora Entered By: Montey Hora on 06/02/2019 10:09:16

## 2019-06-02 NOTE — Progress Notes (Addendum)
JALAYA, SARVER ([id number]) Visit Report for 06/02/2019 Allergy List Details Patient Name: ANJELA, CASSARA Date of Service: 06/02/2019 9:45 AM Medical Record Number: [id number] Patient Account Number: [id number] Date of Birth/Sex: 1941/04/25 (78 y.o. F) Treating RN: Montey Hora Primary Care Aishi Courts: Salome Holmes Other Clinician: Referring Jaydyn Bozzo: Referral, Self Treating Lyden Redner/Extender: STONE III, HOYT Weeks in Treatment: 0 Allergies Active Allergies No Known Drug Allergies Allergy Notes Electronic Signature(s) Signed: 06/02/2019 4:23:33 PM By: Montey Hora Entered By: Montey Hora on 06/02/2019 10:06:33 [place], [name] ([id number]) -------------------------------------------------------------------------------- Arrival Information Details Patient Name: [name] Date of Service: 06/02/2019 9:45 AM Medical Record Number: [id number] Patient Account Number: [id number] Date of Birth/Sex: 08/28/1941 (78 y.o. F) Treating RN: Montey Hora Primary Care Almira P[place]: Salome Holmes Other Clinician: Referring Valiant Dills: Referral, Self Treating Shekera Beavers/Extender: Melburn Hake, HOYT Weeks in Treatment: 0 Visit Information Patient Arrived: Cane Arrival Time: 10:03 Accompanied By: self Transfer Assistance: None Patient Identification Verified: Yes Secondary Verification Process Completed: Yes Patient Has Alerts: Yes Patient Alerts: 06/02/19 ABI L 0.84 R 0.82 History Since Last Visit Added or deleted any medications: No Any new allergies or adverse reactions: No Had a fall or experienced change in activities of daily living that may affect risk of falls: No Signs or symptoms of abuse/neglect since last visito No Hospitalized since last visit: No Implantable device outside of the clinic excluding cellular tissue based products placed in the center since last visit: No Has Compression in Place as Prescribed: No Electronic Signature(s) Signed: 01/25/2020 3:19:55 PM By: Gretta Cool, BSN,  RN, CWS, Kim RN, BSN Previous Signature: 06/02/2019 4:23:33 PM Version By: Montey Hora Entered By: Gretta Cool, BSN, RN, CWS, Kim on 01/25/2020 15:19:55 Kruer, [name] ([id number]) -------------------------------------------------------------------------------- Clinic Level of Care Assessment Details Patient Name: [name] Date of Service: 06/02/2019 9:45 AM Medical Record Number: [id number] Patient Account Number: [id number] Date of Birth/Sex: [birth year] (78 y.o. F) Treating RN: Army Melia Primary Care Nataliee Shurtz: Salome Holmes Other Clinician: Referring Mallarie Voorhies: Referral, Self Treating Maziyah Vessel/Extender: Melburn Hake, HOYT Weeks in Treatment: 0 Clinic Level of Care Assessment Items TOOL 1 Quantity Score []  - Use when EandM and Procedure is performed on INITIAL visit 0 ASSESSMENTS - Nursing Assessment / Reassessment X - General Physical Exam (combine w/ comprehensive assessment (listed just below) when performed on new pt. 1 20 evals) X- 1 25 Comprehensive Assessment (HX, ROS, Risk Assessments, Wounds Hx, etc.) ASSESSMENTS - Wound and Skin Assessment / Reassessment []  - Dermatologic / Skin Assessment (not related to wound area) 0 ASSESSMENTS - Ostomy and/or Continence Assessment and Care []  - Incontinence Assessment and Management 0 []  - 0 Ostomy Care Assessment and Management (repouching, etc.) PROCESS - Coordination of Care X - Simple Patient / Family Education for ongoing care 1 15 []  - 0 Complex (extensive) Patient / Family Education for ongoing care X- 1 10 Staff obtains Programmer, systems, Records, Test Results / Process Orders []  - 0 Staff telephones HHA, Nursing Homes / Clarify orders / etc []  - 0 Routine Transfer to another Facility (non-emergent condition) []  - 0 Routine Hospital Admission (non-emergent condition) X- 1 15 New Admissions / Biomedical engineer / Ordering NPWT, Apligraf, etc. []  - 0 Emergency Hospital Admission (emergent condition) PROCESS - Special  Needs []  - Pediatric / Minor Patient Management 0 []  - 0 Isolation Patient Management []  - 0 Hearing / Language / Visual special needs []  - 0 Assessment of Community assistance (transportation, D/C planning, etc.) []  - 0 Additional assistance / Altered mentation []  - 0 Support  Surface(s) Assessment (bed, cushion, seat, etc.) INTERVENTIONS - Miscellaneous []  - External ear exam 0 []  - 0 Patient Transfer (multiple staff / Civil Service fast streamer / Similar devices) []  - 0 Simple Staple / Suture removal (25 or less) []  - 0 Complex Staple / Suture removal (26 or more) []  - 0 Hypo/Hyperglycemic Management (do not check if billed separately) Fulfer, Sandrea M. ([id number]) []  - 0 Ankle / Brachial Index (ABI) - do not check if billed separately Has the patient been seen at the hospital within the last three years: Yes Total Score: 85 Level Of Care: New/Established - Level 3 Electronic Signature(s) Signed: 06/02/2019 11:51:11 AM By: Army Melia Entered By: Army Melia on 06/02/2019 10:41:47 Hodgkiss, Takeia Jerilynn Mages ([id number]) -------------------------------------------------------------------------------- Compression Therapy Details Patient Name: [name] Date of Service: 06/02/2019 9:45 AM Medical Record Number: [id number] Patient Account Number: [id number] Date of Birth/Sex: 10/09/1941 (78 y.o. F) Treating RN: Army Melia Primary Care Lycan Davee: Salome Holmes Other Clinician: Referring Maziah Keeling: Referral, Self Treating Deshannon Hinchliffe/Extender: STONE III, HOYT Weeks in Treatment: 0 Compression Therapy Performed for Wound Assessment: NonWound Condition Lymphedema - Other: Bilateral legs Performed By: Clinician Army Melia, RN Compression Type: Three Layer Pre Treatment ABI: 0.8 Post Procedure Diagnosis Same as Pre-procedure Electronic Signature(s) Signed: 06/02/2019 11:51:11 AM By: Army Melia Entered By: Army Melia on 06/02/2019 10:39:53 Costello, [name]  ([id number]) -------------------------------------------------------------------------------- Lower Extremity Assessment Details Patient Name: [name] Date of Service: 06/02/2019 9:45 AM Medical Record Number: [id number] Patient Account Number: [id number] Date of Birth/Sex: 1940-11-16 (78 y.o. F) Treating RN: Montey Hora Primary Care Micheal Sheen: Salome Holmes Other Clinician: Referring Aamya Orellana: Referral, Self Treating Mansur Patti/Extender: STONE III, HOYT Weeks in Treatment: 0 Edema Assessment Assessed: [Left: No] [Right: No] Edema: [Left: Yes] [Right: Yes] Calf Left: Right: Point of Measurement: 34 cm From Medial Instep 42 cm 39.3 cm Ankle Left: Right: Point of Measurement: 12 cm From Medial Instep 32 cm 30.4 cm Vascular Assessment Pulses: Dorsalis Pedis Palpable: [Left:Yes] [Right:Yes] Posterior Tibial Palpable: [Left:No] [Right:No] Blood Pressure: Brachial: [Left:179] [Right:179] Ankle: [Left:Dorsalis Pedis: 150 0.84] [Right:Dorsalis Pedis: 146 0.82] Electronic Signature(s) Signed: 06/02/2019 4:23:33 PM By: Montey Hora Entered By: Montey Hora on 06/02/2019 10:28:23 [place], [name] ([id number]) -------------------------------------------------------------------------------- Multi Wound Chart Details Patient Name: [name] Date of Service: 06/02/2019 9:45 AM Medical Record Number: [id number] Patient Account Number: [id number] Date of Birth/Sex: 1941/09/19 (78 y.o. F) Treating RN: Army Melia Primary Care Carder Yin: Salome Holmes Other Clinician: Referring Oluwatobi Ruppe: Referral, Self Treating Tharun Cappella/Extender: STONE III, HOYT Weeks in Treatment: 0 Vital Signs Height(in): 65 Pulse(bpm): 75 Weight(lbs): 147 Blood Pressure(mmHg): 179/79 Body Mass Index(BMI): 24 Temperature(F): 98.1 Respiratory Rate(breaths/min): 18 Photos: [N/A:N/A] Wound Location: Left Lower Leg - Lateral N/A N/A Wounding Event: Gradually Appeared N/A N/A Primary Etiology:  Lymphedema N/A N/A Comorbid History: Chronic sinus problems/congestion, N/A N/A Anemia, Lymphedema, Angina, Arrhythmia, Hypertension, Gout, Rheumatoid Arthritis, Osteoarthritis Date Acquired: 05/09/2019 N/A N/A Weeks of Treatment: 0 N/A N/A Wound Status: Open N/A N/A Measurements L x W x D (cm) 0.6x0.5x0.1 N/A N/A Area (cm) : 0.236 N/A N/A Volume (cm) : 0.024 N/A N/A Classification: Full Thickness Without Exposed N/A N/A Support Structures Exudate Amount: Medium N/A N/A Exudate Type: Serous N/A N/A Exudate Color: amber N/A N/A Wound Margin: Indistinct, nonvisible N/A N/A Granulation Amount: Large (67-100%) N/A N/A Granulation Quality: Pale N/A N/A Necrotic Amount: Small (1-33%) N/A N/A Exposed Structures: Fat Layer (Subcutaneous Tissue) N/A N/A Exposed: Yes Fascia: No Tendon: No Muscle: No Joint: No Bone: No Epithelialization: Medium (34-66%) N/A N/A  Treatment Notes Electronic Signature(s) Signed: 06/02/2019 11:51:11 AM By: Army Melia Entered By: Army Melia on 06/02/2019 10:36:31 Steib, EVONDA ENGE ([id number]) Wickizer, ZANAI MALLARI ([id number]) -------------------------------------------------------------------------------- Rea Details Patient Name: [name] Date of Service: 06/02/2019 9:45 AM Medical Record Number: [id number] Patient Account Number: [id number] Date of Birth/Sex: [birth year] (78 y.o. F) Treating RN: Army Melia Primary Care Ramonte Mena: Salome Holmes Other Clinician: Referring Dnaiel Voller: Referral, Self Treating Izzabella Besse/Extender: Melburn Hake, HOYT Weeks in Treatment: 0 Active Inactive Abuse / Safety / Falls / Self Care Management Nursing Diagnoses: Potential for falls Goals: Patient/caregiver will verbalize understanding of skin care regimen Date Initiated: 06/02/2019 Target Resolution Date: 06/16/2019 Goal Status: Active Patient/caregiver will verbalize/demonstrate measures taken to prevent injury and/or falls Date Initiated:  06/02/2019 Target Resolution Date: 06/16/2019 Goal Status: Active Interventions: Assess fall risk on admission and as needed Provide education on safe transfers Notes: Orientation to the Wound Care Program Nursing Diagnoses: Knowledge deficit related to the wound healing center program Goals: Patient/caregiver will verbalize understanding of the [place] Program Date Initiated: 06/02/2019 Target Resolution Date: 06/16/2019 Goal Status: Active Interventions: Provide education on orientation to the wound center Notes: Wound/Skin Impairment Nursing Diagnoses: Impaired tissue integrity Goals: Ulcer/skin breakdown will have a volume reduction of 30% by week 4 Date Initiated: 06/02/2019 Target Resolution Date: 06/23/2019 Goal Status: Active Interventions: Assess ulceration(s) every visit Provide education on ulcer and skin care Notes: DEADRA, DIGGINS ([id number]) Electronic Signature(s) Signed: 06/02/2019 11:51:11 AM By: Army Melia Entered By: Army Melia on 06/02/2019 10:36:07 [place]aka, Lilith Jerilynn Mages ([id number]) -------------------------------------------------------------------------------- Pain Assessment Details Patient Name: [name] Date of Service: 06/02/2019 9:45 AM Medical Record Number: [id number] Patient Account Number: [id number] Date of Birth/Sex: 1941/02/15 (78 y.o. F) Treating RN: Montey Hora Primary Care Zakya Halabi: Salome Holmes Other Clinician: Referring Hameed Kolar: Referral, Self Treating Wynonia Medero/Extender: STONE III, HOYT Weeks in Treatment: 0 Active Problems Location of Pain Severity and Description of Pain Patient Has Paino Yes Site Locations Pain Location: Pain in Ulcers With Dressing Change: Yes Duration of the Pain. Constant / Intermittento Intermittent Pain Management and Medication Current Pain Management: Electronic Signature(s) Signed: 06/02/2019 4:23:33 PM By: Montey Hora Entered By: Montey Hora on 06/02/2019 10:04:55 [place],  [name] ([id number]) -------------------------------------------------------------------------------- Patient/Caregiver Education Details Patient Name: [name] Date of Service: 06/02/2019 9:45 AM Medical Record Number: [id number] Patient Account Number: [id number] Date of Birth/Gender: 1941/05/12 (78 y.o. F) Treating RN: Army Melia Primary Care Physician: Salome Holmes Other Clinician: Referring Physician: Referral, Self Treating Physician/Extender: Sharalyn Ink in Treatment: 0 Education Assessment Education Provided To: Patient Education Topics Provided Safety: Methods: Demonstration, Explain/Verbal Responses: State content correctly Wound/Skin Impairment: Handouts: Caring for Your Ulcer Methods: Demonstration, Explain/Verbal Responses: State content correctly Electronic Signature(s) Signed: 06/02/2019 11:51:11 AM By: Army Melia Entered By: Army Melia on 06/02/2019 10:42:23 Dacruz, Diera Jerilynn Mages ([id number]) -------------------------------------------------------------------------------- Wound Assessment Details Patient Name: [name] Date of Service: 06/02/2019 9:45 AM Medical Record Number: [id number] Patient Account Number: [id number] Date of Birth/Sex: 1941/09/27 (78 y.o. F) Treating RN: Montey Hora Primary Care Shazia Mitchener: Salome Holmes Other Clinician: Referring Rogenia Werntz: Referral, Self Treating Aldena Worm/Extender: STONE III, HOYT Weeks in Treatment: 0 Wound Status Wound Number: 3 Primary Lymphedema Etiology: Wound Location: Left Lower Leg - Lateral Wound Open Wounding Event: Gradually Appeared Status: Date Acquired: 05/09/2019 Comorbid Chronic sinus problems/congestion, Anemia, Lymphedema, Weeks Of Treatment: 0 History: Angina, Arrhythmia, Hypertension, Gout, Rheumatoid Clustered Wound: No Arthritis, Osteoarthritis Photos Wound Measurements Length: (cm) 0.6 Width: (cm) 0.5 Depth: (cm) 0.1 Area: (  cm) 0.236 Volume: (cm) 0.024 %  Reduction in Area: % Reduction in Volume: Epithelialization: Medium (34-66%) Tunneling: No Undermining: No Wound Description Classification: Full Thickness Without Exposed Support Structu Wound Margin: Indistinct, nonvisible Exudate Amount: Medium Exudate Type: Serous Exudate Color: amber res Foul Odor After Cleansing: No Slough/Fibrino Yes Wound Bed Granulation Amount: Large (67-100%) Exposed Structure Granulation Quality: Pale Fascia Exposed: No Necrotic Amount: Small (1-33%) Fat Layer (Subcutaneous Tissue) Exposed: Yes Necrotic Quality: Adherent Slough Tendon Exposed: No Muscle Exposed: No Joint Exposed: No Bone Exposed: No Electronic Signature(s) Signed: 06/02/2019 4:23:33 PM By: Montey Hora Entered By: Montey Hora on 06/02/2019 10:22:20 Zamor, [name] ([id number]) -------------------------------------------------------------------------------- Vitals Details Patient Name: [name] Date of Service: 06/02/2019 9:45 AM Medical Record Number: [id number] Patient Account Number: [id number] Date of Birth/Sex: [birth year] (78 y.o. F) Treating RN: Montey Hora Primary Care Tonji Elliff: Salome Holmes Other Clinician: Referring Kairi Harshbarger: Referral, Self Treating Aldyn Toon/Extender: STONE III, HOYT Weeks in Treatment: 0 Vital Signs Time Taken: 10:04 Temperature (F): 98.1 Height (in): 65 Pulse (bpm): 63 Source: Measured Respiratory Rate (breaths/min): 18 Weight (lbs): 147 Blood Pressure (mmHg): 179/79 Source: Measured Reference Range: 80 - 120 mg / dl Body Mass Index (BMI): 24.5 Electronic Signature(s) Signed: 06/02/2019 4:23:33 PM By: Montey Hora Entered By: Montey Hora on 06/02/2019 10:06:16

## 2019-06-02 NOTE — Progress Notes (Signed)
Kristina, [name] ([id number]) Visit Report for 06/02/2019 Chief Complaint Document Details Patient Name: Kristina, [name] Date of Service: 06/02/2019 9:45 AM Medical Record Number: [id number] Patient Account Number: [id number] Date of Birth/Sex: 26-Mar-1941 (78 y.o. F) Treating RN: [name] Primary Care Provider: Salome [name] Other Clinician: Referring Provider: Nolon [name] Treating Provider/Extender: [name], [name] Weeks in Treatment: 0 Information Obtained from: Patient Chief Complaint Bilateral LE Blisters/Ulcers Electronic Signature(s) Signed: 06/02/2019 10:32:20 AM By: Kristina Keeler PA-C Entered By: [name] on 06/02/2019 10:32:20 [name] ([id number]) -------------------------------------------------------------------------------- HPI Details Patient Name: [name] Date of Service: 06/02/2019 9:45 AM Medical Record Number: [id number] Patient Account Number: [id number] Date of Birth/Sex: 15-Feb-1941 (78 y.o. F) Treating RN: [name] Primary Care Provider: Salome [name] Other Clinician: Referring Provider: Nolon [name] Treating Provider/Extender: [name], [name] Weeks in Treatment: 0 History of Present Illness HPI Description: 03/05/18 on evaluation today patient presents for initial evaluation and our clinic due to significantly large blisters of the bilateral lower extremities which began initially on 02/18/18. 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 Secondarily it does appear that they felt she likely had cellulitis. Subsequently the patient was seen for fault evaluation on 03/03/18 and that is really when the cellulitis was diagnosed. She was actually placed on doxycycline 100 mg by mouth twice a day for 10 days. With that being said she was prescribed prednisone although she did not tolerate this. Apparently she had some alteration in her  mental status with taking this. 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 She has never smoked and unfortunately does have pain at the site where these blisters are located of the bilateral lower extremities. She also does appear to have significant lymphedema which obviously has been more chronic. 03/12/18 on evaluation today patient actually appears to be doing much better in regard to her bilateral [employer] ulcers. Unfortunately a lot of the alginate dressing did stick to her legs due to the fact that everything dried up rather rapidly in the compression seems to have done very well in that regard. Unfortunately the home health nurse last time she came out to the patient's home was not able to fully remove the alginate from her leg so she just rewrapped it with what was there continuing to be in place. Obviously that does need to come off but overall I'm extremely pleased with the progress it's been made over the past week which is the Kerlex and Coban wraps. 03/18/18-She is here in follow-up evaluation for bilateral lower extremity lymphedema with blister/ulceration. She is essentially healed to the right lower extremity with one residual pinpoint opening, the left lower extremity has multiple pinpoint openings with minimal amount of serous drainage. We will continue with compression over this next week. We have requested that home help initiate the process of ordering compression wraps for long-term management. She will follow-up next week, I anticipate she will be healed at that appointment 03/26/18 on evaluation today patient appears to be doing extremely well in regard to her bilateral lower extremity ulcers. This has completely resolved for the most part on the left lower extremity and has completely resolved in regard to the right lower extremity. She's been tolerating the dressing changes without complication including the  wraps. Overall her swelling is down significantly and I think this accounts for  why she is doing so much better at this time. Overall she is very pleased and having very little discomfort compared to previous. 04/02/18 on evaluation today patient actually appears to be completely healed which is excellent news. She has been tolerating the wraps without complication obviously this has done very well for her. In general she shows no signs of infection or otherwise complicating factors. She's having no pain. Readmission: 06/02/2019 on evaluation today patient appears to be unfortunately experiencing issues with increased swelling of her bilateral lower extremities. I have last seen her in June 2019. At that time everything healed and it does appear that we actually got her some Velcro compression at that time. Nonetheless she states she was wearing that until about 5 weeks ago and then subsequently she quit wearing it she tells me the only excuse that she can give is "laziness". Nonetheless she is not able to get any compression sleeves/stockings on. she tells me currently that she is not experiencing any evidence of infection at this time she is been having some leaking intermittently from her lower extremities right now the left on the lateral portion is the only area open. No fevers, chills, nausea, vomiting, or diarrhea. Electronic Signature(s) Signed: 06/02/2019 10:52:19 AM By: [name] Si, Kristina M. ([id number]) Entered By: [name] on 06/02/2019 10:52:19 [name] ([id number]) -------------------------------------------------------------------------------- Physical Exam Details Patient Name: [name] Date of Service: 06/02/2019 9:45 AM Medical Record Number: [id number] Patient Account Number: [id number] Date of Birth/Sex: 1941-06-24 (78 y.o. F) Treating RN: [name] Primary Care Provider: Salome [name] Other Clinician: Referring Provider: Nolon [name] Treating Provider/Extender: [name], [name] Weeks in Treatment: 0 Constitutional patient is hypertensive.. pulse regular and within target range for patient.Marland Kitchen respirations regular, non-labored and within target range for patient.Marland Kitchen temperature within target range for patient.. Well-nourished and well-hydrated in no acute distress. Eyes conjunctiva clear no eyelid edema noted. pupils equal round and reactive to light and accommodation. Ears, Nose, Mouth, and Throat no gross abnormality of ear auricles or external auditory canals. normal hearing noted during conversation. mucus membranes moist. Respiratory normal breathing without difficulty. clear to auscultation bilaterally. Cardiovascular regular rate and rhythm with normal S1, S2. 2+ pitting edema of the bilateral lower extremities. Gastrointestinal (GI) soft, non-tender, non-distended, +BS. no ventral hernia noted. Musculoskeletal normal gait and posture. no significant deformity or arthritic changes, no loss or range of motion, no clubbing. Psychiatric this patient is able to make decisions and demonstrates good insight into disease process. Alert and Oriented x 3. pleasant and cooperative. Notes Upon inspection patient has a small area of skin breakdown secondary to lymphedema on the left lateral lower leg. Fortunately there is no signs of any additional wounds even this appears to be fairly benign and again I think will heal quite nicely. With that being said she does have bilateral lower extremity lymphedema and she actually has pitting edema at this time 2+. Fortunately her ABIs were okay at 0.84 and 0.82 left versus right respectively. Overall I am actually rather pleased with that as well as with the appearance of her legs in general except for the fact we need to get her swelling under better control. Electronic Signature(s) Signed: 06/02/2019 10:53:29 AM By: Kristina Keeler PA-C Entered By: [name] on  06/02/2019 10:53:29 Streck, [name] ([id number]) -------------------------------------------------------------------------------- Physician Orders Details Patient Name: [name] Date of Service: 06/02/2019 9:45 AM Medical Record Number: [id number] Patient Account Number: [id number] Date of Birth/Sex:  November 22, 1940 (78 y.o. 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 Dressing Change Frequency Wound #3 Left,Lateral Lower Leg o Dressing is to be changed Monday and Thursday. - return for nurse visit monday Follow-up Appointments Wound #3 Left,Lateral Lower Leg o Return Appointment in 1 week. Edema Control o 3 Layer Compression System - Bilateral Electronic Signature(s) Signed: 06/02/2019 11:51:11 AM By: [name] Signed: 06/02/2019 5:39:21 PM By: Kristina Keeler PA-C Entered By: [name] on 06/02/2019 11:46:29 [place], [name] ([id number]) -------------------------------------------------------------------------------- Problem List Details Patient Name: [name] Date of Service: 06/02/2019 9:45 AM Medical Record Number: [id number] Patient Account Number: [id number] Date of Birth/Sex: [birth year] (78 y.o. 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 F) Treating RN: [name] Primary Care Provider: Salome [name] Other Clinician: Referring Provider: Nolon [name] Treating Provider/Extender: [name], Janira Mandell Weeks in Treatment: 0 Subjective Chief Complaint Information obtained from Patient Bilateral LE Blisters/Ulcers History of Present Illness (HPI) 03/05/18 on evaluation today patient presents for initial evaluation and our clinic due to significantly large blisters of the bilateral lower extremities which began initially on 02/18/18. 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 Secondarily it does appear that they felt she likely had cellulitis. Subsequently the patient was seen for fault evaluation on 03/03/18 and that is really when the cellulitis was diagnosed. She was actually placed on doxycycline 100 mg by mouth twice a day for 10 days. With that being said she was prescribed prednisone although she did not tolerate this. Apparently she had some alteration in her mental status with taking this. 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 She has never smoked and  unfortunately does have pain at the site where these blisters are located of the bilateral lower extremities. She also does appear to have significant lymphedema which obviously has been more chronic. 03/12/18 on evaluation today patient actually appears to be doing much better in regard to her bilateral [employer] ulcers. Unfortunately a lot of the alginate dressing did stick to her legs due to the fact that everything dried up rather rapidly in the compression seems to have done very well in that regard. Unfortunately the home health nurse last time she came out to the patient's home was not able to fully  remove the alginate from her leg so she just rewrapped it with what was there continuing to be in place. Obviously that does need to come off but overall I'm extremely pleased with the progress it's been made over the past week which is the Kerlex and Coban wraps. 03/18/18-She is here in follow-up evaluation for bilateral lower extremity lymphedema with blister/ulceration. She is essentially healed to the right lower extremity with one residual pinpoint opening, the left lower extremity has multiple pinpoint openings with minimal amount of serous drainage. We will continue with compression over this next week. We have requested that home help initiate the process of ordering compression wraps for long-term management. She will follow-up next week, I anticipate she will be healed at that appointment 03/26/18 on evaluation today patient appears to be doing extremely well in regard to her bilateral lower extremity ulcers. This has completely resolved for the most part on the left lower extremity and has completely resolved in regard to the right lower extremity. She's been tolerating the dressing changes without complication including the wraps. Overall her swelling is down significantly and I think this accounts for why she is doing so much better at this time. Overall she is very pleased and  having very little discomfort compared to previous. 04/02/18 on evaluation today patient actually appears to be completely healed which is excellent news. She has been tolerating the wraps without complication obviously this has done very well for her. In general she shows no signs of infection or otherwise complicating factors. She's having no pain. Readmission: 06/02/2019 on evaluation today patient appears to be unfortunately experiencing issues with increased swelling of her bilateral lower extremities. I have last seen her in June 2019. At that time everything healed and it does appear that we actually got her some Velcro compression at that time. Nonetheless she states she was wearing that until about 5 weeks ago and then subsequently she quit wearing it she tells me the only excuse that she can give is "laziness". Nonetheless she is not able to get any compression sleeves/stockings on. she tells me currently that she is not experiencing any evidence of infection at this time she is been having some leaking intermittently from her lower extremities right now the left on the lateral portion is the [place], [name] ([id number]) only area open. No fevers, chills, nausea, vomiting, or diarrhea. Patient History Information obtained from Patient. Allergies No Known Drug Allergies Family History Cancer - Mother, Diabetes - Siblings, Hypertension - Siblings, Lung Disease - Siblings, No family history of Heart Disease, Hereditary Spherocytosis, Kidney Disease, Seizures, Stroke, Thyroid Problems, Tuberculosis. Social History Never smoker, Marital Status - Widowed, Alcohol Use - Never, Drug Use - No History, Caffeine Use - Never. Medical History Eyes Denies history of Cataracts, Glaucoma, Optic Neuritis Ear/Nose/Mouth/Throat Patient has history of Chronic sinus problems/congestion - allergys seasonal Denies history of Middle ear problems Hematologic/Lymphatic Patient has history of Anemia,  Lymphedema Denies history of Hemophilia, Human Immunodeficiency Virus, Sickle Cell Disease Respiratory Denies history of Aspiration, Asthma, Chronic Obstructive Pulmonary Disease (COPD), Pneumothorax, Sleep Apnea, Tuberculosis Cardiovascular Patient has history of Angina, Arrhythmia - murmur, Hypertension Denies history of Congestive Heart Failure, Coronary Artery Disease, Deep Vein Thrombosis, Hypotension, Myocardial Infarction, Peripheral Arterial Disease, Peripheral Venous Disease, Phlebitis, Vasculitis Gastrointestinal Denies history of Cirrhosis , Colitis, Crohn s, Hepatitis A, Hepatitis B, Hepatitis C Endocrine Denies history of Type I Diabetes, Type II Diabetes Genitourinary Denies history of End Stage Renal Disease Immunological Denies history of Lupus  Erythematosus, Raynaud s, Scleroderma Integumentary (Skin) Denies history of History of Burn, History of pressure wounds Musculoskeletal Patient has history of Gout, Rheumatoid Arthritis, Osteoarthritis Denies history of Osteomyelitis Neurologic Denies history of Dementia, Neuropathy, Quadriplegia, Paraplegia, Seizure Disorder Oncologic Denies history of Received Chemotherapy, Received Radiation Psychiatric Denies history of Anorexia/bulimia, Confinement Anxiety Review of Systems (ROS) Eyes Denies complaints or symptoms of Dry Eyes, Vision Changes, Glasses / Contacts. Ear/Nose/Mouth/Throat Denies complaints or symptoms of Difficult clearing ears, Sinusitis. Hematologic/Lymphatic [name], [name] ([id number]) Denies complaints or symptoms of Bleeding / Clotting Disorders. Respiratory Denies complaints or symptoms of Chronic or frequent coughs, Shortness of Breath. Cardiovascular Complains or has symptoms of LE edema. Denies complaints or symptoms of Chest pain. Gastrointestinal Denies complaints or symptoms of Frequent diarrhea, Nausea, Vomiting. Endocrine Denies complaints or symptoms of Hepatitis, Thyroid disease,  Polydypsia (Excessive Thirst). Genitourinary Denies complaints or symptoms of Kidney failure/ Dialysis, Incontinence/dribbling. Immunological Denies complaints or symptoms of Hives, Itching. Integumentary (Skin) Complains or has symptoms of Wounds, Swelling. Denies complaints or symptoms of Bleeding or bruising tendency, Breakdown. Musculoskeletal Denies complaints or symptoms of Muscle Pain, Muscle Weakness. Neurologic Denies complaints or symptoms of Numbness/parasthesias, Focal/Weakness. Psychiatric Denies complaints or symptoms of Anxiety, Claustrophobia. Objective Constitutional patient is hypertensive.. pulse regular and within target range for patient.Marland Kitchen respirations regular, non-labored and within target range for patient.Marland Kitchen temperature within target range for patient.. Well-nourished and well-hydrated in no acute distress. Vitals Time Taken: 10:04 AM, Height: 65 in, Source: Measured, Weight: 147 lbs, Source: Measured, BMI: 24.5, Temperature: 98.1 F, Pulse: 63 bpm, Respiratory Rate: 18 breaths/min, Blood Pressure: 179/79 mmHg. Eyes conjunctiva clear no eyelid edema noted. pupils equal round and reactive to light and accommodation. Ears, Nose, Mouth, and Throat no gross abnormality of ear auricles or external auditory canals. normal hearing noted during conversation. mucus membranes moist. Respiratory normal breathing without difficulty. clear to auscultation bilaterally. Cardiovascular regular rate and rhythm with normal S1, S2. 2+ pitting edema of the bilateral lower extremities. Gastrointestinal (GI) soft, non-tender, non-distended, +BS. no ventral hernia noted. Musculoskeletal [name], Kristina M. ([id number]) normal gait and posture. no significant deformity or arthritic changes, no loss or range of motion, no clubbing. Psychiatric this patient is able to make decisions and demonstrates good insight into disease process. Alert and Oriented x 3. pleasant and  cooperative. General Notes: Upon inspection patient has a small area of skin breakdown secondary to lymphedema on the left lateral lower leg. Fortunately there is no signs of any additional wounds even this appears to be fairly benign and again I think will heal quite nicely. With that being said she does have bilateral lower extremity lymphedema and she actually has pitting edema at this time 2+. Fortunately her ABIs were okay at 0.84 and 0.82 left versus right respectively. Overall I am actually rather pleased with that as well as with the appearance of her legs in general except for the fact we need to get her swelling under better control. Integumentary (Hair, Skin) Wound #3 status is Open. Original cause of wound was Gradually Appeared. The wound is located on the Left,Lateral Lower Leg. The wound measures 0.6cm length x 0.5cm width x 0.1cm depth; 0.236cm^2 area and 0.024cm^3 volume. There is Fat Layer (Subcutaneous Tissue) Exposed exposed. There is no tunneling or undermining noted. There is a medium amount of serous drainage noted. The wound margin is indistinct and nonvisible. There is large (67-100%) pale granulation within the wound bed. There is a small (1-33%) amount of necrotic tissue within the  wound bed including Adherent Slough. 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 Post procedure Diagnosis Wound #: Same as Pre-Procedure Plan Wound Cleansing: Wound #3 Left,Lateral Lower Leg: Clean wound with Normal Saline. Edema Control: 3 Layer Compression System - Bilateral [place], Raynesha M. ([id number]) 1. I would recommend that we go ahead and initiate a 3 layer compression wrap bilaterally. We will put 1 of the  protective dressings to come in the Profore wrap over top of the open area to prevent this from sticking. 2. I am in a suggest that she elevate her legs as much as possible I think this can help with the swelling as well. 3. I would also suggest that we have her bring her Velcro compression stockings that she has with her at the next visit. We will subsequently see which when she has and how they function for her she states until 5 weeks ago they seem to be doing well and she was wearing them pretty regularly. That is likely why her legs have reopened at this point. We will see patient back for reevaluation in 1 week here in the clinic. If anything worsens or changes patient will contact our office for additional recommendations. Electronic Signature(s) Signed: 06/02/2019 10:54:25 AM By: Kristina Keeler PA-C Entered By: [name] on 06/02/2019 10:54:24 [name], [name] ([id number]) -------------------------------------------------------------------------------- ROS/PFSH Details Patient Name: [name] Date of Service: 06/02/2019 9:45 AM Medical Record Number: [id number] Patient Account Number: [id number] Date of Birth/Sex: March 31, 1941 (78 y.o. F) Treating RN: Montey Hora Primary Care Provider: Salome [name] Other Clinician: Referring Provider: Nolon [name] Treating Provider/Extender: [name], Imanol Bihl Weeks in Treatment: 0 Information Obtained From Patient Eyes Complaints and Symptoms: Negative for: Dry Eyes; Vision Changes; Glasses / Contacts Medical History: Negative for: Cataracts; Glaucoma; Optic Neuritis Ear/Nose/Mouth/Throat Complaints and Symptoms: Negative for: Difficult clearing ears; Sinusitis Medical History: Positive for: Chronic sinus problems/congestion - allergys seasonal Negative for: Middle ear problems Hematologic/Lymphatic Complaints and Symptoms: Negative for: Bleeding / Clotting Disorders Medical History: Positive for: Anemia;  Lymphedema Negative for: Hemophilia; Human Immunodeficiency Virus; Sickle Cell Disease Respiratory Complaints and Symptoms: Negative for: Chronic or frequent coughs; Shortness of Breath Medical History: Negative for: Aspiration; Asthma; Chronic Obstructive Pulmonary Disease (COPD); Pneumothorax; Sleep Apnea; Tuberculosis Cardiovascular Complaints and Symptoms: Positive for: LE edema Negative for: Chest pain Medical History: Positive for: Angina; Arrhythmia - murmur; Hypertension Negative for: Congestive Heart Failure; Coronary Artery Disease; Deep Vein Thrombosis; Hypotension; Myocardial Infarction; Peripheral Arterial Disease; Peripheral Venous Disease; Phlebitis; Vasculitis Gastrointestinal [name], Kristina M. ([id number]) Complaints and Symptoms: Negative for: Frequent diarrhea; Nausea; Vomiting Medical History: Negative for: Cirrhosis ; Colitis; Crohnos; Hepatitis A; Hepatitis B; Hepatitis C Endocrine Complaints and Symptoms: Negative for: Hepatitis; Thyroid disease; Polydypsia (Excessive Thirst) Medical History: Negative for: Type I Diabetes; Type II Diabetes Genitourinary Complaints and Symptoms: Negative for: Kidney failure/ Dialysis; Incontinence/dribbling Medical History: Negative for: End Stage Renal Disease Immunological Complaints and Symptoms: Negative for: Hives; Itching Medical History: Negative for: Lupus Erythematosus; Raynaudos; Scleroderma Integumentary (Skin) Complaints and Symptoms: Positive for: Wounds; Swelling Negative for: Bleeding or bruising tendency; Breakdown Medical History: Negative for: History of Burn; History of pressure wounds Musculoskeletal Complaints and Symptoms: Negative for: Muscle Pain; Muscle Weakness Medical History: Positive for: Gout; Rheumatoid Arthritis; Osteoarthritis Negative for: Osteomyelitis  Neurologic Complaints and Symptoms: Negative for: Numbness/parasthesias; Focal/Weakness Medical History: Negative for:  Dementia; Neuropathy; Quadriplegia; Paraplegia; Seizure Disorder Psychiatric Complaints and Symptoms: Negative for: Anxiety; Claustrophobia [name], Kristina M. ([id number]) Medical History: Negative for: Anorexia/bulimia; Confinement Anxiety Oncologic Medical History: Negative for: Received Chemotherapy; Received Radiation HBO Extended History Items Ear/Nose/Mouth/Throat: Chronic sinus problems/congestion Immunizations Pneumococcal Vaccine: Received Pneumococcal Vaccination: No Implantable Devices None Family and Social History Cancer: Yes - Mother; Diabetes: Yes - Siblings; Heart Disease: No; Hereditary Spherocytosis: No; Hypertension: Yes - Siblings; Kidney Disease: No; Lung Disease: Yes - Siblings; Seizures: No; Stroke: No; Thyroid Problems: No; Tuberculosis: No; Never smoker; Marital Status - Widowed; Alcohol Use: Never; Drug Use: No History; Caffeine Use: Never; Financial Concerns: No; Food, Clothing or Shelter Needs: No; Support System Lacking: No; Transportation Concerns: No Electronic Signature(s) Signed: 06/02/2019 4:23:33 PM By: Montey Hora Signed: 06/02/2019 5:39:21 PM By: Kristina Keeler PA-C Entered By: Montey Hora on 06/02/2019 10:07:28 [name], [name] ([id number]) -------------------------------------------------------------------------------- SuperBill Details Patient Name: [name] Date of Service: 06/02/2019 Medical Record Number: [id number] Patient Account Number: [id number] Date of Birth/Sex: [birth year] (78 y.o. 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

## 2019-06-06 ENCOUNTER — Other Ambulatory Visit: Payer: Self-pay

## 2019-06-06 DIAGNOSIS — L97822 Non-pressure chronic ulcer of other part of left lower leg with fat layer exposed: Secondary | ICD-10-CM | POA: Diagnosis not present

## 2019-06-06 NOTE — Progress Notes (Signed)
[name] ([id number]) Visit Report for 06/06/2019 Arrival Information Details Patient Name: [name], [name] Date of Service: 06/06/2019 1:30 PM Medical Record Number: [id number] Patient Account Number: [id number] Date of Birth/Sex: January 06, 1941 (78 y.o. F) Treating RN: Montey Hora Primary Care Teresia Myint: Salome Holmes Other Clinician: Referring Magali Bray: Salome Holmes Treating Dalessandro Baldyga/Extender: Melburn Hake, HOYT Weeks in Treatment: 0 Visit Information History Since Last Visit Added or deleted any medications: No Patient Arrived: Cane Any new allergies or adverse reactions: No Arrival Time: 13:38 Had a fall or experienced change in No Accompanied By: self activities of daily living that may affect Transfer Assistance: None risk of falls: Patient Identification Verified: Yes Signs or symptoms of abuse/neglect since last visito No Secondary Verification Process Completed: Yes Hospitalized since last visit: No Implantable device outside of the clinic excluding No cellular tissue based products placed in the center since last visit: Has Dressing in Place as Prescribed: Yes Has Compression in Place as Prescribed: Yes Pain Present Now: No Electronic Signature(s) Signed: 06/06/2019 3:54:17 PM By: Montey Hora Entered By: Montey Hora on 06/06/2019 13:38:58 [name] ([id number]) -------------------------------------------------------------------------------- Compression Therapy Details Patient Name: [name] Date of Service: 06/06/2019 1:30 PM Medical Record Number: [id number] Patient Account Number: [id number] Date of Birth/Sex: Jan 22, 1941 (78 y.o. F) Treating RN: Montey Hora Primary Care Lott Seelbach: Salome Holmes Other Clinician: Referring Tanara Turvey: Salome Holmes Treating Caron Tardif/Extender: Melburn Hake, HOYT Weeks in Treatment: 0 Compression Therapy Performed for Wound Assessment: Wound #3 Left,Lateral Lower Leg Performed By: Clinician Montey Hora,  RN Compression Type: Three Layer Pre Treatment ABI: 0.8 Electronic Signature(s) Signed: 06/06/2019 3:54:17 PM By: Montey Hora Entered By: Montey Hora on 06/06/2019 13:40:24 [name] ([id number]) -------------------------------------------------------------------------------- Encounter Discharge Information Details Patient Name: [name] Date of Service: 06/06/2019 1:30 PM Medical Record Number: [id number] Patient Account Number: [id number] Date of Birth/Sex: 10-Jan-1941 (78 y.o. F) Treating RN: Montey Hora Primary Care Velera Lansdale: Salome Holmes Other Clinician: Referring Terricka Onofrio: Salome Holmes Treating Yvonna Brun/Extender: Melburn Hake, HOYT Weeks in Treatment: 0 Encounter Discharge Information Items Discharge Condition: Stable Ambulatory Status: Cane Discharge Destination: Home Transportation: Private Auto Accompanied By: self Schedule Follow-up Appointment: No Clinical Summary of Care: Electronic Signature(s) Signed: 06/06/2019 3:54:17 PM By: Montey Hora Entered By: Montey Hora on 06/06/2019 13:41:11 [name] ([id number]) -------------------------------------------------------------------------------- Wound Assessment Details Patient Name: [name] Date of Service: 06/06/2019 1:30 PM Medical Record Number: [id number] Patient Account Number: [id number] Date of Birth/Sex: 19-Mar-1941 (78 y.o. F) Treating RN: Montey Hora Primary Care Sagan Maselli: Salome Holmes Other Clinician: Referring Rodriguez Aguinaldo: Salome Holmes Treating Signe Tackitt/Extender: Melburn Hake, HOYT Weeks in Treatment: 0 Wound Status Wound Number: 3 Primary Lymphedema Etiology: Wound Location: Left Lower Leg - Lateral Wound Open Wounding Event: Gradually Appeared Status: Date Acquired: 05/09/2019 Comorbid Chronic sinus problems/congestion, Anemia, Weeks Of Treatment: 0 History: Lymphedema, Angina, Arrhythmia, Clustered Wound: No Hypertension, Gout, Rheumatoid  Arthritis, Osteoarthritis Photos Wound Measurements Length: (cm) 0.6 Width: (cm) 0.5 Depth: (cm) 0.1 Area: (cm) 0.236 Volume: (cm) 0.024 % Reduction in Area: 0% % Reduction in Volume: 0% Epithelialization: Medium (34-66%) Tunneling: No Undermining: No Wound Description Full Thickness Without Exposed Support Classification: Structures Wound Margin: Indistinct, nonvisible Exudate Medium Amount: Exudate Type: Serous Exudate Color: amber Foul Odor After Cleansing: No Slough/Fibrino Yes Wound Bed Granulation Amount: Large (67-100%) Exposed Structure Granulation Quality: Pale Fascia Exposed: No Necrotic Amount: Small (1-33%) Fat Layer (Subcutaneous Tissue) Exposed: Yes Necrotic Quality: Adherent Slough Tendon Exposed: No Muscle Exposed: No Joint Exposed: No Bone Exposed: No [name], [name] (  [id number]) Treatment Notes Wound #3 (Left, Lateral Lower Leg) Notes 3 layer wrap bilateral Electronic Signature(s) Signed: 06/06/2019 3:54:17 PM By: Montey Hora Entered By: Montey Hora on 06/06/2019 13:41:39

## 2019-06-09 ENCOUNTER — Other Ambulatory Visit: Payer: Self-pay

## 2019-06-09 ENCOUNTER — Encounter: Payer: Medicare (Managed Care) | Admitting: Physician Assistant

## 2019-06-09 DIAGNOSIS — L97822 Non-pressure chronic ulcer of other part of left lower leg with fat layer exposed: Secondary | ICD-10-CM | POA: Diagnosis not present

## 2019-06-09 NOTE — Progress Notes (Addendum)
RITHANYA, PEAKE ([id number]) Visit Report for 06/09/2019 Chief Complaint Document Details Patient Name: [name], [name] Date of Service: 06/09/2019 10:45 AM Medical Record Number: [id number] Patient Account Number: [id number] Date of Birth/Sex: 1940-11-02 (78 y.o. F) Treating RN: [name] Primary Care Provider: Salome Holmes Other Clinician: Referring Provider: Salome Holmes Treating Provider/Extender: [name], [name] Weeks in Treatment: 1 Information Obtained from: Patient Chief Complaint Bilateral LE Blisters/Ulcers Electronic Signature(s) Signed: 06/09/2019 10:56:41 AM By: Kristina Keeler PA-C Entered By: [name] on 06/09/2019 10:56:40 [name], [name] ([id number]) -------------------------------------------------------------------------------- HPI Details Patient Name: [name] Date of Service: 06/09/2019 10:45 AM Medical Record Number: [id number] Patient Account Number: [id number] Date of Birth/Sex: May 10, 1941 (78 y.o. F) Treating RN: [name] Primary Care Provider: Salome Holmes Other Clinician: Referring Provider: Salome Holmes Treating Provider/Extender: [name], [name] Weeks in Treatment: 1 History of Present Illness HPI Description: 03/05/18 on evaluation today patient presents for initial evaluation and our clinic due to significantly large blisters of the bilateral lower extremities which began initially on 02/18/18. 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 Secondarily it does appear that they felt she likely had cellulitis. Subsequently the patient was seen for fault evaluation on 03/03/18 and that is really when the cellulitis was diagnosed. She was actually placed on doxycycline 100 mg by mouth twice a day for 10 days. With that being said she was prescribed prednisone although she did not tolerate this. Apparently she had some alteration in her  mental status with taking this. 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 She has never smoked and unfortunately does have pain at the site where these blisters are located of the bilateral lower extremities. She also does appear to have significant lymphedema which obviously has been more chronic. 03/12/18 on evaluation today patient actually appears to be doing much better in regard to her bilateral [employer] ulcers. Unfortunately a lot of the alginate dressing did stick to her legs due to the fact that everything dried up rather rapidly in the compression seems to have done very well in that regard. Unfortunately the home health nurse last time she came out to the patient's home was not able to fully remove the alginate from her leg so she just rewrapped it with what was there continuing to be in place. Obviously that does need to come off but overall I'm extremely pleased with the progress it's been made over the past week which is the Kerlex and Coban wraps. 03/18/18-She is here in follow-up evaluation for bilateral lower extremity lymphedema with blister/ulceration. She is essentially healed to the right lower extremity with one residual pinpoint opening, the left lower extremity has multiple pinpoint openings with minimal amount of serous drainage. We will continue with compression over this next week. We have requested that home help initiate the process of ordering compression wraps for long-term management. She will follow-up next week, I anticipate she will be healed at that appointment 03/26/18 on evaluation today patient appears to be doing extremely well in regard to her bilateral lower extremity ulcers. This has completely resolved for the most part on the left lower extremity and has completely resolved in regard to the right lower extremity. She's been tolerating the dressing changes without complication including the  wraps. Overall her swelling is down significantly and I think this accounts for  why she is doing so much better at this time. Overall she is very pleased and having very little discomfort compared to previous. 04/02/18 on evaluation today patient actually appears to be completely healed which is excellent news. She has been tolerating the wraps without complication obviously this has done very well for her. In general she shows no signs of infection or otherwise complicating factors. She's having no pain. Readmission: 06/02/2019 on evaluation today patient appears to be unfortunately experiencing issues with increased swelling of her bilateral lower extremities. I have last seen her in June 2019. At that time everything healed and it does appear that we actually got her some Velcro compression at that time. Nonetheless she states she was wearing that until about 5 weeks ago and then subsequently she quit wearing it she tells me the only excuse that she can give is "laziness". Nonetheless she is not able to get any compression sleeves/stockings on. she tells me currently that she is not experiencing any evidence of infection at this time she is been having some leaking intermittently from her lower extremities right now the left on the lateral portion is the only area open. No fevers, chills, nausea, vomiting, or diarrhea. 06/09/2019 on evaluation today patient actually appears to be doing quite well with regard to her leg ulcers in fact everything appears to be completely closed at this time bilaterally. There is no signs of active infection at this time which is good news. Overall I am very pleased. She did bring her Velcro compression wraps with her today. [name], [name] ([id number]) Electronic Signature(s) Signed: 06/10/2019 8:32:27 AM By: Kristina Keeler PA-C Entered By: [name] on 06/10/2019 08:32:27 [name], [name]  ([id number]) -------------------------------------------------------------------------------- Physical Exam Details Patient Name: [name] Date of Service: 06/09/2019 10:45 AM Medical Record Number: [id number] Patient Account Number: [id number] Date of Birth/Sex: 07/25/1941 (78 y.o. F) Treating RN: [name] Primary Care Provider: Salome Holmes Other Clinician: Referring Provider: Salome Holmes Treating Provider/Extender: STONE III, [name] Weeks in Treatment: 1 Constitutional Well-nourished and well-hydrated in no acute distress. Respiratory normal breathing without difficulty. Psychiatric this patient is able to make decisions and demonstrates good insight into disease process. Alert and Oriented x 3. pleasant and cooperative. Notes Upon inspection patient shows complete epithelialization of all ulcers on both lower extremities she still has a lot of swelling but again I think this is something that is normal and good to be ongoing for her unfortunately. Nonetheless when she wears compression it is much improved. Electronic Signature(s) Signed: 06/10/2019 8:33:00 AM By: Kristina Keeler PA-C Entered By: [name] on 06/10/2019 08:33:00 [name], [name] ([id number]) -------------------------------------------------------------------------------- Physician Orders Details Patient Name: [name] Date of Service: 06/09/2019 10:45 AM Medical Record Number: [id number] Patient Account Number: [id number] Date of Birth/Sex: May 07, 1941 (78 y.o. 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, in  remission Discharge From [facility] o Discharge from [place] complete, Pt to wear own compression stockings Electronic Signature(s) Signed: 06/09/2019 11:49:33 AM By: [name] Signed: 06/12/2019 6:59:38 PM By: Kristina Keeler PA-C Entered By: [name] on 06/09/2019 11:26:31 [name], [name] ([id number]) -------------------------------------------------------------------------------- Problem List Details Patient  Name: SWIFTKarmen [name] Date of Service: 06/09/2019 10:45 AM Medical Record Number: [id number] Patient Account Number: [id number] Date of Birth/Sex: Jul 23, 1941 (78 y.o. 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 F) Treating RN: [name] Primary Care Provider: Salome Holmes Other Clinician: Referring Provider: Salome Holmes Treating Provider/Extender: [name], [name] Weeks in Treatment: 1 Subjective Chief Complaint Information obtained from Patient Bilateral LE Blisters/Ulcers History of Present Illness  (HPI) 03/05/18 on evaluation today patient presents for initial evaluation and our clinic due to significantly large blisters of the bilateral lower extremities which began initially on 02/18/18. 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 Secondarily it does appear that they felt she likely had cellulitis. Subsequently the patient was seen for fault evaluation on 03/03/18 and that is really when the cellulitis was diagnosed. She was actually placed on doxycycline 100 mg by mouth twice a day for 10 days. With that being said she was prescribed prednisone although she did not tolerate this. Apparently she had some alteration in her mental status with taking this. 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 She has never smoked and unfortunately does have pain at the site where these blisters are located of the bilateral lower extremities. She also does appear to have significant lymphedema which obviously has been more chronic. 03/12/18 on evaluation today patient actually appears to be doing much better in regard to her bilateral [employer] ulcers. Unfortunately a lot of the alginate dressing did stick to her legs due to the fact that everything dried up rather rapidly in the compression seems to have done very well in that regard. Unfortunately the home health nurse last time she came out to the patient's home was not able to fully remove the alginate from her leg so she just rewrapped it with what was there continuing to be in place. Obviously that does need to come off but overall I'm extremely pleased with the progress it's been made over the past week which is the Kerlex and Coban wraps. 03/18/18-She is here in follow-up evaluation for bilateral lower extremity lymphedema with blister/ulceration. She is essentially healed to the right lower  extremity with one residual pinpoint opening, the left lower extremity has multiple pinpoint openings with minimal amount of serous drainage. We will continue with compression over this next week. We have requested that home help initiate the process of ordering compression wraps for long-term management. She will follow-up next week, I anticipate she will be healed at that appointment 03/26/18 on evaluation today patient appears to be doing extremely well in regard to her bilateral lower extremity ulcers. This has completely resolved for the most part on  the left lower extremity and has completely resolved in regard to the right lower extremity. She's been tolerating the dressing changes without complication including the wraps. Overall her swelling is down significantly and I think this accounts for why she is doing so much better at this time. Overall she is very pleased and having very little discomfort compared to previous. 04/02/18 on evaluation today patient actually appears to be completely healed which is excellent news. She has been tolerating the wraps without complication obviously this has done very well for her. In general she shows no signs of infection or otherwise complicating factors. She's having no pain. Readmission: 06/02/2019 on evaluation today patient appears to be unfortunately experiencing issues with increased swelling of her bilateral lower extremities. I have last seen her in June 2019. At that time everything healed and it does appear that we actually got her some Velcro compression at that time. Nonetheless she states she was wearing that until about 5 weeks ago and then subsequently she quit wearing it she tells me the only excuse that she can give is "laziness". Nonetheless she is not able to get any compression sleeves/stockings on. she tells me currently that she is not experiencing any evidence of infection at this time she is been having some leaking intermittently  from her lower extremities right now the left on the lateral portion is the [place], ELY BONIFAS. ([id number]) only area open. No fevers, chills, nausea, vomiting, or diarrhea. 06/09/2019 on evaluation today patient actually appears to be doing quite well with regard to her leg ulcers in fact everything appears to be completely closed at this time bilaterally. There is no signs of active infection at this time which is good news. Overall I am very pleased. She did bring her Velcro compression wraps with her today. Patient History Information obtained from Patient. Family History Cancer - Mother, Diabetes - Siblings, Hypertension - Siblings, Lung Disease - Siblings, No family history of Heart Disease, Hereditary Spherocytosis, Kidney Disease, Seizures, Stroke, Thyroid Problems, Tuberculosis. Social History Never smoker, Marital Status - Widowed, Alcohol Use - Never, Drug Use - No History, Caffeine Use - Never. Medical History Eyes Denies history of Cataracts, Glaucoma, Optic Neuritis Ear/Nose/Mouth/Throat Patient has history of Chronic sinus problems/congestion - allergys seasonal Denies history of Middle ear problems Hematologic/Lymphatic Patient has history of Anemia, Lymphedema Denies history of Hemophilia, Human Immunodeficiency Virus, Sickle Cell Disease Respiratory Denies history of Aspiration, Asthma, Chronic Obstructive Pulmonary Disease (COPD), Pneumothorax, Sleep Apnea, Tuberculosis Cardiovascular Patient has history of Angina, Arrhythmia - murmur, Hypertension Denies history of Congestive Heart Failure, Coronary Artery Disease, Deep Vein Thrombosis, Hypotension, Myocardial Infarction, Peripheral Arterial Disease, Peripheral Venous Disease, Phlebitis, Vasculitis Gastrointestinal Denies history of Cirrhosis , Colitis, Crohn s, Hepatitis A, Hepatitis B, Hepatitis C Endocrine Denies history of Type I Diabetes, Type II Diabetes Genitourinary Denies history of End Stage Renal  Disease Immunological Denies history of Lupus Erythematosus, Raynaud s, Scleroderma Integumentary (Skin) Denies history of History of Burn, History of pressure wounds Musculoskeletal Patient has history of Gout, Rheumatoid Arthritis, Osteoarthritis Denies history of Osteomyelitis Neurologic Denies history of Dementia, Neuropathy, Quadriplegia, Paraplegia, Seizure Disorder Oncologic Denies history of Received Chemotherapy, Received Radiation Psychiatric Denies history of Anorexia/bulimia, Confinement Anxiety Review of Systems (ROS) Constitutional Symptoms (General Health) Denies complaints or symptoms of Fatigue, Fever, Chills, Marked Weight Change. Respiratory Denies complaints or symptoms of Chronic or frequent coughs, Shortness of Breath. Cardiovascular Biffle, Taylie M. ([id number]) Complains or has symptoms of LE edema. Denies complaints or symptoms of  Chest pain. Psychiatric Denies complaints or symptoms of Anxiety, Claustrophobia. Objective Constitutional Well-nourished and well-hydrated in no acute distress. Vitals Time Taken: 10:52 AM, Height: 65 in, Weight: 147 lbs, BMI: 24.5, Temperature: 98.0 F, Pulse: 58 bpm, Respiratory Rate: 16 breaths/min, Blood Pressure: 173/78 mmHg. Respiratory normal breathing without difficulty. Psychiatric this patient is able to make decisions and demonstrates good insight into disease process. Alert and Oriented x 3. pleasant and cooperative. General Notes: Upon inspection patient shows complete epithelialization of all ulcers on both lower extremities she still has a lot of swelling but again I think this is something that is normal and good to be ongoing for her unfortunately. Nonetheless when she wears compression it is much improved. Integumentary (Hair, Skin) Wound #3 status is Healed - Epithelialized. Original cause of wound was Gradually Appeared. The wound is located on the Left,Lateral Lower Leg. The wound measures 0cm length x  0cm width x 0cm depth; 0cm^2 area and 0cm^3 volume. There is Fat Layer (Subcutaneous Tissue) Exposed exposed. There is no tunneling or undermining noted. There is a medium amount of serous drainage noted. The wound margin is indistinct and nonvisible. There is no granulation within the wound bed. There is a small (1-33%) amount of necrotic tissue within the wound bed including Adherent Slough. 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 ([id number]) Plan Discharge From [facility] Services: Discharge from [place] - Treatment complete, Pt to wear own compression stockings At this point I would recommend that we discontinue wound care services with the patient utilizing her own compression stockings. She is in agreement with this plan. If anything changes or worsens in the meantime she will contact the office and let me know. Follow-up as needed Electronic Signature(s) Signed: 06/10/2019 8:33:18 AM By: Kristina Keeler PA-C Entered By: [name] on 06/10/2019 08:33:17 Mcvay, [name] ([id number]) -------------------------------------------------------------------------------- ROS/PFSH Details Patient Name: [name] Date of Service: 06/09/2019 10:45 AM Medical Record Number: [id number] Patient Account Number: [id number] Date of Birth/Sex: 1940/12/17 (78 y.o. F) Treating RN: [name] Primary Care Provider: Salome Holmes Other Clinician: Referring Provider: Salome Holmes Treating Provider/Extender: [name], [name] Weeks in Treatment: 1 Information Obtained From Patient Constitutional Symptoms (General Health) Complaints and Symptoms: Negative for: Fatigue; Fever; Chills; Marked Weight Change Respiratory Complaints and Symptoms: Negative for: Chronic or frequent coughs; Shortness of Breath Medical  History: Negative for: Aspiration; Asthma; Chronic Obstructive Pulmonary Disease (COPD); Pneumothorax; Sleep Apnea; Tuberculosis Cardiovascular Complaints and Symptoms: Positive for: LE edema Negative for: Chest pain Medical History: Positive for: Angina; Arrhythmia - murmur; Hypertension Negative for: Congestive Heart Failure; Coronary Artery Disease; Deep Vein Thrombosis; Hypotension; Myocardial Infarction; Peripheral Arterial Disease; Peripheral Venous Disease; Phlebitis; Vasculitis Psychiatric Complaints and Symptoms: Negative for: Anxiety; Claustrophobia Medical History: Negative for: Anorexia/bulimia; Confinement Anxiety Eyes Medical History: Negative for: Cataracts; Glaucoma; Optic Neuritis Ear/Nose/Mouth/Throat Medical History: Positive for: Chronic sinus problems/congestion - allergys seasonal Negative for: Middle ear problems Hematologic/Lymphatic Medical History: Positive for: Anemia; Lymphedema Looper, Nalanie M. ([id number]) Negative for: Hemophilia; Human Immunodeficiency Virus; Sickle Cell Disease Gastrointestinal Medical History: Negative for: Cirrhosis ; Colitis; Crohnos; Hepatitis A; Hepatitis B; Hepatitis C Endocrine Medical History: Negative for: Type I Diabetes; Type II Diabetes Genitourinary Medical History: Negative for: End Stage Renal Disease Immunological Medical History: Negative for: Lupus Erythematosus; Raynaudos; Scleroderma Integumentary (Skin) Medical History: Negative for: History of Burn; History of  pressure wounds Musculoskeletal Medical History: Positive for: Gout; Rheumatoid Arthritis; Osteoarthritis Negative for: Osteomyelitis Neurologic Medical History: Negative for: Dementia; Neuropathy; Quadriplegia; Paraplegia; Seizure Disorder Oncologic Medical History: Negative for: Received Chemotherapy; Received Radiation HBO Extended History Items Ear/Nose/Mouth/Throat: Chronic sinus problems/congestion Immunizations Pneumococcal  Vaccine: Received Pneumococcal Vaccination: No Implantable Devices None Family and Social History Cancer: Yes - Mother; Diabetes: Yes - Siblings; Heart Disease: No; Hereditary Spherocytosis: No; Hypertension: Yes - Siblings; Kidney Disease: No; Lung Disease: Yes - Siblings; Seizures: No; Stroke: No; Thyroid Problems: No; Tuberculosis: Wormley, Niasia M. ([id number]) No; Never smoker; Marital Status - Widowed; Alcohol Use: Never; Drug Use: No History; Caffeine Use: Never; Financial Concerns: No; Food, Clothing or Shelter Needs: No; Support System Lacking: No; Transportation Concerns: No Physician Affirmation I have reviewed and agree with the above information. Electronic Signature(s) Signed: 06/10/2019 9:37:06 AM By: [name] Signed: 06/12/2019 6:59:38 PM By: Kristina Keeler PA-C Entered By: [name] on 06/10/2019 08:32:47 Schoffstall, [name] ([id number]) -------------------------------------------------------------------------------- SuperBill Details Patient Name: [name] Date of Service: 06/09/2019 Medical Record Number: [id number] Patient Account Number: [id number] Date of Birth/Sex: [birth year] (78 y.o. 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

## 2019-06-10 NOTE — Progress Notes (Signed)
[name] ([id number]) Visit Report for 06/09/2019 Arrival Information Details Patient Name: [name] Date of Service: 06/09/2019 10:45 AM Medical Record Number: [id number] Patient Account Number: [id number] Date of Birth/Sex: 08-12-1941 (78 y.o. F) Treating RN: [name] Primary Care [name]: [name] Other Clinician: Referring [name]: [name] Treating Tritia Endo/Extender: [name] Weeks in Treatment: 1 Visit Information History Since Last Visit Added or deleted any medications: No Patient Arrived: [name] Any new allergies or adverse reactions: No Arrival Time: 10:50 Had a fall or experienced change in No Accompanied By: self activities of daily living that may affect Transfer Assistance: None risk of falls: Patient Identification Verified: Yes Signs or symptoms of abuse/neglect since last visito No Secondary Verification Process Completed: Yes Hospitalized since last visit: No Patient Requires Transmission-Based No Implantable device outside of the clinic excluding No Precautions: cellular tissue based products placed in the center Patient Has Alerts: Yes since last visit: Patient Alerts: NOT Has Compression in Place as Prescribed: Yes Diabetic Pain Present Now: No Electronic Signature(s) Signed: 06/10/2019 9:32:15 AM By: Kristina Cool, BSN, RN, CWS, Kim RN, BSN Entered By: [name] on 06/09/2019 10:51:40 [name], [name] ([id number]) -------------------------------------------------------------------------------- Clinic Level of Care Assessment Details Patient Name: [name] Date of Service: 06/09/2019 10:45 AM Medical Record Number: [id number] Patient Account Number: [id number] Date of Birth/Sex: 1940/12/06 (78 y.o. F) Treating RN: [name] Primary Care Nealy Hickmon: [name] Other Clinician: Referring [name]: [name] Treating [name]/Extender: [name] Weeks in Treatment: 1 Clinic Level of Care  Assessment Items TOOL 4 Quantity Score []  - Use when only an EandM is performed on FOLLOW-UP visit 0 ASSESSMENTS - Nursing Assessment / Reassessment X - Reassessment of Co-morbidities (includes updates in patient status) 1 10 X- 1 5 Reassessment of Adherence to Treatment Plan ASSESSMENTS - Wound and Skin Assessment / Reassessment X - Simple Wound Assessment / Reassessment - one wound 1 5 []  - 0 Complex Wound Assessment / Reassessment - multiple wounds []  - 0 Dermatologic / Skin Assessment (not related to wound area) ASSESSMENTS - Focused Assessment []  - Circumferential Edema Measurements - multi extremities 0 []  - 0 Nutritional Assessment / Counseling / Intervention []  - 0 Lower Extremity Assessment (monofilament, tuning fork, pulses) []  - 0 Peripheral Arterial Disease Assessment (using hand held doppler) ASSESSMENTS - Ostomy and/or Continence Assessment and Care []  - Incontinence Assessment and Management 0 []  - 0 Ostomy Care Assessment and Management (repouching, etc.) PROCESS - Coordination of Care X - Simple Patient / Family Education for ongoing care 1 15 []  - 0 Complex (extensive) Patient / Family Education for ongoing care X- 1 10 Staff obtains Programmer, systems, Records, Test Results / Process Orders []  - 0 Staff telephones HHA, Nursing Homes / Clarify orders / etc []  - 0 Routine Transfer to another Facility (non-emergent condition) []  - 0 Routine Hospital Admission (non-emergent condition) []  - 0 New Admissions / Biomedical engineer / Ordering NPWT, Apligraf, etc. []  - 0 Emergency Hospital Admission (emergent condition) X- 1 10 Simple Discharge Coordination [name], Kristina M. ([id number]) []  - 0 Complex (extensive) Discharge Coordination PROCESS - Special Needs []  - Pediatric / Minor Patient Management 0 []  - 0 Isolation Patient Management []  - 0 Hearing / Language / Visual special needs []  - 0 Assessment of Community assistance (transportation, D/C planning,  etc.) []  - 0 Additional assistance / Altered mentation []  - 0 Support Surface(s) Assessment (bed, cushion, seat, etc.) INTERVENTIONS - Wound Cleansing / Measurement X - Simple  Wound Cleansing - one wound 1 5 []  - 0 Complex Wound Cleansing - multiple wounds X- 1 5 Wound Imaging (photographs - any number of wounds) []  - 0 Wound Tracing (instead of photographs) X- 1 5 Simple Wound Measurement - one wound []  - 0 Complex Wound Measurement - multiple wounds INTERVENTIONS - Wound Dressings []  - Small Wound Dressing one or multiple wounds 0 []  - 0 Medium Wound Dressing one or multiple wounds []  - 0 Large Wound Dressing one or multiple wounds []  - 0 Application of Medications - topical []  - 0 Application of Medications - injection INTERVENTIONS - Miscellaneous []  - External ear exam 0 []  - 0 Specimen Collection (cultures, biopsies, blood, body fluids, etc.) []  - 0 Specimen(s) / Culture(s) sent or taken to Lab for analysis []  - 0 Patient Transfer (multiple staff / Civil Service fast streamer / Similar devices) []  - 0 Simple Staple / Suture removal (25 or less) []  - 0 Complex Staple / Suture removal (26 or more) []  - 0 Hypo / Hyperglycemic Management (close monitor of Blood Glucose) []  - 0 Ankle / Brachial Index (ABI) - do not check if billed separately X- 1 5 Vital Signs [name], Kristina M. ([id number]) Has the patient been seen at the hospital within the last three years: Yes Total Score: 75 Level Of Care: New/Established - Level 2 Electronic Signature(s) Signed: 06/09/2019 11:49:33 AM By: [name] Entered By: [name] on 06/09/2019 11:27:07 [name], [name] ([id number]) -------------------------------------------------------------------------------- Encounter Discharge Information Details Patient Name: [name] Date of Service: 06/09/2019 10:45 AM Medical Record Number: [id number] Patient Account Number: [id number] Date of Birth/Sex: [birth year] (78 y.o. F) Treating RN: [name] Primary Care [name]: [name] Other Clinician: Referring [name]: [name] Treating [name]/Extender: [name] Weeks in Treatment: 1 Encounter Discharge Information Items Discharge Condition: Stable Ambulatory Status: Ambulatory Discharge Destination: Home Transportation: Private Auto Accompanied By: self Schedule Follow-up Appointment: Yes Clinical Summary of Care: Electronic Signature(s) Signed: 06/09/2019 11:49:33 AM By: [name] Entered By: [name] on 06/09/2019 11:28:04 Kropf, [name] ([id number]) -------------------------------------------------------------------------------- Lower Extremity Assessment Details Patient Name: [name] Date of Service: 06/09/2019 10:45 AM Medical Record Number: [id number] Patient Account Number: [id number] Date of Birth/Sex: 1940/10/27 (78 y.o. F) Treating RN: [name] Primary Care Wolf Boulay: [name] Other Clinician: Referring Xavier Munger: [name] Treating Niel Peretti/Extender: [name] Weeks in Treatment: 1 Edema Assessment Assessed: [Left: Yes] [Right: Yes] Edema: [Left: Yes] [Right: Yes] Calf Left: Right: Point of Measurement: 34 cm From Medial Instep 36 cm 25 cm Ankle Left: Right: Point of Measurement: 12 cm From Medial Instep 31.6 cm 28.5 cm Vascular Assessment Pulses: Dorsalis Pedis Palpable: [Left:Yes] [Right:Yes] Electronic Signature(s) Signed: 06/10/2019 9:32:15 AM By: Kristina Cool, BSN, RN, CWS, Kim RN, BSN Entered By: [name] on 06/09/2019 11:03:49 Daffern, [name] ([id number]) -------------------------------------------------------------------------------- Multi Wound Chart Details Patient Name: [name] Date of Service: 06/09/2019 10:45 AM Medical Record Number: [id number] Patient Account Number: [id number] Date of Birth/Sex: 1941/06/16 (78 y.o. F) Treating RN: [name] Primary Care Kaylie Ritter: [name] Other Clinician: Referring Zayna Toste:  [name] Treating Atzin Buchta/Extender: [name] Weeks in Treatment: 1 Vital Signs Height(in): 65 Pulse(bpm): 4 Weight(lbs): 147 Blood Pressure(mmHg): 173/78 Body Mass Index(BMI): 24 Temperature(F): 98.0 Respiratory Rate 16 (breaths/min): Photos: [N/A:N/A] Wound Location: Left Lower Leg - Lateral N/A N/A Wounding Event: Gradually Appeared N/A N/A Primary Etiology: Lymphedema N/A N/A Comorbid History: Chronic sinus N/A N/A problems/congestion, Anemia, Lymphedema, Angina, Arrhythmia, Hypertension, Gout, Rheumatoid  Arthritis, Osteoarthritis Date Acquired: 05/09/2019 N/A N/A Weeks of Treatment: 1 N/A N/A Wound Status: Open N/A N/A Measurements L x W x D 0.6x0.5x0.1 N/A N/A (cm) Area (cm) : 0.236 N/A N/A Volume (cm) : 0.024 N/A N/A % Reduction in Area: 0.00% N/A N/A % Reduction in Volume: 0.00% N/A N/A Classification: Full Thickness Without N/A N/A Exposed Support Structures Exudate Amount: Medium N/A N/A Exudate Type: Serous N/A N/A Exudate Color: amber N/A N/A Wound Margin: Indistinct, nonvisible N/A N/A Granulation Amount: None Present (0%) N/A N/A Necrotic Amount: Small (1-33%) N/A N/A Exposed Structures: Fat Layer (Subcutaneous N/A N/A Tissue) Exposed: Yes Fascia: No Whelchel, Kenyonna M. ([id number]) Tendon: No Muscle: No Joint: No Bone: No Epithelialization: Large (67-100%) N/A N/A Treatment Notes Electronic Signature(s) Signed: 06/09/2019 11:49:33 AM By: [name] Entered By: [name] on 06/09/2019 11:22:51 Minner, [name] ([id number]) -------------------------------------------------------------------------------- Multi-Disciplinary Care Plan Details Patient Name: [name] Date of Service: 06/09/2019 10:45 AM Medical Record Number: [id number] Patient Account Number: [id number] Date of Birth/Sex: 07/07/1941 (78 y.o. F) Treating RN: [name] Primary Care Yanis Juma: [name] Other Clinician: Referring Cuinn Westerhold: [name] Treating Hydeia Mcatee/Extender: [name] Weeks in Treatment: 1 Active Inactive Electronic Signature(s) Signed: 06/09/2019 11:49:33 AM By: [name] Entered By: [name] on 06/09/2019 11:25:56 Sargeant, [name] ([id number]) -------------------------------------------------------------------------------- Pain Assessment Details Patient Name: [name] Date of Service: 06/09/2019 10:45 AM Medical Record Number: [id number] Patient Account Number: [id number] Date of Birth/Sex: 1941-03-24 (78 y.o. F) Treating RN: [name] Primary Care Zackrey Dyar: [name] Other Clinician: Referring Talmage Teaster: [name] Treating Emmery Seiler/Extender: [name] Weeks in Treatment: 1 Active Problems Location of Pain Severity and Description of Pain Patient Has Paino No Site Locations Pain Management and Medication Current Pain Management: Notes Patient denies pain at this time. Electronic Signature(s) Signed: 06/10/2019 9:32:15 AM By: Kristina Cool, BSN, RN, CWS, Kim RN, BSN Entered By: [name] on 06/09/2019 10:51:58 Mauro, [name] ([id number]) -------------------------------------------------------------------------------- Patient/Caregiver Education Details Patient Name: [name] Date of Service: 06/09/2019 10:45 AM Medical Record Number: [id number] Patient Account Number: [id number] Date of Birth/Gender: August 30, 1941 (78 y.o. F) Treating RN: [name] Primary Care Physician: [name] Other Clinician: Referring Physician: Salome [name] Treating Physician/Extender: Sharalyn Ink in Treatment: 1 Education Assessment Education Provided To: Patient Education Topics Provided Wound/Skin Impairment: Handouts: Caring for Your Ulcer Methods: Demonstration, Explain/Verbal Responses: State content correctly Electronic Signature(s) Signed: 06/09/2019 11:49:33 AM By: [name] Entered By: [name] on 06/09/2019 11:27:23 Harbison, Sihaam Jerilynn [name]  ([id number]) -------------------------------------------------------------------------------- Wound Assessment Details Patient Name: [name] Date of Service: 06/09/2019 10:45 AM Medical Record Number: [id number] Patient Account Number: [id number] Date of Birth/Sex: 25-May-1941 (78 y.o. F) Treating RN: [name] Primary Care Nader Boys: [name] Other Clinician: Referring Alcario Tinkey: [name] Treating Shaunika Italiano/Extender: [name] Weeks in Treatment: 1 Wound Status Wound Number: 3 Primary Lymphedema Etiology: Wound Location: Left Lower Leg - Lateral Wound Healed - Epithelialized Wounding Event: Gradually Appeared Status: Date Acquired: 05/09/2019 Comorbid Chronic sinus problems/congestion, Anemia, Weeks Of Treatment: 1 History: Lymphedema, Angina, Arrhythmia, Hypertension, Clustered Wound: No Gout, Rheumatoid Arthritis, Osteoarthritis Photos Wound Measurements Length: (cm) 0 % Reduc Width: (cm) 0 % Reduc Depth: (cm) 0 Epithel Area: (cm) 0 Tunnel Volume: (cm) 0 Underm tion in Area: 100% tion in Volume: 100% ialization: Large (67-100%) ing: No ining: No Wound Description Full Thickness Without Exposed Support Foul O Classification: Structures Slough Wound Margin: Indistinct, nonvisible Exudate Medium Amount: Exudate Type: Serous  Exudate Color: amber dor After Cleansing: No /Fibrino Yes Wound Bed Granulation Amount: None Present (0%) Exposed Structure Necrotic Amount: Small (1-33%) Fascia Exposed: No Necrotic Quality: Adherent Slough Fat Layer (Subcutaneous Tissue) Exposed: Yes Tendon Exposed: No Muscle Exposed: No Joint Exposed: No Bone Exposed: No Bevins, Tashea M. ([id number]) Electronic Signature(s) Signed: 06/09/2019 11:49:33 AM By: [name] Entered By: [name] on 06/09/2019 11:25:34 Stuard, [name] ([id number]) -------------------------------------------------------------------------------- Vitals Details Patient Name: [name] Date of Service: 06/09/2019 10:45 AM Medical Record Number: [id number] Patient Account Number: [id number] Date of Birth/Sex: 1941/10/13 (78 y.o. F) Treating RN: [name] Primary Care Rodolfo Gaster: [name] Other Clinician: Referring Joanell Cressler: [name] Treating Davionne Dowty/Extender: [name] Weeks in Treatment: 1 Vital Signs Time Taken: 10:52 Temperature (F): 98.0 Height (in): 65 Pulse (bpm): 58 Weight (lbs): 147 Respiratory Rate (breaths/min): 16 Body Mass Index (BMI): 24.5 Blood Pressure (mmHg): 173/78 Reference Range: 80 - 120 mg / dl Electronic Signature(s) Signed: 06/10/2019 9:32:15 AM By: Kristina Cool, BSN, RN, CWS, Kim RN, BSN Entered By: [name] on 06/09/2019 10:54:39

## 2019-08-08 ENCOUNTER — Other Ambulatory Visit: Payer: Self-pay | Admitting: Family Medicine

## 2019-08-08 DIAGNOSIS — Z1231 Encounter for screening mammogram for malignant neoplasm of breast: Secondary | ICD-10-CM

## 2019-10-19 ENCOUNTER — Other Ambulatory Visit: Payer: Self-pay

## 2019-10-19 ENCOUNTER — Ambulatory Visit
Admission: RE | Admit: 2019-10-19 | Discharge: 2019-10-19 | Disposition: A | Discharge status: Home / Self Care | Payer: Medicare Other | Source: Ambulatory Visit | Attending: Family Medicine | Admitting: Family Medicine

## 2019-10-19 DIAGNOSIS — Z1231 Encounter for screening mammogram for malignant neoplasm of breast: Secondary | ICD-10-CM | POA: Diagnosis present

## 2019-11-12 NOTE — Progress Notes (Signed)
[facility]  [address], [address] [place], [place] [address] Phone: [phone]  Fax: [phone]   Clinic Day:  11/16/2019  Referring physician: [name], MD  Chief Complaint: [name] is a 79 y.o. 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  HPI: The patient was last seen in the medical oncology clinic on 05/18/2019 for new patient assessment. At that time, she denied any fevers, sweats or weight loss.  She had shortness of breath on exertion.  Exam revealed no adenopathy or hepatosplenomegaly.  CBC revealed a hematocrit of 35.7, hemoglobin 11.0, MCV 92.5, platelets 141,000, white count 11,000 with an ANC of 6200.  Absolute monocyte count was 3600.  LDH was 115.  The patient was seen by Dr Iona Beard on 08/04/2019.  CBC revealed a hematocrit of 40.4, hemoglobin 12.3, MCV 97, platelets 130,000, and WBC 12,600.  Bilateral mammogram on 10/19/2019 showed no mammographic evidence of malignancy.  During the interim, she notes that her blood pressure has been elevated; she believes it is elevated when she goes to see a doctor (whitecoat hypertension). It is normal by the time she leaves.  She denies having any infection.  She had itching and small bump on her left arm at the same site as her flu shot; she notes bruising and itching for 1 week.  She was told to wear compression stockings for her lower leg edema.  She takes Tylenol only for discomfort.  She does not take aspirin or NSAIDs.    Past Medical History:  Diagnosis Date  . Allergic rhinitis   . Arthritis   . CML (chronic myelocytic leukemia) ([place])   . GERD (gastroesophageal reflux disease)   . Gout   . History of goiter   . Hyperlipidemia   . Hypertension   . Hypothyroidism   . IDA (iron deficiency anemia)   . ITP (idiopathic thrombocytopenic purpura)   . Urinary incontinence     Past Surgical History:  Procedure Laterality Date  .  INCISION AND DRAINAGE Right 06/05/2015   Procedure: INCISION AND DRAINAGE, RIGHT WRIST;  Surgeon: Milly Jakob, MD;  Location: [place];  Service: Orthopedics;  Laterality: Right;    Family History  Problem Relation Age of Onset  . Heart attack Mother   . Hypertension Mother   . Cancer Father   . Hypertension Father   . Breast cancer Paternal Grandmother   . Diabetes Mellitus II Sister     Social History:  reports that she has never smoked. She has never used smokeless tobacco. She reports that she does not drink alcohol or use drugs. She denies any alcohol or tobacco use. She denies any exposure to radiation or toxins. She is retired but used to work in a [employer]. She lives in [place]. The patient is alone  today.  Allergies:  Allergies  Allergen Reactions  . Sulfa Antibiotics Rash    Katherina Right Syndrome  . Sulfamethoxazole-Trimethoprim Hives and Rash    Avel Sensor   . Aspirin Rash    Current Medications: Current Outpatient Medications  Medication Sig Dispense Refill  . acetaminophen (TYLENOL) 650 MG CR tablet Take 1 tablet (650 mg total) by mouth every 6 (six) hours. 30 tablet 0  . amLODipine (NORVASC) 10 MG tablet Take 10 mg by mouth daily.     . Calcium Carbonate-Vitamin D (CALCIUM-VITAMIN D) 500-200 MG-UNIT tablet Take 1 tablet by mouth daily.    . chlorthalidone (HYGROTON) 25 MG  tablet Take 0.5 tablets by mouth daily.    . Cholecalciferol (VITAMIN D3) 1000 units CAPS Take 1 capsule by mouth 1 day or 1 dose.    . docusate sodium (COLACE) 100 MG capsule Take 100 mg by mouth 2 (two) times daily.    Marland Kitchen ethacrynic acid (EDECRIN) 25 MG tablet Take 1 tablet by mouth daily.    . febuxostat (ULORIC) 40 MG tablet Take 40 mg by mouth daily.     . Fish Oil-Cholecalciferol (FISH OIL + D3 PO) Take by mouth daily.    . fluticasone (FLONASE) 50 MCG/ACT nasal spray Place 1 spray into both nostrils daily. Reported on [id number]  11  . folic acid (FOLVITE) 1 MG tablet Take 1 tablet  (1 mg total) by mouth daily. 30 tablet 0  . hydrALAZINE (APRESOLINE) 25 MG tablet Take 25 mg by mouth daily.     . hydroxychloroquine (PLAQUENIL) 200 MG tablet Take 200 mg by mouth daily.    Marland Kitchen ipratropium (ATROVENT) 0.06 % nasal spray Place 2 sprays into both nostrils as needed. Head cold symptoms    . losartan (COZAAR) 25 MG tablet Take 25 mg by mouth daily.     . metoprolol tartrate (LOPRESSOR) 25 MG tablet Take 25 mg by mouth 2 (two) times daily. Reported on 12/18/2015    . Multiple Vitamin (MULTI-VITAMIN) tablet Take 1 tablet by mouth daily.     . Multiple Vitamins-Minerals (ICAPS) CAPS Take 1 capsule by mouth 1 day or 1 dose.    . oxybutynin (DITROPAN) 5 MG tablet Take 5 mg by mouth daily.  5  . pantoprazole (PROTONIX) 40 MG tablet Take 40 mg by mouth daily before breakfast.    . Polyethyl Glycol-Propyl Glycol (SYSTANE) 0.4-0.3 % SOLN Apply to eye.    . potassium chloride (K-DUR,KLOR-CON) 10 MEQ tablet Take 1 tablet by mouth daily.  11  . potassium chloride (KLOR-CON) 10 MEQ tablet Take 10 mEq by mouth daily.      No current facility-administered medications for this visit.    Review of Systems  Constitutional: Negative.  Negative for chills, diaphoresis, fever, malaise/fatigue and weight loss (up 5 lbs).       Feels "good".  HENT: Negative.  Negative for congestion, ear pain, hearing loss, nosebleeds, sinus pain and sore throat.   Eyes: Negative.  Negative for blurred vision, double vision and photophobia.  Respiratory: Positive for shortness of breath (with exertion). Negative for cough, sputum production and wheezing.   Cardiovascular: Positive for leg swelling (BLE). Negative for chest pain, palpitations, orthopnea and PND.  Gastrointestinal: Positive for constipation and heartburn (reflux- improving, no longer taking meds). Negative for abdominal pain, blood in stool, diarrhea, melena, nausea and vomiting.  Genitourinary: Positive for urgency. Negative for dysuria, frequency and  hematuria.  Musculoskeletal: Positive for back pain and joint pain (arthritic, hands, wrists, legs). Negative for myalgias and neck pain.  Skin: Positive for itching. Negative for rash.  Neurological: Negative.  Negative for dizziness, tingling, sensory change, speech change, focal weakness, weakness and headaches.  Endo/Heme/Allergies: Bruises/bleeds easily (bruising on left arm).  Psychiatric/Behavioral: Negative.  Negative for depression, memory loss and substance abuse. The patient is not nervous/anxious and does not have insomnia.   All other systems reviewed and are negative.  Performance status (ECOG): 1  Vitals Blood pressure (!) 168/82, pulse 76, temperature 98 F (36.7 C), temperature source Tympanic, resp. rate 18, weight 141 lb 10.3 oz (64.3 kg), SpO2 94 %.   Physical Exam  Constitutional: She  is oriented to person, place, and time. She appears well-developed and well-nourished. No distress.  She has a cane at her side.   HENT:  Head: Normocephalic and atraumatic.  Mouth/Throat: Oropharynx is clear and moist. No oropharyngeal exudate.  Short graying hair.  Mask.  Eyes: Pupils are equal, round, and reactive to light. Conjunctivae and EOM are normal. No scleral icterus.  Brown eyes.  Neck: No JVD present.  Cardiovascular: Normal rate, regular rhythm and normal heart sounds.  No murmur heard. Pulmonary/Chest: Effort normal and breath sounds normal. No respiratory distress. She has no wheezes. She has no rales.  Abdominal: Soft. Bowel sounds are normal. She exhibits no distension and no mass. There is no splenomegaly or hepatomegaly. There is no abdominal tenderness. There is no rebound and no guarding.  Musculoskeletal:        General: Tenderness (BLE) and edema (2-3+ BLE, chronic L>R) present. Normal range of motion.     Cervical back: Normal range of motion and neck supple.     Comments: Right hand flexed.  Lymphadenopathy:       Head (right side): No preauricular, no  posterior auricular and no occipital adenopathy present.       Head (left side): No preauricular, no posterior auricular and no occipital adenopathy present.    She has no cervical adenopathy.    She has no axillary adenopathy.       Right: No inguinal and no supraclavicular adenopathy present.       Left: No inguinal and no supraclavicular adenopathy present.  Neurological: She is alert and oriented to person, place, and time.  Skin: Skin is warm and dry. Ecchymosis noted. She is not diaphoretic.  Upper left arm bruising  (9.5 cm x 10.5 cm)  Psychiatric: She has a normal mood and affect. Her behavior is normal. Judgment and thought content normal.  Nursing note and vitals reviewed.   Appointment on 11/16/2019  Component Date Value Ref Range Status  . LDH 11/16/2019 119  98 - 192 U/L Final   Performed at [facility], [address], [place], [place] [address]  . Sodium 11/16/2019 140  135 - 145 mmol/L Final  . Potassium 11/16/2019 3.8  3.5 - 5.1 mmol/L Final  . Chloride 11/16/2019 102  98 - 111 mmol/L Final  . CO2 11/16/2019 29  22 - 32 mmol/L Final  . Glucose, Bld 11/16/2019 96  70 - 99 mg/dL Final  . BUN 11/16/2019 38* 8 - 23 mg/dL Final  . Creatinine, Ser 11/16/2019 1.49* 0.44 - 1.00 mg/dL Final  . Calcium 11/16/2019 9.0  8.9 - 10.3 mg/dL Final  . Total Protein 11/16/2019 8.1  6.5 - 8.1 g/dL Final  . Albumin 11/16/2019 3.6  3.5 - 5.0 g/dL Final  . AST 11/16/2019 13* 15 - 41 U/L Final  . ALT 11/16/2019 15  0 - 44 U/L Final  . Alkaline Phosphatase 11/16/2019 46  38 - 126 U/L Final  . Total Bilirubin 11/16/2019 1.0  0.3 - 1.2 mg/dL Final  . GFR calc non Af Amer 11/16/2019 34* >60 mL/min Final  . GFR calc Af Amer 11/16/2019 39* >60 mL/min Final  . Anion gap 11/16/2019 9  5 - 15 Final   Performed at [facility], [address], [place], [place] [address]    Assessment:  [name] is a 79 y.o. 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 She presented with  weight loss. WBC has ranged 25,000 - 30,000 with predominant  monocytosis.   Bone marrow aspirate and biopsy on 08/22/2015 revealed an increased atypical monocytic cells (30%) with multi-lineage dysplaisa.  There was no significant myeloid abnormalities or increase in blasts (< 5%).   Cytogenetics were normal (32, XX).  Findings favored a diagnosis of CMML.   She has a history of chronic normocytic anemia felt secondary to chronic kidney disease (CKD).   She has previously been treated with Venofer and blood transfusions. She has received Venofer on 03/06/2015 and 03/28/2015.  Ferritin has been followed: 270 on 04/27/2013, 293 on 04/26/2013, 286 on 06/16/2014, 430 on 02/13/2015, 393 on 03/20/2015, 524 on 04/05/2015, 373 on 04/24/2015, 495 on 05/15/2015, and 659 on 07/03/2015.   She has a history of idiopathic thrombocytopenia purpura (ITP).  Platelet count has ranged between 90,000 - 130,000.  She was previously treated with prednisone.   Symptomatically, she has had some pruritus and bruising over her left upper arm.  She denies any fever, sweats or weight loss.  Exam reveals no adenopathy or hepatosplenomegaly.  Plan: 1.   Labs today: CBC with diff, CMP, LDH.  2.   Chronic myelomonocytic leukemia (CMML)               Hematocrit 39.2.  Hemoglobin 11.8.  MCV 95.6.  Platelets 115,000.  WBC 11,300 (monocyte count 33%; 3300).  Marrow and labs suggests CMML-0.             Patient has low risk disease.                         Risk factors:  age (> 45), WBC (> 15,000), hemoglobin (< 10), platelet count (< 100,000), ASXL mutation.                         Risk factors for patient include age only.             Indications for treatment include:                         B symptoms, change in counts (increased WBC/blasts, increased cytopenias), and organ involvement.                         Treatment typically consists of hydroxyurea or hypomethylating agents.             No indication for  treatment.  Continue surveillance. 3.   Elevated blood pressure  Patient suggests white coat hypertension.  Recheck blood pressure. 4.   RTC in 3 months for labs (CBC with diff). 5.   RTC in 6 months for MD assessment and labs (CBC with diff, CMP, LDH).  I discussed the assessment and treatment plan with the patient.  The patient was provided an opportunity to ask questions and all were answered.  The patient agreed with the plan and demonstrated an understanding of the instructions.  The patient was advised to call back if the symptoms worsen or if the condition fails to improve as anticipated.   Lequita Asal, MD, PhD    11/16/2019, 11:33 AM  I, Samul Dada, am acting as a scribe for Lequita Asal, MD.  I, [place] Mike Gip, MD, have reviewed the above documentation for accuracy and completeness, and I agree with the above.

## 2019-11-15 ENCOUNTER — Other Ambulatory Visit: Payer: Self-pay

## 2019-11-15 NOTE — Progress Notes (Signed)
Confirmed Name and DOB. Denies any concerns.  

## 2019-11-16 ENCOUNTER — Encounter: Payer: Self-pay | Admitting: Hematology and Oncology

## 2019-11-16 ENCOUNTER — Inpatient Hospital Stay: Payer: Medicare Other

## 2019-11-16 ENCOUNTER — Inpatient Hospital Stay: Payer: Medicare Other | Attending: Hematology and Oncology | Admitting: Hematology and Oncology

## 2019-11-16 VITALS — BP 161/77 | HR 68 | Temp 98.0°F | Resp 18 | Wt 141.6 lb

## 2019-11-16 DIAGNOSIS — D693 Immune thrombocytopenic purpura: Secondary | ICD-10-CM | POA: Diagnosis not present

## 2019-11-16 DIAGNOSIS — R600 Localized edema: Secondary | ICD-10-CM | POA: Insufficient documentation

## 2019-11-16 DIAGNOSIS — D649 Anemia, unspecified: Secondary | ICD-10-CM | POA: Diagnosis not present

## 2019-11-16 DIAGNOSIS — C9310 Chronic myelomonocytic leukemia not having achieved remission: Secondary | ICD-10-CM

## 2019-11-16 DIAGNOSIS — N189 Chronic kidney disease, unspecified: Secondary | ICD-10-CM | POA: Diagnosis not present

## 2019-11-16 LAB — CBC WITH DIFFERENTIAL/PLATELET
Abs Immature Granulocytes: 0.04 10*3/uL (ref 0.00–0.07)
Basophils Absolute: 0.1 10*3/uL (ref 0.0–0.1)
Basophils Relative: 1 %
Eosinophils Absolute: 0.1 10*3/uL (ref 0.0–0.5)
Eosinophils Relative: 1 %
HCT: 39.2 % (ref 36.0–46.0)
Hemoglobin: 11.8 g/dL — ABNORMAL LOW (ref 12.0–15.0)
Immature Granulocytes: 0 %
Lymphocytes Relative: 7 %
Lymphs Abs: 0.8 10*3/uL (ref 0.7–4.0)
MCH: 28.8 pg (ref 26.0–34.0)
MCHC: 30.1 g/dL (ref 30.0–36.0)
MCV: 95.6 fL (ref 80.0–100.0)
Monocytes Absolute: 3.7 10*3/uL — ABNORMAL HIGH (ref 0.1–1.0)
Monocytes Relative: 33 %
Neutro Abs: 6.7 10*3/uL (ref 1.7–7.7)
Neutrophils Relative %: 58 %
Platelets: 115 10*3/uL — ABNORMAL LOW (ref 150–400)
RBC: 4.1 MIL/uL (ref 3.87–5.11)
RDW: 14.9 % (ref 11.5–15.5)
WBC: 11.3 10*3/uL — ABNORMAL HIGH (ref 4.0–10.5)
nRBC: 0 % (ref 0.0–0.2)

## 2019-11-16 LAB — COMPREHENSIVE METABOLIC PANEL
ALT: 15 U/L (ref 0–44)
AST: 13 U/L — ABNORMAL LOW (ref 15–41)
Albumin: 3.6 g/dL (ref 3.5–5.0)
Alkaline Phosphatase: 46 U/L (ref 38–126)
Anion gap: 9 (ref 5–15)
BUN: 38 mg/dL — ABNORMAL HIGH (ref 8–23)
CO2: 29 mmol/L (ref 22–32)
Calcium: 9 mg/dL (ref 8.9–10.3)
Chloride: 102 mmol/L (ref 98–111)
Creatinine, Ser: 1.49 mg/dL — ABNORMAL HIGH (ref 0.44–1.00)
GFR calc Af Amer: 39 mL/min — ABNORMAL LOW (ref >60)
GFR calc non Af Amer: 34 mL/min — ABNORMAL LOW (ref >60)
Glucose, Bld: 96 mg/dL (ref 70–99)
Potassium: 3.8 mmol/L (ref 3.5–5.1)
Sodium: 140 mmol/L (ref 135–145)
Total Bilirubin: 1 mg/dL (ref 0.3–1.2)
Total Protein: 8.1 g/dL (ref 6.5–8.1)

## 2019-11-16 LAB — LACTATE DEHYDROGENASE: LDH: 119 U/L (ref 98–192)

## 2020-01-25 ENCOUNTER — Encounter: Payer: Medicare (Managed Care) | Attending: Internal Medicine | Admitting: Internal Medicine

## 2020-01-25 ENCOUNTER — Other Ambulatory Visit: Payer: Self-pay

## 2020-01-25 DIAGNOSIS — L97228 Non-pressure chronic ulcer of left calf with other specified severity: Secondary | ICD-10-CM | POA: Insufficient documentation

## 2020-01-25 DIAGNOSIS — N183 Chronic kidney disease, stage 3 unspecified: Secondary | ICD-10-CM | POA: Insufficient documentation

## 2020-01-25 DIAGNOSIS — M329 Systemic lupus erythematosus, unspecified: Secondary | ICD-10-CM | POA: Diagnosis not present

## 2020-01-25 DIAGNOSIS — L97811 Non-pressure chronic ulcer of other part of right lower leg limited to breakdown of skin: Secondary | ICD-10-CM | POA: Insufficient documentation

## 2020-01-25 DIAGNOSIS — Z79899 Other long term (current) drug therapy: Secondary | ICD-10-CM | POA: Insufficient documentation

## 2020-01-25 DIAGNOSIS — I129 Hypertensive chronic kidney disease with stage 1 through stage 4 chronic kidney disease, or unspecified chronic kidney disease: Secondary | ICD-10-CM | POA: Diagnosis not present

## 2020-01-25 DIAGNOSIS — I890 Lymphedema, not elsewhere classified: Secondary | ICD-10-CM | POA: Diagnosis not present

## 2020-01-25 DIAGNOSIS — C9591 Leukemia, unspecified, in remission: Secondary | ICD-10-CM | POA: Insufficient documentation

## 2020-01-25 DIAGNOSIS — I2510 Atherosclerotic heart disease of native coronary artery without angina pectoris: Secondary | ICD-10-CM | POA: Insufficient documentation

## 2020-01-26 NOTE — Progress Notes (Signed)
KARRISSA, STREITZ ([id number]) Visit Report for 01/25/2020 Abuse/Suicide Risk Screen Details Patient Name: [name], [name] Date of Service: 01/25/2020 2:15 PM Medical Record Number: [id number] Patient Account Number: [id number] Date of Birth/Sex: [birth year] (79 y.o. F) Treating RN: Army Melia Primary Care Kioni Stahl: Salome Holmes Other Clinician: Referring Treylon Henard: Referral, Self Treating Endya Austin/Extender: Tito Dine in Treatment: 0 Abuse/Suicide Risk Screen Items Answer ABUSE RISK SCREEN: Has anyone close to you tried to hurt or harm you recentlyo No Do you feel uncomfortable with anyone in your familyo No Has anyone forced you do things that you didnot want to doo No Electronic Signature(s) Signed: 01/26/2020 9:44:09 AM By: Army Melia Entered By: Army Melia on 01/25/2020 14:21:11 [name], [name] ([id number]) -------------------------------------------------------------------------------- Activities of Daily Living Details Patient Name: [name] Date of Service: 01/25/2020 2:15 PM Medical Record Number: [id number] Patient Account Number: [id number] Date of Birth/Sex: 06-24-1941 (79 y.o. F) Treating RN: Army Melia Primary Care Deandre Brannan: Salome Holmes Other Clinician: Referring Evagelia Knack: Referral, Self Treating Kdyn Vonbehren/Extender: Tito Dine in Treatment: 0 Activities of Daily Living Items Answer Activities of Daily Living (Please select one for each item) Drive Automobile Not Able Take Medications Completely Able Use Telephone Completely Able Care for Appearance Completely Able Use Toilet Completely Able Bath / Shower Completely Able Dress Self Completely Able Feed Self Completely Able Walk Completely Able Get In / Out Bed Completely Able Housework Completely Able Prepare Meals Completely [place] for Self Completely Able Electronic Signature(s) Signed: 01/26/2020 9:44:09 AM By: Army Melia Entered By:  Army Melia on 01/25/2020 14:21:28 [name], [name] ([id number]) -------------------------------------------------------------------------------- Education Screening Details Patient Name: [name] Date of Service: 01/25/2020 2:15 PM Medical Record Number: [id number] Patient Account Number: [id number] Date of Birth/Sex: [birth year] (79 y.o. F) Treating RN: Army Melia Primary Care Cortnee Steinmiller: Salome Holmes Other Clinician: Referring Tagg Eustice: Referral, Self Treating Brittiny Levitz/Extender: Tito Dine in Treatment: 0 Primary Learner Assessed: Patient Learning Preferences/Education Level/Primary Language Learning Preference: Explanation Highest Education Level: High School Preferred Language: English Cognitive Barrier Language Barrier: No Translator Needed: No Memory Deficit: No Emotional Barrier: No Cultural/Religious Beliefs Affecting Medical Care: No Physical Barrier Impaired Vision: No Impaired Hearing: No Decreased Hand dexterity: No Knowledge/Comprehension Knowledge Level: High Comprehension Level: High Ability to understand written instructions: High Ability to understand verbal instructions: High Motivation Anxiety Level: Calm Cooperation: Cooperative Education Importance: Acknowledges Need Interest in Health Problems: Asks Questions Perception: Coherent Willingness to Engage in Self-Management High Activities: Readiness to Engage in Self-Management High Activities: Electronic Signature(s) Signed: 01/26/2020 9:44:09 AM By: Army Melia Entered By: Army Melia on 01/25/2020 14:21:42 [name], [name] ([id number]) -------------------------------------------------------------------------------- Fall Risk Assessment Details Patient Name: [name] Date of Service: 01/25/2020 2:15 PM Medical Record Number: [id number] Patient Account Number: [id number] Date of Birth/Sex: [birth year] (79 y.o. F) Treating RN: Army Melia Primary Care Khyler Eschmann: Salome Holmes Other Clinician: Referring Zaiyah Sottile: Referral, Self Treating Alaisha Eversley/Extender: Tito Dine in Treatment: 0 Fall Risk Assessment Items Have you had 2 or more falls in the last 12 monthso 0 No Have you had any fall that resulted in injury in the last 12 monthso 0 No FALLS RISK SCREEN History of falling - immediate or within 3 months 0 No Secondary diagnosis (Do you have 2 or more medical diagnoseso) 0 No Ambulatory aid None/bed rest/wheelchair/nurse 0 No Crutches/cane/walker 0 No Furniture 0 No Intravenous therapy Access/Saline/Heparin Lock 0 No Gait/Transferring Normal/ bed rest/ wheelchair 0 No Weak (short steps with  or without shuffle, stooped but able to lift head while walking, may seek 0 No support from furniture) Impaired (short steps with shuffle, may have difficulty arising from chair, head down, impaired 0 No balance) Mental Status Oriented to own ability 0 No Electronic Signature(s) Signed: 01/26/2020 9:44:09 AM By: Army Melia Entered By: Army Melia on 01/25/2020 14:21:47 [name], [name] ([id number]) -------------------------------------------------------------------------------- Foot Assessment Details Patient Name: [name] Date of Service: 01/25/2020 2:15 PM Medical Record Number: [id number] Patient Account Number: [id number] Date of Birth/Sex: July 31, 1941 (79 y.o. F) Treating RN: Army Melia Primary Care Hoby Kawai: Salome Holmes Other Clinician: Referring Gar Glance: Referral, Self Treating Forrest Jaroszewski/Extender: Tito Dine in Treatment: 0 Foot Assessment Items Site Locations + = Sensation present, - = Sensation absent, C = Callus, U = Ulcer R = Redness, W = Warmth, M = Maceration, PU = Pre-ulcerative lesion F = Fissure, S = Swelling, D = Dryness Assessment Right: Left: Other Deformity: No No Prior Foot Ulcer: No No Prior Amputation: No No Charcot Joint: No No Ambulatory Status: Ambulatory Without Help Gait:  Steady Electronic Signature(s) Signed: 01/26/2020 9:44:09 AM By: Army Melia Entered By: Army Melia on 01/25/2020 14:22:42 [name], [name] ([id number]) -------------------------------------------------------------------------------- Nutrition Risk Screening Details Patient Name: [name] Date of Service: 01/25/2020 2:15 PM Medical Record Number: [id number] Patient Account Number: [id number] Date of Birth/Sex: 1940/10/15 (78 y.o. F) Treating RN: Army Melia Primary Care Lyndsi Altic: Salome Holmes Other Clinician: Referring Anabel Lykins: Referral, Self Treating Ibrahem Volkman/Extender: Tito Dine in Treatment: 0 Height (in): 65 Weight (lbs): 140 Body Mass Index (BMI): 23.3 Nutrition Risk Screening Items Score Screening NUTRITION RISK SCREEN: I have an illness or condition that made me change the kind and/or amount of food I eat 0 No I eat fewer than two meals per day 0 No I eat few fruits and vegetables, or milk products 0 No I have three or more drinks of beer, liquor or wine almost every day 0 No I have tooth or mouth problems that make it hard for me to eat 0 No I don't always have enough money to buy the food I need 0 No I eat alone most of the time 0 No I take three or more different prescribed or over-the-counter drugs a day 0 No Without wanting to, I have lost or gained 10 pounds in the last six months 0 No I am not always physically able to shop, cook and/or feed myself 0 No Nutrition Protocols Good Risk Protocol 0 No interventions needed Moderate Risk Protocol High Risk Proctocol Risk Level: Good Risk Score: 0 Electronic Signature(s) Signed: 01/26/2020 9:44:09 AM By: Army Melia Entered By: Army Melia on 01/25/2020 14:21:52

## 2020-01-26 NOTE — Progress Notes (Signed)
ETANA, MELCHERT ([id number]) Visit Report for 01/25/2020 Chief Complaint Document Details Patient Name: [name], [name] Date of Service: 01/25/2020 2:15 PM Medical Record Number: [id number] Patient Account Number: [id number] Date of Birth/Sex: November 02, 1940 (78 y.o. F) Treating RN: Cornell Barman Primary Care Provider: Salome Holmes Other Clinician: Referring Provider: Referral, Self Treating Provider/Extender: Tito Dine in Treatment: 0 Information Obtained from: Patient Chief Complaint Bilateral LE Blisters/Ulcers 01/25/2020; patient returns to clinic with a massive blister on the right lower leg laterally and anteriorly and a smaller area on the left posterior calf Electronic Signature(s) Signed: 01/25/2020 4:08:23 PM By: Linton Ham MD Entered By: Linton Ham on 01/25/2020 15:28:37 Ells, [name] ([id number]) -------------------------------------------------------------------------------- Debridement Details Patient Name: [name] Date of Service: 01/25/2020 2:15 PM Medical Record Number: [id number] Patient Account Number: [id number] Date of Birth/Sex: 12-Feb-1941 (78 y.o. F) Treating RN: Cornell Barman Primary Care Provider: Salome Holmes Other Clinician: Referring Provider: Referral, Self Treating Provider/Extender: Tito Dine in Treatment: 0 Debridement Performed for Wound #4 Right,Anterior Lower Leg Assessment: Performed By: Physician Ricard Dillon, MD Debridement Type: Debridement Severity of Tissue Pre Debridement: Limited to breakdown of skin Level of Consciousness (Pre- Awake and Alert procedure): Pre-procedure Verification/Time Out Yes - 14:55 Taken: Start Time: 14:55 Pain Control: Lidocaine Total Area Debrided (L x W): 8 (cm) x 12 (cm) = 96 (cm) Tissue and other material debrided: Non-Viable, Skin: Dermis Level: Skin/Dermis Debridement Description: Selective/Open Wound Instrument: Blade, Scissors Bleeding: None Response to  Treatment: Procedure was tolerated well Level of Consciousness (Post- Awake and Alert procedure): Post Debridement Measurements of Total Wound Length: (cm) 16 Width: (cm) 25 Depth: (cm) 0.1 Volume: (cm) 31.416 Character of Wound/Ulcer Post Debridement: Stable Severity of Tissue Post Debridement: Limited to breakdown of skin Post Procedure Diagnosis Same as Pre-procedure Electronic Signature(s) Signed: 01/25/2020 4:08:23 PM By: Linton Ham MD Signed: 01/25/2020 4:30:28 PM By: Gretta Cool, BSN, RN, CWS, Kim RN, BSN Entered By: Gretta Cool, BSN, RN, CWS, Kim on 01/25/2020 14:57:17 Kalbfleisch, [name] ([id number]) -------------------------------------------------------------------------------- HPI Details Patient Name: [name] Date of Service: 01/25/2020 2:15 PM Medical Record Number: [id number] Patient Account Number: [id number] Date of Birth/Sex: [birth year] (78 y.o. F) Treating RN: Cornell Barman Primary Care Provider: Salome Holmes Other Clinician: Referring Provider: Referral, Self Treating Provider/Extender: Tito Dine in Treatment: 0 History of Present Illness HPI Description: 03/05/18 on evaluation today patient presents for initial evaluation and our clinic due to significantly large blisters of the bilateral lower extremities which began initially on 02/18/18. 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 Secondarily it does appear that they felt she likely had cellulitis. Subsequently the patient was seen for fault evaluation on 03/03/18 and that is really when the cellulitis was diagnosed. She was actually placed on doxycycline 100 mg by mouth twice a day for 10 days. With that being said she was prescribed prednisone although she did not tolerate this. Apparently she had some alteration in her mental status with taking this. The patient does have a history of leukemia which is  fortunately in remission at this point it appears. She has never smoked and unfortunately does have pain at the site where these blisters are located of the bilateral lower extremities. She also does appear to have significant lymphedema which obviously has been more chronic. 03/12/18 on evaluation today patient actually appears to be doing much better in regard to her bilateral  [place] ulcers. Unfortunately a lot of the alginate dressing did stick to her legs due to the fact that everything dried up rather rapidly in the compression seems to have done very well in that regard. Unfortunately the home health nurse last time she came out to the patient's home was not able to fully remove the alginate from her leg so she just rewrapped it with what was there continuing to be in place. Obviously that does need to come off but overall I'm extremely pleased with the progress it's been made over the past week which is the Kerlex and Coban wraps. 03/18/18-She is here in follow-up evaluation for bilateral lower extremity lymphedema with blister/ulceration. She is essentially healed to the right lower extremity with one residual pinpoint opening, the left lower extremity has multiple pinpoint openings with minimal amount of serous drainage. We will continue with compression over this next week. We have requested that home help initiate the process of ordering compression wraps for long-term management. She will follow-up next week, I anticipate she will be healed at that appointment 03/26/18 on evaluation today patient appears to be doing extremely well in regard to her bilateral lower extremity ulcers. This has completely resolved for the most part on the left lower extremity and has completely resolved in regard to the right lower extremity. She's been tolerating the dressing changes without complication including the wraps. Overall her swelling is down significantly and I think this accounts for why she is  doing so much better at this time. Overall she is very pleased and having very little discomfort compared to previous. 04/02/18 on evaluation today patient actually appears to be completely healed which is excellent news. She has been tolerating the wraps without complication obviously this has done very well for her. In general she shows no signs of infection or otherwise complicating factors. She's having no pain. Readmission: 06/02/2019 on evaluation today patient appears to be unfortunately experiencing issues with increased swelling of her bilateral lower extremities. I have last seen her in June 2019. At that time everything healed and it does appear that we actually got her some Velcro compression at that time. Nonetheless she states she was wearing that until about 5 weeks ago and then subsequently she quit wearing it she tells me the only excuse that she can give is "laziness". Nonetheless she is not able to get any compression sleeves/stockings on. she tells me currently that she is not experiencing any evidence of infection at this time she is been having some leaking intermittently from her lower extremities right now the left on the lateral portion is the only area open. No fevers, chills, nausea, vomiting, or diarrhea. 06/09/2019 on evaluation today patient actually appears to be doing quite well with regard to her leg ulcers in fact everything appears to be completely closed at this time bilaterally. There is no signs of active infection at this time which is good news. Overall I am very pleased. She did bring her Velcro compression wraps with her today. READMISSION 01/25/2020 This is a now 79 year old woman who is been in this clinic for a prolonged period of time in 2019 and more recently in 2020 with bilateral lower extremity blisters and wounds in the setting of chronic lymphedema. She is generally fairly noncompliant with compression. When she was here in 2020 she was discharged  with bilateral zippered stockings but she tells as these stopped working and she has not been wearing anything for a long period of time. She  came in today after developing a substantial superficial blister on the right anterior and lateral lower leg which she said started on Sunday. She has not had any pain. As noted she is not wearing compression stockings. Past medical history includes chronic myelocytic leukemia presumably in remission although I did not really see anything about this on [place] link, coronary artery disease, hypertension, lupus on hydroxychloroquine, stage III chronic renal failure with an estimated GFR of 39 based on lab work in February of this year. We did not do her ABIs today although most recently in 2020 these were 0.84 and 0.82 right and left respectively MERLYNN, WILKOWSKI ([id number]) Electronic Signature(s) Signed: 01/25/2020 4:08:23 PM By: Linton Ham MD Entered By: Linton Ham on 01/25/2020 15:32:51 Baldinger, [name] ([id number]) -------------------------------------------------------------------------------- Physical Exam Details Patient Name: [name] Date of Service: 01/25/2020 2:15 PM Medical Record Number: [id number] Patient Account Number: [id number] Date of Birth/Sex: 06-13-1941 (78 y.o. F) Treating RN: Cornell Barman Primary Care Provider: Salome Holmes Other Clinician: Referring Provider: Referral, Self Treating Provider/Extender: Tito Dine in Treatment: 0 Constitutional Patient is hypertensive.. Pulse regular and within target range for patient.Marland Kitchen Respirations regular, non-labored and within target range.. Temperature is normal and within the target range for the patient.Marland Kitchen appears in no distress. Respiratory Respiratory effort is easy and symmetric bilaterally. Rate is normal at rest and on room air.. Very shallow air entry bilaterally.. Cardiovascular 2 out of 6 pansystolic murmur at the PMI. Jugular venous pressure was not  elevated there was no sacral edema no S3. Pedal pulses are palpable. Edema present in both extremities. Poorly controlled lymphedema which is nonpitting.. Integumentary (Hair, Skin) Severe bilateral hemosiderin deposition with skin changes secondary to poorly controlled lymphedema. Notes Wound examination; patient has a massive blister on her right anterior and lateral lower extremity flaccid. This was opened with a #15 blade copious amounts of fluid drained. She is left with a fairly substantial albeit superficial wound on the right anterior lateral lower leg. oOn the left posterior she has a necrotic surface the wound I did not debride this today. Fortunately no evidence of infection bilateral Electronic Signature(s) Signed: 01/25/2020 4:08:23 PM By: Linton Ham MD Entered By: Linton Ham on 01/25/2020 15:36:01 Lex, [name] ([id number]) -------------------------------------------------------------------------------- Physician Orders Details Patient Name: [name] Date of Service: 01/25/2020 2:15 PM Medical Record Number: [id number] Patient Account Number: [id number] Date of Birth/Sex: 1941/03/31 (78 y.o. F) Treating RN: Cornell Barman Primary Care Provider: Salome Holmes Other Clinician: Referring Provider: Referral, Self Treating Provider/Extender: Tito Dine in Treatment: 0 Verbal / Phone Orders: No Diagnosis Coding Wound Cleansing Wound #4 Right,Anterior Lower Leg o Clean wound with Normal Saline. Wound #5 Left,Posterior Lower Leg o Clean wound with Normal Saline. Skin Barriers/Peri-Wound Care Wound #5 Left,Posterior Lower Leg o Barrier cream Primary Wound Dressing Wound #4 Right,Anterior Lower Leg o Silver Alginate Wound #5 Left,Posterior Lower Leg o Silver Alginate Secondary Dressing Wound #4 Right,Anterior Lower Leg o XtraSorb Wound #5 Left,Posterior Lower Leg o ABD pad Dressing Change Frequency Wound #4 Right,Anterior Lower  Leg o Change Dressing Monday, Wednesday, Friday - Monday and Friday (Homehealth) Wednesday in clinic Wound #5 Left,Posterior Lower Leg o Change Dressing Monday, Wednesday, Friday - Monday and Friday (Homehealth) Wednesday in clinic Follow-up Appointments Wound #4 Right,Anterior Lower Leg o Return Appointment in 1 week. o Nurse Visit as needed Wound #5 Left,Posterior Lower Leg o Return Appointment in 1 week. o Nurse Visit as needed Edema Control Wound #4 Right,Anterior  Lower Leg o 3 Layer Compression System - Bilateral o Elevate legs to the level of the heart and pump ankles as often as possible Wound #5 Left,Posterior Lower Leg o 3 Layer Compression System - Bilateral o Elevate legs to the level of the heart and pump ankles as often as possible Home Health Wound #4 Right,Anterior Lower Leg SHAVONIA, STOECKLEIN ([id number]) o [place] for [place] Nurse may visit PRN to address patientos wound care needs. o FACE TO FACE ENCOUNTER: MEDICARE and MEDICAID PATIENTS: I certify that this patient is under my care and that I had a face-to- face encounter that meets the physician face-to-face encounter requirements with this patient on this date. The encounter with the patient was in whole or in part for the following MEDICAL CONDITION: (primary reason for [place]) MEDICAL NECESSITY: I certify, that based on my findings, NURSING services are a medically necessary home health service. HOME BOUND STATUS: I certify that my clinical findings support that this patient is homebound (i.e., Due to illness or injury, pt requires aid of supportive devices such as crutches, cane, wheelchairs, walkers, the use of special transportation or the assistance of another person to leave their place of residence. There is a normal inability to leave the home and doing so requires considerable and taxing effort. Other absences are for medical reasons /  religious services and are infrequent or of short duration when for other reasons). o If current dressing causes regression in wound condition, may D/C ordered dressing product/s and apply Normal Saline Moist Dressing daily until next [place] / Other MD appointment. [place] of regression in wound condition at [phone]. o Please direct any NON-WOUND related issues/requests for orders to patient's Primary Care Physician Wound #5 [place] for [place] Nurse may visit PRN to address patientos wound care needs. o FACE TO FACE ENCOUNTER: MEDICARE and MEDICAID PATIENTS: I certify that this patient is under my care and that I had a face-to- face encounter that meets the physician face-to-face encounter requirements with this patient on this date. The encounter with the patient was in whole or in part for the following MEDICAL CONDITION: (primary reason for [place]) MEDICAL NECESSITY: I certify, that based on my findings, NURSING services are a medically necessary home health service. HOME BOUND STATUS: I certify that my clinical findings support that this patient is homebound (i.e., Due to illness or injury, pt requires aid of supportive devices such as crutches, cane, wheelchairs, walkers, the use of special transportation or the assistance of another person to leave their place of residence. There is a normal inability to leave the home and doing so requires considerable and taxing effort. Other absences are for medical reasons / religious services and are infrequent or of short duration when for other reasons). o If current dressing causes regression in wound condition, may D/C ordered dressing product/s and apply Normal Saline Moist Dressing daily until next [place] / Other MD appointment. [place] of regression in wound condition at [phone]. o  Please direct any NON-WOUND related issues/requests for orders to patient's Primary Care Physician Electronic Signature(s) Signed: 01/25/2020 4:08:23 PM By: Linton Ham MD Signed: 01/25/2020 4:30:28 PM By: Gretta Cool, BSN, RN, CWS, Kim RN, BSN Entered By: Gretta Cool, BSN, RN, CWS, Kim on 01/25/2020 15:05:46 Stimson, [name] ([id number]) -------------------------------------------------------------------------------- Problem List Details Patient Name: [name] Date of Service: 01/25/2020  2:15 PM Medical Record Number: [id number] Patient Account Number: [id number] Date of Birth/Sex: 1940-10-19 (79 y.o. 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 limited to 01/25/2020 No Yes breakdown of skin L97.228 Non-pressure chronic ulcer of left calf with other specified severity 01/25/2020 No Yes I89.0 Lymphedema, not elsewhere classified 01/25/2020 No Yes Inactive Problems Resolved Problems Electronic Signature(s) Signed: 01/25/2020 4:08:23 PM By: Linton Ham MD Entered By: Linton Ham on 01/25/2020 15:23:42 Misner, [name] ([id number]) -------------------------------------------------------------------------------- Progress Note Details Patient Name: [name] Date of Service: 01/25/2020 2:15 PM Medical Record Number: [id number] Patient Account Number: [id number] Date of Birth/Sex: [birth year] (78 y.o. F) Treating RN: Cornell Barman Primary Care Provider: Salome Holmes Other Clinician: Referring Provider: Referral, Self Treating Provider/Extender: Tito Dine in Treatment: 0 Subjective Chief Complaint Information obtained from Patient Bilateral LE Blisters/Ulcers 01/25/2020; patient  returns to clinic with a massive blister on the right lower leg laterally and anteriorly and a smaller area on the left posterior calf History of Present Illness (HPI) 03/05/18 on evaluation today patient presents for initial evaluation and our clinic due to significantly large blisters of the bilateral lower extremities which began initially on 02/18/18. 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 Secondarily it does appear that they felt she likely had cellulitis. Subsequently the patient was seen for fault evaluation on 03/03/18 and that is really when the cellulitis was diagnosed. She was actually placed on doxycycline 100 mg by mouth twice a day for 10 days. With that being said she was prescribed prednisone although she did not tolerate this. Apparently she had some alteration in her mental status with taking this. 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 She has never smoked and unfortunately does have pain at the site where these blisters are located of the bilateral lower extremities. She also does appear to have significant lymphedema which obviously has been more chronic. 03/12/18 on evaluation today patient actually appears to be doing much better in regard to her bilateral [place] ulcers. Unfortunately a lot of the alginate dressing did stick to her legs due to the fact that everything dried up rather rapidly in the compression seems to have done very well in that regard. Unfortunately the home health nurse last time she came out to the patient's home was not able to fully remove the alginate from her leg so she just rewrapped it with what was there continuing to be in place. Obviously that does need to come off but overall I'm extremely pleased with the progress it's been made over the past week which is the Kerlex and Coban  wraps. 03/18/18-She is here in follow-up evaluation for bilateral lower extremity lymphedema with blister/ulceration. She is essentially healed to the right lower extremity with one residual pinpoint opening, the left lower extremity has multiple pinpoint openings with minimal amount of serous drainage. We will continue with compression over this next week. We have requested that home help initiate the process of ordering compression wraps for long-term management. She will follow-up next week, I anticipate she will be healed at that appointment 03/26/18 on evaluation today patient appears  to be doing extremely well in regard to her bilateral lower extremity ulcers. This has completely resolved for the most part on the left lower extremity and has completely resolved in regard to the right lower extremity. She's been tolerating the dressing changes without complication including the wraps. Overall her swelling is down significantly and I think this accounts for why she is doing so much better at this time. Overall she is very pleased and having very little discomfort compared to previous. 04/02/18 on evaluation today patient actually appears to be completely healed which is excellent news. She has been tolerating the wraps without complication obviously this has done very well for her. In general she shows no signs of infection or otherwise complicating factors. She's having no pain. Readmission: 06/02/2019 on evaluation today patient appears to be unfortunately experiencing issues with increased swelling of her bilateral lower extremities. I have last seen her in June 2019. At that time everything healed and it does appear that we actually got her some Velcro compression at that time. Nonetheless she states she was wearing that until about 5 weeks ago and then subsequently she quit wearing it she tells me the only excuse that she can give is "laziness". Nonetheless she is not able to get any compression  sleeves/stockings on. she tells me currently that she is not experiencing any evidence of infection at this time she is been having some leaking intermittently from her lower extremities right now the left on the lateral portion is the only area open. No fevers, chills, nausea, vomiting, or diarrhea. 06/09/2019 on evaluation today patient actually appears to be doing quite well with regard to her leg ulcers in fact everything appears to be completely closed at this time bilaterally. There is no signs of active infection at this time which is good news. Overall I am very pleased. She did bring her Velcro compression wraps with her today. READMISSION 01/25/2020 This is a now 79 year old woman who is been in this clinic for a prolonged period of time in 2019 and more recently in 2020 with bilateral lower extremity blisters and wounds in the setting of chronic lymphedema. She is generally fairly noncompliant with compression. When she was here in 2020 she was discharged with bilateral zippered stockings but she tells as these stopped working and she has not been wearing anything for a long period of time. She came in today after developing a substantial superficial blister on the right anterior and lateral lower leg which she said started on Sunday. She has not had any pain. As noted she is not wearing compression stockings. Past medical history includes chronic myelocytic leukemia presumably in remission although I did not really see anything about this on [place], Larin M. ([id number]) link, coronary artery disease, hypertension, lupus on hydroxychloroquine, stage III chronic renal failure with an estimated GFR of 39 based on lab work in February of this year. We did not do her ABIs today although most recently in 2020 these were 0.84 and 0.82 right and left respectively Patient History Information obtained from Patient. Allergies No Known Drug Allergies Family History Cancer - Mother,  Diabetes - Siblings, Hypertension - Siblings, Lung Disease - Siblings, No family history of Heart Disease, Hereditary Spherocytosis, Kidney Disease, Seizures, Stroke, Thyroid Problems, Tuberculosis. Social History Never smoker, Marital Status - Widowed, Alcohol Use - Never, Drug Use - No History, Caffeine Use - Never. Medical History Eyes Denies history of Cataracts, Glaucoma, Optic Neuritis Ear/Nose/Mouth/Throat Patient has history of Chronic sinus  problems/congestion - allergys seasonal Denies history of Middle ear problems Hematologic/Lymphatic Patient has history of Anemia, Lymphedema Denies history of Hemophilia, Human Immunodeficiency Virus, Sickle Cell Disease Respiratory Denies history of Aspiration, Asthma, Chronic Obstructive Pulmonary Disease (COPD), Pneumothorax, Sleep Apnea, Tuberculosis Cardiovascular Patient has history of Angina, Arrhythmia - murmur, Hypertension Denies history of Congestive Heart Failure, Coronary Artery Disease, Deep Vein Thrombosis, Hypotension, Myocardial Infarction, Peripheral Arterial Disease, Peripheral Venous Disease, Phlebitis, Vasculitis Gastrointestinal Denies history of Cirrhosis , Colitis, Crohn s, Hepatitis A, Hepatitis B, Hepatitis C Endocrine Denies history of Type I Diabetes, Type II Diabetes Genitourinary Denies history of End Stage Renal Disease Immunological Denies history of Lupus Erythematosus, Raynaud s, Scleroderma Integumentary (Skin) Denies history of History of Burn, History of pressure wounds Musculoskeletal Patient has history of Gout, Rheumatoid Arthritis, Osteoarthritis Denies history of Osteomyelitis Neurologic Denies history of Dementia, Neuropathy, Quadriplegia, Paraplegia, Seizure Disorder Oncologic Denies history of Received Chemotherapy, Received Radiation Psychiatric Denies history of Anorexia/bulimia, Confinement Anxiety Review of Systems (ROS) Constitutional Symptoms (General Health) Denies complaints or  symptoms of Fatigue, Fever, Chills, Marked Weight Change. Eyes Denies complaints or symptoms of Dry Eyes, Vision Changes, Glasses / Contacts. Ear/Nose/Mouth/Throat Denies complaints or symptoms of Difficult clearing ears, Sinusitis. Hematologic/Lymphatic Denies complaints or symptoms of Bleeding / Clotting Disorders, Human Immunodeficiency Virus. Respiratory Denies complaints or symptoms of Chronic or frequent coughs, Shortness of Breath. Cardiovascular Denies complaints or symptoms of Chest pain. Gastrointestinal Denies complaints or symptoms of Frequent diarrhea, Nausea, Vomiting. Endocrine Denies complaints or symptoms of Hepatitis, Thyroid disease, Polydypsia (Excessive Thirst). JAILY, FEATHERSTON ([id number]) Genitourinary Denies complaints or symptoms of Kidney failure/ Dialysis, Incontinence/dribbling. Immunological Denies complaints or symptoms of Hives, Itching. Musculoskeletal Denies complaints or symptoms of Muscle Pain, Muscle Weakness. Neurologic Denies complaints or symptoms of Numbness/parasthesias, Focal/Weakness. Psychiatric Denies complaints or symptoms of Anxiety, Claustrophobia. Objective Constitutional Patient is hypertensive.. Pulse regular and within target range for patient.Marland Kitchen Respirations regular, non-labored and within target range.. Temperature is normal and within the target range for the patient.Marland Kitchen appears in no distress. Vitals Time Taken: 2:05 PM, Height: 65 in, Source: Stated, Weight: 140 lbs, Source: Measured, BMI: 23.3, Temperature: 98.4 F, Pulse: 64 bpm, Respiratory Rate: 18 breaths/min, Blood Pressure: 172/64 mmHg. Respiratory Respiratory effort is easy and symmetric bilaterally. Rate is normal at rest and on room air.. Very shallow air entry bilaterally.. Cardiovascular 2 out of 6 pansystolic murmur at the PMI. Jugular venous pressure was not elevated there was no sacral edema no S3. Pedal pulses are palpable. Edema present in both extremities.  Poorly controlled lymphedema which is nonpitting.. General Notes: Wound examination; patient has a massive blister on her right anterior and lateral lower extremity flaccid. This was opened with a #15 blade copious amounts of fluid drained. She is left with a fairly substantial albeit superficial wound on the right anterior lateral lower leg. On the left posterior she has a necrotic surface the wound I did not debride this today. Fortunately no evidence of infection bilateral Integumentary (Hair, Skin) Severe bilateral hemosiderin deposition with skin changes secondary to poorly controlled lymphedema. Wound #4 status is Open. Original cause of wound was Blister. The wound is located on the Right,Anterior Lower Leg. The wound measures 16cm length x 25cm width x 0.1cm depth; 314.159cm^2 area and 31.416cm^3 volume. There is Fat Layer (Subcutaneous Tissue) Exposed exposed. There is no tunneling or undermining noted. There is a medium amount of serous drainage noted. There is no granulation within the wound bed. There is no necrotic tissue within  the wound bed. Wound #5 status is Open. Original cause of wound was Gradually Appeared. The wound is located on the Left,Posterior Lower Leg. The wound measures 3.5cm length x 2.5cm width x 0.1cm depth; 6.872cm^2 area and 0.687cm^3 volume. There is Fat Layer (Subcutaneous Tissue) Exposed exposed. There is no tunneling or undermining noted. There is a medium amount of serosanguineous drainage noted. There is small (1-33%) red granulation within the wound bed. There is a large (67-100%) amount of necrotic tissue within the wound bed including Adherent Slough. 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 There was a Selective/Open Wound Skin/Dermis Debridement with a total area of 96 sq cm performed by Ricard Dillon, MD. With the following instrument(s): Blade, and Scissors to remove Non-Viable tissue/material. Material removed includes Skin: Dermis after achieving pain control using Lidocaine. No specimens were taken. A time out was conducted at 14:55, prior to the start of the procedure. There was no bleeding. The procedure was tolerated well. Post Debridement Measurements: 16cm length x 25cm width x 0.1cm depth; 31.416cm^3 volume. Character of Wound/Ulcer Post Debridement is stable. Severity of Tissue Post Debridement is: Limited to breakdown of skin. Post procedure Diagnosis Wound #4: Same as Pre-Procedure Plan Wound Cleansing: Wound #4 Right,Anterior Lower Leg: Clean wound with Normal Saline. Wound #5 Left,Posterior Lower Leg: Clean wound with Normal Saline. Skin Barriers/Peri-Wound Care: Wound #5 Left,Posterior Lower Leg: Barrier cream Primary Wound Dressing: Wound #4 Right,Anterior Lower Leg: Silver Alginate Wound #5 Left,Posterior Lower Leg: Silver Alginate Secondary Dressing: Wound #4 Right,Anterior Lower Leg: XtraSorb Wound #5 Left,Posterior Lower Leg: ABD pad Dressing Change Frequency: Wound #4 Right,Anterior Lower Leg: Change Dressing Monday, Wednesday, Friday - Monday and Friday (Homehealth) Wednesday in clinic Wound #5 Left,Posterior Lower Leg: Change Dressing Monday, Wednesday, Friday - Monday and Friday (Homehealth) Wednesday in clinic Follow-up Appointments: Wound #4 Right,Anterior Lower Leg: Return Appointment in 1 week. Nurse Visit as needed Wound #5 Left,Posterior Lower Leg: Return Appointment in 1 week. Nurse Visit as needed Edema Control: Wound #4 Right,Anterior Lower Leg: 3 Layer Compression System -  Bilateral Elevate legs to the level of the heart and pump ankles as often as possible Wound #5 Left,Posterior Lower Leg: 3 Layer Compression System - Bilateral Elevate legs to the level of the heart and pump ankles as often as possible Home Health: Wound #4 Right,Anterior Lower Leg: [place] for [place] Nurse may visit PRN to address patient s wound care needs. FACE TO FACE ENCOUNTER: MEDICARE and MEDICAID PATIENTS: I certify that this patient is under my care and that I had a face-to-face encounter that meets the physician face-to-face encounter requirements with this patient on this date. The encounter with the patient was in whole or in part for the following MEDICAL CONDITION: (primary reason for [place]) MEDICAL NECESSITY: I certify, that based on my findings, NURSING services are a medically necessary home health service. HOME BOUND STATUS: I certify that my clinical findings support that this patient is homebound (i.e., Due to illness or injury, pt requires aid of supportive devices such as crutches, cane, wheelchairs, walkers, the use of special transportation or the assistance of  another person to leave their place of residence. There is a normal inability to leave the home and doing so requires considerable and taxing effort. Other absences are for medical reasons / religious services and are infrequent or of short duration when for other reasons). If current dressing causes regression in wound condition, may D/C ordered dressing product/s and apply Normal Saline Moist Dressing daily until next [place] / Other MD appointment. [place] of regression in wound condition at [phone]. Please direct any NON-WOUND related issues/requests for orders to patient's Primary Care Physician Wound #5 Left,Posterior Lower Leg: DAMARIA, NAVALTA ([id number]) [place] for [place] Nurse may visit  PRN to address patient s wound care needs. FACE TO FACE ENCOUNTER: MEDICARE and MEDICAID PATIENTS: I certify that this patient is under my care and that I had a face-to-face encounter that meets the physician face-to-face encounter requirements with this patient on this date. The encounter with the patient was in whole or in part for the following MEDICAL CONDITION: (primary reason for [place]) MEDICAL NECESSITY: I certify, that based on my findings, NURSING services are a medically necessary home health service. HOME BOUND STATUS: I certify that my clinical findings support that this patient is homebound (i.e., Due to illness or injury, pt requires aid of supportive devices such as crutches, cane, wheelchairs, walkers, the use of special transportation or the assistance of another person to leave their place of residence. There is a normal inability to leave the home and doing so requires considerable and taxing effort. Other absences are for medical reasons / religious services and are infrequent or of short duration when for other reasons). If current dressing causes regression in wound condition, may D/C ordered dressing product/s and apply Normal Saline Moist Dressing daily until next [place] / Other MD appointment. [place] of regression in wound condition at [phone]. Please direct any NON-WOUND related issues/requests for orders to patient's Primary Care Physician 1. The patient has severe bilateral lymphedema. No doubt the blister was related to this although the size was somewhat shocking. This was flaccid there was no way to save this epithelium. The area was opened the fluid drained the tissue removed. She has a large albeit superficial wound remaining 2. There is a necrotic area on the left posterior calf. I did not debride this today I would like to have a better look at this next week 3 silver alginate/Xtrasorb/ABDs under 3 layer compression  which she has tolerated previously. She is used to this. 4. I do not see arterial studies on her although her feet were warm peripheral pulses were palpable I do not have any concerns about the 3 layer compression 5. The patient does not have significant cardiac disease at the bedside. Echo from 2016 was fairly good in terms of her ejection fraction. No significant valvular disease. She does have stage III chronic renal failure and she is on chlorthalidone and ethacrynic acid presumably a severe sulfa allergy I spent 30 minutes in review of this patient's past medical history is, face-to-face evaluation and preparation of this record. Electronic Signature(s) Signed: 01/25/2020 4:08:23 PM By: Linton Ham MD Entered By: Linton Ham on 01/25/2020 15:39:57 [place], [name] ([id number]) -------------------------------------------------------------------------------- ROS/PFSH Details Patient Name: [name] Date of Service: 01/25/2020 2:15 PM Medical Record Number: [id number] Patient Account Number: [id number] Date of Birth/Sex: Mar 07, 1941 (78 y.o. F) Treating RN: Army Melia Primary Care Provider: Salome Holmes Other Clinician: Referring  Provider: Referral, Self Treating Provider/Extender: Ricard Dillon Weeks in Treatment: 0 Information Obtained From Patient Constitutional Symptoms (General Health) Complaints and Symptoms: Negative for: Fatigue; Fever; Chills; Marked Weight Change Eyes Complaints and Symptoms: Negative for: Dry Eyes; Vision Changes; Glasses / Contacts Medical History: Negative for: Cataracts; Glaucoma; Optic Neuritis Ear/Nose/Mouth/Throat Complaints and Symptoms: Negative for: Difficult clearing ears; Sinusitis Medical History: Positive for: Chronic sinus problems/congestion - allergys seasonal Negative for: Middle ear problems Hematologic/Lymphatic Complaints and Symptoms: Negative for: Bleeding / Clotting Disorders; Human Immunodeficiency  Virus Medical History: Positive for: Anemia; Lymphedema Negative for: Hemophilia; Human Immunodeficiency Virus; Sickle Cell Disease Respiratory Complaints and Symptoms: Negative for: Chronic or frequent coughs; Shortness of Breath Medical History: Negative for: Aspiration; Asthma; Chronic Obstructive Pulmonary Disease (COPD); Pneumothorax; Sleep Apnea; Tuberculosis Cardiovascular Complaints and Symptoms: Negative for: Chest pain Medical History: Positive for: Angina; Arrhythmia - murmur; Hypertension Negative for: Congestive Heart Failure; Coronary Artery Disease; Deep Vein Thrombosis; Hypotension; Myocardial Infarction; Peripheral Arterial Disease; Peripheral Venous Disease; Phlebitis; Vasculitis Gastrointestinal Complaints and Symptoms: Negative for: Frequent diarrhea; Nausea; Vomiting Medical History: Negative for: Cirrhosis ; Colitis; Crohnos; Hepatitis A; Hepatitis B; Hepatitis C Finkbiner, Ciarrah M. ([id number]) Endocrine Complaints and Symptoms: Negative for: Hepatitis; Thyroid disease; Polydypsia (Excessive Thirst) Medical History: Negative for: Type I Diabetes; Type II Diabetes Genitourinary Complaints and Symptoms: Negative for: Kidney failure/ Dialysis; Incontinence/dribbling Medical History: Negative for: End Stage Renal Disease Immunological Complaints and Symptoms: Negative for: Hives; Itching Medical History: Negative for: Lupus Erythematosus; Raynaudos; Scleroderma Musculoskeletal Complaints and Symptoms: Negative for: Muscle Pain; Muscle Weakness Medical History: Positive for: Gout; Rheumatoid Arthritis; Osteoarthritis Negative for: Osteomyelitis Neurologic Complaints and Symptoms: Negative for: Numbness/parasthesias; Focal/Weakness Medical History: Negative for: Dementia; Neuropathy; Quadriplegia; Paraplegia; Seizure Disorder Psychiatric Complaints and Symptoms: Negative for: Anxiety; Claustrophobia Medical History: Negative for: Anorexia/bulimia;  Confinement Anxiety Integumentary (Skin) Medical History: Negative for: History of Burn; History of pressure wounds Oncologic Medical History: Negative for: Received Chemotherapy; Received Radiation HBO Extended History Items Ear/Nose/Mouth/Throat: Chronic sinus problems/congestion Immunizations Pneumococcal Vaccine: Received Pneumococcal Vaccination: No Implantable Devices None Dols, Shanyn M. ([id number]) Family and Social History Cancer: Yes - Mother; Diabetes: Yes - Siblings; Heart Disease: No; Hereditary Spherocytosis: No; Hypertension: Yes - Siblings; Kidney Disease: No; Lung Disease: Yes - Siblings; Seizures: No; Stroke: No; Thyroid Problems: No; Tuberculosis: No; Never smoker; Marital Status - Widowed; Alcohol Use: Never; Drug Use: No History; Caffeine Use: Never; Financial Concerns: No; Food, Clothing or Shelter Needs: No; Support System Lacking: No; Transportation Concerns: No Electronic Signature(s) Signed: 01/25/2020 4:08:23 PM By: Linton Ham MD Signed: 01/26/2020 9:44:09 AM By: Army Melia Entered By: Army Melia on 01/25/2020 14:21:05 [place], [name] ([id number]) -------------------------------------------------------------------------------- SuperBill Details Patient Name: [name] Date of Service: 01/25/2020 Medical Record Number: [id number] Patient Account Number: [id number] Date of Birth/Sex: Jul 03, 1941 (78 y.o. F) Treating RN: Cornell Barman Primary Care Provider: Salome Holmes Other Clinician: Referring Provider: Referral, Self Treating Provider/Extender: Tito Dine in Treatment: 0 Diagnosis Coding ICD-10 Codes Code Description L97.811 Non-pressure chronic ulcer of other part of right lower leg limited to breakdown of skin L97.228 Non-pressure chronic ulcer of left calf with other specified severity I89.0 Lymphedema, not elsewhere classified Facility Procedures CPT4 Code: [id number] Description: [id number] - WOUND CARE VISIT-LEV 3 EST  PT Modifier: Quantity: 1 CPT4 Code: [id number] Description: [id number] - DEBRIDE WOUND 1ST 20 SQ CM OR < Modifier: Quantity: 1 CPT4 Code: Description: ICD-10 Diagnosis Description L97.811 Non-pressure chronic ulcer of other part of right lower leg limited to breakdo Modifier:  wn of skin Quantity: CPT4 Code: [id number] Description: [id number] - DEBRIDE WOUND EA ADDL 20 SQ CM Modifier: Quantity: 4 CPT4 Code: Description: ICD-10 Diagnosis Description L97.811 Non-pressure chronic ulcer of other part of right lower leg limited to breakdo Modifier: wn of skin Quantity: Physician Procedures CPT4 Code: [id number] Description: [id number] - WC PHYS LEVEL 4 - EST PT Modifier: 25 Quantity: 1 CPT4 Code: Description: ICD-10 Diagnosis Description L97.811 Non-pressure chronic ulcer of other part of right lower leg limited to breakdow L97.228 Non-pressure chronic ulcer of left calf with other specified severity I89.0 Lymphedema, not elsewhere classified Modifier: n of skin Quantity: CPT4 [id number] Description: [id number] - WC PHYS DEBR WO ANESTH 20 SQ CM Modifier: Quantity: 1 CPT4 Code: Description: ICD-10 Diagnosis Description L97.811 Non-pressure chronic ulcer of other part of right lower leg limited to breakdow Modifier: n of skin Quantity: CPT4 Code: [id number] Description: [id number] - WC PHYS DEBR WO ANESTH EA ADD 20 CM Modifier: Quantity: 4 CPT4 Code: Description: ICD-10 Diagnosis Description L97.811 Non-pressure chronic ulcer of other part of right lower leg limited to breakdow Modifier: n of skin Quantity: Electronic Signature(s) Signed: 01/25/2020 4:08:23 PM By: Linton Ham MD Entered By: Linton Ham on 01/25/2020 15:40:27

## 2020-01-26 NOTE — Progress Notes (Signed)
[name], [name] ([id number]) Visit Report for 01/25/2020 Allergy List Details Patient Name: [name], [name] Date of Service: 01/25/2020 2:15 PM Medical Record Number: [id number] Patient Account Number: [id number] Date of Birth/Sex: 12/23/1940 (79 y.o. F) Treating RN: Army Melia Primary Care Cherrise Occhipinti: Salome Holmes Other Clinician: Referring York Valliant: Referral, Self Treating Mani Celestin/Extender: Ricard Dillon Weeks in Treatment: 0 Allergies Active Allergies No Known Drug Allergies Allergy Notes Electronic Signature(s) Signed: 01/26/2020 9:44:09 AM By: Army Melia Entered By: Army Melia on 01/25/2020 14:20:01 [name], [name] ([id number]) -------------------------------------------------------------------------------- Arrival Information Details Patient Name: [name] Date of Service: 01/25/2020 2:15 PM Medical Record Number: [id number] Patient Account Number: [id number] Date of Birth/Sex: [birth year] (79 y.o. F) Treating RN: Cornell Barman Primary Care Brianda Beitler: Salome Holmes Other Clinician: Referring Loren Vicens: Referral, Self Treating Cathleen Yagi/Extender: Tito Dine in Treatment: 0 Visit Information Patient Arrived: Cane Arrival Time: 14:04 Accompanied By: self Transfer Assistance: None Patient Identification Verified: Yes Secondary Verification Process Completed: Yes Patient Has Alerts: Yes Patient Alerts: 06/02/19 ABI L 0.84 R 0.82 History Since Last Visit Added or deleted any medications: No Any new allergies or adverse reactions: No Had a fall or experienced change in activities of daily living that may affect risk of falls: No Signs or symptoms of abuse/neglect since last visito No Hospitalized since last visit: No Implantable device outside of the clinic excluding cellular tissue based products placed in the center since last visit: No Pain Present Now: No Electronic Signature(s) Signed: 01/25/2020 3:20:48 PM By: Gretta Cool, BSN, RN, CWS, Kim RN,  BSN Previous Signature: 01/25/2020 3:16:46 PM Version By: Becky Sax, Sallie RCP, RRT, CHT Entered By: Gretta Cool, BSN, RN, CWS, Kim on 01/25/2020 15:20:48 [name], [name] ([id number]) -------------------------------------------------------------------------------- Clinic Level of Care Assessment Details Patient Name: [name] Date of Service: 01/25/2020 2:15 PM Medical Record Number: [id number] Patient Account Number: [id number] Date of Birth/Sex: 02/03/1941 (79 y.o. F) Treating RN: Cornell Barman Primary Care Makilah Dowda: Salome Holmes Other Clinician: Referring Naraya Stoneberg: Referral, Self Treating Teegan Brandis/Extender: Tito Dine in Treatment: 0 Clinic Level of Care Assessment Items TOOL 1 Quantity Score []  - Use when EandM and Procedure is performed on INITIAL visit 0 ASSESSMENTS - Nursing Assessment / Reassessment X - General Physical Exam (combine w/ comprehensive assessment (listed just below) when performed on new pt. 1 20 evals) X- 1 25 Comprehensive Assessment (HX, ROS, Risk Assessments, Wounds Hx, etc.) ASSESSMENTS - Wound and Skin Assessment / Reassessment []  - Dermatologic / Skin Assessment (not related to wound area) 0 ASSESSMENTS - Ostomy and/or Continence Assessment and Care []  - Incontinence Assessment and Management 0 []  - 0 Ostomy Care Assessment and Management (repouching, etc.) PROCESS - Coordination of Care X - Simple Patient / Family Education for ongoing care 1 15 []  - 0 Complex (extensive) Patient / Family Education for ongoing care X- 1 10 Staff obtains Programmer, systems, Records, Test Results / Process Orders []  - 0 Staff telephones HHA, Nursing Homes / Clarify orders / etc []  - 0 Routine Transfer to another Facility (non-emergent condition) []  - 0 Routine Hospital Admission (non-emergent condition) X- 1 15 New Admissions / Biomedical engineer / Ordering NPWT, Apligraf, etc. []  - 0 Emergency Hospital Admission (emergent condition) PROCESS -  Special Needs []  - Pediatric / Minor Patient Management 0 []  - 0 Isolation Patient Management []  - 0 Hearing / Language / Visual special needs []  - 0 Assessment of Community assistance (transportation, D/C planning, etc.) []  - 0 Additional assistance / Altered mentation []  - 0  Support Surface(s) Assessment (bed, cushion, seat, etc.) INTERVENTIONS - Miscellaneous []  - External ear exam 0 []  - 0 Patient Transfer (multiple staff / Civil Service fast streamer / Similar devices) []  - 0 Simple Staple / Suture removal (25 or less) []  - 0 Complex Staple / Suture removal (26 or more) []  - 0 Hypo/Hyperglycemic Management (do not check if billed separately) [name], [name] ([id number]) []  - 0 Ankle / Brachial Index (ABI) - do not check if billed separately Has the patient been seen at the hospital within the last three years: Yes Total Score: 85 Level Of Care: New/Established - Level 3 Electronic Signature(s) Signed: 01/25/2020 4:30:28 PM By: Gretta Cool, BSN, RN, CWS, Kim RN, BSN Entered By: Gretta Cool, BSN, RN, CWS, Kim on 01/25/2020 15:01:47 [name], [name] ([id number]) -------------------------------------------------------------------------------- Encounter Discharge Information Details Patient Name: [name] Date of Service: 01/25/2020 2:15 PM Medical Record Number: [id number] Patient Account Number: [id number] Date of Birth/Sex: 15-Jun-1941 (79 y.o. F) Treating RN: Cornell Barman Primary Care Pio Eatherly: Salome Holmes Other Clinician: Referring Annaly Skop: Referral, Self Treating Eugina Row/Extender: Tito Dine in Treatment: 0 Encounter Discharge Information Items Post Procedure Vitals Discharge Condition: Stable Temperature (F): 98.4 Ambulatory Status: Cane Pulse (bpm): 64 Discharge Destination: Home Respiratory Rate (breaths/min): 18 Transportation: Private Auto Blood Pressure (mmHg): 172/64 Accompanied By: self Schedule Follow-up Appointment: Yes Clinical Summary of Care: Electronic  Signature(s) Signed: 01/25/2020 4:30:28 PM By: Gretta Cool, BSN, RN, CWS, Kim RN, BSN Entered By: Gretta Cool, BSN, RN, CWS, Kim on 01/25/2020 15:03:43 Loflin, [name] ([id number]) -------------------------------------------------------------------------------- Lower Extremity Assessment Details Patient Name: [name] Date of Service: 01/25/2020 2:15 PM Medical Record Number: [id number] Patient Account Number: [id number] Date of Birth/Sex: 1941-05-22 (79 y.o. F) Treating RN: Army Melia Primary Care Chynah Orihuela: Salome Holmes Other Clinician: Referring Davionte Lusby: Referral, Self Treating Gable Odonohue/Extender: Tito Dine in Treatment: 0 Edema Assessment Assessed: [Left: No] [Right: No] Edema: [Left: Yes] [Right: Yes] Calf Left: Right: Point of Measurement: 35 cm From Medial Instep 46 cm 37 cm Ankle Left: Right: Point of Measurement: 11 cm From Medial Instep 35.5 cm 43 cm Vascular Assessment Pulses: Dorsalis Pedis Palpable: [Left:Yes] [Right:Yes] Notes unable to obtain ABIs d/t pain Electronic Signature(s) Signed: 01/26/2020 9:44:09 AM By: Army Melia Entered By: Army Melia on 01/25/2020 14:29:21 Torpey, [name] ([id number]) -------------------------------------------------------------------------------- Multi Wound Chart Details Patient Name: [name] Date of Service: 01/25/2020 2:15 PM Medical Record Number: [id number] Patient Account Number: [id number] Date of Birth/Sex: Dec 15, 1940 (79 y.o. F) Treating RN: Cornell Barman Primary Care Thersia Petraglia: Salome Holmes Other Clinician: Referring Jaimes Eckert: Referral, Self Treating Jadin Creque/Extender: Tito Dine in Treatment: 0 Vital Signs Height(in): 65 Pulse(bpm): 77 Weight(lbs): 140 Blood Pressure(mmHg): 172/64 Body Mass Index(BMI): 23 Temperature(F): 98.4 Respiratory Rate(breaths/min): 18 Photos: [N/A:N/A] Wound Location: Right, Anterior Lower Leg Left, Posterior Lower Leg N/A Wounding Event: Blister  Gradually Appeared N/A Primary Etiology: Venous Leg Ulcer Venous Leg Ulcer N/A Comorbid History: Chronic sinus problems/congestion, Chronic sinus problems/congestion, N/A Anemia, Lymphedema, Angina, Anemia, Lymphedema, Angina, Arrhythmia, Hypertension, Gout, Arrhythmia, Hypertension, Gout, Rheumatoid Arthritis, Osteoarthritis Rheumatoid Arthritis, Osteoarthritis Date Acquired: 01/22/2020 01/21/2020 N/A Weeks of Treatment: 0 0 N/A Wound Status: Open Open N/A Measurements L x W x D (cm) 16x25x0.1 3.5x2.5x0.1 N/A Area (cm) : 314.159 6.872 N/A Volume (cm) : 31.416 0.687 N/A Classification: Unclassifiable Full Thickness Without Exposed N/A Support Structures Exudate Amount: Medium Medium N/A Exudate Type: Serous Serosanguineous N/A Exudate Color: amber red, brown N/A Granulation Amount: None Present (0%) Small (1-33%) N/A Granulation Quality: N/A Red N/A  Necrotic Amount: None Present (0%) Large (67-100%) N/A Exposed Structures: Fat Layer (Subcutaneous Tissue) Fat Layer (Subcutaneous Tissue) N/A Exposed: Yes Exposed: Yes Fascia: No Tendon: No Muscle: No Joint: No Bone: No Epithelialization: None None N/A Treatment Notes Electronic Signature(s) Signed: 01/25/2020 4:30:28 PM By: Gretta Cool, BSN, RN, CWS, Kim RN, BSN Entered By: Gretta Cool, BSN, RN, CWS, Kim on 01/25/2020 14:54:09 Cunning, [name] ([id number]) Kleinert, [name] ([id number]) -------------------------------------------------------------------------------- [place] Details Patient Name: [name] Date of Service: 01/25/2020 2:15 PM Medical Record Number: [id number] Patient Account Number: [id number] Date of Birth/Sex: 1941/02/27 (79 y.o. F) Treating RN: Cornell Barman Primary Care Chesnie Capell: Salome Holmes Other Clinician: Referring Pranit Owensby: Referral, Self Treating Winslow Verrill/Extender: Tito Dine in Treatment: 0 Active Inactive Orientation to the Wound Care Program Nursing Diagnoses: Knowledge  deficit related to the wound healing center program Goals: Patient/caregiver will verbalize understanding of the [place] Program Date Initiated: 01/25/2020 Target Resolution Date: 02/01/2020 Goal Status: Active Interventions: Provide education on orientation to the wound center Notes: Wound/Skin Impairment Nursing Diagnoses: Impaired tissue integrity Goals: Ulcer/skin breakdown will have a volume reduction of 30% by week 4 Date Initiated: 01/25/2020 Target Resolution Date: 02/24/2020 Goal Status: Active Interventions: Assess ulceration(s) every visit Treatment Activities: Topical wound management initiated : 01/25/2020 Notes: Electronic Signature(s) Signed: 01/25/2020 4:30:28 PM By: Gretta Cool, BSN, RN, CWS, Kim RN, BSN Entered By: Gretta Cool, BSN, RN, CWS, Kim on 01/25/2020 14:53:47 Caperton, [name] ([id number]) -------------------------------------------------------------------------------- Pain Assessment Details Patient Name: [name] Date of Service: 01/25/2020 2:15 PM Medical Record Number: [id number] Patient Account Number: [id number] Date of Birth/Sex: Jan 01, 1941 (79 y.o. F) Treating RN: Cornell Barman Primary Care Selwyn Reason: Salome Holmes Other Clinician: Referring Hodge Stachnik: Referral, Self Treating Herby Amick/Extender: Tito Dine in Treatment: 0 Active Problems Location of Pain Severity and Description of Pain Patient Has Paino No Site Locations Pain Management and Medication Current Pain Management: Electronic Signature(s) Signed: 01/25/2020 3:16:46 PM By: Lorine Bears RCP, RRT, CHT Signed: 01/25/2020 4:30:28 PM By: Gretta Cool, BSN, RN, CWS, Kim RN, BSN Entered By: Lorine Bears on 01/25/2020 14:04:45 Akridge, [name] ([id number]) -------------------------------------------------------------------------------- Patient/Caregiver Education Details Patient Name: [name] Date of Service: 01/25/2020 2:15 PM Medical Record  Number: [id number] Patient Account Number: [id number] Date of Birth/Gender: 02/07/1941 (79 y.o. F) Treating RN: Cornell Barman Primary Care Physician: Salome Holmes Other Clinician: Referring Physician: Referral, Self Treating Physician/Extender: Tito Dine in Treatment: 0 Education Assessment Education Provided To: Patient Education Topics Provided Venous: Handouts: Controlling Swelling with Multilayered Compression Wraps Methods: Demonstration, Explain/Verbal Responses: State content correctly Wound Debridement: Handouts: Wound Debridement Methods: Demonstration, Explain/Verbal Responses: State content correctly Electronic Signature(s) Signed: 01/25/2020 4:30:28 PM By: Gretta Cool, BSN, RN, CWS, Kim RN, BSN Entered By: Gretta Cool, BSN, RN, CWS, Kim on 01/25/2020 15:02:37 Carbonneau, [name] ([id number]) -------------------------------------------------------------------------------- Wound Assessment Details Patient Name: [name] Date of Service: 01/25/2020 2:15 PM Medical Record Number: [id number] Patient Account Number: [id number] Date of Birth/Sex: Jan 09, 1941 (79 y.o. F) Treating RN: Army Melia Primary Care Cathlene Gardella: Salome Holmes Other Clinician: Referring Vetta Couzens: Referral, Self Treating Jonea Bukowski/Extender: Tito Dine in Treatment: 0 Wound Status Wound Number: 4 Primary Venous Leg Ulcer Etiology: Wound Location: Right, Anterior Lower Leg Wound Open Wounding Event: Blister Status: Date Acquired: 01/22/2020 Comorbid Chronic sinus problems/congestion, Anemia, Lymphedema, Weeks Of Treatment: 0 History: Angina, Arrhythmia, Hypertension, Gout, Rheumatoid Clustered Wound: No Arthritis, Osteoarthritis Photos Wound Measurements Length: (cm) 16 Width: (cm) 25 Depth: (cm) 0.1 Area: (cm) 314.159 Volume: (cm) 31.416 %  Reduction in Area: 0% % Reduction in Volume: 0% Epithelialization: None Tunneling: No Undermining: No Wound Description Classification:  Unclassifiable Exudate Amount: Medium Exudate Type: Serous Exudate Color: amber Foul Odor After Cleansing: No Slough/Fibrino Yes Wound Bed Granulation Amount: None Present (0%) Exposed Structure Necrotic Amount: None Present (0%) Fascia Exposed: No Fat Layer (Subcutaneous Tissue) Exposed: Yes Tendon Exposed: No Muscle Exposed: No Joint Exposed: No Bone Exposed: No Treatment Notes Wound #4 (Right, Anterior Lower Leg) Notes Sivercell 3 layer bilateral Electronic Signature(s) Signed: 01/25/2020 4:37:53 PM By: Gretta Cool, BSN, RN, CWS, Kim RN, BSN Signed: 01/26/2020 9:44:09 AM By: Luna Glasgow ([id number]) Entered By: Gretta Cool, BSN, RN, CWS, Kim on 01/25/2020 16:37:53 Lecy, [name] ([id number]) -------------------------------------------------------------------------------- Wound Assessment Details Patient Name: [name] Date of Service: 01/25/2020 2:15 PM Medical Record Number: [id number] Patient Account Number: [id number] Date of Birth/Sex: 1941-04-21 (79 y.o. F) Treating RN: Army Melia Primary Care Federica Allport: Salome Holmes Other Clinician: Referring Sonoma Firkus: Referral, Self Treating Katty Fretwell/Extender: Tito Dine in Treatment: 0 Wound Status Wound Number: 5 Primary Venous Leg Ulcer Etiology: Wound Location: Left, Posterior Lower Leg Wound Open Wounding Event: Gradually Appeared Status: Date Acquired: 01/21/2020 Comorbid Chronic sinus problems/congestion, Anemia, Lymphedema, Weeks Of Treatment: 0 History: Angina, Arrhythmia, Hypertension, Gout, Rheumatoid Clustered Wound: No Arthritis, Osteoarthritis Photos Wound Measurements Length: (cm) 3.5 Width: (cm) 2.5 Depth: (cm) 0.1 Area: (cm) 6.872 Volume: (cm) 0.687 % Reduction in Area: % Reduction in Volume: Epithelialization: None Tunneling: No Undermining: No Wound Description Classification: Full Thickness Without Exposed Support Structures Exudate Amount: Medium Exudate Type:  Serosanguineous Exudate Color: red, brown Foul Odor After Cleansing: No Wound Bed Granulation Amount: Small (1-33%) Exposed Structure Granulation Quality: Red Fat Layer (Subcutaneous Tissue) Exposed: Yes Necrotic Amount: Large (67-100%) Necrotic Quality: Adherent Slough Treatment Notes Wound #5 (Left, Posterior Lower Leg) Notes Sivercell 3 layer bilateral Electronic Signature(s) Signed: 01/26/2020 9:44:09 AM By: Army Melia Entered By: Army Melia on 01/25/2020 14:27:38 Kibler, [name] ([id number]) -------------------------------------------------------------------------------- Vitals Details Patient Name: [name] Date of Service: 01/25/2020 2:15 PM Medical Record Number: [id number] Patient Account Number: [id number] Date of Birth/Sex: 1941/01/20 (79 y.o. F) Treating RN: Cornell Barman Primary Care Leeann Bady: Salome Holmes Other Clinician: Referring Neidy Guerrieri: Referral, Self Treating Ardit Danh/Extender: Tito Dine in Treatment: 0 Vital Signs Time Taken: 14:05 Temperature (F): 98.4 Height (in): 65 Pulse (bpm): 64 Source: Stated Respiratory Rate (breaths/min): 18 Weight (lbs): 140 Blood Pressure (mmHg): 172/64 Source: Measured Reference Range: 80 - 120 mg / dl Body Mass Index (BMI): 23.3 Electronic Signature(s) Signed: 01/25/2020 3:16:46 PM By: Lorine Bears RCP, RRT, CHT Entered By: Becky Sax, Amado Nash on 01/25/2020 14:09:02

## 2020-02-01 ENCOUNTER — Other Ambulatory Visit: Payer: Self-pay

## 2020-02-01 ENCOUNTER — Encounter: Payer: Medicare (Managed Care) | Admitting: Internal Medicine

## 2020-02-01 DIAGNOSIS — L97811 Non-pressure chronic ulcer of other part of right lower leg limited to breakdown of skin: Secondary | ICD-10-CM | POA: Diagnosis not present

## 2020-02-02 NOTE — Progress Notes (Signed)
SALMAI, CREGAN ([id number]) Visit Report for 02/01/2020 Debridement Details Patient Name: [name], [name] Date of Service: 02/01/2020 3:00 PM Medical Record Number: [id number] Patient Account Number: [id number] Date of Birth/Sex: Aug 14, 1941 (78 y.o. F) Treating RN: Cornell Barman Primary Care Provider: Salome Holmes Other Clinician: Referring Provider: Salome Holmes Treating Provider/Extender: Tito Dine in Treatment: 1 Debridement Performed for Wound #5 Left,Posterior Lower Leg Assessment: Performed By: Physician Ricard Dillon, MD Debridement Type: Debridement Severity of Tissue Pre Debridement: Fat layer exposed Level of Consciousness (Pre- Awake and Alert procedure): Pre-procedure Verification/Time Out Yes - 15:44 Taken: Start Time: 15:44 Pain Control: Lidocaine Total Area Debrided (L x W): 4 (cm) x 5 (cm) = 20 (cm) Tissue and other material debrided: Viable, Non-Viable, Slough, Subcutaneous, Slough Level: Skin/Subcutaneous Tissue Debridement Description: Excisional Instrument: Curette Bleeding: Moderate Hemostasis Achieved: Pressure End Time: 15:44 Response to Treatment: Procedure was tolerated well Level of Consciousness (Post- Awake and Alert procedure): Post Debridement Measurements of Total Wound Length: (cm) 4 Width: (cm) 5 Depth: (cm) 0.1 Volume: (cm) 1.571 Character of Wound/Ulcer Post Debridement: Stable Severity of Tissue Post Debridement: Fat layer exposed Post Procedure Diagnosis Same as Pre-procedure Electronic Signature(s) Signed: 02/02/2020 7:42:01 AM By: Linton Ham MD Signed: 02/02/2020 5:10:21 PM By: Gretta Cool, BSN, RN, CWS, Kim RN, BSN Entered By: Linton Ham on 02/01/2020 16:46:36 Takahashi, [name] ([id number]) -------------------------------------------------------------------------------- HPI Details Patient Name: [name] Date of Service: 02/01/2020 3:00 PM Medical Record Number: [id number] Patient Account Number:  [id number] Date of Birth/Sex: 28-Nov-1940 (78 y.o. F) Treating RN: Cornell Barman Primary Care Provider: Salome Holmes Other Clinician: Referring Provider: Salome Holmes Treating Provider/Extender: Tito Dine in Treatment: 1 History of Present Illness HPI Description: 03/05/18 on evaluation today patient presents for initial evaluation and our clinic due to significantly large blisters of the bilateral lower extremities which began initially on 02/18/18. 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 Secondarily it does appear that they felt she likely had cellulitis. Subsequently the patient was seen for fault evaluation on 03/03/18 and that is really when the cellulitis was diagnosed. She was actually placed on doxycycline 100 mg by mouth twice a day for 10 days. With that being said she was prescribed prednisone although she did not tolerate this. Apparently she had some alteration in her mental status with taking this. 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 She has never smoked and unfortunately does have pain at the site where these blisters are located of the bilateral lower extremities. She also does appear to have significant lymphedema which obviously has been more chronic. 03/12/18 on evaluation today patient actually appears to be doing much better in regard to her bilateral [employer] ulcers. Unfortunately a lot of the alginate dressing did stick to her legs due to the fact that everything dried up rather rapidly in the compression seems to have done very well in that regard. Unfortunately the home health nurse last time she came out to the patient's home was not able to fully remove the alginate from her leg so she just rewrapped it with what was there continuing to be in place. Obviously that does need to come off but  overall I'm extremely pleased with the progress it's been made over the past week which is the Kerlex and Coban wraps. 03/18/18-She is here in follow-up evaluation  for bilateral lower extremity lymphedema with blister/ulceration. She is essentially healed to the right lower extremity with one residual pinpoint opening, the left lower extremity has multiple pinpoint openings with minimal amount of serous drainage. We will continue with compression over this next week. We have requested that home help initiate the process of ordering compression wraps for long-term management. She will follow-up next week, I anticipate she will be healed at that appointment 03/26/18 on evaluation today patient appears to be doing extremely well in regard to her bilateral lower extremity ulcers. This has completely resolved for the most part on the left lower extremity and has completely resolved in regard to the right lower extremity. She's been tolerating the dressing changes without complication including the wraps. Overall her swelling is down significantly and I think this accounts for why she is doing so much better at this time. Overall she is very pleased and having very little discomfort compared to previous. 04/02/18 on evaluation today patient actually appears to be completely healed which is excellent news. She has been tolerating the wraps without complication obviously this has done very well for her. In general she shows no signs of infection or otherwise complicating factors. She's having no pain. Readmission: 06/02/2019 on evaluation today patient appears to be unfortunately experiencing issues with increased swelling of her bilateral lower extremities. I have last seen her in June 2019. At that time everything healed and it does appear that we actually got her some Velcro compression at that time. Nonetheless she states she was wearing that until about 5 weeks ago and then subsequently she quit wearing it  she tells me the only excuse that she can give is "laziness". Nonetheless she is not able to get any compression sleeves/stockings on. she tells me currently that she is not experiencing any evidence of infection at this time she is been having some leaking intermittently from her lower extremities right now the left on the lateral portion is the only area open. No fevers, chills, nausea, vomiting, or diarrhea. 06/09/2019 on evaluation today patient actually appears to be doing quite well with regard to her leg ulcers in fact everything appears to be completely closed at this time bilaterally. There is no signs of active infection at this time which is good news. Overall I am very pleased. She did bring her Velcro compression wraps with her today. READMISSION 01/25/2020 This is a now 79 year old woman who is been in this clinic for a prolonged period of time in 2019 and more recently in 2020 with bilateral lower extremity blisters and wounds in the setting of chronic lymphedema. She is generally fairly noncompliant with compression. When she was here in 2020 she was discharged with bilateral zippered stockings but she tells as these stopped working and she has not been wearing anything for a long period of time. She came in today after developing a substantial superficial blister on the right anterior and lateral lower leg which she said started on Sunday. She has not had any pain. As noted she is not wearing compression stockings. Past medical history includes chronic myelocytic leukemia presumably in remission although I did not really see anything about this on [place] link, coronary artery disease, hypertension, lupus on hydroxychloroquine, stage III chronic renal failure with an estimated GFR of 39 based on lab work in February of this year. We did not do her ABIs today although most recently in 2020 these were 0.84 and 0.82 right and left respectively 4/21; substantial wound on  the right  lateral lower leg which was a humongous blister last week. She also has an area on the right lateral calf. We put her in compression with silver alginate. CHANTAY, ELLWOOD ([id number]) Electronic Signature(s) Signed: 02/02/2020 7:42:01 AM By: Linton Ham MD Entered By: Linton Ham on 02/01/2020 16:47:17 Montz, [name] ([id number]) -------------------------------------------------------------------------------- Physical Exam Details Patient Name: [name] Date of Service: 02/01/2020 3:00 PM Medical Record Number: [id number] Patient Account Number: [id number] Date of Birth/Sex: 30-Aug-1941 (78 y.o. F) Treating RN: Cornell Barman Primary Care Provider: Salome Holmes Other Clinician: Referring Provider: Salome Holmes Treating Provider/Extender: Tito Dine in Treatment: 1 Constitutional Patient is hypertensive.. Pulse regular and within target range for patient.Marland Kitchen Respirations regular, non-labored and within target range.. Temperature is normal and within the target range for the patient.Marland Kitchen appears in no distress. Cardiovascular We have decent edema control. Notes Wound examination; massive blister on the right anterior lower leg that was vacuolated last week. Wound surface looks satisfactory but large. Under illumination there is still debris. On the left lateral lower leg the wound areas covered in adherent debris which I removed with a #5 curette. Hemostasis with direct pressure Electronic Signature(s) Signed: 02/02/2020 7:42:01 AM By: Linton Ham MD Entered By: Linton Ham on 02/01/2020 16:51:08 Bartko, [name] ([id number]) -------------------------------------------------------------------------------- Physician Orders Details Patient Name: [name] Date of Service: 02/01/2020 3:00 PM Medical Record Number: [id number] Patient Account Number: [id number] Date of Birth/Sex: [birth year] (78 y.o. F) Treating RN: Cornell Barman Primary Care Provider: Salome Holmes Other Clinician: Referring Provider: Salome Holmes Treating Provider/Extender: Tito Dine in Treatment: 1 Verbal / Phone Orders: No Diagnosis Coding Wound Cleansing Wound #4 Right,Anterior Lower Leg o Clean wound with Normal Saline. Wound #5 Left,Posterior Lower Leg o Clean wound with Normal Saline. Skin Barriers/Peri-Wound Care Wound #5 Left,Posterior Lower Leg o Barrier cream Primary Wound Dressing Wound #4 Right,Anterior Lower Leg o Silver Alginate Wound #5 Left,Posterior Lower Leg o Silver Alginate Secondary Dressing Wound #4 Right,Anterior Lower Leg o XtraSorb Wound #5 Left,Posterior Lower Leg o ABD pad Dressing Change Frequency Wound #4 Right,Anterior Lower Leg o Change dressing every week Wound #5 Left,Posterior Lower Leg o Change dressing every week Follow-up Appointments Wound #4 Right,Anterior Lower Leg o Return Appointment in 1 week. o Nurse Visit as needed Wound #5 Left,Posterior Lower Leg o Return Appointment in 1 week. o Nurse Visit as needed Edema Control Wound #4 Right,Anterior Lower Leg o 3 Layer Compression System - Bilateral o Elevate legs to the level of the heart and pump ankles as often as possible Wound #5 Left,Posterior Lower Leg o 3 Layer Compression System - Bilateral o Elevate legs to the level of the heart and pump ankles as often as possible NIVEAH, NOLLE ([id number]) Electronic Signature(s) Signed: 02/02/2020 7:42:01 AM By: Linton Ham MD Signed: 02/02/2020 5:10:21 PM By: Gretta Cool, BSN, RN, CWS, Kim RN, BSN Entered By: Gretta Cool, BSN, RN, CWS, Kim on 02/01/2020 15:47:19 Ober, [name] ([id number]) -------------------------------------------------------------------------------- Problem List Details Patient Name: [name] Date of Service: 02/01/2020 3:00 PM Medical Record Number: [id number] Patient Account Number: [id number] Date of Birth/Sex: [birth year] (78 y.o. F) Treating RN:  Cornell Barman Primary Care Provider: Salome Holmes Other Clinician: Referring Provider: Salome Holmes Treating Provider/Extender: Tito Dine in Treatment: 1 Active Problems ICD-10 Evaluated Encounter Code Description Active Date Today Diagnosis L97.811 Non-pressure chronic ulcer of other part of right lower leg limited to 01/25/2020 No Yes breakdown of skin L97.228 Non-pressure chronic ulcer  of left calf with other specified severity 01/25/2020 No Yes I89.0 Lymphedema, not elsewhere classified 01/25/2020 No Yes Inactive Problems Resolved Problems Electronic Signature(s) Signed: 02/02/2020 7:42:01 AM By: Linton Ham MD Entered By: Linton Ham on 02/01/2020 16:45:46 Commerford, [name] ([id number]) -------------------------------------------------------------------------------- Progress Note Details Patient Name: [name] Date of Service: 02/01/2020 3:00 PM Medical Record Number: [id number] Patient Account Number: [id number] Date of Birth/Sex: December 09, 1940 (78 y.o. F) Treating RN: Cornell Barman Primary Care Provider: Salome Holmes Other Clinician: Referring Provider: Salome Holmes Treating Provider/Extender: Tito Dine in Treatment: 1 Subjective History of Present Illness (HPI) 03/05/18 on evaluation today patient presents for initial evaluation and our clinic due to significantly large blisters of the bilateral lower extremities which began initially on 02/18/18. 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 Secondarily it does appear that they felt she likely had cellulitis. Subsequently the patient was seen for fault evaluation on 03/03/18 and that is really when the cellulitis was diagnosed. She was actually placed on doxycycline 100 mg by mouth twice a day for 10 days. With that being said she was prescribed prednisone although she did not tolerate this.  Apparently she had some alteration in her mental status with taking this. 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 She has never smoked and unfortunately does have pain at the site where these blisters are located of the bilateral lower extremities. She also does appear to have significant lymphedema which obviously has been more chronic. 03/12/18 on evaluation today patient actually appears to be doing much better in regard to her bilateral [employer] ulcers. Unfortunately a lot of the alginate dressing did stick to her legs due to the fact that everything dried up rather rapidly in the compression seems to have done very well in that regard. Unfortunately the home health nurse last time she came out to the patient's home was not able to fully remove the alginate from her leg so she just rewrapped it with what was there continuing to be in place. Obviously that does need to come off but overall I'm extremely pleased with the progress it's been made over the past week which is the Kerlex and Coban wraps. 03/18/18-She is here in follow-up evaluation for bilateral lower extremity lymphedema with blister/ulceration. She is essentially healed to the right lower extremity with one residual pinpoint opening, the left lower extremity has multiple pinpoint openings with minimal amount of serous drainage. We will continue with compression over this next week. We have requested that home help initiate the process of ordering compression wraps for long-term management. She will follow-up next week, I anticipate she will be healed at that appointment 03/26/18 on evaluation today patient appears to be doing extremely well in regard to her bilateral lower extremity ulcers. This has completely resolved for the most part on the left lower extremity and has completely resolved in regard to the right lower extremity. She's been tolerating the dressing changes  without complication including the wraps. Overall her swelling is down significantly and I think this accounts for why she is doing so much better at this time. Overall she is very pleased and having very little discomfort compared to previous. 04/02/18 on evaluation today patient actually appears to be completely healed which is excellent news. She has been tolerating the wraps without complication obviously this has done  very well for her. In general she shows no signs of infection or otherwise complicating factors. She's having no pain. Readmission: 06/02/2019 on evaluation today patient appears to be unfortunately experiencing issues with increased swelling of her bilateral lower extremities. I have last seen her in June 2019. At that time everything healed and it does appear that we actually got her some Velcro compression at that time. Nonetheless she states she was wearing that until about 5 weeks ago and then subsequently she quit wearing it she tells me the only excuse that she can give is "laziness". Nonetheless she is not able to get any compression sleeves/stockings on. she tells me currently that she is not experiencing any evidence of infection at this time she is been having some leaking intermittently from her lower extremities right now the left on the lateral portion is the only area open. No fevers, chills, nausea, vomiting, or diarrhea. 06/09/2019 on evaluation today patient actually appears to be doing quite well with regard to her leg ulcers in fact everything appears to be completely closed at this time bilaterally. There is no signs of active infection at this time which is good news. Overall I am very pleased. She did bring her Velcro compression wraps with her today. READMISSION 01/25/2020 This is a now 79 year old woman who is been in this clinic for a prolonged period of time in 2019 and more recently in 2020 with bilateral lower extremity blisters and wounds in the setting  of chronic lymphedema. She is generally fairly noncompliant with compression. When she was here in 2020 she was discharged with bilateral zippered stockings but she tells as these stopped working and she has not been wearing anything for a long period of time. She came in today after developing a substantial superficial blister on the right anterior and lateral lower leg which she said started on Sunday. She has not had any pain. As noted she is not wearing compression stockings. Past medical history includes chronic myelocytic leukemia presumably in remission although I did not really see anything about this on [place] link, coronary artery disease, hypertension, lupus on hydroxychloroquine, stage III chronic renal failure with an estimated GFR of 39 based on lab work in February of this year. We did not do her ABIs today although most recently in 2020 these were 0.84 and 0.82 right and left respectively 4/21; substantial wound on the right lateral lower leg which was a humongous blister last week. She also has an area on the right lateral calf. We put her in compression with silver alginate. ILYSE, WEILERT ([id number]) Objective Constitutional Patient is hypertensive.. Pulse regular and within target range for patient.Marland Kitchen Respirations regular, non-labored and within target range.. Temperature is normal and within the target range for the patient.Marland Kitchen appears in no distress. Vitals Time Taken: 3:08 PM, Height: 65 in, Weight: 140 lbs, BMI: 23.3, Temperature: 98.1 F, Pulse: 72 bpm, Respiratory Rate: 18 breaths/min, Blood Pressure: 139/91 mmHg. Cardiovascular We have decent edema control. General Notes: Wound examination; massive blister on the right anterior lower leg that was vacuolated last week. Wound surface looks satisfactory but large. Under illumination there is still debris. On the left lateral lower leg the wound areas covered in adherent debris which I removed with a #5  curette. Hemostasis with direct pressure Integumentary (Hair, Skin) Wound #4 status is Open. Original cause of wound was Blister. The wound is located on the Right,Anterior Lower Leg. The wound measures 16cm length x 17cm width x 0.1cm depth;  213.628cm^2 area and 21.363cm^3 volume. There is Fat Layer (Subcutaneous Tissue) Exposed exposed. There is a medium amount of serous drainage noted. There is no granulation within the wound bed. There is no necrotic tissue within the wound bed. Wound #5 status is Open. Original cause of wound was Gradually Appeared. The wound is located on the Left,Posterior Lower Leg. The wound measures 4cm length x 5cm width x 0.1cm depth; 15.708cm^2 area and 1.571cm^3 volume. There is Fat Layer (Subcutaneous Tissue) Exposed exposed. There is a medium amount of serosanguineous drainage noted. There is small (1-33%) red granulation within the wound bed. There is a large (67-100%) amount of necrotic tissue within the wound bed including Adherent Slough. 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 There was a Excisional Skin/Subcutaneous Tissue Debridement with a total area of 20 sq cm performed by Ricard Dillon, MD. With the following instrument(s): Curette to remove Viable and Non-Viable tissue/material. Material removed includes Subcutaneous Tissue and Slough and after achieving pain control using Lidocaine. No specimens were taken. A time out was conducted at 15:44, prior to the start of the procedure. A Moderate amount of bleeding was controlled with Pressure. The procedure was tolerated well. Post  Debridement Measurements: 4cm length x 5cm width x 0.1cm depth; 1.571cm^3 volume. Character of Wound/Ulcer Post Debridement is stable. Severity of Tissue Post Debridement is: Fat layer exposed. Post procedure Diagnosis Wound #5: Same as Pre-Procedure Balistreri, Laron M. ([id number]) Plan Wound Cleansing: Wound #4 Right,Anterior Lower Leg: Clean wound with Normal Saline. Wound #5 Left,Posterior Lower Leg: Clean wound with Normal Saline. Skin Barriers/Peri-Wound Care: Wound #5 Left,Posterior Lower Leg: Barrier cream Primary Wound Dressing: Wound #4 Right,Anterior Lower Leg: Silver Alginate Wound #5 Left,Posterior Lower Leg: Silver Alginate Secondary Dressing: Wound #4 Right,Anterior Lower Leg: XtraSorb Wound #5 Left,Posterior Lower Leg: ABD pad Dressing Change Frequency: Wound #4 Right,Anterior Lower Leg: Change dressing every week Wound #5 Left,Posterior Lower Leg: Change dressing every week Follow-up Appointments: Wound #4 Right,Anterior Lower Leg: Return Appointment in 1 week. Nurse Visit as needed Wound #5 Left,Posterior Lower Leg: Return Appointment in 1 week. Nurse Visit as needed Edema Control: Wound #4 Right,Anterior Lower Leg: 3 Layer Compression System - Bilateral Elevate legs to the level of the heart and pump ankles as often as possible Wound #5 Left,Posterior Lower Leg: 3 Layer Compression System - Bilateral Elevate legs to the level of the heart and pump ankles as often as possible 1. Continuing silver alginate to both wound areas including the very large wound on the right anterior lower leg 2. The patient was not compliant with compression stockings at home however she is compliant with the wraps that we are placing. 3. Xtrasorb to except some more of the drainage with ABDs 4. No evidence of infection Electronic Signature(s) Signed: 02/02/2020 7:42:01 AM By: Linton Ham MD Entered By: Linton Ham on 02/01/2020 16:52:14 Poupard, [name]  ([id number]) -------------------------------------------------------------------------------- SuperBill Details Patient Name: [name] Date of Service: 02/01/2020 Medical Record Number: [id number] Patient Account Number: [id number] Date of Birth/Sex: 02/12/1941 (78 y.o. F) Treating RN: Cornell Barman Primary Care Provider: Salome Holmes Other Clinician: Referring Provider: Salome Holmes Treating Provider/Extender: Tito Dine in Treatment: 1  Diagnosis Coding ICD-10 Codes Code Description [id number] Non-pressure chronic ulcer of other part of right lower leg limited to breakdown of skin L97.228 Non-pressure chronic ulcer of left calf with other specified severity I89.0 Lymphedema, not elsewhere classified Facility Procedures CPT4 Code: [id number] Description: [id number] - DEB SUBQ TISSUE 20 SQ CM/< Modifier: Quantity: 1 CPT4 Code: Description: ICD-10 Diagnosis Description L97.811 Non-pressure chronic ulcer of other part of right lower leg limited to break L97.228 Non-pressure chronic ulcer of left calf with other specified severity Modifier: down of skin Quantity: Physician Procedures CPT4 Code: [id number] Description: [id number] - WC PHYS SUBQ TISS 20 SQ CM Modifier: Quantity: 1 CPT4 Code: Description: ICD-10 Diagnosis Description 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

## 2020-02-02 NOTE — Progress Notes (Signed)
[name] [name] ([id number]) Visit Report for 02/01/2020 Arrival Information Details Patient Name: [name], [name] Date of Service: 02/01/2020 3:00 PM Medical Record Number: [id number] Patient Account Number: [id number] Date of Birth/Sex: 09-10-1941 (79 y.o. F) Treating RN: Cornell Barman Primary Care Aaryn Sermon: Salome Holmes Other Clinician: Referring Meshilem Machuca: Salome Holmes Treating Esma Kilts/Extender: Tito Dine in Treatment: 1 Visit Information History Since Last Visit Added or deleted any medications: No Patient Arrived: [name] [name] Any new allergies or adverse reactions: No Arrival Time: 15:06 Had a fall or experienced change in No Accompanied By: self activities of daily living that may affect Transfer Assistance: None risk of falls: Patient Identification Verified: Yes Signs or symptoms of abuse/neglect since last visito No Secondary Verification Process Completed: Yes Hospitalized since last visit: No Patient Has Alerts: Yes Implantable device outside of the clinic excluding No Patient Alerts: 06/02/19 ABI cellular tissue based products placed in the center L 0.84 R 0.82 since last visit: Has Dressing in Place as Prescribed: Yes Has Compression in Place as Prescribed: Yes Pain Present Now: No Electronic Signature(s) Signed: 02/01/2020 4:35:46 PM By: Lorine Bears RCP, RRT, CHT Entered By: Lorine Bears on 02/01/2020 15:07:34 [name] [name], [name] [name] ([id number]) -------------------------------------------------------------------------------- Encounter Discharge Information Details Patient Name: [name] [name] Date of Service: 02/01/2020 3:00 PM Medical Record Number: [id number] Patient Account Number: [id number] Date of Birth/Sex: Sep 19, 1941 (79 y.o. F) Treating RN: Cornell Barman Primary Care Akira Perusse: Salome Holmes Other Clinician: Referring Juanmiguel Defelice: Salome Holmes Treating Karstyn Birkey/Extender: Tito Dine in Treatment: 1 Encounter  Discharge Information Items Post Procedure Vitals Discharge Condition: Stable Temperature (F): 98.1 Ambulatory Status: Ambulatory Pulse (bpm): 72 Discharge Destination: Home Respiratory Rate (breaths/min): 16 Transportation: Private Auto Blood Pressure (mmHg): 139/91 Accompanied By: self Schedule Follow-up Appointment: Yes Clinical Summary of Care: Electronic Signature(s) Signed: 02/02/2020 5:10:21 PM By: Gretta Cool, BSN, RN, CWS, Kim RN, BSN Entered By: Gretta Cool, BSN, RN, CWS, Kim on 02/01/2020 15:49:15 [name], [name] [name] ([id number]) -------------------------------------------------------------------------------- Lower Extremity Assessment Details Patient Name: [name] [name] Date of Service: 02/01/2020 3:00 PM Medical Record Number: [id number] Patient Account Number: [id number] Date of Birth/Sex: 26-Apr-1941 (79 y.o. F) Treating RN: Army Melia Primary Care Loana Salvaggio: Salome Holmes Other Clinician: Referring Uno Esau: Salome Holmes Treating Kaci Freel/Extender: Tito Dine in Treatment: 1 Edema Assessment Assessed: [Left: No] [Right: No] Edema: [Left: Yes] [Right: Yes] Calf Left: Right: Point of Measurement: 35 cm From Medial Instep 43 cm 36 cm Ankle Left: Right: Point of Measurement: 11 cm From Medial Instep 33 cm 30 cm Vascular Assessment Pulses: Dorsalis Pedis Palpable: [Left:Yes] [Right:Yes] Electronic Signature(s) Signed: 02/01/2020 4:35:57 PM By: Army Melia Entered By: Army Melia on 02/01/2020 15:36:50 [name] [name], [name] [name] ([id number]) -------------------------------------------------------------------------------- Multi Wound Chart Details Patient Name: [name] [name] Date of Service: 02/01/2020 3:00 PM Medical Record Number: [id number] Patient Account Number: [id number] Date of Birth/Sex: [birth year] (79 y.o. F) Treating RN: Cornell Barman Primary Care Trinten Boudoin: Salome Holmes Other Clinician: Referring Johne Buckle: Salome Holmes Treating Iker Nuttall/Extender: Tito Dine in Treatment: 1 Vital Signs Height(in): 65 Pulse(bpm): 35 Weight(lbs): 140 Blood Pressure(mmHg): 139/91 Body Mass Index(BMI): 23 Temperature(F): 98.1 Respiratory Rate(breaths/min): 18 Photos: [N/A:N/A] Wound Location: Right, Anterior Lower Leg Left, Posterior Lower Leg N/A Wounding Event: Blister Gradually Appeared N/A Primary Etiology: Venous Leg Ulcer Venous Leg Ulcer N/A Comorbid History: Chronic sinus problems/congestion, Chronic sinus problems/congestion, N/A Anemia, Lymphedema, Angina, Anemia, Lymphedema, Angina, Arrhythmia, Hypertension, Gout, Arrhythmia, Hypertension, Gout, Rheumatoid Arthritis, Osteoarthritis Rheumatoid Arthritis, Osteoarthritis Date Acquired: 01/22/2020 01/21/2020 N/A Weeks of Treatment: 1  1 N/A Wound Status: Open Open N/A Measurements L x W x D (cm) 16x17x0.1 4x5x0.1 N/A Area (cm) : 213.628 15.708 N/A Volume (cm) : 21.363 1.571 N/A % Reduction in Area: 32.00% -128.60% N/A % Reduction in Volume: 32.00% -128.70% N/A Classification: Unclassifiable Full Thickness Without Exposed N/A Support Structures Exudate Amount: Medium Medium N/A Exudate Type: Serous Serosanguineous N/A Exudate Color: amber red, brown N/A Granulation Amount: None Present (0%) Small (1-33%) N/A Granulation Quality: N/A Red N/A Necrotic Amount: None Present (0%) Large (67-100%) N/A Exposed Structures: Fat Layer (Subcutaneous Tissue) Fat Layer (Subcutaneous Tissue) N/A Exposed: Yes Exposed: Yes Fascia: No Tendon: No Muscle: No Joint: No Bone: No Epithelialization: None None N/A Debridement: N/A Debridement - Excisional N/A Pre-procedure Verification/Time N/A 15:44 N/A Out Taken: Pain Control: N/A Lidocaine N/A Tissue Debrided: N/A Subcutaneous, Slough N/A Level: N/A Skin/Subcutaneous Tissue N/A Debridement Area (sq cm): N/A 20 N/A Hessler, Amarionna M. ([id number]) Instrument: N/A Curette N/A Bleeding: N/A Moderate N/A Hemostasis Achieved: N/A Pressure  N/A Debridement Treatment N/A Procedure was tolerated well N/A Response: Post Debridement Measurements N/A 4x5x0.1 N/A L x W x D (cm) Post Debridement Volume: (cm) N/A 1.571 N/A Procedures Performed: N/A Debridement N/A Treatment Notes Wound #4 (Right, Anterior Lower Leg) Notes Sivercell 3 layer bilateral Wound #5 (Left, Posterior Lower Leg) Notes Sivercell 3 layer bilateral Electronic Signature(s) Signed: 02/02/2020 7:42:01 AM By: Linton Ham MD Entered By: Linton Ham on 02/01/2020 16:45:54 [name] [name], [name] [name] ([id number]) -------------------------------------------------------------------------------- Multi-Disciplinary Care Plan Details Patient Name: [name] [name] Date of Service: 02/01/2020 3:00 PM Medical Record Number: [id number] Patient Account Number: [id number] Date of Birth/Sex: Jun 21, 1941 (79 y.o. F) Treating RN: Cornell Barman Primary Care Shayanne Gomm: Salome Holmes Other Clinician: Referring Churchill Grimsley: Salome Holmes Treating Caressa Scearce/Extender: Tito Dine in Treatment: 1 Active Inactive Orientation to the Wound Care Program Nursing Diagnoses: Knowledge deficit related to the wound healing center program Goals: Patient/caregiver will verbalize understanding of the [facility] Program Date Initiated: 01/25/2020 Target Resolution Date: 02/01/2020 Goal Status: Active Interventions: Provide education on orientation to the wound center Notes: Wound/Skin Impairment Nursing Diagnoses: Impaired tissue integrity Goals: Ulcer/skin breakdown will have a volume reduction of 30% by week 4 Date Initiated: 01/25/2020 Target Resolution Date: 02/24/2020 Goal Status: Active Interventions: Assess ulceration(s) every visit Treatment Activities: Topical wound management initiated : 01/25/2020 Notes: Electronic Signature(s) Signed: 02/02/2020 5:10:21 PM By: Gretta Cool, BSN, RN, CWS, Kim RN, BSN Entered By: Gretta Cool, BSN, RN, CWS, Kim on 02/01/2020 15:42:11 [name] [name],  [name] [name] ([id number]) -------------------------------------------------------------------------------- Pain Assessment Details Patient Name: [name] [name] Date of Service: 02/01/2020 3:00 PM Medical Record Number: [id number] Patient Account Number: [id number] Date of Birth/Sex: [birth year] (79 y.o. F) Treating RN: Army Melia Primary Care Journe Hallmark: Salome Holmes Other Clinician: Referring Burech Mcfarland: Salome Holmes Treating Kenzie Flakes/Extender: Tito Dine in Treatment: 1 Active Problems Location of Pain Severity and Description of Pain Patient Has Paino No Site Locations Pain Management and Medication Current Pain Management: Electronic Signature(s) Signed: 02/01/2020 4:35:57 PM By: Army Melia Entered By: Army Melia on 02/01/2020 15:33:13 [name] [name], [name] [name] ([id number]) -------------------------------------------------------------------------------- Patient/Caregiver Education Details Patient Name: [name] [name] Date of Service: 02/01/2020 3:00 PM Medical Record Number: [id number] Patient Account Number: [id number] Date of Birth/Gender: 11/22/1940 (79 y.o. F) Treating RN: Cornell Barman Primary Care Physician: Salome Holmes Other Clinician: Referring Physician: Salome Holmes Treating Physician/Extender: Tito Dine in Treatment: 1 Education Assessment Education Provided To: Patient Education Topics Provided Venous: Handouts: Controlling Swelling with Multilayered Compression Wraps Methods: Demonstration, Explain/Verbal Responses:  State content correctly Wound Debridement: Handouts: Wound Debridement Methods: Demonstration, Explain/Verbal Responses: State content correctly Electronic Signature(s) Signed: 02/02/2020 5:10:21 PM By: Gretta Cool, BSN, RN, CWS, Kim RN, BSN Entered By: Gretta Cool, BSN, RN, CWS, Kim on 02/01/2020 15:48:10 [name] [name], [name] [name] ([id number]) -------------------------------------------------------------------------------- Wound Assessment  Details Patient Name: [name] [name] Date of Service: 02/01/2020 3:00 PM Medical Record Number: [id number] Patient Account Number: [id number] Date of Birth/Sex: 1941/10/11 (79 y.o. F) Treating RN: Army Melia Primary Care Abbegale Stehle: Salome Holmes Other Clinician: Referring Vesta Wheeland: Salome Holmes Treating Orlando Thalmann/Extender: Tito Dine in Treatment: 1 Wound Status Wound Number: 4 Primary Venous Leg Ulcer Etiology: Wound Location: Right, Anterior Lower Leg Wound Open Wounding Event: Blister Status: Date Acquired: 01/22/2020 Comorbid Chronic sinus problems/congestion, Anemia, Lymphedema, Weeks Of Treatment: 1 History: Angina, Arrhythmia, Hypertension, Gout, Rheumatoid Clustered Wound: No Arthritis, Osteoarthritis Photos Wound Measurements Length: (cm) 16 Width: (cm) 17 Depth: (cm) 0.1 Area: (cm) 213.628 Volume: (cm) 21.363 % Reduction in Area: 32% % Reduction in Volume: 32% Epithelialization: None Wound Description Classification: Unclassifiable Exudate Amount: Medium Exudate Type: Serous Exudate Color: amber Foul Odor After Cleansing: No Slough/Fibrino Yes Wound Bed Granulation Amount: None Present (0%) Exposed Structure Necrotic Amount: None Present (0%) Fascia Exposed: No Fat Layer (Subcutaneous Tissue) Exposed: Yes Tendon Exposed: No Muscle Exposed: No Joint Exposed: No Bone Exposed: No Treatment Notes Wound #4 (Right, Anterior Lower Leg) Notes Sivercell 3 layer bilateral Electronic Signature(s) Signed: 02/01/2020 4:35:57 PM By: Nils Flack, Lacye Jerilynn Mages ([id number]) Entered By: Army Melia on 02/01/2020 15:34:33 [name] [name], [name] [name] ([id number]) -------------------------------------------------------------------------------- Wound Assessment Details Patient Name: [name] [name] Date of Service: 02/01/2020 3:00 PM Medical Record Number: [id number] Patient Account Number: [id number] Date of Birth/Sex: 1941-09-18 (79 y.o. F) Treating RN: Army Melia Primary Care Nevena Rozenberg: Salome Holmes Other Clinician: Referring Ravynn Hogate: Salome Holmes Treating Ejay Lashley/Extender: Tito Dine in Treatment: 1 Wound Status Wound Number: 5 Primary Venous Leg Ulcer Etiology: Wound Location: Left, Posterior Lower Leg Wound Open Wounding Event: Gradually Appeared Status: Date Acquired: 01/21/2020 Comorbid Chronic sinus problems/congestion, Anemia, Lymphedema, Weeks Of Treatment: 1 History: Angina, Arrhythmia, Hypertension, Gout, Rheumatoid Clustered Wound: No Arthritis, Osteoarthritis Photos Wound Measurements Length: (cm) 4 Width: (cm) 5 Depth: (cm) 0.1 Area: (cm) 15.708 Volume: (cm) 1.571 % Reduction in Area: -128.6% % Reduction in Volume: -128.7% Epithelialization: None Wound Description Classification: Full Thickness Without Exposed Support Structures Exudate Amount: Medium Exudate Type: Serosanguineous Exudate Color: red, brown Foul Odor After Cleansing: No Wound Bed Granulation Amount: Small (1-33%) Exposed Structure Granulation Quality: Red Fat Layer (Subcutaneous Tissue) Exposed: Yes Necrotic Amount: Large (67-100%) Necrotic Quality: Adherent Slough Treatment Notes Wound #5 (Left, Posterior Lower Leg) Notes Sivercell 3 layer bilateral Electronic Signature(s) Signed: 02/01/2020 4:35:57 PM By: Army Melia Entered By: Army Melia on 02/01/2020 15:34:53 [name] [name], [name] [name] ([id number]) -------------------------------------------------------------------------------- Vitals Details Patient Name: [name] [name] Date of Service: 02/01/2020 3:00 PM Medical Record Number: [id number] Patient Account Number: [id number] Date of Birth/Sex: 1941-10-06 (79 y.o. F) Treating RN: Cornell Barman Primary Care Charisse Wendell: Salome Holmes Other Clinician: Referring Evangelene Vora: Salome Holmes Treating Christionna Poland/Extender: Tito Dine in Treatment: 1 Vital Signs Time Taken: 15:08 Temperature (F): 98.1 Height (in):  65 Pulse (bpm): 72 Weight (lbs): 140 Respiratory Rate (breaths/min): 18 Body Mass Index (BMI): 23.3 Blood Pressure (mmHg): 139/91 Reference Range: 80 - 120 mg / dl Electronic Signature(s) Signed: 02/01/2020 4:35:46 PM By: Lorine Bears RCP, RRT, CHT Entered By: Lorine Bears on 02/01/2020 15:10:35

## 2020-02-08 ENCOUNTER — Encounter: Payer: Medicare (Managed Care) | Admitting: Internal Medicine

## 2020-02-08 ENCOUNTER — Other Ambulatory Visit: Payer: Self-pay

## 2020-02-08 DIAGNOSIS — L97811 Non-pressure chronic ulcer of other part of right lower leg limited to breakdown of skin: Secondary | ICD-10-CM | POA: Diagnosis not present

## 2020-02-09 NOTE — Progress Notes (Signed)
Kristina, [name] ([id number]) Visit Report for 02/08/2020 Arrival Information Details Patient Name: Kristina, [name] Date of Service: 02/08/2020 10:15 AM Medical Record Number: [id number] Patient Account Number: [id number] Date of Birth/Sex: Jun 16, 1941 (78 y.o. F) Treating RN: Montey Hora Primary Care Yogesh Cominsky: Salome Holmes Other Clinician: Referring Alyxandra Tenbrink: Salome Holmes Treating Arcola Freshour/Extender: Tito Dine in Treatment: 2 Visit Information History Since Last Visit Added or deleted any medications: No Patient Arrived: Cane Any new allergies or adverse reactions: No Arrival Time: 10:11 Had a fall or experienced change in No Accompanied By: self activities of daily living that may affect Transfer Assistance: None risk of falls: Patient Identification Verified: Yes Signs or symptoms of abuse/neglect since last visito No Secondary Verification Process Completed: Yes Hospitalized since last visit: No Patient Has Alerts: Yes Implantable device outside of the clinic excluding No Patient Alerts: 06/02/19 ABI cellular tissue based products placed in the center L 0.84 R 0.82 since last visit: Has Dressing in Place as Prescribed: Yes Has Compression in Place as Prescribed: Yes Pain Present Now: No Electronic Signature(s) Signed: 02/08/2020 4:34:20 PM By: Montey Hora Entered By: Montey Hora on 02/08/2020 10:12:03 [name], [name] ([id number]) -------------------------------------------------------------------------------- Compression Therapy Details Patient Name: [name] Date of Service: 02/08/2020 10:15 AM Medical Record Number: [id number] Patient Account Number: [id number] Date of Birth/Sex: 1941-02-06 (78 y.o. F) Treating RN: Cornell Barman Primary Care Deloris Moger: Salome Holmes Other Clinician: Referring Elvie Maines: Salome Holmes Treating Dreyah Montrose/Extender: Tito Dine in Treatment: 2 Compression Therapy Performed for Wound Assessment: Wound #4  Right,Anterior Lower Leg Performed By: Clinician Cornell Barman, RN Compression Type: Three Layer Pre Treatment ABI: 0.8 Post Procedure Diagnosis Same as Pre-procedure Electronic Signature(s) Signed: 02/08/2020 4:39:52 PM By: Gretta Cool, BSN, RN, CWS, Kim RN, BSN Entered By: Gretta Cool, BSN, RN, CWS, Kim on 02/08/2020 10:42:33 [name], [name] ([id number]) -------------------------------------------------------------------------------- Compression Therapy Details Patient Name: [name] Date of Service: 02/08/2020 10:15 AM Medical Record Number: [id number] Patient Account Number: [id number] Date of Birth/Sex: 1941-05-03 (78 y.o. F) Treating RN: Cornell Barman Primary Care Renad Jenniges: Salome Holmes Other Clinician: Referring Ceara Wrightson: Salome Holmes Treating Coltrane Tugwell/Extender: Tito Dine in Treatment: 2 Compression Therapy Performed for Wound Assessment: Wound #5 Left,Posterior Lower Leg Performed By: Clinician Cornell Barman, RN Compression Type: Three Layer Pre Treatment ABI: 0.8 Post Procedure Diagnosis Same as Pre-procedure Electronic Signature(s) Signed: 02/08/2020 4:39:52 PM By: Gretta Cool, BSN, RN, CWS, Kim RN, BSN Entered By: Gretta Cool, BSN, RN, CWS, Kim on 02/08/2020 10:42:52 [name], [name] ([id number]) -------------------------------------------------------------------------------- Encounter Discharge Information Details Patient Name: [name] Date of Service: 02/08/2020 10:15 AM Medical Record Number: [id number] Patient Account Number: [id number] Date of Birth/Sex: 04-06-1941 (78 y.o. F) Treating RN: Cornell Barman Primary Care Lennyn Gange: Salome Holmes Other Clinician: Referring Tristain Daily: Salome Holmes Treating Shanine Kreiger/Extender: Tito Dine in Treatment: 2 Encounter Discharge Information Items Discharge Condition: Stable Ambulatory Status: Ambulatory Discharge Destination: Home Transportation: Private Auto Accompanied By: self Schedule Follow-up Appointment:  Yes Clinical Summary of Care: Electronic Signature(s) Signed: 02/08/2020 4:39:52 PM By: Gretta Cool, BSN, RN, CWS, Kim RN, BSN Entered By: Gretta Cool, BSN, RN, CWS, Kim on 02/08/2020 10:44:21 [name], [name] ([id number]) -------------------------------------------------------------------------------- Lower Extremity Assessment Details Patient Name: [name] Date of Service: 02/08/2020 10:15 AM Medical Record Number: [id number] Patient Account Number: [id number] Date of Birth/Sex: June 03, 1941 (78 y.o. F) Treating RN: Montey Hora Primary Care Dnaiel Voller: Salome Holmes Other Clinician: Referring Halton Neas: Salome Holmes Treating Aylla Huffine/Extender: Tito Dine in Treatment: 2 Edema Assessment Assessed: [Left: No] [  Right: No] Edema: [Left: Yes] [Right: Yes] Calf Left: Right: Point of Measurement: 35 cm From Medial Instep 41.5 cm 38 cm Ankle Left: Right: Point of Measurement: 11 cm From Medial Instep 33 cm 30.5 cm Vascular Assessment Pulses: Dorsalis Pedis Palpable: [Left:Yes] [Right:Yes] Electronic Signature(s) Signed: 02/08/2020 4:34:20 PM By: Montey Hora Entered By: Montey Hora on 02/08/2020 10:20:05 [name], [name] ([id number]) -------------------------------------------------------------------------------- Multi Wound Chart Details Patient Name: [name] Date of Service: 02/08/2020 10:15 AM Medical Record Number: [id number] Patient Account Number: [id number] Date of Birth/Sex: 03-Apr-1941 (79 y.o. 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] [5:Venous Leg Ulcer] [N/A:N/A] Comorbid History: [4:Chronic sinus problems/congestion, Anemia, Lymphedema, Angina, Arrhythmia, Hypertension, Gout, Rheumatoid Arthritis, Osteoarthritis] [5:Chronic sinus problems/congestion, Anemia, Lymphedema, Angina, Arrhythmia, Hypertension, Gout,  Rheumatoid Arthritis, Osteoarthritis] [N/A:N/A] Date Acquired: [4:01/22/2020] [5:01/21/2020] [N/A:N/A] Weeks of Treatment: [4:2] [5:2] [N/A:N/A] Wound Status: [4:Open] [5:Open] [N/A:N/A] Measurements L x W x D (cm) [4:12x14x0.1] [5:4.5x5.2x0.1] [N/A:N/A] Area (cm) : [4:131.947] [5:18.378] [N/A:N/A] Volume (cm) : [4:13.195] [5:1.838] [N/A:N/A] % Reduction in Area: [4:58.00%] [5:-167.40%] [N/A:N/A] % Reduction in Volume: [4:58.00%] [5:-167.50%] [N/A:N/A] Classification: [4:Full Thickness Without Exposed Support Structures] [5:Full Thickness Without Exposed Support Structures] [N/A:N/A] Exudate Amount: [4:Large] [5:Medium] [N/A:N/A] Exudate Type: [4:Serous] [5:Serosanguineous] [N/A:N/A] Exudate Color: [4:amber] [5:red, brown] [N/A:N/A] Granulation Amount: [4:Large (67-100%)] [5:Large (67-100%)] [N/A:N/A] Granulation Quality: [4:Pink] [5:Red] [N/A:N/A] Necrotic Amount: [4:Small (1-33%)] [5:Small (1-33%)] [N/A:N/A] Exposed Structures: [4:Fat Layer (Subcutaneous Tissue) Exposed: Yes Fascia: No Tendon: No Muscle: No Joint: No Bone: No] [5:Fat Layer (Subcutaneous Tissue) Exposed: Yes] [N/A:N/A] Epithelialization: [4:Small (1-33%) Compression Therapy] [5:Large (67-100%) Compression Therapy] [N/A:N/A N/A] Treatment Notes Wound #4 (Right, Anterior Lower Leg) Notes Sivercell 3 layer bilateral Wound #5 (Left, Posterior Lower Leg) Notes Sivercell 3 layer bilateral Electronic Signature(s) VANIDA, CHAVOLLA ([id number]) Signed: 02/08/2020 4:22:42 PM By: Linton Ham MD Entered By: Linton Ham on 02/08/2020 12:16:51 [place], [name] ([id number]) -------------------------------------------------------------------------------- [place] Details Patient Name: [name] Date of Service: 02/08/2020 10:15 AM Medical Record Number: [id number] Patient Account Number: [id number] Date of Birth/Sex: 1941/09/06 (78 y.o. F) Treating RN: Cornell Barman Primary Care Roshan Salamon: Salome Holmes Other Clinician: Referring Boyd Litaker: Salome Holmes Treating Kelcy Baeten/Extender: Tito Dine in Treatment: 2 Active Inactive Orientation to the Wound Care Program Nursing Diagnoses: Knowledge deficit related to the wound healing center program Goals: Patient/caregiver will verbalize understanding of the [name] Program Date Initiated: 01/25/2020 Target Resolution Date: 02/01/2020 Goal Status: Active Interventions: Provide education on orientation to the wound center Notes: Wound/Skin Impairment Nursing Diagnoses: Impaired tissue integrity Goals: Ulcer/skin breakdown will have a volume reduction of 30% by week 4 Date Initiated: 01/25/2020 Target Resolution Date: 02/24/2020 Goal Status: Active Interventions: Assess ulceration(s) every visit Treatment Activities: Topical wound management initiated : 01/25/2020 Notes: Electronic Signature(s) Signed: 02/08/2020 4:39:52 PM By: Gretta Cool, BSN, RN, CWS, Kim RN, BSN Entered By: Gretta Cool, BSN, RN, CWS, Kim on 02/08/2020 10:41:12 Nicholson, [name] ([id number]) -------------------------------------------------------------------------------- Pain Assessment Details Patient Name: [name] Date of Service: 02/08/2020 10:15 AM Medical Record Number: [id number] Patient Account Number: [id number] Date of Birth/Sex: January 19, 1941 (78 y.o. F) Treating RN: Montey Hora Primary  Care Vasilios Ottaway: Salome Holmes Other Clinician: Referring Minsa Weddington: Salome Holmes Treating Gad Aymond/Extender: Tito Dine in  Treatment: 2 Active Problems Location of Pain Severity and Description of Pain Patient Has Paino No Site Locations Pain Management and Medication Current Pain Management: Electronic Signature(s) Signed: 02/08/2020 4:34:20 PM By: Montey Hora Entered By: Montey Hora on 02/08/2020 10:13:06 Zumstein, [name] ([id number]) -------------------------------------------------------------------------------- Patient/Caregiver Education Details Patient Name: [name] Date of Service: 02/08/2020 10:15 AM Medical Record Number: [id number] Patient Account Number: [id number] Date of Birth/Gender: April 02, 1941 (78 y.o. F) Treating RN: Cornell Barman Primary Care Physician: Salome Holmes Other Clinician: Referring Physician: Salome Holmes Treating Physician/Extender: Tito Dine in Treatment: 2 Education Assessment Education Provided To: Patient Education Topics Provided Venous: Handouts: Controlling Swelling with Multilayered Compression Wraps Methods: Demonstration, Explain/Verbal Responses: State content correctly Electronic Signature(s) Signed: 02/08/2020 4:39:52 PM By: Gretta Cool, BSN, RN, CWS, Kim RN, BSN Entered By: Gretta Cool, BSN, RN, CWS, Kim on 02/08/2020 10:44:42 Millikan, [name] ([id number]) -------------------------------------------------------------------------------- Wound Assessment Details Patient Name: [name] Date of Service: 02/08/2020 10:15 AM Medical Record Number: [id number] Patient Account Number: [id number] Date of Birth/Sex: [birth year] (78 y.o. F) Treating RN: Montey Hora Primary Care Earvin Blazier: Salome Holmes Other Clinician: Referring Iveth Heidemann: Salome Holmes Treating Babatunde Seago/Extender: Tito Dine in Treatment: 2 Wound Status Wound Number: 4 Primary Venous Leg Ulcer Etiology: Wound Location: Right, Anterior Lower Leg Wound Open Wounding Event: Blister Status: Date Acquired: 01/22/2020 Comorbid Chronic sinus problems/congestion,  Anemia, Lymphedema, Weeks Of Treatment: 2 History: Angina, Arrhythmia, Hypertension, Gout, Rheumatoid Clustered Wound: No Arthritis, Osteoarthritis Photos Photo Uploaded By: Gretta Cool, BSN, RN, CWS, Kim on 02/08/2020 15:43:47 Wound Measurements Length: (cm) 12 Width: (cm) 14 Depth: (cm) 0.1 Area: (cm) 131.947 Volume: (cm) 13.195 % Reduction in Area: 58% % Reduction in Volume: 58% Epithelialization: Small (1-33%) Tunneling: No Undermining: No Wound Description Classification: Full Thickness Without Exposed Support Struc Exudate Amount: Large Exudate Type: Serous Exudate Color: amber tures Foul Odor After Cleansing: No Slough/Fibrino Yes Wound Bed Granulation Amount: Large (67-100%) Exposed Structure Granulation Quality: Pink Fascia Exposed: No Necrotic Amount: Small (1-33%) Fat Layer (Subcutaneous Tissue) Exposed: Yes Necrotic Quality: Adherent Slough Tendon Exposed: No Muscle Exposed: No Joint Exposed: No Bone Exposed: No Treatment Notes Wound #4 (Right, Anterior Lower Leg) Notes Sivercell 3 layer bilateral Electronic Signature(s) Kristina, [name] ([id number]) Signed: 02/08/2020 4:34:20 PM By: Montey Hora Entered By: Montey Hora on 02/08/2020 10:26:34 Folger, [name] ([id number]) -------------------------------------------------------------------------------- Wound Assessment Details Patient Name: [name] Date of Service: 02/08/2020 10:15 AM Medical Record Number: [id number] Patient Account Number: [id number] Date of Birth/Sex: 30-Jun-1941 (78 y.o. F) Treating RN: Montey Hora Primary Care Taichi Repka: Salome Holmes Other Clinician: Referring Jeriann Sayres: Salome Holmes Treating Benaiah Behan/Extender: Tito Dine in Treatment: 2 Wound Status Wound Number: 5 Primary Venous Leg Ulcer Etiology: Wound Location: Left, Posterior Lower Leg Wound Open Wounding Event: Gradually Appeared Status: Date Acquired: 01/21/2020 Comorbid Chronic sinus  problems/congestion, Anemia, Lymphedema, Weeks Of Treatment: 2 History: Angina, Arrhythmia, Hypertension, Gout, Rheumatoid Clustered Wound: No Arthritis, Osteoarthritis Photos Photo Uploaded By: Gretta Cool, BSN, RN, CWS, Kim on 02/08/2020 15:44:01 Wound Measurements Length: (cm) 4.5 Width: (cm) 5.2 Depth: (cm) 0.1 Area: (cm) 18.378 Volume: (cm) 1.838 % Reduction in Area: -167.4% % Reduction in Volume: -167.5% Epithelialization: Large (67-100%) Tunneling: No Undermining: No Wound Description Classification: Full Thickness Without Exposed Support Struc Exudate Amount: Medium Exudate Type: Serosanguineous Exudate Color: red, brown tures Foul Odor After Cleansing: No Wound Bed Granulation Amount: Large (67-100%) Exposed Structure  Granulation Quality: Red Fat Layer (Subcutaneous Tissue) Exposed: Yes Necrotic Amount: Small (1-33%) Necrotic Quality: Adherent Slough Treatment Notes Wound #5 (Left, Posterior Lower Leg) Notes Sivercell 3 layer bilateral Electronic Signature(s) Signed: 02/08/2020 4:34:20 PM By: Montey Hora Entered By: Montey Hora on 02/08/2020 10:26:57 Saal, [name] ([id number]) [name], Kristina M. ([id number]) -------------------------------------------------------------------------------- Vitals Details Patient Name: [name] Date of Service: 02/08/2020 10:15 AM Medical Record Number: [id number] Patient Account Number: [id number] Date of Birth/Sex: 1941-03-13 (79 y.o. F) Treating RN: Montey Hora Primary Care Keevon Henney: Salome Holmes Other Clinician: Referring Reianna Batdorf: Salome Holmes Treating Nomi Rudnicki/Extender: Tito Dine in Treatment: 2 Vital Signs Time Taken: 10:12 Temperature (F): 97.4 Height (in): 65 Pulse (bpm): 68 Weight (lbs): 140 Respiratory Rate (breaths/min): 18 Body Mass Index (BMI): 23.3 Blood Pressure (mmHg): 187/73 Reference Range: 80 - 120 mg / dl Electronic Signature(s) Signed: 02/08/2020 4:34:20 PM By: Montey Hora Entered By: Montey Hora on 02/08/2020 10:12:26

## 2020-02-09 NOTE — Progress Notes (Signed)
KYLEENA, DAZA ([id number]) Visit Report for 02/08/2020 HPI Details Patient Name: [name], [name] Date of Service: 02/08/2020 10:15 AM Medical Record Number: [id number] Patient Account Number: [id number] Date of Birth/Sex: 1941/05/03 (79 y.o. F) Treating RN: Cornell Barman Primary Care Provider: Salome Holmes Other Clinician: Referring Provider: Salome Holmes Treating Provider/Extender: Tito Dine in Treatment: 2 History of Present Illness HPI Description: 03/05/18 on evaluation today patient presents for initial evaluation and our clinic due to significantly large blisters of the bilateral lower extremities which began initially on 02/18/18. 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 Secondarily it does appear that they felt she likely had cellulitis. Subsequently the patient was seen for fault evaluation on 03/03/18 and that is really when the cellulitis was diagnosed. She was actually placed on doxycycline 100 mg by mouth twice a day for 10 days. With that being said she was prescribed prednisone although she did not tolerate this. Apparently she had some alteration in her mental status with taking this. 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 She has never smoked and unfortunately does have pain at the site where these blisters are located of the bilateral lower extremities. She also does appear to have significant lymphedema which obviously has been more chronic. 03/12/18 on evaluation today patient actually appears to be doing much better in regard to her bilateral [employer] ulcers. Unfortunately a lot of the alginate dressing did stick to her legs due to the fact that everything dried up rather rapidly in the compression seems to have done very well in that regard. Unfortunately the home health nurse last time she came  out to the patient's home was not able to fully remove the alginate from her leg so she just rewrapped it with what was there continuing to be in place. Obviously that does need to come off but overall I'm extremely pleased with the progress it's been made over the past week which is the Kerlex and Coban wraps. 03/18/18-She is here in follow-up evaluation for bilateral lower extremity lymphedema with blister/ulceration. She is essentially healed to the right lower extremity with one residual pinpoint opening, the left lower extremity has multiple pinpoint openings with minimal amount of serous drainage. We will continue with compression over this next week. We have requested that home help initiate the process of ordering compression wraps for long-term management. She will follow-up next week, I anticipate she will be healed at that appointment 03/26/18 on evaluation today patient appears to be doing extremely well in regard to her bilateral lower extremity ulcers. This has completely resolved for the most part on the left lower extremity and has completely resolved in regard to the right lower extremity. She's been tolerating the dressing changes without complication including the wraps. Overall her swelling is down significantly and I think this accounts for why she is doing so much better at this time. Overall she is very pleased and having very little discomfort compared to previous. 04/02/18 on evaluation today patient actually appears to be completely healed which is excellent news. She has been tolerating the wraps without complication obviously this has done very well for her. In general she shows no signs of infection or otherwise complicating factors. She's having no pain. Readmission: 06/02/2019 on evaluation today patient appears to be unfortunately experiencing issues with increased swelling of her bilateral  lower extremities. I have last seen her in June 2019. At that time everything healed  and it does appear that we actually got her some Velcro compression at that time. Nonetheless she states she was wearing that until about 5 weeks ago and then subsequently she quit wearing it she tells me the only excuse that she can give is "laziness". Nonetheless she is not able to get any compression sleeves/stockings on. she tells me currently that she is not experiencing any evidence of infection at this time she is been having some leaking intermittently from her lower extremities right now the left on the lateral portion is the only area open. No fevers, chills, nausea, vomiting, or diarrhea. 06/09/2019 on evaluation today patient actually appears to be doing quite well with regard to her leg ulcers in fact everything appears to be completely closed at this time bilaterally. There is no signs of active infection at this time which is good news. Overall I am very pleased. She did bring her Velcro compression wraps with her today. READMISSION 01/25/2020 This is a now 79 year old woman who is been in this clinic for a prolonged period of time in 2019 and more recently in 2020 with bilateral lower extremity blisters and wounds in the setting of chronic lymphedema. She is generally fairly noncompliant with compression. When she was here in 2020 she was discharged with bilateral zippered stockings but she tells as these stopped working and she has not been wearing anything for a long period of time. She came in today after developing a substantial superficial blister on the right anterior and lateral lower leg which she said started on Sunday. She has not had any pain. As noted she is not wearing compression stockings. Past medical history includes chronic myelocytic leukemia presumably in remission although I did not really see anything about this on [place] link, coronary artery disease, hypertension, lupus on hydroxychloroquine, stage III chronic renal failure with an estimated GFR of 39  based on lab work in February of this year. We did not do her ABIs today although most recently in 2020 these were 0.84 and 0.82 right and left respectively 4/21; substantial wound on the right lateral lower leg which was a humongous blister last week. She also has an area on the right lateral calf. We put her in compression with silver alginate. 4/28; substantial wound on the right lateral lower leg is epithelializing nicely. The area on the right lateral posterior calf has fully epithelialized today. We have been using silver alginate under compression. HYUN, RANDT ([id number]) Electronic Signature(s) Signed: 02/08/2020 4:22:42 PM By: Linton Ham MD Entered By: Linton Ham on 02/08/2020 12:18:09 SWIFTKymara, Ruen ([id number]) -------------------------------------------------------------------------------- Physical Exam Details Patient Name: [name] Date of Service: 02/08/2020 10:15 AM Medical Record Number: [id number] Patient Account Number: [id number] Date of Birth/Sex: 04-27-1941 (79 y.o. F) Treating RN: Cornell Barman Primary Care Provider: Salome Holmes Other Clinician: Referring Provider: Salome Holmes Treating Provider/Extender: Tito Dine in Treatment: 2 Constitutional Patient is hypertensive.. Pulse regular and within target range for patient.Marland Kitchen Respirations regular, non-labored and within target range.. Temperature is normal and within the target range for the patient.Marland Kitchen appears in no distress. Cardiovascular Pedal pulses are palpable. Bilateral changes of chronic venous insufficiency with secondary lymphedema. Notes Wound exam; massive blister on the right anterior lower leg is epithelializing nicely the area on the left lateral lower leg is fully epithelialized. Electronic Signature(s) Signed: 02/08/2020 4:22:42 PM By: Linton Ham MD Entered By: Dellia Nims,  Tamari Busic on 02/08/2020 12:19:38 Jost, [name]  ([id number]) -------------------------------------------------------------------------------- Physician Orders Details Patient Name: [name] Date of Service: 02/08/2020 10:15 AM Medical Record Number: [id number] Patient Account Number: [id number] Date of Birth/Sex: 1941/09/16 (79 y.o. F) Treating RN: Cornell Barman Primary Care Provider: Salome Holmes Other Clinician: Referring Provider: Salome Holmes Treating Provider/Extender: Tito Dine in Treatment: 2 Verbal / Phone Orders: No Diagnosis Coding Wound Cleansing Wound #4 Right,Anterior Lower Leg o Clean wound with Normal Saline. Wound #5 Left,Posterior Lower Leg o Clean wound with Normal Saline. Skin Barriers/Peri-Wound Care Wound #5 Left,Posterior Lower Leg o Barrier cream Primary Wound Dressing Wound #4 Right,Anterior Lower Leg o Silver Alginate Wound #5 Left,Posterior Lower Leg o Silver Alginate Secondary Dressing Wound #4 Right,Anterior Lower Leg o XtraSorb Wound #5 Left,Posterior Lower Leg o ABD pad Dressing Change Frequency Wound #4 Right,Anterior Lower Leg o Change dressing every week Wound #5 Left,Posterior Lower Leg o Change dressing every week Follow-up Appointments Wound #4 Right,Anterior Lower Leg o Return Appointment in 1 week. o Nurse Visit as needed Wound #5 Left,Posterior Lower Leg o Return Appointment in 1 week. o Nurse Visit as needed Edema Control Wound #4 Right,Anterior Lower Leg o 3 Layer Compression System - Bilateral o Elevate legs to the level of the heart and pump ankles as often as possible Wound #5 Left,Posterior Lower Leg o 3 Layer Compression System - Bilateral o Elevate legs to the level of the heart and pump ankles as often as possible Electronic Signature(s) Signed: 02/08/2020 4:22:42 PM By: Linton Ham MD Signed: 02/08/2020 4:39:52 PM By: Gretta Cool BSN, RN, CWS, Kim RN, BSN Kiester, Sarita M. ([id number]) Entered By: Gretta Cool, BSN, RN,  CWS, Kim on 02/08/2020 10:43:25 Lancour, [name] ([id number]) -------------------------------------------------------------------------------- Problem List Details Patient Name: [name] Date of Service: 02/08/2020 10:15 AM Medical Record Number: [id number] Patient Account Number: [id number] Date of Birth/Sex: January 26, 1941 (79 y.o. 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 F) Treating RN: Cornell Barman Primary Care Provider: Salome Holmes Other Clinician: Referring Provider: Salome Holmes Treating Provider/Extender: Tito Dine in Treatment: 2 Subjective History of Present Illness (HPI) 03/05/18 on evaluation today patient presents for initial evaluation and our clinic due to significantly large blisters of the bilateral lower extremities which began initially on 02/18/18. 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 Secondarily it  does appear that they felt she likely had cellulitis. Subsequently the patient was seen for fault evaluation on 03/03/18 and that is really when the cellulitis was diagnosed. She was actually placed on doxycycline 100 mg by mouth twice a day for 10 days. With that being said she was prescribed prednisone although she did not tolerate this. Apparently  she had some alteration in her mental status with taking this. 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 She has never smoked and unfortunately does have pain at the site where these blisters are located of the bilateral lower extremities. She also does appear to have significant lymphedema which obviously has been more chronic. 03/12/18 on evaluation today patient actually appears to be doing much better in regard to her bilateral [employer] ulcers. Unfortunately a lot of the alginate dressing did stick to her legs due to the fact that everything dried up rather rapidly in the compression seems to have done very well in that regard. Unfortunately the home health nurse last time she came out to the patient's home was not able to fully remove the alginate from her leg so she just rewrapped it with what was there continuing to be in place. Obviously that does need to come off but overall I'm extremely pleased with the progress it's been made over the past week which is the Kerlex and Coban wraps. 03/18/18-She is here in follow-up evaluation for bilateral lower extremity lymphedema with blister/ulceration. She is essentially healed to the right lower extremity with one residual pinpoint opening, the left lower extremity has multiple pinpoint openings with minimal amount of serous drainage. We will continue with compression over this next week. We have requested that home help initiate the process of ordering compression wraps for long-term management. She will follow-up next week, I anticipate she  will be healed at that appointment 03/26/18 on evaluation today patient appears to be doing extremely well in regard to her bilateral lower extremity ulcers. This has completely resolved for the most part on the left lower extremity and has completely resolved in regard to the right lower extremity. She's been tolerating the dressing changes without complication including the wraps. Overall her swelling is down significantly and I think this accounts for why she is doing so much better at this time. Overall she is very pleased and having very little discomfort compared to previous. 04/02/18 on evaluation today patient actually appears to be completely healed which is excellent news. She has been tolerating the wraps without complication obviously this has done very well for her. In general she shows no signs of infection or otherwise complicating factors. She's having no pain. Readmission: 06/02/2019 on evaluation today patient appears to be unfortunately experiencing issues with increased swelling of her bilateral lower extremities. I have last seen her in June 2019. At that time everything healed and it does appear that we actually got her some Velcro compression at that time. Nonetheless she states she was wearing that until about 5 weeks ago and then subsequently she quit wearing it she tells me the only excuse that she can give is "laziness". Nonetheless she is not able to get any compression sleeves/stockings on. she tells me currently that she is not experiencing any evidence of infection at this time she is been having some leaking intermittently from her lower extremities right now the left on the lateral portion is the only area open. No fevers, chills, nausea, vomiting, or diarrhea. 06/09/2019 on evaluation today patient actually appears to be doing quite well with regard to her leg ulcers in fact everything appears to be completely closed at this time bilaterally. There is no signs of active  infection at this time which is good news. Overall I am very pleased. She did bring her Velcro compression wraps with her  today. READMISSION 01/25/2020 This is a now 79 year old woman who is been in this clinic for a prolonged period of time in 2019 and more recently in 2020 with bilateral lower extremity blisters and wounds in the setting of chronic lymphedema. She is generally fairly noncompliant with compression. When she was here in 2020 she was discharged with bilateral zippered stockings but she tells as these stopped working and she has not been wearing anything for a long period of time. She came in today after developing a substantial superficial blister on the right anterior and lateral lower leg which she said started on Sunday. She has not had any pain. As noted she is not wearing compression stockings. Past medical history includes chronic myelocytic leukemia presumably in remission although I did not really see anything about this on [place] link, coronary artery disease, hypertension, lupus on hydroxychloroquine, stage III chronic renal failure with an estimated GFR of 39 based on lab work in February of this year. We did not do her ABIs today although most recently in 2020 these were 0.84 and 0.82 right and left respectively 4/21; substantial wound on the right lateral lower leg which was a humongous blister last week. She also has an area on the right lateral calf. We put her in compression with silver alginate. 4/28; substantial wound on the right lateral lower leg is epithelializing nicely. The area on the right lateral posterior calf has fully epithelialized today. We have been using silver alginate under compression. KEILEN, DYKE ([id number]) Objective Constitutional Patient is hypertensive.. Pulse regular and within target range for patient.Marland Kitchen Respirations regular, non-labored and within target range.. Temperature is normal and within the target range for the patient.Marland Kitchen  appears in no distress. Vitals Time Taken: 10:12 AM, Height: 65 in, Weight: 140 lbs, BMI: 23.3, Temperature: 97.4 F, Pulse: 68 bpm, Respiratory Rate: 18 breaths/min, Blood Pressure: 187/73 mmHg. Cardiovascular Pedal pulses are palpable. Bilateral changes of chronic venous insufficiency with secondary lymphedema. General Notes: Wound exam; massive blister on the right anterior lower leg is epithelializing nicely the area on the left lateral lower leg is fully epithelialized. Integumentary (Hair, Skin) Wound #4 status is Open. Original cause of wound was Blister. The wound is located on the Right,Anterior Lower Leg. The wound measures 12cm length x 14cm width x 0.1cm depth; 131.947cm^2 area and 13.195cm^3 volume. There is Fat Layer (Subcutaneous Tissue) Exposed exposed. There is no tunneling or undermining noted. There is a large amount of serous drainage noted. There is large (67-100%) pink granulation within the wound bed. There is a small (1-33%) amount of necrotic tissue within the wound bed including Adherent Slough. Wound #5 status is Open. Original cause of wound was Gradually Appeared. The wound is located on the Left,Posterior Lower Leg. The wound measures 4.5cm length x 5.2cm width x 0.1cm depth; 18.378cm^2 area and 1.838cm^3 volume. There is Fat Layer (Subcutaneous Tissue) Exposed exposed. There is no tunneling or undermining noted. There is a medium amount of serosanguineous drainage noted. There is large (67- 100%) red granulation within the wound bed. There is a small (1-33%) amount of necrotic tissue within the wound bed including Adherent Slough. Assessment Active Problems ICD-10 Non-pressure chronic ulcer of other part of right lower leg limited to breakdown of skin Non-pressure chronic ulcer of left calf with other specified severity Lymphedema, not elsewhere classified Procedures Wound #4 Pre-procedure diagnosis of Wound #4 is a Venous Leg Ulcer located on the  Right,Anterior Lower Leg . There was a Three Layer Compression  Therapy Procedure with a pre-treatment ABI of 0.8 by Cornell Barman, RN. Post procedure Diagnosis Wound #4: Same as Pre-Procedure Wound #5 Pre-procedure diagnosis of Wound #5 is a Venous Leg Ulcer located on the Left,Posterior Lower Leg . There was a Three Layer Compression Therapy Procedure with a pre-treatment ABI of 0.8 by Cornell Barman, RN. Post procedure Diagnosis Wound #5: Same as Pre-Procedure Plan Wound Cleansing: Wound #4 Right,Anterior Lower Leg: Clean wound with Normal Saline. Wound #5 Left,Posterior Lower Leg: Clean wound with Normal Saline. LAJOYCE, BONITO ([id number]) Skin Barriers/Peri-Wound Care: Wound #5 Left,Posterior Lower Leg: Barrier cream Primary Wound Dressing: Wound #4 Right,Anterior Lower Leg: Silver Alginate Wound #5 Left,Posterior Lower Leg: Silver Alginate Secondary Dressing: Wound #4 Right,Anterior Lower Leg: XtraSorb Wound #5 Left,Posterior Lower Leg: ABD pad Dressing Change Frequency: Wound #4 Right,Anterior Lower Leg: Change dressing every week Wound #5 Left,Posterior Lower Leg: Change dressing every week Follow-up Appointments: Wound #4 Right,Anterior Lower Leg: Return Appointment in 1 week. Nurse Visit as needed Wound #5 Left,Posterior Lower Leg: Return Appointment in 1 week. Nurse Visit as needed Edema Control: Wound #4 Right,Anterior Lower Leg: 3 Layer Compression System - Bilateral Elevate legs to the level of the heart and pump ankles as often as possible Wound #5 Left,Posterior Lower Leg: 3 Layer Compression System - Bilateral Elevate legs to the level of the heart and pump ankles as often as possible 1. Continue with silver alginate Xtrasorb on the right/ABDs under 3 layer compression 2. On the left we are going to continue with 3 layer compression 3. As is not uncommon in this type of patient it is hard to know what type of compression stockings she has at home. She says  that we prescribed her a zippered stocking when she was last in the clinic and that the zipper broke although it is not the habit of anybody I know to prescribe super stockings. In any case we will look at what she has next week. Is likely we are going to have to order something additional probably a juxta light or something similar Electronic Signature(s) Signed: 02/08/2020 4:22:42 PM By: Linton Ham MD Entered By: Linton Ham on 02/08/2020 12:21:29 Maya, [name] ([id number]) -------------------------------------------------------------------------------- SuperBill Details Patient Name: [name] Date of Service: 02/08/2020 Medical Record Number: [id number] Patient Account Number: [id number] Date of Birth/Sex: 21-Aug-1941 (79 y.o. 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 Physician Procedures CPT4 Code: [id number] Description: [id number] - WC PHYS LEVEL 3 - EST PT Modifier: Quantity: 1 CPT4 Code: Description: ICD-10 Diagnosis Description L97.811 Non-pressure chronic ulcer of other part of right lower leg limited to bre L97.228 Non-pressure chronic ulcer of left calf with other specified severity I89.0 Lymphedema, not elsewhere classified Modifier: akdown of skin Quantity: Electronic Signature(s) Signed: 02/08/2020 4:22:42 PM By: Linton Ham MD Entered By: Linton Ham on 02/08/2020  12:21:50

## 2020-02-15 ENCOUNTER — Encounter: Payer: Medicare Other | Attending: Internal Medicine | Admitting: Internal Medicine

## 2020-02-15 ENCOUNTER — Other Ambulatory Visit: Payer: Self-pay

## 2020-02-15 ENCOUNTER — Inpatient Hospital Stay: Payer: Medicare Other | Attending: Hematology and Oncology

## 2020-02-15 DIAGNOSIS — L97228 Non-pressure chronic ulcer of left calf with other specified severity: Secondary | ICD-10-CM | POA: Diagnosis not present

## 2020-02-15 DIAGNOSIS — I872 Venous insufficiency (chronic) (peripheral): Secondary | ICD-10-CM | POA: Diagnosis not present

## 2020-02-15 DIAGNOSIS — M3214 Glomerular disease in systemic lupus erythematosus: Secondary | ICD-10-CM | POA: Insufficient documentation

## 2020-02-15 DIAGNOSIS — I890 Lymphedema, not elsewhere classified: Secondary | ICD-10-CM | POA: Insufficient documentation

## 2020-02-15 DIAGNOSIS — Z9119 Patient's noncompliance with other medical treatment and regimen: Secondary | ICD-10-CM | POA: Insufficient documentation

## 2020-02-15 DIAGNOSIS — N183 Chronic kidney disease, stage 3 unspecified: Secondary | ICD-10-CM | POA: Insufficient documentation

## 2020-02-15 DIAGNOSIS — I129 Hypertensive chronic kidney disease with stage 1 through stage 4 chronic kidney disease, or unspecified chronic kidney disease: Secondary | ICD-10-CM | POA: Diagnosis not present

## 2020-02-15 DIAGNOSIS — L97811 Non-pressure chronic ulcer of other part of right lower leg limited to breakdown of skin: Secondary | ICD-10-CM | POA: Diagnosis present

## 2020-02-15 DIAGNOSIS — M069 Rheumatoid arthritis, unspecified: Secondary | ICD-10-CM | POA: Diagnosis not present

## 2020-02-15 DIAGNOSIS — D649 Anemia, unspecified: Secondary | ICD-10-CM | POA: Insufficient documentation

## 2020-02-15 DIAGNOSIS — M109 Gout, unspecified: Secondary | ICD-10-CM | POA: Insufficient documentation

## 2020-02-15 DIAGNOSIS — C9211 Chronic myeloid leukemia, BCR/ABL-positive, in remission: Secondary | ICD-10-CM | POA: Diagnosis not present

## 2020-02-15 NOTE — Progress Notes (Addendum)
[name], [name] ([id number]) Visit Report for 02/15/2020 Arrival Information Details Patient Name: [name], [name] Date of Service: 02/15/2020 10:15 AM Medical Record Number: [id number] Patient Account Number: [id number] Date of Birth/Sex: Apr 10, 1941 (79 y.o. F) Treating RN: Army Melia Primary Care Jaivyn Gulla: Salome Holmes Other Clinician: Referring Kynesha Guerin: Salome Holmes Treating Brandelyn Henne/Extender: Tito Dine in Treatment: 3 Visit Information History Since Last Visit Added or deleted any medications: No Patient Arrived: Ambulatory Any new allergies or adverse reactions: No Arrival Time: 10:14 Had a fall or experienced change in No Accompanied By: self activities of daily living that may affect Transfer Assistance: None risk of falls: Patient Identification Verified: Yes Signs or symptoms of abuse/neglect since last visito No Patient Has Alerts: Yes Hospitalized since last visit: No Patient Alerts: 06/02/19 ABI Has Dressing in Place as Prescribed: Yes L 0.84 R 0.82 Pain Present Now: No Electronic Signature(s) Signed: 02/15/2020 10:25:17 AM By: Army Melia Entered By: Army Melia on 02/15/2020 10:14:51 [name], [name] ([id number]) -------------------------------------------------------------------------------- Compression Therapy Details Patient Name: [name] Date of Service: 02/15/2020 10:15 AM Medical Record Number: [id number] Patient Account Number: [id number] Date of Birth/Sex: [birth year] (79 y.o. F) Treating RN: Cornell Barman Primary Care Daejon [name]: Salome Holmes Other Clinician: Referring Isabella Roemmich: Salome Holmes Treating Kelisha Dall/Extender: Tito Dine in Treatment: 3 Compression Therapy Performed for Wound Assessment: Wound #4 Right,Anterior Lower Leg Performed By: Clinician Cornell Barman, RN Compression Type: Four Layer Pre Treatment ABI: 0.8 Post Procedure Diagnosis Same as Pre-procedure Electronic Signature(s) Signed: 02/15/2020 5:22:43 PM  By: Gretta Cool, BSN, RN, CWS, Kim RN, BSN Entered By: Gretta Cool, BSN, RN, CWS, Kim on 02/15/2020 10:44:50 [name], [name] ([id number]) -------------------------------------------------------------------------------- Compression Therapy Details Patient Name: [name] Date of Service: 02/15/2020 10:15 AM Medical Record Number: [id number] Patient Account Number: [id number] Date of Birth/Sex: [birth year] (79 y.o. F) Treating RN: Cornell Barman Primary Care Murel Shenberger: Salome Holmes Other Clinician: Referring Jayveion Stalling: Salome Holmes Treating Debbrah Sampedro/Extender: Tito Dine in Treatment: 3 Compression Therapy Performed for Wound Assessment: Wound #5 Left,Posterior Lower Leg Performed By: Clinician Cornell Barman, RN Compression Type: Four Layer Pre Treatment ABI: 0.8 Post Procedure Diagnosis Same as Pre-procedure Electronic Signature(s) Signed: 02/15/2020 5:22:43 PM By: Gretta Cool, BSN, RN, CWS, Kim RN, BSN Entered By: Gretta Cool, BSN, RN, CWS, Kim on 02/15/2020 10:44:50 Jamar, [name] ([id number]) -------------------------------------------------------------------------------- Encounter Discharge Information Details Patient Name: [name] Date of Service: 02/15/2020 10:15 AM Medical Record Number: [id number] Patient Account Number: [id number] Date of Birth/Sex: Sep 30, 1941 (79 y.o. F) Treating RN: Cornell Barman Primary Care Micholas Drumwright: Salome Holmes Other Clinician: Referring Slade Pierpoint: Salome Holmes Treating Lakeyshia Tuckerman/Extender: Tito Dine in Treatment: 3 Encounter Discharge Information Items Discharge Condition: Stable Ambulatory Status: Ambulatory Discharge Destination: Home Transportation: Private Auto Accompanied By: self Schedule Follow-up Appointment: Yes Clinical Summary of Care: Electronic Signature(s) Signed: 02/15/2020 5:22:43 PM By: Gretta Cool, BSN, RN, CWS, Kim RN, BSN Entered By: Gretta Cool, BSN, RN, CWS, Kim on 02/15/2020 10:45:45 [name], [name]  ([id number]) -------------------------------------------------------------------------------- Lower Extremity Assessment Details Patient Name: [name] Date of Service: 02/15/2020 10:15 AM Medical Record Number: [id number] Patient Account Number: [id number] Date of Birth/Sex: [birth year] (79 y.o. F) Treating RN: Army Melia Primary Care Laporche Martelle: Salome Holmes Other Clinician: Referring Zuma Hust: Salome Holmes Treating Shalayne Leach/Extender: Tito Dine in Treatment: 3 Edema Assessment Assessed: [Left: No] [Right: No] Edema: [Left: Yes] [Right: Yes] Calf Left: Right: Point of Measurement: 35 cm From Medial Instep 40 cm 37 cm Ankle Left: Right: Point of Measurement: 11 cm From  Medial Instep 36 cm 31 cm Vascular Assessment Pulses: Dorsalis Pedis Palpable: [Left:Yes] [Right:Yes] Electronic Signature(s) Signed: 02/15/2020 10:25:17 AM By: Army Melia Entered By: Army Melia on 02/15/2020 10:24:36 [name], [name] ([id number]) -------------------------------------------------------------------------------- Multi Wound Chart Details Patient Name: [name] Date of Service: 02/15/2020 10:15 AM Medical Record Number: [id number] Patient Account Number: [id number] Date of Birth/Sex: 1941-07-02 (79 y.o. F) Treating RN: Cornell Barman Primary Care Cheyne Bungert: Salome Holmes Other Clinician: Referring Brenn Gatton: Salome Holmes Treating Damonte Frieson/Extender: Tito Dine in Treatment: 3 Vital Signs Height(in): 65 Pulse(bpm): Weight(lbs): 140 Blood Pressure(mmHg): Body Mass Index(BMI): 23 Temperature(F): 97.8 Respiratory Rate(breaths/min): 16 Photos: [N/A:N/A] Wound Location: Right, Anterior Lower Leg Left, Posterior Lower Leg N/A Wounding Event: Blister Gradually Appeared N/A Primary Etiology: Venous Leg Ulcer Venous Leg Ulcer N/A Comorbid History: Chronic sinus problems/congestion, Chronic sinus problems/congestion, N/A Anemia, Lymphedema, Angina, Anemia,  Lymphedema, Angina, Arrhythmia, Hypertension, Gout, Arrhythmia, Hypertension, Gout, Rheumatoid Arthritis, Osteoarthritis Rheumatoid Arthritis, Osteoarthritis Date Acquired: 01/22/2020 01/21/2020 N/A Weeks of Treatment: 3 3 N/A Wound Status: Open Open N/A Measurements L x W x D (cm) 4x3x0.1 4x2x0.1 N/A Area (cm) : 9.425 6.283 N/A Volume (cm) : 0.942 0.628 N/A % Reduction in Area: 97.00% 8.60% N/A % Reduction in Volume: 97.00% 8.60% N/A Classification: Full Thickness Without Exposed Full Thickness Without Exposed N/A Support Structures Support Structures Exudate Amount: Large Medium N/A Exudate Type: Serous Serosanguineous N/A Exudate Color: amber red, brown N/A Granulation Amount: Medium (34-66%) Large (67-100%) N/A Granulation Quality: Pink Red N/A Necrotic Amount: Medium (34-66%) Small (1-33%) N/A Exposed Structures: Fat Layer (Subcutaneous Tissue) Fat Layer (Subcutaneous Tissue) N/A Exposed: Yes Exposed: Yes Fascia: No Tendon: No Muscle: No Joint: No Bone: No Epithelialization: Small (1-33%) Large (67-100%) N/A Procedures Performed: Compression Therapy Compression Therapy N/A Treatment Notes Wound #4 (Right, Anterior Lower Leg) Notes Sivercell 4 layer bilateral Frutiger, Ramina M. ([id number]) Wound #5 (Left, Posterior Lower Leg) Notes Sivercell 4 layer bilateral Electronic Signature(s) Signed: 02/15/2020 4:28:02 PM By: Linton Ham MD Entered By: Linton Ham on 02/15/2020 11:27:00 [name], [name] ([id number]) -------------------------------------------------------------------------------- Multi-Disciplinary Care Plan Details Patient Name: [name] Date of Service: 02/15/2020 10:15 AM Medical Record Number: [id number] Patient Account Number: [id number] Date of Birth/Sex: 1941-01-21 (78 y.o. F) Treating RN: Cornell Barman Primary Care Blandina Renaldo: Salome Holmes Other Clinician: Referring Kaori Jumper: Salome Holmes Treating Marzella Miracle/Extender: Tito Dine in  Treatment: 3 Active Inactive Orientation to the Wound Care Program Nursing Diagnoses: Knowledge deficit related to the wound healing center program Goals: Patient/caregiver will verbalize understanding of the [facility] Program Date Initiated: 01/25/2020 Target Resolution Date: 02/01/2020 Goal Status: Active Interventions: Provide education on orientation to the wound center Notes: Wound/Skin Impairment Nursing Diagnoses: Impaired tissue integrity Goals: Ulcer/skin breakdown will have a volume reduction of 30% by week 4 Date Initiated: 01/25/2020 Target Resolution Date: 02/24/2020 Goal Status: Active Interventions: Assess ulceration(s) every visit Treatment Activities: Topical wound management initiated : 01/25/2020 Notes: Electronic Signature(s) Signed: 02/15/2020 5:22:43 PM By: Gretta Cool, BSN, RN, CWS, Kim RN, BSN Entered By: Gretta Cool, BSN, RN, CWS, Kim on 02/15/2020 10:40:47 [name], [name] ([id number]) -------------------------------------------------------------------------------- Pain Assessment Details Patient Name: [name] Date of Service: 02/15/2020 10:15 AM Medical Record Number: [id number] Patient Account Number: [id number] Date of Birth/Sex: 01/31/1941 (78 y.o. F) Treating RN: Army Melia Primary Care Mervyn Pflaum: Salome Holmes Other Clinician: Referring Sahej Schrieber: Salome Holmes Treating Pal Shell/Extender: Tito Dine in Treatment: 3 Active Problems Location of Pain Severity and Description of Pain Patient Has Paino No Site Locations Pain  Management and Medication Current Pain Management: Electronic Signature(s) Signed: 02/15/2020 10:25:17 AM By: Army Melia Entered By: Army Melia on 02/15/2020 10:15:06 [name], [name] ([id number]) -------------------------------------------------------------------------------- Wound Assessment Details Patient Name: [name] Date of Service: 02/15/2020 10:15 AM Medical Record Number: [id number] Patient  Account Number: [id number] Date of Birth/Sex: February 05, 1941 (78 y.o. F) Treating RN: Army Melia Primary Care Virgilene Stryker: Salome Holmes Other Clinician: Referring Michel Hendon: Salome Holmes Treating Mauro Arps/Extender: Tito Dine in Treatment: 3 Wound Status Wound Number: 4 Primary Venous Leg Ulcer Etiology: Wound Location: Right, Anterior Lower Leg Wound Open Wounding Event: Blister Status: Date Acquired: 01/22/2020 Comorbid Chronic sinus problems/congestion, Anemia, Lymphedema, Weeks Of Treatment: 3 History: Angina, Arrhythmia, Hypertension, Gout, Rheumatoid Clustered Wound: No Arthritis, Osteoarthritis Photos Wound Measurements Length: (cm) 4 Width: (cm) 3 Depth: (cm) 0.1 Area: (cm) 9.425 Volume: (cm) 0.942 % Reduction in Area: 97% % Reduction in Volume: 97% Epithelialization: Small (1-33%) Wound Description Classification: Full Thickness Without Exposed Support Struc Exudate Amount: Large Exudate Type: Serous Exudate Color: amber tures Foul Odor After Cleansing: No Slough/Fibrino Yes Wound Bed Granulation Amount: Medium (34-66%) Exposed Structure Granulation Quality: Pink Fascia Exposed: No Necrotic Amount: Medium (34-66%) Fat Layer (Subcutaneous Tissue) Exposed: Yes Necrotic Quality: Adherent Slough Tendon Exposed: No Muscle Exposed: No Joint Exposed: No Bone Exposed: No Treatment Notes Wound #4 (Right, Anterior Lower Leg) Notes Sivercell 4 layer bilateral Electronic Signature(s) Signed: 02/15/2020 10:25:17 AM By: Nils Flack, Prentiss Jerilynn [name] ([id number]) Entered By: Army Melia on 02/15/2020 10:22:41 [place]clough, Blonnie Jerilynn [name] ([id number]) -------------------------------------------------------------------------------- Wound Assessment Details Patient Name: [name] Date of Service: 02/15/2020 10:15 AM Medical Record Number: [id number] Patient Account Number: [id number] Date of Birth/Sex: June 21, 1941 (79 y.o. F) Treating RN: Army Melia Primary Care  Esteen Delpriore: Salome Holmes Other Clinician: Referring Keimani Laufer: Salome Holmes Treating Jermie Hippe/Extender: Tito Dine in Treatment: 3 Wound Status Wound Number: 5 Primary Venous Leg Ulcer Etiology: Wound Location: Left, Posterior Lower Leg Wound Open Wounding Event: Gradually Appeared Status: Date Acquired: 01/21/2020 Comorbid Chronic sinus problems/congestion, Anemia, Lymphedema, Weeks Of Treatment: 3 History: Angina, Arrhythmia, Hypertension, Gout, Rheumatoid Clustered Wound: No Arthritis, Osteoarthritis Photos Wound Measurements Length: (cm) 4 Width: (cm) 2 Depth: (cm) 0.1 Area: (cm) 6.283 Volume: (cm) 0.628 % Reduction in Area: 8.6% % Reduction in Volume: 8.6% Epithelialization: Large (67-100%) Wound Description Classification: Full Thickness Without Exposed Support Struc Exudate Amount: Medium Exudate Type: Serosanguineous Exudate Color: red, brown tures Foul Odor After Cleansing: No Wound Bed Granulation Amount: Large (67-100%) Exposed Structure Granulation Quality: Red Fat Layer (Subcutaneous Tissue) Exposed: Yes Necrotic Amount: Small (1-33%) Necrotic Quality: Adherent Slough Treatment Notes Wound #5 (Left, Posterior Lower Leg) Notes Sivercell 4 layer bilateral Electronic Signature(s) Signed: 02/15/2020 10:25:17 AM By: Army Melia Entered By: Army Melia on 02/15/2020 10:23:10 Postlewait, [name] ([id number]) -------------------------------------------------------------------------------- Vitals Details Patient Name: [name] Date of Service: 02/15/2020 10:15 AM Medical Record Number: [id number] Patient Account Number: [id number] Date of Birth/Sex: 07/10/1941 (79 y.o. F) Treating RN: Army Melia Primary Care Niani Mourer: Salome Holmes Other Clinician: Referring Annalis Kaczmarczyk: Salome Holmes Treating Dianne Whelchel/Extender: Tito Dine in Treatment: 3 Vital Signs Time Taken: 10:14 Temperature (F): 97.8 Height (in): 65 Respiratory Rate  (breaths/min): 16 Weight (lbs): 140 Reference Range: 80 - 120 mg / dl Body Mass Index (BMI): 23.3 Electronic Signature(s) Signed: 02/15/2020 10:25:17 AM By: Army Melia Entered By: Army Melia on 02/15/2020 10:15:00

## 2020-02-16 NOTE — Progress Notes (Signed)
MARLAYA, LAPERLE ([id number]) Visit Report for 02/15/2020 HPI Details Patient Name: [name], [name] Date of Service: 02/15/2020 10:15 AM Medical Record Number: [id number] Patient Account Number: [id number] Date of Birth/Sex: August 05, 1941 (78 y.o. F) Treating RN: Cornell Barman Primary Care Provider: Salome Holmes Other Clinician: Referring Provider: Salome Holmes Treating Provider/Extender: Tito Dine in Treatment: 3 History of Present Illness HPI Description: 03/05/18 on evaluation today patient presents for initial evaluation and our clinic due to significantly large blisters of the bilateral lower extremities which began initially on 02/18/18. 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 Secondarily it does appear that they felt she likely had cellulitis. Subsequently the patient was seen for fault evaluation on 03/03/18 and that is really when the cellulitis was diagnosed. She was actually placed on doxycycline 100 mg by mouth twice a day for 10 days. With that being said she was prescribed prednisone although she did not tolerate this. Apparently she had some alteration in her mental status with taking this. 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 She has never smoked and unfortunately does have pain at the site where these blisters are located of the bilateral lower extremities. She also does appear to have significant lymphedema which obviously has been more chronic. 03/12/18 on evaluation today patient actually appears to be doing much better in regard to her bilateral [employer] ulcers. Unfortunately a lot of the alginate dressing did stick to her legs due to the fact that everything dried up rather rapidly in the compression seems to have done very well in that regard. Unfortunately the home health nurse last time she came out  to the patient's home was not able to fully remove the alginate from her leg so she just rewrapped it with what was there continuing to be in place. Obviously that does need to come off but overall I'm extremely pleased with the progress it's been made over the past week which is the Kerlex and Coban wraps. 03/18/18-She is here in follow-up evaluation for bilateral lower extremity lymphedema with blister/ulceration. She is essentially healed to the right lower extremity with one residual pinpoint opening, the left lower extremity has multiple pinpoint openings with minimal amount of serous drainage. We will continue with compression over this next week. We have requested that home help initiate the process of ordering compression wraps for long-term management. She will follow-up next week, I anticipate she will be healed at that appointment 03/26/18 on evaluation today patient appears to be doing extremely well in regard to her bilateral lower extremity ulcers. This has completely resolved for the most part on the left lower extremity and has completely resolved in regard to the right lower extremity. She's been tolerating the dressing changes without complication including the wraps. Overall her swelling is down significantly and I think this accounts for why she is doing so much better at this time. Overall she is very pleased and having very little discomfort compared to previous. 04/02/18 on evaluation today patient actually appears to be completely healed which is excellent news. She has been tolerating the wraps without complication obviously this has done very well for her. In general she shows no signs of infection or otherwise complicating factors. She's having no pain. Readmission: 06/02/2019 on evaluation today patient appears to be unfortunately experiencing issues with increased swelling of her bilateral  lower extremities. I have last seen her in June 2019. At that time everything healed and  it does appear that we actually got her some Velcro compression at that time. Nonetheless she states she was wearing that until about 5 weeks ago and then subsequently she quit wearing it she tells me the only excuse that she can give is "laziness". Nonetheless she is not able to get any compression sleeves/stockings on. she tells me currently that she is not experiencing any evidence of infection at this time she is been having some leaking intermittently from her lower extremities right now the left on the lateral portion is the only area open. No fevers, chills, nausea, vomiting, or diarrhea. 06/09/2019 on evaluation today patient actually appears to be doing quite well with regard to her leg ulcers in fact everything appears to be completely closed at this time bilaterally. There is no signs of active infection at this time which is good news. Overall I am very pleased. She did bring her Velcro compression wraps with her today. READMISSION 01/25/2020 This is a now 79 year old woman who is been in this clinic for a prolonged period of time in 2019 and more recently in 2020 with bilateral lower extremity blisters and wounds in the setting of chronic lymphedema. She is generally fairly noncompliant with compression. When she was here in 2020 she was discharged with bilateral zippered stockings but she tells as these stopped working and she has not been wearing anything for a long period of time. She came in today after developing a substantial superficial blister on the right anterior and lateral lower leg which she said started on Sunday. She has not had any pain. As noted she is not wearing compression stockings. Past medical history includes chronic myelocytic leukemia presumably in remission although I did not really see anything about this on [place] link, coronary artery disease, hypertension, lupus on hydroxychloroquine, stage III chronic renal failure with an estimated GFR of 39 based on  lab work in February of this year. We did not do her ABIs today although most recently in 2020 these were 0.84 and 0.82 right and left respectively 4/21; substantial wound on the right lateral lower leg which was a humongous blister last week. She also has an area on the right lateral calf. We put her in compression with silver alginate. 4/28; substantial wound on the right lateral lower leg is epithelializing nicely. The area on the right lateral posterior calf has fully epithelialized today. We have been using silver alginate under compression. 5/5; substantial blistered area on the right lateral lower leg has epithelialized nicely there is only a small open area here. The area on the left Wisby, Particia M. ([id number]) lateral calf posteriorly is reopened. Also some drainage here. The patient has bilateral zippered extremitease stockings Electronic Signature(s) Signed: 02/15/2020 4:28:02 PM By: Linton Ham MD Entered By: Linton Ham on 02/15/2020 11:30:17 Hayton, [name] ([id number]) -------------------------------------------------------------------------------- Physical Exam Details Patient Name: [name] Date of Service: 02/15/2020 10:15 AM Medical Record Number: [id number] Patient Account Number: [id number] Date of Birth/Sex: 10-Mar-1941 (78 y.o. F) Treating RN: Cornell Barman Primary Care Provider: Salome Holmes Other Clinician: Referring Provider: Salome Holmes Treating Provider/Extender: Tito Dine in Treatment: 3 Constitutional Respirations regular, non-labored and within target range.. Temperature is normal and within the target range for the patient.. Cardiovascular Lymphedema is present. Notes Wound exam; the blistered area on the right anterior lower leg is filled then almost completely still a nonepithelialized area  in the middle of this wound oThe area on the left lateral lower leg which was fully epithelialized last time has reopened also some  drainage. There is a lot of swelling in this area Electronic Signature(s) Signed: 02/15/2020 4:28:02 PM By: Linton Ham MD Entered By: Linton Ham on 02/15/2020 11:31:16 Pelc, [name] ([id number]) -------------------------------------------------------------------------------- Physician Orders Details Patient Name: [name] Date of Service: 02/15/2020 10:15 AM Medical Record Number: [id number] Patient Account Number: [id number] Date of Birth/Sex: Jul 04, 1941 (78 y.o. F) Treating RN: Cornell Barman Primary Care Provider: Salome Holmes Other Clinician: Referring Provider: Salome Holmes Treating Provider/Extender: Tito Dine in Treatment: 3 Verbal / Phone Orders: No Diagnosis Coding Wound Cleansing Wound #4 Right,Anterior Lower Leg o Clean wound with Normal Saline. Wound #5 Left,Posterior Lower Leg o Clean wound with Normal Saline. Skin Barriers/Peri-Wound Care Wound #4 Right,Anterior Lower Leg o Barrier cream Wound #5 Left,Posterior Lower Leg o Barrier cream Primary Wound Dressing Wound #4 Right,Anterior Lower Leg o Silver Alginate Wound #5 Left,Posterior Lower Leg o Silver Alginate Secondary Dressing Wound #5 Left,Posterior Lower Leg o XtraSorb Wound #4 Right,Anterior Lower Leg o ABD pad Dressing Change Frequency Wound #4 Right,Anterior Lower Leg o Change dressing every week Wound #5 Left,Posterior Lower Leg o Change dressing every week Follow-up Appointments Wound #4 Right,Anterior Lower Leg o Return Appointment in 1 week. o Nurse Visit as needed Wound #5 Left,Posterior Lower Leg o Return Appointment in 1 week. o Nurse Visit as needed Edema Control Wound #4 Right,Anterior Lower Leg o 4 Layer Compression System - Bilateral o Elevate legs to the level of the heart and pump ankles as often as possible Wound #5 Left,Posterior Lower Leg o 4 Layer Compression System - Bilateral o Elevate legs to the level of  the heart and pump ankles as often as possible JAQUILA, BUROW ([id number]) Electronic Signature(s) Signed: 02/15/2020 4:28:02 PM By: Linton Ham MD Signed: 02/15/2020 5:22:43 PM By: Gretta Cool, BSN, RN, CWS, Kim RN, BSN Entered By: Gretta Cool, BSN, RN, CWS, Kim on 02/15/2020 10:43:09 Kallen, [name] ([id number]) -------------------------------------------------------------------------------- Problem List Details Patient Name: [name] Date of Service: 02/15/2020 10:15 AM Medical Record Number: [id number] Patient Account Number: [id number] Date of Birth/Sex: [birth year] (78 y.o. 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 F) Treating RN: Cornell Barman Primary Care Provider: Salome Holmes Other Clinician: Referring Provider: Salome Holmes Treating Provider/Extender: Tito Dine in Treatment: 3 Subjective History of Present Illness (HPI) 03/05/18 on evaluation today patient presents for initial evaluation and our clinic due to significantly  large blisters of the bilateral lower extremities which began initially on 02/18/18. 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 Secondarily it does appear that they  felt she likely had cellulitis. Subsequently the patient was seen for fault evaluation on 03/03/18 and that is really when the cellulitis was diagnosed. She was actually placed on doxycycline 100 mg by mouth twice a day for 10 days. With that being said she was prescribed prednisone although she did not tolerate this. Apparently she had some alteration in her mental status with taking this. 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 She has never smoked and unfortunately does have pain at the site where these blisters are located of the bilateral lower extremities. She also does appear to have significant lymphedema which obviously has been more chronic. 03/12/18 on evaluation today patient actually appears to be doing much better in regard to her bilateral [employer] ulcers. Unfortunately a lot of the alginate dressing did stick to her legs due to the fact that everything dried up rather rapidly in the compression seems to have done very well in that regard. Unfortunately the home health nurse last time she came out to the patient's home was not able to fully remove the alginate from her leg so she just rewrapped it with what was there continuing to be in place. Obviously that does need to come off but overall I'm extremely pleased with the progress it's been made over the past week which is the Kerlex and Coban wraps. 03/18/18-She is here in follow-up evaluation for bilateral lower extremity lymphedema with blister/ulceration. She is essentially healed to the right lower extremity with one residual pinpoint opening, the left lower extremity has multiple pinpoint openings with minimal  amount of serous drainage. We will continue with compression over this next week. We have requested that home help initiate the process of ordering compression wraps for long-term management. She will follow-up next week, I anticipate she will be healed at that appointment 03/26/18 on evaluation today patient appears to be doing extremely well in regard to her bilateral lower extremity ulcers. This has completely resolved for the most part on the left lower extremity and has completely resolved in regard to the right lower extremity. She's been tolerating the dressing changes without complication including the wraps. Overall her swelling is down significantly and I think this accounts for why she is doing so much better at this time. Overall she is very pleased and having very little discomfort compared to previous. 04/02/18 on evaluation today patient actually appears to be completely healed which is excellent news. She has been tolerating the wraps without complication obviously this has done very well for her. In general she shows no signs of infection or otherwise complicating factors. She's having no pain. Readmission: 06/02/2019 on evaluation today patient appears to be unfortunately experiencing issues with increased swelling of her bilateral lower extremities. I have last seen her in June 2019. At that time everything healed and it does appear that we actually got her some Velcro compression at that time. Nonetheless she states she was wearing that until about 5 weeks ago and then subsequently she quit wearing it she tells me the only excuse that she can give is "laziness". Nonetheless she is not able to get any compression sleeves/stockings on. she tells me currently that she is not experiencing any evidence of infection at this time she is been having some leaking intermittently from her lower extremities right now the left on the lateral portion is the only area open. No fevers, chills,  nausea, vomiting, or diarrhea. 06/09/2019 on evaluation  today patient actually appears to be doing quite well with regard to her leg ulcers in fact everything appears to be completely closed at this time bilaterally. There is no signs of active infection at this time which is good news. Overall I am very pleased. She did bring her Velcro compression wraps with her today. READMISSION 01/25/2020 This is a now 79 year old woman who is been in this clinic for a prolonged period of time in 2019 and more recently in 2020 with bilateral lower extremity blisters and wounds in the setting of chronic lymphedema. She is generally fairly noncompliant with compression. When she was here in 2020 she was discharged with bilateral zippered stockings but she tells as these stopped working and she has not been wearing anything for a long period of time. She came in today after developing a substantial superficial blister on the right anterior and lateral lower leg which she said started on Sunday. She has not had any pain. As noted she is not wearing compression stockings. Past medical history includes chronic myelocytic leukemia presumably in remission although I did not really see anything about this on [place] link, coronary artery disease, hypertension, lupus on hydroxychloroquine, stage III chronic renal failure with an estimated GFR of 39 based on lab work in February of this year. We did not do her ABIs today although most recently in 2020 these were 0.84 and 0.82 right and left respectively 4/21; substantial wound on the right lateral lower leg which was a humongous blister last week. She also has an area on the right lateral calf. We put her in compression with silver alginate. 4/28; substantial wound on the right lateral lower leg is epithelializing nicely. The area on the right lateral posterior calf has fully epithelialized today. We have been using silver alginate under compression. 5/5; substantial  blistered area on the right lateral lower leg has epithelialized nicely there is only a small open area here. The area on the left lateral calf posteriorly is reopened. Also some drainage here. The patient has bilateral zippered extremitease stockings Medine, Jacilyn M. ([id number]) Objective Constitutional Respirations regular, non-labored and within target range.. Temperature is normal and within the target range for the patient.. Vitals Time Taken: 10:14 AM, Height: 65 in, Weight: 140 lbs, BMI: 23.3, Temperature: 97.8 F, Respiratory Rate: 16 breaths/min. Cardiovascular Lymphedema is present. General Notes: Wound exam; the blistered area on the right anterior lower leg is filled then almost completely still a nonepithelialized area in the middle of this wound The area on the left lateral lower leg which was fully epithelialized last time has reopened also some drainage. There is a lot of swelling in this area Integumentary (Hair, Skin) Wound #4 status is Open. Original cause of wound was Blister. The wound is located on the Right,Anterior Lower Leg. The wound measures 4cm length x 3cm width x 0.1cm depth; 9.425cm^2 area and 0.942cm^3 volume. There is Fat Layer (Subcutaneous Tissue) Exposed exposed. There is a large amount of serous drainage noted. There is medium (34-66%) pink granulation within the wound bed. There is a medium (34-66%) amount of necrotic tissue within the wound bed including Adherent Slough. Wound #5 status is Open. Original cause of wound was Gradually Appeared. The wound is located on the Left,Posterior Lower Leg. The wound measures 4cm length x 2cm width x 0.1cm depth; 6.283cm^2 area and 0.628cm^3 volume. There is Fat Layer (Subcutaneous Tissue) Exposed exposed. There is a medium amount of serosanguineous drainage noted. There is large (67-100%) red granulation  within the wound bed. There is a small (1-33%) amount of necrotic tissue within the wound bed including Adherent  Slough. Assessment Active Problems ICD-10 Non-pressure chronic ulcer of other part of right lower leg limited to breakdown of skin Non-pressure chronic ulcer of left calf with other specified severity Lymphedema, not elsewhere classified Procedures Wound #4 Pre-procedure diagnosis of Wound #4 is a Venous Leg Ulcer located on the Right,Anterior Lower Leg . There was a Four Layer Compression Therapy Procedure with a pre-treatment ABI of 0.8 by Cornell Barman, RN. Post procedure Diagnosis Wound #4: Same as Pre-Procedure Wound #5 Pre-procedure diagnosis of Wound #5 is a Venous Leg Ulcer located on the Left,Posterior Lower Leg . There was a Four Layer Compression Therapy Procedure with a pre-treatment ABI of 0.8 by Cornell Barman, RN. Post procedure Diagnosis Wound #5: Same as Pre-Procedure Plan Wound Cleansing: Wound #4 Right,Anterior Lower Leg: Ruff, Sihaam M. ([id number]) Clean wound with Normal Saline. Wound #5 Left,Posterior Lower Leg: Clean wound with Normal Saline. Skin Barriers/Peri-Wound Care: Wound #4 Right,Anterior Lower Leg: Barrier cream Wound #5 Left,Posterior Lower Leg: Barrier cream Primary Wound Dressing: Wound #4 Right,Anterior Lower Leg: Silver Alginate Wound #5 Left,Posterior Lower Leg: Silver Alginate Secondary Dressing: Wound #5 Left,Posterior Lower Leg: XtraSorb Wound #4 Right,Anterior Lower Leg: ABD pad Dressing Change Frequency: Wound #4 Right,Anterior Lower Leg: Change dressing every week Wound #5 Left,Posterior Lower Leg: Change dressing every week Follow-up Appointments: Wound #4 Right,Anterior Lower Leg: Return Appointment in 1 week. Nurse Visit as needed Wound #5 Left,Posterior Lower Leg: Return Appointment in 1 week. Nurse Visit as needed Edema Control: Wound #4 Right,Anterior Lower Leg: 4 Layer Compression System - Bilateral Elevate legs to the level of the heart and pump ankles as often as possible Wound #5 Left,Posterior Lower Leg: 4 Layer  Compression System - Bilateral Elevate legs to the level of the heart and pump ankles as often as possible 1. Silver alginate to continue to both wound areas with Xtrasorb and ABD 2. I increased the compression from 3-4 layer to see if we can control the swelling predominantly on the left lateral calf 3. She has really good external compression garments. I am hopeful she will transition to these once these areas are closed although I think her compliance with these in the past has been less than stellar Electronic Signature(s) Signed: 02/15/2020 4:28:02 PM By: Linton Ham MD Entered By: Linton Ham on 02/15/2020 11:34:22 Graca, [name] ([id number]) -------------------------------------------------------------------------------- SuperBill Details Patient Name: [name] Date of Service: 02/15/2020 Medical Record Number: [id number] Patient Account Number: [id number] Date of Birth/Sex: [birth year] (78 y.o. 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

## 2020-02-22 ENCOUNTER — Encounter: Payer: Medicare Other | Admitting: Internal Medicine

## 2020-02-22 ENCOUNTER — Other Ambulatory Visit: Payer: Self-pay

## 2020-02-22 DIAGNOSIS — L97811 Non-pressure chronic ulcer of other part of right lower leg limited to breakdown of skin: Secondary | ICD-10-CM | POA: Diagnosis not present

## 2020-02-29 ENCOUNTER — Other Ambulatory Visit: Payer: Self-pay

## 2020-02-29 ENCOUNTER — Encounter: Payer: Medicare Other | Admitting: Internal Medicine

## 2020-02-29 DIAGNOSIS — L97811 Non-pressure chronic ulcer of other part of right lower leg limited to breakdown of skin: Secondary | ICD-10-CM | POA: Diagnosis not present

## 2020-03-01 NOTE — Progress Notes (Signed)
[name] ([id number]) Visit Report for 02/29/2020 Arrival Information Details Patient Name: Kristina, [name] Date of Service: 02/29/2020 10:45 AM Medical Record Number: [id number] Patient Account Number: [id number] Date of Birth/Sex: 02-May-1941 (78 y.o. F) Treating RN: [name] Primary Care [name]: [name] Other Clinician: Referring [name]: [name] Treating [name]/Extender: [name] in Treatment: 5 Visit Information History Since Last Visit Added or deleted any medications: No Patient Arrived: Cane Any new allergies or adverse reactions: No Arrival Time: 10:55 Had a fall or experienced change in No Accompanied By: self activities of daily living that may affect Transfer Assistance: None risk of falls: Patient Identification Verified: Yes Signs or symptoms of abuse/neglect since last visito No Secondary Verification Process Completed: Yes Hospitalized since last visit: No Patient Has Alerts: Yes Implantable device outside of the clinic excluding No Patient Alerts: 06/02/19 ABI cellular tissue based products placed in the center L 0.84 R 0.82 since last visit: Has Dressing in Place as Prescribed: Yes Has Compression in Place as Prescribed: Yes Pain Present Now: No Electronic Signature(s) Signed: 02/29/2020 4:52:06 PM By: [name] Entered By: [name] on 02/29/2020 10:55:17 [name], [name] ([id number]) -------------------------------------------------------------------------------- Clinic Level of Care Assessment Details Patient Name: [name] Date of Service: 02/29/2020 10:45 AM Medical Record Number: [id number] Patient Account Number: [id number] Date of Birth/Sex: 11/03/1940 (78 y.o. F) Treating RN: [name] Primary Care [name]: [name] Other Clinician: Referring [name]: [name] Treating [name]/Extender: [name] in Treatment: 5 Clinic Level of Care Assessment Items TOOL 4  Quantity Score []  - Use when only an EandM is performed on FOLLOW-UP visit 0 ASSESSMENTS - Nursing Assessment / Reassessment X - Reassessment of Co-morbidities (includes updates in patient status) 1 10 X- 1 5 Reassessment of Adherence to Treatment Plan ASSESSMENTS - Wound and Skin Assessment / Reassessment X - Simple Wound Assessment / Reassessment - one wound 1 5 []  - 0 Complex Wound Assessment / Reassessment - multiple wounds []  - 0 Dermatologic / Skin Assessment (not related to wound area) ASSESSMENTS - Focused Assessment []  - Circumferential Edema Measurements - multi extremities 0 []  - 0 Nutritional Assessment / Counseling / Intervention []  - 0 Lower Extremity Assessment (monofilament, tuning fork, pulses) []  - 0 Peripheral Arterial Disease Assessment (using hand held doppler) ASSESSMENTS - Ostomy and/or Continence Assessment and Care []  - Incontinence Assessment and Management 0 []  - 0 Ostomy Care Assessment and Management (repouching, etc.) PROCESS - Coordination of Care X - Simple Patient / Family Education for ongoing care 1 15 []  - 0 Complex (extensive) Patient / Family Education for ongoing care []  - 0 Staff obtains Programmer, systems, Records, Test Results / Process Orders []  - 0 Staff telephones HHA, Nursing Homes / Clarify orders / etc []  - 0 Routine Transfer to another Facility (non-emergent condition) []  - 0 Routine Hospital Admission (non-emergent condition) []  - 0 New Admissions / Biomedical engineer / Ordering NPWT, Apligraf, etc. []  - 0 Emergency Hospital Admission (emergent condition) X- 1 10 Simple Discharge Coordination []  - 0 Complex (extensive) Discharge Coordination PROCESS - Special Needs []  - Pediatric / Minor Patient Management 0 []  - 0 Isolation Patient Management []  - 0 Hearing / Language / Visual special needs []  - 0 Assessment of Community assistance (transportation, D/C planning, etc.) []  - 0 Additional assistance / Altered  mentation []  - 0 Support Surface(s) Assessment (bed, cushion, seat, etc.) INTERVENTIONS - Wound Cleansing / Measurement [name], Kristina M. ([id number]) []  - 0 Simple Wound  Cleansing - one wound []  - 0 Complex Wound Cleansing - multiple wounds X- 1 5 Wound Imaging (photographs - any number of wounds) []  - 0 Wound Tracing (instead of photographs) X- 1 5 Simple Wound Measurement - one wound []  - 0 Complex Wound Measurement - multiple wounds INTERVENTIONS - Wound Dressings []  - Small Wound Dressing one or multiple wounds 0 []  - 0 Medium Wound Dressing one or multiple wounds []  - 0 Large Wound Dressing one or multiple wounds []  - 0 Application of Medications - topical []  - 0 Application of Medications - injection INTERVENTIONS - Miscellaneous []  - External ear exam 0 []  - 0 Specimen Collection (cultures, biopsies, blood, body fluids, etc.) []  - 0 Specimen(s) / Culture(s) sent or taken to Lab for analysis []  - 0 Patient Transfer (multiple staff / Civil Service fast streamer / Similar devices) []  - 0 Simple Staple / Suture removal (25 or less) []  - 0 Complex Staple / Suture removal (26 or more) []  - 0 Hypo / Hyperglycemic Management (close monitor of Blood Glucose) []  - 0 Ankle / Brachial Index (ABI) - do not check if billed separately X- 1 5 Vital Signs Has the patient been seen at the hospital within the last three years: Yes Total Score: 60 Level Of Care: New/Established - Level 2 Electronic Signature(s) Signed: 03/01/2020 9:48:50 AM By: [name], BSN, RN, CWS, Kim RN, BSN Entered By: [name], BSN, RN, CWS, [name] on 02/29/2020 11:14:47 [name], [name] ([id number]) -------------------------------------------------------------------------------- Encounter Discharge Information Details Patient Name: [name] Date of Service: 02/29/2020 10:45 AM Medical Record Number: [id number] Patient Account Number: [id number] Date of Birth/Sex: 1941-02-28 (78 y.o. F) Treating RN: [name] Primary Care  [name]: [name] Other Clinician: Referring [name]: [name] Treating [name]/Extender: [name] in Treatment: 5 Encounter Discharge Information Items Discharge Condition: Stable Ambulatory Status: Ambulatory Discharge Destination: Home Transportation: Private Auto Accompanied By: self Schedule Follow-up Appointment: No Clinical Summary of Care: Electronic Signature(s) Signed: 03/01/2020 9:48:50 AM By: [name], BSN, RN, CWS, Kim RN, BSN Entered By: [name], BSN, RN, CWS, [name] on 02/29/2020 11:13:29 Sinclair, [name] ([id number]) -------------------------------------------------------------------------------- Lower Extremity Assessment Details Patient Name: [name] Date of Service: 02/29/2020 10:45 AM Medical Record Number: [id number] Patient Account Number: [id number] Date of Birth/Sex: [birth year] (78 y.o. F) Treating RN: [name] Primary Care Jocsan Mcginley: [name] Other Clinician: Referring Mason Burleigh: [name] Treating Travell Desaulniers/Extender: [name] in Treatment: 5 Edema Assessment Assessed: [Left: No] [Right: No] Edema: [Left: Yes] [Right: Yes] Calf Left: Right: Point of Measurement: 35 cm From Medial Instep 40.5 cm 38.5 cm Ankle Left: Right: Point of Measurement: 11 cm From Medial Instep 34 cm 30 cm Vascular Assessment Pulses: Dorsalis Pedis Palpable: [Left:Yes] [Right:Yes] Electronic Signature(s) Signed: 02/29/2020 4:52:06 PM By: [name] Entered By: [name] on 02/29/2020 11:03:25 [name], [name] ([id number]) -------------------------------------------------------------------------------- Multi Wound Chart Details Patient Name: [name] Date of Service: 02/29/2020 10:45 AM Medical Record Number: [id number] Patient Account Number: [id number] Date of Birth/Sex: 1941-09-08 (78 y.o. F) Treating RN: [name] Primary Care Starla Deller: [name] Other Clinician: Referring Orry Sigl: [name] Treating Tim Corriher/Extender: [name] in Treatment: 5 Vital Signs Height(in): 65 Pulse(bpm): 1 Weight(lbs): 140 Blood Pressure(mmHg): 218/76 Body Mass Index(BMI): 23 Temperature(F): 98.0 Respiratory Rate(breaths/min): 16 Photos: [N/A:N/A] Wound Location: Left, Posterior Lower Leg N/A N/A Wounding Event: Gradually Appeared N/A N/A Primary Etiology: Venous Leg Ulcer N/A N/A Comorbid History: Chronic sinus problems/congestion, N/A N/A Anemia, Lymphedema, Angina, Arrhythmia, Hypertension, Gout,  Rheumatoid Arthritis, Osteoarthritis Date Acquired: 01/21/2020 N/A N/A Weeks of Treatment: 5 N/A N/A Wound Status: Open N/A N/A Measurements L x W x D (cm) 0x0x0 N/A N/A Area (cm) : 0 N/A N/A Volume (cm) : 0 N/A N/A % Reduction in Area: 100.00% N/A N/A % Reduction in Volume: 100.00% N/A N/A Classification: Full Thickness Without Exposed N/A N/A Support Structures Exudate Amount: None Present N/A N/A Granulation Amount: None Present (0%) N/A N/A Necrotic Amount: None Present (0%) N/A N/A Exposed Structures: Fascia: No N/A N/A Fat Layer (Subcutaneous Tissue) Exposed: No Tendon: No Muscle: No Joint: No Bone: No Limited to Skin Breakdown Epithelialization: Large (67-100%) N/A N/A Treatment Notes Electronic Signature(s) Signed: 02/29/2020 5:06:12 PM By: Linton Ham MD Entered By: Linton Ham on 02/29/2020 11:45:38 [name], [name] ([id number]) -------------------------------------------------------------------------------- [place] Details Patient Name: [name] Date of Service: 02/29/2020 10:45 AM Medical Record Number: [id number] Patient Account Number: [id number] Date of Birth/Sex: 1941/06/09 (78 y.o. F) Treating RN: [name] Primary Care Charlese Gruetzmacher: [name] Other Clinician: Referring Tambi Thole: [name] Treating Inette Doubrava/Extender: [name] in Treatment: 5 Active Inactive Electronic  Signature(s) Signed: 03/01/2020 9:48:50 AM By: [name], BSN, RN, CWS, Kim RN, BSN Entered By: [name], BSN, RN, CWS, [name] on 02/29/2020 11:11:58 Zender, [name] ([id number]) -------------------------------------------------------------------------------- Pain Assessment Details Patient Name: [name] Date of Service: 02/29/2020 10:45 AM Medical Record Number: [id number] Patient Account Number: [id number] Date of Birth/Sex: [birth year] (78 y.o. F) Treating RN: [name] Primary Care Kamarie Palma: [name] Other Clinician: Referring Jules Vidovich: [name] Treating Quaneshia Wareing/Extender: [name] in Treatment: 5 Active Problems Location of Pain Severity and Description of Pain Patient Has Paino No Site Locations Pain Management and Medication Current Pain Management: Electronic Signature(s) Signed: 02/29/2020 4:52:06 PM By: [name] Entered By: [name] on 02/29/2020 10:55:26 [name], [name] ([id number]) -------------------------------------------------------------------------------- Patient/Caregiver Education Details Patient Name: [name] Date of Service: 02/29/2020 10:45 AM Medical Record Number: [id number] Patient Account Number: [id number] Date of Birth/Gender: 07-07-1941 (78 y.o. F) Treating RN: [name] Primary Care Physician: [name] Other Clinician: Referring Physician: Salome [name] Treating Physician/Extender: [name] in Treatment: 5 Education Assessment Education Provided To: Patient Education Topics Provided Venous: Handouts: Controlling Swelling with Compression Stockings , Other: Extreme ease velcro wraps Methods: Demonstration Responses: State content correctly Electronic Signature(s) Signed: 03/01/2020 9:48:50 AM By: [name], BSN, RN, CWS, Kim RN, BSN Entered By: [name], BSN, RN, CWS, [name] on 02/29/2020 11:15:39 [name], [name]  ([id number]) -------------------------------------------------------------------------------- Wound Assessment Details Patient Name: [name] Date of Service: 02/29/2020 10:45 AM Medical Record Number: [id number] Patient Account Number: [id number] Date of Birth/Sex: May 23, 1941 (78 y.o. F) Treating RN: [name] Primary Care Malynn Lucy: [name] Other Clinician: Referring Mailen Newborn: [name] Treating Johnsie Moscoso/Extender: [name] in Treatment: 5 Wound Status Wound Number: 5 Primary Venous Leg Ulcer Etiology: Wound Location: Left, Posterior Lower Leg Wound Open Wounding Event: Gradually Appeared Status: Date Acquired: 01/21/2020 Comorbid Chronic sinus problems/congestion, Anemia, Lymphedema, Weeks Of Treatment: 5 History: Angina, Arrhythmia, Hypertension, Gout, Rheumatoid Clustered Wound: No Arthritis, Osteoarthritis Photos Wound Measurements Length: (cm) 0 Width: (cm) 0 Depth: (cm) 0 Area: (cm) 0 Volume: (cm) 0 % Reduction in Area: 100% % Reduction in Volume: 100% Epithelialization: Large (67-100%) Tunneling: No Undermining: No Wound Description Classification: Full Thickness Without Exposed Support Structure Exudate Amount: None Present s Foul Odor After Cleansing: No Slough/Fibrino No Wound Bed Granulation Amount: None Present (0%) Exposed Structure Necrotic Amount: None Present (  0%) Fascia Exposed: No Fat Layer (Subcutaneous Tissue) Exposed: No Tendon Exposed: No Muscle Exposed: No Joint Exposed: No Bone Exposed: No Limited to Skin Breakdown Electronic Signature(s) Signed: 02/29/2020 4:52:06 PM By: [name] Entered By: [name] on 02/29/2020 11:02:53 [name], [name] ([id number]) -------------------------------------------------------------------------------- Vitals Details Patient Name: [name] Date of Service: 02/29/2020 10:45 AM Medical Record Number: [id number] Patient Account Number: [id number] Date of  Birth/Sex: 1941-07-01 (78 y.o. F) Treating RN: [name] Primary Care Moo Gravley: [name] Other Clinician: Referring Renaye Janicki: [name] Treating Aleshka Corney/Extender: [name] in Treatment: 5 Vital Signs Time Taken: 10:55 Temperature (F): 98.0 Height (in): 65 Pulse (bpm): 73 Weight (lbs): 140 Respiratory Rate (breaths/min): 16 Body Mass Index (BMI): 23.3 Blood Pressure (mmHg): 218/76 Reference Range: 80 - 120 mg / dl Notes manual recheck 198/76, MD notified Electronic Signature(s) Signed: 02/29/2020 4:52:06 PM By: [name] Entered By: [name] on 02/29/2020 10:58:39

## 2020-03-01 NOTE — Progress Notes (Signed)
MAKAYLAH, LOCHAN ([id number]) Visit Report for 02/29/2020 HPI Details Patient Name: [name], [name] Date of Service: 02/29/2020 10:45 AM Medical Record Number: [id number] Patient Account Number: [id number] Date of Birth/Sex: 07-20-1941 (79 y.o. F) Treating RN: Cornell Barman Primary Care Provider: Salome Holmes Other Clinician: Referring Provider: Salome Holmes Treating Provider/Extender: Tito Dine in Treatment: 5 History of Present Illness HPI Description: 03/05/18 on evaluation today patient presents for initial evaluation and our clinic due to significantly large blisters of the bilateral lower extremities which began initially on 02/18/18. 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 Secondarily it does appear that they felt she likely had cellulitis. Subsequently the patient was seen for fault evaluation on 03/03/18 and that is really when the cellulitis was diagnosed. She was actually placed on doxycycline 100 mg by mouth twice a day for 10 days. With that being said she was prescribed prednisone although she did not tolerate this. Apparently she had some alteration in her mental status with taking this. 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 She has never smoked and unfortunately does have pain at the site where these blisters are located of the bilateral lower extremities. She also does appear to have significant lymphedema which obviously has been more chronic. 03/12/18 on evaluation today patient actually appears to be doing much better in regard to her bilateral [employer] ulcers. Unfortunately a lot of the alginate dressing did stick to her legs due to the fact that everything dried up rather rapidly in the compression seems to have done very well in that regard. Unfortunately the home health nurse last time she came  out to the patient's home was not able to fully remove the alginate from her leg so she just rewrapped it with what was there continuing to be in place. Obviously that does need to come off but overall I'm extremely pleased with the progress it's been made over the past week which is the Kerlex and Coban wraps. 03/18/18-She is here in follow-up evaluation for bilateral lower extremity lymphedema with blister/ulceration. She is essentially healed to the right lower extremity with one residual pinpoint opening, the left lower extremity has multiple pinpoint openings with minimal amount of serous drainage. We will continue with compression over this next week. We have requested that home help initiate the process of ordering compression wraps for long-term management. She will follow-up next week, I anticipate she will be healed at that appointment 03/26/18 on evaluation today patient appears to be doing extremely well in regard to her bilateral lower extremity ulcers. This has completely resolved for the most part on the left lower extremity and has completely resolved in regard to the right lower extremity. She's been tolerating the dressing changes without complication including the wraps. Overall her swelling is down significantly and I think this accounts for why she is doing so much better at this time. Overall she is very pleased and having very little discomfort compared to previous. 04/02/18 on evaluation today patient actually appears to be completely healed which is excellent news. She has been tolerating the wraps without complication obviously this has done very well for her. In general she shows no signs of infection or otherwise complicating factors. She's having no pain. Readmission: 06/02/2019 on evaluation today patient appears to be unfortunately experiencing issues with increased swelling of her bilateral  lower extremities. I have last seen her in June 2019. At that time everything healed  and it does appear that we actually got her some Velcro compression at that time. Nonetheless she states she was wearing that until about 5 weeks ago and then subsequently she quit wearing it she tells me the only excuse that she can give is "laziness". Nonetheless she is not able to get any compression sleeves/stockings on. she tells me currently that she is not experiencing any evidence of infection at this time she is been having some leaking intermittently from her lower extremities right now the left on the lateral portion is the only area open. No fevers, chills, nausea, vomiting, or diarrhea. 06/09/2019 on evaluation today patient actually appears to be doing quite well with regard to her leg ulcers in fact everything appears to be completely closed at this time bilaterally. There is no signs of active infection at this time which is good news. Overall I am very pleased. She did bring her Velcro compression wraps with her today. READMISSION 01/25/2020 This is a now 79 year old woman who is been in this clinic for a prolonged period of time in 2019 and more recently in 2020 with bilateral lower extremity blisters and wounds in the setting of chronic lymphedema. She is generally fairly noncompliant with compression. When she was here in 2020 she was discharged with bilateral zippered stockings but she tells as these stopped working and she has not been wearing anything for a long period of time. She came in today after developing a substantial superficial blister on the right anterior and lateral lower leg which she said started on Sunday. She has not had any pain. As noted she is not wearing compression stockings. Past medical history includes chronic myelocytic leukemia presumably in remission although I did not really see anything about this on [place] link, coronary artery disease, hypertension, lupus on hydroxychloroquine, stage III chronic renal failure with an estimated GFR of 39  based on lab work in February of this year. We did not do her ABIs today although most recently in 2020 these were 0.84 and 0.82 right and left respectively 4/21; substantial wound on the right lateral lower leg which was a humongous blister last week. She also has an area on the right lateral calf. We put her in compression with silver alginate. 4/28; substantial wound on the right lateral lower leg is epithelializing nicely. The area on the right lateral posterior calf has fully epithelialized today. We have been using silver alginate under compression. 5/5; substantial blistered area on the right lateral lower leg has epithelialized nicely there is only a small open area here. The area on the left [name], Kristina M. ([id number]) lateral calf posteriorly is reopened. Also some drainage here. The patient has bilateral zippered extremitease stockings 5/12; the patient did not bring her stockings this week. The substantial blistered area on the right lateral lower leg has completely epithelialized. The area on the left lateral calf which reopened last week is improved but there is still a tiny open area that is draining. Surrounded by very dry skin and eschar 5/19; the patient has bilateral Farrow wrap stockings. Everything is epithelialized. This patient has severe lymphedema with chronic venous insufficiency. She came into the clinic not wearing her compression stockings with very large superficial blisters. Fortunately these heal fairly easily with compression wraps and standard dressings. Electronic Signature(s) Signed: 02/29/2020 5:06:12 PM By: Linton Ham MD Entered By: Linton Ham on 02/29/2020 11:46:33 [name], [name]  Jerilynn Mages ([id number]) -------------------------------------------------------------------------------- Physical Exam Details Patient Name: DANIE, HODSON Date of Service: 02/29/2020 10:45 AM Medical Record Number: [id number] Patient Account Number: [id number] Date of Birth/Sex:  1941/01/06 (79 y.o. F) Treating RN: Cornell Barman Primary Care Provider: Salome Holmes Other Clinician: Referring Provider: Salome Holmes Treating Provider/Extender: Tito Dine in Treatment: 5 Constitutional Patient is hypertensive.. Pulse regular and within target range for patient.Marland Kitchen Respirations regular, non-labored and within target range.. Temperature is normal and within the target range for the patient.Marland Kitchen appears in no distress. Cardiovascular Pedal pulses are palpable. Integumentary (Hair, Skin) Primary skin condition is seen. She has hemosiderin deposition on both sides evidence of chronic skin damage from chronic venous insufficiency with secondary lymphedema. Notes Wound exam; the patient has no open wounds. She is fully epithelialized on both sides. She has severe lymphedema with chronic venous insufficiency. Electronic Signature(s) Signed: 02/29/2020 5:06:12 PM By: Linton Ham MD Entered By: Linton Ham on 02/29/2020 11:47:52 Ozdemir, [name] ([id number]) -------------------------------------------------------------------------------- Physician Orders Details Patient Name: [name] Date of Service: 02/29/2020 10:45 AM Medical Record Number: [id number] Patient Account Number: [id number] Date of Birth/Sex: Sep 26, 1941 (79 y.o. F) Treating RN: Cornell Barman Primary Care Provider: Salome Holmes Other Clinician: Referring Provider: Salome Holmes Treating Provider/Extender: Tito Dine in Treatment: 5 Verbal / Phone Orders: No Diagnosis Coding Edema Control o Patient to wear own compression stockings Discharge From [place] o Discharge from [place] - treatment complete Electronic Signature(s) Signed: 02/29/2020 5:06:12 PM By: Linton Ham MD Signed: 03/01/2020 9:48:50 AM By: Gretta Cool, BSN, RN, CWS, Kim RN, BSN Entered By: Gretta Cool, BSN, RN, CWS, Kim on 02/29/2020 11:13:02 Santoni, [name]  ([id number]) -------------------------------------------------------------------------------- Problem List Details Patient Name: [name] Date of Service: 02/29/2020 10:45 AM Medical Record Number: [id number] Patient Account Number: [id number] Date of Birth/Sex: [birth year] (79 y.o. F) Treating RN: Cornell Barman Primary Care Provider: Salome Holmes Other Clinician: Referring Provider: Salome Holmes Treating Provider/Extender: Tito Dine in Treatment: 5 Active Problems ICD-10 Encounter Code Description Active Date MDM Diagnosis L97.811 Non-pressure chronic ulcer of other part of right lower leg limited to 01/25/2020 No Yes breakdown of skin L97.228 Non-pressure chronic ulcer of left calf with other specified severity 01/25/2020 No Yes I89.0 Lymphedema, not elsewhere classified 01/25/2020 No Yes Inactive Problems Resolved Problems Electronic Signature(s) Signed: 02/29/2020 5:06:12 PM By: Linton Ham MD Entered By: Linton Ham on 02/29/2020 11:45:30 Yurkovich, [name] ([id number]) -------------------------------------------------------------------------------- Progress Note Details Patient Name: [name] Date of Service: 02/29/2020 10:45 AM Medical Record Number: [id number] Patient Account Number: [id number] Date of Birth/Sex: 09/18/1941 (79 y.o. F) Treating RN: Cornell Barman Primary Care Provider: Salome Holmes Other Clinician: Referring Provider: Salome Holmes Treating Provider/Extender: Tito Dine in Treatment: 5 Subjective History of Present Illness (HPI) 03/05/18 on evaluation today patient presents for initial evaluation and our clinic due to significantly large blisters of the bilateral lower extremities which began initially on 02/18/18. 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 Secondarily it does appear that they felt she likely had  cellulitis. Subsequently the patient was seen for fault evaluation on 03/03/18 and that is really when the cellulitis was diagnosed. She was actually placed on doxycycline 100 mg by mouth twice a day for 10 days. With that being said she was prescribed prednisone although she did not tolerate this. Apparently she had some alteration in her mental status with  taking this. 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 She has never smoked and unfortunately does have pain at the site where these blisters are located of the bilateral lower extremities. She also does appear to have significant lymphedema which obviously has been more chronic. 03/12/18 on evaluation today patient actually appears to be doing much better in regard to her bilateral [employer] ulcers. Unfortunately a lot of the alginate dressing did stick to her legs due to the fact that everything dried up rather rapidly in the compression seems to have done very well in that regard. Unfortunately the home health nurse last time she came out to the patient's home was not able to fully remove the alginate from her leg so she just rewrapped it with what was there continuing to be in place. Obviously that does need to come off but overall I'm extremely pleased with the progress it's been made over the past week which is the Kerlex and Coban wraps. 03/18/18-She is here in follow-up evaluation for bilateral lower extremity lymphedema with blister/ulceration. She is essentially healed to the right lower extremity with one residual pinpoint opening, the left lower extremity has multiple pinpoint openings with minimal amount of serous drainage. We will continue with compression over this next week. We have requested that home help initiate the process of ordering compression wraps for long-term management. She will follow-up next week, I anticipate she will be healed at that appointment 03/26/18  on evaluation today patient appears to be doing extremely well in regard to her bilateral lower extremity ulcers. This has completely resolved for the most part on the left lower extremity and has completely resolved in regard to the right lower extremity. She's been tolerating the dressing changes without complication including the wraps. Overall her swelling is down significantly and I think this accounts for why she is doing so much better at this time. Overall she is very pleased and having very little discomfort compared to previous. 04/02/18 on evaluation today patient actually appears to be completely healed which is excellent news. She has been tolerating the wraps without complication obviously this has done very well for her. In general she shows no signs of infection or otherwise complicating factors. She's having no pain. Readmission: 06/02/2019 on evaluation today patient appears to be unfortunately experiencing issues with increased swelling of her bilateral lower extremities. I have last seen her in June 2019. At that time everything healed and it does appear that we actually got her some Velcro compression at that time. Nonetheless she states she was wearing that until about 5 weeks ago and then subsequently she quit wearing it she tells me the only excuse that she can give is "laziness". Nonetheless she is not able to get any compression sleeves/stockings on. she tells me currently that she is not experiencing any evidence of infection at this time she is been having some leaking intermittently from her lower extremities right now the left on the lateral portion is the only area open. No fevers, chills, nausea, vomiting, or diarrhea. 06/09/2019 on evaluation today patient actually appears to be doing quite well with regard to her leg ulcers in fact everything appears to be completely closed at this time bilaterally. There is no signs of active infection at this time which is good news.  Overall I am very pleased. She did bring her Velcro compression wraps with her today. READMISSION 01/25/2020 This is a now 79 year old woman  who is been in this clinic for a prolonged period of time in 2019 and more recently in 2020 with bilateral lower extremity blisters and wounds in the setting of chronic lymphedema. She is generally fairly noncompliant with compression. When she was here in 2020 she was discharged with bilateral zippered stockings but she tells as these stopped working and she has not been wearing anything for a long period of time. She came in today after developing a substantial superficial blister on the right anterior and lateral lower leg which she said started on Sunday. She has not had any pain. As noted she is not wearing compression stockings. Past medical history includes chronic myelocytic leukemia presumably in remission although I did not really see anything about this on [place] link, coronary artery disease, hypertension, lupus on hydroxychloroquine, stage III chronic renal failure with an estimated GFR of 39 based on lab work in February of this year. We did not do her ABIs today although most recently in 2020 these were 0.84 and 0.82 right and left respectively 4/21; substantial wound on the right lateral lower leg which was a humongous blister last week. She also has an area on the right lateral calf. We put her in compression with silver alginate. 4/28; substantial wound on the right lateral lower leg is epithelializing nicely. The area on the right lateral posterior calf has fully epithelialized today. We have been using silver alginate under compression. 5/5; substantial blistered area on the right lateral lower leg has epithelialized nicely there is only a small open area here. The area on the left lateral calf posteriorly is reopened. Also some drainage here. The patient has bilateral zippered extremitease stockings 5/12; the patient did not bring  her stockings this week. The substantial blistered area on the right lateral lower leg has completely epithelialized. [name], [name] ([id number]) The area on the left lateral calf which reopened last week is improved but there is still a tiny open area that is draining. Surrounded by very dry skin and eschar 5/19; the patient has bilateral Farrow wrap stockings. Everything is epithelialized. This patient has severe lymphedema with chronic venous insufficiency. She came into the clinic not wearing her compression stockings with very large superficial blisters. Fortunately these heal fairly easily with compression wraps and standard dressings. Objective Constitutional Patient is hypertensive.. Pulse regular and within target range for patient.Marland Kitchen Respirations regular, non-labored and within target range.. Temperature is normal and within the target range for the patient.Marland Kitchen appears in no distress. Vitals Time Taken: 10:55 AM, Height: 65 in, Weight: 140 lbs, BMI: 23.3, Temperature: 98.0 F, Pulse: 73 bpm, Respiratory Rate: 16 breaths/min, Blood Pressure: 218/76 mmHg. General Notes: manual recheck 198/76, MD notified Cardiovascular Pedal pulses are palpable. General Notes: Wound exam; the patient has no open wounds. She is fully epithelialized on both sides. She has severe lymphedema with chronic venous insufficiency. Integumentary (Hair, Skin) Primary skin condition is seen. She has hemosiderin deposition on both sides evidence of chronic skin damage from chronic venous insufficiency with secondary lymphedema. Wound #5 status is Open. Original cause of wound was Gradually Appeared. The wound is located on the Left,Posterior Lower Leg. The wound measures 0cm length x 0cm width x 0cm depth; 0cm^2 area and 0cm^3 volume. The wound is limited to skin breakdown. There is no tunneling or undermining noted. There is a none present amount of drainage noted. There is no granulation within the wound bed. There  is no necrotic tissue within the wound bed. Assessment Active  Problems ICD-10 Non-pressure chronic ulcer of other part of right lower leg limited to breakdown of skin Non-pressure chronic ulcer of left calf with other specified severity Lymphedema, not elsewhere classified Plan Edema Control: Patient to wear own compression stockings Discharge From [facility] Services: Discharge from [place] - treatment complete 1. The patient can be discharged from the wound care center her treatment is complete 2. I had the staff show her or at least go over with her the application of the Farrow wrap stockings. 3. I told her that these need to be applied daily after she gets up in the morning and before the day begins 4. Also lubricating her skin. 5. She is been in this position before. For 1 reason or another she does not comply. If she does this the edema will expand rapidly and she will have skin breakdown. [name], [name] ([id number]) Electronic Signature(s) Signed: 02/29/2020 5:06:12 PM By: Linton Ham MD Entered By: Linton Ham on 02/29/2020 11:55:57 Mcqueary, [name] ([id number]) -------------------------------------------------------------------------------- SuperBill Details Patient Name: [name] Date of Service: 02/29/2020 Medical Record Number: [id number] Patient Account Number: [id number] Date of Birth/Sex: 09-Feb-1941 (79 y.o. F) Treating RN: Cornell Barman Primary Care Provider: Salome Holmes Other Clinician: Referring Provider: Salome Holmes Treating Provider/Extender: Tito Dine in Treatment: 5 Diagnosis Coding ICD-10 Codes Code Description [phone] Non-pressure chronic ulcer of other part of right lower leg limited to breakdown of skin L97.228 Non-pressure chronic ulcer of left calf with other specified severity I89.0 Lymphedema, not elsewhere classified Facility Procedures CPT4 Code: [id number] Description: [phone] - WOUND CARE VISIT-LEV 2 EST  PT Modifier: Quantity: 1 Physician Procedures CPT4 Code: [id number] Description: [id number] - WC PHYS LEVEL 3 - EST PT Modifier: Quantity: 1 CPT4 Code: Description: ICD-10 Diagnosis Description L97.811 Non-pressure chronic ulcer of other part of right lower leg limited to bre L97.228 Non-pressure chronic ulcer of left calf with other specified severity I89.0 Lymphedema, not elsewhere classified Modifier: akdown of skin Quantity: Electronic Signature(s) Signed: 02/29/2020 5:06:12 PM By: Linton Ham MD Entered By: Linton Ham on 02/29/2020 11:56:26

## 2020-03-07 ENCOUNTER — Encounter: Payer: Medicare Other | Admitting: Internal Medicine

## 2020-04-10 NOTE — Progress Notes (Signed)
TARALYNN, QUIETT ([id number]) Visit Report for 02/22/2020 Debridement Details Patient Name: Kristina, [name] Date of Service: 02/22/2020 10:15 AM Medical Record Number: [id number] Patient Account Number: [id number] Date of Birth/Sex: 05/02/1941 (78 y.o. F) Treating RN: Cornell Barman Primary Care Provider: Salome Holmes Other Clinician: Referring Provider: Salome Holmes Treating Provider/Extender: Tito Dine in Treatment: 4 Debridement Performed for Wound #5 Left,Posterior Lower Leg Assessment: Performed By: Physician Ricard Dillon, MD Debridement Type: Debridement Severity of Tissue Pre Debridement: Fat layer exposed Level of Consciousness (Pre- Awake and Alert procedure): Pre-procedure Verification/Time Out Yes - 10:38 Taken: Start Time: 10:38 Pain Control: Lidocaine Total Area Debrided (L x W): 0.5 (cm) x 0.5 (cm) = 0.25 (cm) Tissue and other material Eschar, Skin: Dermis debrided: Level: Skin/Dermis Debridement Description: Selective/Open Wound Instrument: Curette Bleeding: None End Time: 10:38 Response to Treatment: Procedure was tolerated well Level of Consciousness (Post- Awake and Alert procedure): Post Debridement Measurements of Total Wound Length: (cm) 0.5 Width: (cm) 0.5 Depth: (cm) 0.1 Volume: (cm) 0.02 Character of Wound/Ulcer Post Debridement: Stable Severity of Tissue Post Debridement: Fat layer exposed Post Procedure Diagnosis Same as Pre-procedure Electronic Signature(s) Signed: 02/23/2020 7:53:18 AM By: Linton Ham MD Signed: 04/10/2020 12:53:52 PM By: Gretta Cool, BSN, RN, CWS, Kim RN, BSN Entered By: Linton Ham on 02/22/2020 12:01:31 Senna, [name] ([id number]) -------------------------------------------------------------------------------- HPI Details Patient Name: [name] Date of Service: 02/22/2020 10:15 AM Medical Record Number: [id number] Patient Account Number: [id number] Date of Birth/Sex: 1941-08-10 (78 y.o.  F) Treating RN: Cornell Barman Primary Care Provider: Salome Holmes Other Clinician: Referring Provider: Salome Holmes Treating Provider/Extender: Tito Dine in Treatment: 4 History of Present Illness HPI Description: 03/05/18 on evaluation today patient presents for initial evaluation and our clinic due to significantly large blisters of the bilateral lower extremities which began initially on 02/18/18. 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 Secondarily it does appear that they felt she likely had cellulitis. Subsequently the patient was seen for fault evaluation on 03/03/18 and that is really when the cellulitis was diagnosed. She was actually placed on doxycycline 100 mg by mouth twice a day for 10 days. With that being said she was prescribed prednisone although she did not tolerate this. Apparently she had some alteration in her mental status with taking this. 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 She has never smoked and unfortunately does have pain at the site where these blisters are located of the bilateral lower extremities. She also does appear to have significant lymphedema which obviously has been more chronic. 03/12/18 on evaluation today patient actually appears to be doing much better in regard to her bilateral [employer] ulcers. Unfortunately a lot of the alginate dressing did stick to her legs due to the fact that everything dried up rather rapidly in the compression seems to have done very well in that regard. Unfortunately the home health nurse last time she came out to the patient's home was not able to fully remove the alginate from her leg so she just rewrapped it with what was there continuing to be in place. Obviously that does need to come off but overall I'm extremely pleased with the progress  it's been made over the past week which is the Kerlex and Coban wraps. 03/18/18-She is here in follow-up evaluation for bilateral lower extremity lymphedema  with blister/ulceration. She is essentially healed to the right lower extremity with one residual pinpoint opening, the left lower extremity has multiple pinpoint openings with minimal amount of serous drainage. We will continue with compression over this next week. We have requested that home help initiate the process of ordering compression wraps for long-term management. She will follow-up next week, I anticipate she will be healed at that appointment 03/26/18 on evaluation today patient appears to be doing extremely well in regard to her bilateral lower extremity ulcers. This has completely resolved for the most part on the left lower extremity and has completely resolved in regard to the right lower extremity. She's been tolerating the dressing changes without complication including the wraps. Overall her swelling is down significantly and I think this accounts for why she is doing so much better at this time. Overall she is very pleased and having very little discomfort compared to previous. 04/02/18 on evaluation today patient actually appears to be completely healed which is excellent news. She has been tolerating the wraps without complication obviously this has done very well for her. In general she shows no signs of infection or otherwise complicating factors. She's having no pain. Readmission: 06/02/2019 on evaluation today patient appears to be unfortunately experiencing issues with increased swelling of her bilateral lower extremities. I have last seen her in June 2019. At that time everything healed and it does appear that we actually got her some Velcro compression at that time. Nonetheless she states she was wearing that until about 5 weeks ago and then subsequently she quit wearing it she tells me the only excuse that she can give is  "laziness". Nonetheless she is not able to get any compression sleeves/stockings on. she tells me currently that she is not experiencing any evidence of infection at this time she is been having some leaking intermittently from her lower extremities right now the left on the lateral portion is the only area open. No fevers, chills, nausea, vomiting, or diarrhea. 06/09/2019 on evaluation today patient actually appears to be doing quite well with regard to her leg ulcers in fact everything appears to be completely closed at this time bilaterally. There is no signs of active infection at this time which is good news. Overall I am very pleased. She did bring her Velcro compression wraps with her today. READMISSION 01/25/2020 This is a now 79 year old woman who is been in this clinic for a prolonged period of time in 2019 and more recently in 2020 with bilateral lower extremity blisters and wounds in the setting of chronic lymphedema. She is generally fairly noncompliant with compression. When she was here in 2020 she was discharged with bilateral zippered stockings but she tells as these stopped working and she has not been wearing anything for a long period of time. She came in today after developing a substantial superficial blister on the right anterior and lateral lower leg which she said started on Sunday. She has not had any pain. As noted she is not wearing compression stockings. Past medical history includes chronic myelocytic leukemia presumably in remission although I did not really see anything about this on [place] link, coronary artery disease, hypertension, lupus on hydroxychloroquine, stage III chronic renal failure with an estimated GFR of 39 based on lab work in February of this year. We did not do her ABIs today although most recently in 2020 these were 0.84 and 0.82 right and left respectively 4/21; substantial wound on the right lateral lower leg  which was a humongous blister last  week. She also has an area on the right lateral calf. We put her in compression with silver alginate. 4/28; substantial wound on the right lateral lower leg is epithelializing nicely. The area on the right lateral posterior calf has fully epithelialized today. We have been using silver alginate under compression. 5/5; substantial blistered area on the right lateral lower leg has epithelialized nicely there is only a small open area here. The area on the left lateral calf posteriorly is reopened. Also some drainage here. The patient has bilateral zippered extremitease stockings 5/12; the patient did not bring her stockings this week. The substantial blistered area on the right lateral lower leg has completely epithelialized. DELBRA, ZELLARS ([id number]) The area on the left lateral calf which reopened last week is improved but there is still a tiny open area that is draining. Surrounded by very dry skin and eschar Electronic Signature(s) Signed: 02/23/2020 7:53:18 AM By: Linton Ham MD Entered By: Linton Ham on 02/22/2020 12:02:55 Hennings, [name] ([id number]) -------------------------------------------------------------------------------- Physical Exam Details Patient Name: [name] Date of Service: 02/22/2020 10:15 AM Medical Record Number: [id number] Patient Account Number: [id number] Date of Birth/Sex: [birth year] (78 y.o. F) Treating RN: Cornell Barman Primary Care Provider: Salome Holmes Other Clinician: Referring Provider: Salome Holmes Treating Provider/Extender: Tito Dine in Treatment: 4 Constitutional Patient is hypertensive.. Pulse regular and within target range for patient.Marland Kitchen Respirations regular, non-labored and within target range.. Temperature is normal and within the target range for the patient.Marland Kitchen appears in no distress. Cardiovascular Edema control in the leg is excellent. Notes Wound exam; the blistered area on the right anterior lower leg is  contracted nicely. There is still a small open area however. Using a #3 curette I removed dry skin and eschar around the margin of this wound which should allow an adequate surface for final epithelialization. Electronic Signature(s) Signed: 02/23/2020 7:53:18 AM By: Linton Ham MD Entered By: Linton Ham on 02/22/2020 12:03:54 Rougeau, [name] ([id number]) -------------------------------------------------------------------------------- Physician Orders Details Patient Name: [name] Date of Service: 02/22/2020 10:15 AM Medical Record Number: [id number] Patient Account Number: [id number] Date of Birth/Sex: Apr 20, 1941 (78 y.o. F) Treating RN: Cornell Barman Primary Care Provider: Salome Holmes Other Clinician: Referring Provider: Salome Holmes Treating Provider/Extender: Tito Dine in Treatment: 4 Verbal / Phone Orders: No Diagnosis Coding Wound Cleansing Wound #5 Left,Posterior Lower Leg o Clean wound with Normal Saline. Anesthetic (add to Medication List) Wound #5 Left,Posterior Lower Leg o Topical Lidocaine 4% cream applied to wound bed prior to debridement (In Clinic Only). Skin Barriers/Peri-Wound Care Wound #5 Left,Posterior Lower Leg o Moisturizing lotion Primary Wound Dressing Wound #5 Left,Posterior Lower Leg o Silver Alginate Secondary Dressing Wound #5 Left,Posterior Lower Leg o ABD pad Dressing Change Frequency Wound #5 Left,Posterior Lower Leg o Change dressing every week Follow-up Appointments Wound #5 Left,Posterior Lower Leg o Return Appointment in 1 week. o Nurse Visit as needed Edema Control Wound #5 Left,Posterior Lower Leg o 4 Layer Compression System - Bilateral o Elevate legs to the level of the heart and pump ankles as often as possible Additional Orders / Instructions o Activity as tolerated Electronic Signature(s) Signed: 02/23/2020 7:53:18 AM By: Linton Ham MD Signed: 04/10/2020 12:53:52 PM By:  Gretta Cool, BSN, RN, CWS, Kim RN, BSN Entered By: Gretta Cool, BSN, RN, CWS, Kim on 02/22/2020 10:40:59 Rutt, [name] ([id number]) -------------------------------------------------------------------------------- Problem List Details Patient Name: [name] Date of Service: 02/22/2020 10:15 AM Medical Record Number:  [id number] Patient Account Number: [id number] Date of Birth/Sex: July 21, 1941 (79 y.o. F) Treating RN: Cornell Barman Primary Care Provider: Salome Holmes Other Clinician: Referring Provider: Salome Holmes Treating Provider/Extender: Tito Dine in Treatment: 4 Active Problems ICD-10 Encounter Code Description Active Date MDM Diagnosis L97.811 Non-pressure chronic ulcer of other part of right lower leg limited to 01/25/2020 No Yes breakdown of skin L97.228 Non-pressure chronic ulcer of left calf with other specified severity 01/25/2020 No Yes I89.0 Lymphedema, not elsewhere classified 01/25/2020 No Yes Inactive Problems Resolved Problems Electronic Signature(s) Signed: 02/23/2020 7:53:18 AM By: Linton Ham MD Entered By: Linton Ham on 02/22/2020 12:01:03 Schuessler, [name] ([id number]) -------------------------------------------------------------------------------- Progress Note Details Patient Name: [name] Date of Service: 02/22/2020 10:15 AM Medical Record Number: [id number] Patient Account Number: [id number] Date of Birth/Sex: 1941-02-12 (78 y.o. F) Treating RN: Cornell Barman Primary Care Provider: Salome Holmes Other Clinician: Referring Provider: Salome Holmes Treating Provider/Extender: Tito Dine in Treatment: 4 Subjective History of Present Illness (HPI) 03/05/18 on evaluation today patient presents for initial evaluation and our clinic due to significantly large blisters of the bilateral lower extremities which began initially on 02/18/18. 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 Secondarily it does appear that they felt she likely had cellulitis. Subsequently the patient was seen for fault evaluation on 03/03/18 and that is really when the cellulitis was diagnosed. She was actually placed on doxycycline 100 mg by mouth twice a day for 10 days. With that being said she was prescribed prednisone although she did not tolerate this. Apparently she had some alteration in her mental status with taking this. 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 She has never smoked and unfortunately does have pain at the site where these blisters are located of the bilateral lower extremities. She also does appear to have significant lymphedema which obviously has been more chronic. 03/12/18 on evaluation today patient actually appears to be doing much better in regard to her bilateral [employer] ulcers. Unfortunately a lot of the alginate dressing did stick to her legs due to the fact that everything dried up rather rapidly in the compression seems to have done very well in that regard. Unfortunately the home health nurse last time she came out to the patient's home was not able to fully remove the alginate from her leg so she just rewrapped it with what was there continuing to be in place. Obviously that does need to come off but overall I'm extremely pleased with the progress it's been made over the past week which is the Kerlex and Coban wraps. 03/18/18-She is here in follow-up evaluation for bilateral lower extremity lymphedema with blister/ulceration. She is essentially healed to the right lower extremity with one residual pinpoint opening, the left lower extremity has multiple pinpoint openings with minimal amount of serous drainage. We will continue with compression over this next week. We have requested that home help initiate the process of ordering compression wraps for long-term  management. She will follow-up next week, I anticipate she will be healed at that appointment 03/26/18 on evaluation today patient appears to be doing extremely well in regard to her bilateral lower extremity ulcers. This has completely resolved for the most part on the left lower extremity and has completely resolved in regard to the right lower extremity. She's been tolerating the  dressing changes without complication including the wraps. Overall her swelling is down significantly and I think this accounts for why she is doing so much better at this time. Overall she is very pleased and having very little discomfort compared to previous. 04/02/18 on evaluation today patient actually appears to be completely healed which is excellent news. She has been tolerating the wraps without complication obviously this has done very well for her. In general she shows no signs of infection or otherwise complicating factors. She's having no pain. Readmission: 06/02/2019 on evaluation today patient appears to be unfortunately experiencing issues with increased swelling of her bilateral lower extremities. I have last seen her in June 2019. At that time everything healed and it does appear that we actually got her some Velcro compression at that time. Nonetheless she states she was wearing that until about 5 weeks ago and then subsequently she quit wearing it she tells me the only excuse that she can give is "laziness". Nonetheless she is not able to get any compression sleeves/stockings on. she tells me currently that she is not experiencing any evidence of infection at this time she is been having some leaking intermittently from her lower extremities right now the left on the lateral portion is the only area open. No fevers, chills, nausea, vomiting, or diarrhea. 06/09/2019 on evaluation today patient actually appears to be doing quite well with regard to her leg ulcers in fact everything appears to be completely  closed at this time bilaterally. There is no signs of active infection at this time which is good news. Overall I am very pleased. She did bring her Velcro compression wraps with her today. READMISSION 01/25/2020 This is a now 79 year old woman who is been in this clinic for a prolonged period of time in 2019 and more recently in 2020 with bilateral lower extremity blisters and wounds in the setting of chronic lymphedema. She is generally fairly noncompliant with compression. When she was here in 2020 she was discharged with bilateral zippered stockings but she tells as these stopped working and she has not been wearing anything for a long period of time. She came in today after developing a substantial superficial blister on the right anterior and lateral lower leg which she said started on Sunday. She has not had any pain. As noted she is not wearing compression stockings. Past medical history includes chronic myelocytic leukemia presumably in remission although I did not really see anything about this on [place] link, coronary artery disease, hypertension, lupus on hydroxychloroquine, stage III chronic renal failure with an estimated GFR of 39 based on lab work in February of this year. We did not do her ABIs today although most recently in 2020 these were 0.84 and 0.82 right and left respectively 4/21; substantial wound on the right lateral lower leg which was a humongous blister last week. She also has an area on the right lateral calf. We put her in compression with silver alginate. 4/28; substantial wound on the right lateral lower leg is epithelializing nicely. The area on the right lateral posterior calf has fully epithelialized today. We have been using silver alginate under compression. 5/5; substantial blistered area on the right lateral lower leg has epithelialized nicely there is only a small open area here. The area on the left lateral calf posteriorly is reopened. Also some  drainage here. The patient has bilateral zippered extremitease stockings 5/12; the patient did not bring her stockings this week. The substantial blistered area on the  right lateral lower leg has completely epithelialized. [name], [name] ([id number]) The area on the left lateral calf which reopened last week is improved but there is still a tiny open area that is draining. Surrounded by very dry skin and eschar Objective Constitutional Patient is hypertensive.. Pulse regular and within target range for patient.Marland Kitchen Respirations regular, non-labored and within target range.. Temperature is normal and within the target range for the patient.Marland Kitchen appears in no distress. Vitals Time Taken: 10:17 AM, Height: 65 in, Weight: 140 lbs, BMI: 23.3, Temperature: 98.5 F, Pulse: 72 bpm, Respiratory Rate: 16 breaths/min, Blood Pressure: 181/84 mmHg. Cardiovascular Edema control in the leg is excellent. General Notes: Wound exam; the blistered area on the right anterior lower leg is contracted nicely. There is still a small open area however. Using a #3 curette I removed dry skin and eschar around the margin of this wound which should allow an adequate surface for final epithelialization. Integumentary (Hair, Skin) Wound #4 status is Healed - Epithelialized. Original cause of wound was Blister. The wound is located on the Right,Anterior Lower Leg. The wound measures 0cm length x 0cm width x 0cm depth; 0cm^2 area and 0cm^3 volume. There is Fat Layer (Subcutaneous Tissue) Exposed exposed. There is no tunneling or undermining noted. There is a none present amount of drainage noted. There is no granulation within the wound bed. There is a large (67-100%) amount of necrotic tissue within the wound bed including Eschar. Wound #5 status is Open. Original cause of wound was Gradually Appeared. The wound is located on the Left,Posterior Lower Leg. The wound measures 0.5cm length x 0.5cm width x 0.1cm depth; 0.196cm^2 area  and 0.02cm^3 volume. There is Fat Layer (Subcutaneous Tissue) Exposed exposed. There is no tunneling or undermining noted. There is a medium amount of serosanguineous drainage noted. There is large (67- 100%) red granulation within the wound bed. There is a small (1-33%) amount of necrotic tissue within the wound bed including Adherent Slough. 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 There was a Selective/Open Wound Skin/Dermis Debridement with a total area of 0.25 sq cm performed by Ricard Dillon, MD. With the following instrument(s): Curette Material removed includes Eschar and Skin: Dermis and after achieving pain control using Lidocaine. No specimens were taken. A time out was conducted at 10:38, prior to the start of the procedure. There was no bleeding. The procedure was tolerated well. Post Debridement Measurements: 0.5cm length x 0.5cm width x 0.1cm depth; 0.02cm^3 volume. Character of Wound/Ulcer Post Debridement is stable. Severity of Tissue Post Debridement is: Fat layer exposed. Post procedure Diagnosis Wound #5: Same as Pre-Procedure Plan Berko, Jesus M. ([id number]) Wound Cleansing: Wound #5 Left,Posterior Lower Leg: Clean wound with Normal Saline. Anesthetic (add to Medication List): Wound #5 Left,Posterior Lower Leg: Topical Lidocaine 4% cream applied to wound bed prior to debridement (In Clinic Only). Skin Barriers/Peri-Wound Care: Wound #5 Left,Posterior Lower Leg: Moisturizing lotion Primary Wound Dressing: Wound #5 Left,Posterior Lower Leg: Silver Alginate Secondary Dressing: Wound #5 Left,Posterior  Lower Leg: ABD pad Dressing Change Frequency: Wound #5 Left,Posterior Lower Leg: Change dressing every week Follow-up Appointments: Wound #5 Left,Posterior Lower Leg: Return Appointment in 1 week. Nurse Visit  as needed Edema Control: Wound #5 Left,Posterior Lower Leg: 4 Layer Compression System - Bilateral Elevate legs to the level of the heart and pump ankles as often as possible Additional Orders / Instructions: Activity as tolerated 1. We put the right lower back in compression but asked her to bring her stocking next week. We emphasized skin lubrication 2. Still using the same dressing on the left lateral leg which should be closed by next week. Silver alginate/ABDs under compression 4-layer which she tolerates well Electronic Signature(s) Signed: 02/23/2020 7:53:18 AM By: Linton Ham MD Entered By: Linton Ham on 02/22/2020 12:05:06 Lofquist, [name] ([id number]) -------------------------------------------------------------------------------- SuperBill Details Patient Name: [name] Date of Service: 02/22/2020 Medical Record Number: [id number] Patient Account Number: [id number] Date of Birth/Sex: [birth year] (78 y.o. 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 I89.0 Lymphedema, not elsewhere classified Facility Procedures CPT4 Code: 81157262 Description: 03559 - DEBRIDE WOUND 1ST 20 SQ CM OR < Modifier: Quantity: 1 CPT4 Code: Description: ICD-10 Diagnosis Description L97.228 Non-pressure chronic ulcer of left calf with other specified severity Modifier: Quantity: Physician Procedures CPT4 Code:  7416384 Description: 53646 - WC PHYS DEBR WO ANESTH 20 SQ CM Modifier: Quantity: 1 CPT4 Code: Description: ICD-10 Diagnosis Description L97.228 Non-pressure chronic ulcer of left calf with other specified severity Modifier: Quantity: Electronic Signature(s) Signed: 02/23/2020 7:53:18 AM By: Linton Ham MD Entered By: Linton Ham on 02/22/2020 12:05:22

## 2020-04-10 NOTE — Progress Notes (Signed)
Kristina, [name] ([id number]) Visit Report for 02/22/2020 Arrival Information Details Patient Name: Kristina, [name] Date of Service: 02/22/2020 10:15 AM Medical Record Number: [id number] Patient Account Number: [id number] Date of Birth/Sex: 03/04/1941 (78 y.o. F) Treating RN: Montey Hora Primary Care Lakeysha Slutsky: Salome Holmes Other Clinician: Referring Fawaz Borquez: Salome Holmes Treating Nari Vannatter/Extender: Tito Dine in Treatment: 4 Visit Information History Since Last Visit Added or deleted any medications: No Patient Arrived: Cane Any new allergies or adverse reactions: No Arrival Time: 10:13 Had a fall or experienced change in No Accompanied By: self activities of daily living that may affect Transfer Assistance: None risk of falls: Patient Identification Verified: Yes Signs or symptoms of abuse/neglect since last visito No Secondary Verification Process Completed: Yes Hospitalized since last visit: No Patient Has Alerts: Yes Implantable device outside of the clinic excluding No Patient Alerts: 06/02/19 ABI cellular tissue based products placed in the center L 0.84 R 0.82 since last visit: Has Dressing in Place as Prescribed: Yes Has Compression in Place as Prescribed: Yes Pain Present Now: No Electronic Signature(s) Signed: 02/22/2020 4:55:05 PM By: Montey Hora Entered By: Montey Hora on 02/22/2020 10:14:37 [name] ([id number]) -------------------------------------------------------------------------------- Encounter Discharge Information Details Patient Name: [name] Date of Service: 02/22/2020 10:15 AM Medical Record Number: [id number] Patient Account Number: [id number] Date of Birth/Sex: Dec 23, 1940 (78 y.o. F) Treating RN: Cornell Barman Primary Care Garnell Begeman: Salome Holmes Other Clinician: Referring Jasmine Mcbeth: Salome Holmes Treating Arnella Pralle/Extender: Tito Dine in Treatment: 4 Encounter Discharge Information Items Post  Procedure Vitals Discharge Condition: Stable Temperature (F): 98.5 Ambulatory Status: Ambulatory Pulse (bpm): 72 Discharge Destination: Home Respiratory Rate (breaths/min): 16 Transportation: Private Auto Blood Pressure (mmHg): 181/84 Accompanied By: self Schedule Follow-up Appointment: Yes Clinical Summary of Care: Electronic Signature(s) Signed: 04/10/2020 12:53:52 PM By: Gretta Cool, BSN, RN, CWS, Kim RN, BSN Entered By: Gretta Cool, BSN, RN, CWS, Kim on 02/22/2020 10:42:38 [name], [name] ([id number]) -------------------------------------------------------------------------------- Lower Extremity Assessment Details Patient Name: [name] Date of Service: 02/22/2020 10:15 AM Medical Record Number: [id number] Patient Account Number: [id number] Date of Birth/Sex: [birth year] (78 y.o. F) Treating RN: Montey Hora Primary Care Fidelia Cathers: Salome Holmes Other Clinician: Referring Coralynn Gaona: Salome Holmes Treating Sadiq Mccauley/Extender: Tito Dine in Treatment: 4 Edema Assessment Assessed: [Left: No] [Right: No] Edema: [Left: Yes] [Right: Yes] Calf Left: Right: Point of Measurement: 35 cm From Medial Instep 40.5 cm 38 cm Ankle Left: Right: Point of Measurement: 11 cm From Medial Instep 35 cm 30.5 cm Vascular Assessment Pulses: Dorsalis Pedis Palpable: [Left:Yes] [Right:Yes] Electronic Signature(s) Signed: 02/22/2020 4:55:05 PM By: Montey Hora Entered By: Montey Hora on 02/22/2020 10:21:25 [name] ([id number]) -------------------------------------------------------------------------------- Multi Wound Chart Details Patient Name: [name] Date of Service: 02/22/2020 10:15 AM Medical Record Number: [id number] Patient Account Number: [id number] Date of Birth/Sex: 11/12/1940 (78 y.o. F) Treating RN: Cornell Barman Primary Care Shawna Kiener: Salome Holmes Other Clinician: Referring Deserai Cansler: Salome Holmes Treating Hinata Diener/Extender: Tito Dine in  Treatment: 4 Vital Signs Height(in): 65 Pulse(bpm): 61 Weight(lbs): 140 Blood Pressure(mmHg): 181/84 Body Mass Index(BMI): 23 Temperature(F): 98.5 Respiratory Rate(breaths/min): 16 Photos: [N/A:N/A] Wound Location: Right, Anterior Lower Leg Left, Posterior Lower Leg N/A Wounding Event: Blister Gradually Appeared N/A Primary Etiology: Venous Leg Ulcer Venous Leg Ulcer N/A Comorbid History: Chronic sinus problems/congestion, Chronic sinus problems/congestion, N/A Anemia, Lymphedema, Angina, Anemia, Lymphedema, Angina, Arrhythmia, Hypertension, Gout, Arrhythmia, Hypertension, Gout, Rheumatoid Arthritis, Osteoarthritis Rheumatoid Arthritis, Osteoarthritis Date Acquired: 01/22/2020 01/21/2020 N/A Weeks of Treatment: 4 4 N/A Wound Status: Healed -  Epithelialized Open N/A Measurements L x W x D (cm) 0x0x0 0.5x0.5x0.1 N/A Area (cm) : 0 0.196 N/A Volume (cm) : 0 0.02 N/A % Reduction in Area: 100.00% 97.10% N/A % Reduction in Volume: 100.00% 97.10% N/A Classification: Full Thickness Without Exposed Full Thickness Without Exposed N/A Support Structures Support Structures Exudate Amount: None Present Medium N/A Exudate Type: N/A Serosanguineous N/A Exudate Color: N/A red, brown N/A Granulation Amount: None Present (0%) Large (67-100%) N/A Granulation Quality: N/A Red N/A Necrotic Amount: Large (67-100%) Small (1-33%) N/A Necrotic Tissue: Eschar Adherent Slough N/A Exposed Structures: Fat Layer (Subcutaneous Tissue) Fat Layer (Subcutaneous Tissue) N/A Exposed: Yes Exposed: Yes Fascia: No Tendon: No Muscle: No Joint: No Bone: No Epithelialization: Large (67-100%) Large (67-100%) N/A Debridement: N/A Debridement - Selective/Open N/A Wound Pre-procedure Verification/Time N/A 10:38 N/A Out Taken: Pain Control: N/A Lidocaine N/A Tissue Debrided: N/A Necrotic/Eschar N/A Level: N/A Skin/Dermis N/A Debridement Area (sq cm): N/A 0.25 N/A Instrument: N/A Curette N/A Bleeding: N/A None  N/A N/A Procedure was tolerated well N/A [name], [name] ([id number]) Debridement Treatment Response: Post Debridement N/A 0.5x0.5x0.1 N/A Measurements L x W x D (cm) Post Debridement Volume: N/A 0.02 N/A (cm) Procedures Performed: N/A Debridement N/A Treatment Notes Wound #5 (Left, Posterior Lower Leg) Notes Sivercell 4 layer bilateral Electronic Signature(s) Signed: 02/23/2020 7:53:18 AM By: Linton Ham MD Entered By: Linton Ham on 02/22/2020 12:01:20 Tedeschi, [name] ([id number]) -------------------------------------------------------------------------------- Multi-Disciplinary Care Plan Details Patient Name: [name] Date of Service: 02/22/2020 10:15 AM Medical Record Number: [id number] Patient Account Number: [id number] Date of Birth/Sex: Jan 25, 1941 (78 y.o. F) Treating RN: Cornell Barman Primary Care Ivanna Kocak: Salome Holmes Other Clinician: Referring Tiamarie Furnari: Salome Holmes Treating Angelys Yetman/Extender: Tito Dine in Treatment: 4 Active Inactive Orientation to the Wound Care Program Nursing Diagnoses: Knowledge deficit related to the wound healing center program Goals: Patient/caregiver will verbalize understanding of the [facility] Program Date Initiated: 01/25/2020 Target Resolution Date: 02/01/2020 Goal Status: Active Interventions: Provide education on orientation to the wound center Notes: Wound/Skin Impairment Nursing Diagnoses: Impaired tissue integrity Goals: Ulcer/skin breakdown will have a volume reduction of 30% by week 4 Date Initiated: 01/25/2020 Target Resolution Date: 02/24/2020 Goal Status: Active Interventions: Assess ulceration(s) every visit Treatment Activities: Topical wound management initiated : 01/25/2020 Notes: Electronic Signature(s) Signed: 04/10/2020 12:53:52 PM By: Gretta Cool, BSN, RN, CWS, Kim RN, BSN Entered By: Gretta Cool, BSN, RN, CWS, Kim on 02/22/2020 10:37:55 [name]  ([id number]) -------------------------------------------------------------------------------- Pain Assessment Details Patient Name: [name] Date of Service: 02/22/2020 10:15 AM Medical Record Number: [id number] Patient Account Number: [id number] Date of Birth/Sex: June 06, 1941 (78 y.o. F) Treating RN: Montey Hora Primary Care Camber Ninh: Salome Holmes Other Clinician: Referring Dayle Sherpa: Salome Holmes Treating Jerita Wimbush/Extender: Tito Dine in Treatment: 4 Active Problems Location of Pain Severity and Description of Pain Patient Has Paino No Site Locations Pain Management and Medication Current Pain Management: Electronic Signature(s) Signed: 02/22/2020 4:55:05 PM By: Montey Hora Entered By: Montey Hora on 02/22/2020 10:15:10 Degracia, [name] ([id number]) -------------------------------------------------------------------------------- Patient/Caregiver Education Details Patient Name: [name] Date of Service: 02/22/2020 10:15 AM Medical Record Number: [id number] Patient Account Number: [id number] Date of Birth/Gender: 02/25/1941 (78 y.o. F) Treating RN: Cornell Barman Primary Care Physician: Salome Holmes Other Clinician: Referring Physician: Salome Holmes Treating Physician/Extender: Tito Dine in Treatment: 4 Education Assessment Education Provided To: Patient Education Topics Provided Venous: Handouts: Controlling Swelling with Multilayered Compression Wraps Methods: Demonstration, Explain/Verbal Responses: State content correctly Electronic Signature(s) Signed: 04/10/2020 12:53:52  PM By: Gretta Cool, BSN, RN, CWS, Kim RN, BSN Entered By: Gretta Cool, BSN, RN, CWS, Kim on 02/22/2020 10:41:53 [name] ([id number]) -------------------------------------------------------------------------------- Wound Assessment Details Patient Name: [name] Date of Service: 02/22/2020 10:15 AM Medical Record Number: [id number] Patient Account  Number: [id number] Date of Birth/Sex: 13-Feb-1941 (78 y.o. F) Treating RN: Cornell Barman Primary Care Ciela Mahajan: Salome Holmes Other Clinician: Referring Tyrese Ficek: Salome Holmes Treating Michail Boyte/Extender: Tito Dine in Treatment: 4 Wound Status Wound Number: 4 Primary Venous Leg Ulcer Etiology: Wound Location: Right, Anterior Lower Leg Wound Healed - Epithelialized Wounding Event: Blister Status: Date Acquired: 01/22/2020 Comorbid Chronic sinus problems/congestion, Anemia, Lymphedema, Weeks Of Treatment: 4 History: Angina, Arrhythmia, Hypertension, Gout, Rheumatoid Clustered Wound: No Arthritis, Osteoarthritis Photos Wound Measurements Length: (cm) 0 Width: (cm) 0 Depth: (cm) 0 Area: (cm) 0 Volume: (cm) 0 % Reduction in Area: 100% % Reduction in Volume: 100% Epithelialization: Large (67-100%) Tunneling: No Undermining: No Wound Description Classification: Full Thickness Without Exposed Support Structure Exudate Amount: None Present s Foul Odor After Cleansing: No Slough/Fibrino No Wound Bed Granulation Amount: None Present (0%) Exposed Structure Necrotic Amount: Large (67-100%) Fascia Exposed: No Necrotic Quality: Eschar Fat Layer (Subcutaneous Tissue) Exposed: Yes Tendon Exposed: No Muscle Exposed: No Joint Exposed: No Bone Exposed: No Electronic Signature(s) Signed: 04/10/2020 12:53:52 PM By: Gretta Cool, BSN, RN, CWS, Kim RN, BSN Entered By: Gretta Cool, BSN, RN, CWS, Kim on 02/22/2020 10:37:04 [name] ([id number]) -------------------------------------------------------------------------------- Wound Assessment Details Patient Name: [name] Date of Service: 02/22/2020 10:15 AM Medical Record Number: [id number] Patient Account Number: [id number] Date of Birth/Sex: [birth year] (78 y.o. F) Treating RN: Montey Hora Primary Care Sabrie Moritz: Salome Holmes Other Clinician: Referring Trayven Lumadue: Salome Holmes Treating Erick Oxendine/Extender: Tito Dine in Treatment: 4 Wound Status Wound Number: 5 Primary Venous Leg Ulcer Etiology: Wound Location: Left, Posterior Lower Leg Wound Open Wounding Event: Gradually Appeared Status: Date Acquired: 01/21/2020 Comorbid Chronic sinus problems/congestion, Anemia, Lymphedema, Weeks Of Treatment: 4 History: Angina, Arrhythmia, Hypertension, Gout, Rheumatoid Clustered Wound: No Arthritis, Osteoarthritis Photos Wound Measurements Length: (cm) 0.5 Width: (cm) 0.5 Depth: (cm) 0.1 Area: (cm) 0.196 Volume: (cm) 0.02 % Reduction in Area: 97.1% % Reduction in Volume: 97.1% Epithelialization: Large (67-100%) Tunneling: No Undermining: No Wound Description Classification: Full Thickness Without Exposed Support Structu Exudate Amount: Medium Exudate Type: Serosanguineous Exudate Color: red, brown res Foul Odor After Cleansing: No Wound Bed Granulation Amount: Large (67-100%) Exposed Structure Granulation Quality: Red Fat Layer (Subcutaneous Tissue) Exposed: Yes Necrotic Amount: Small (1-33%) Necrotic Quality: Adherent Therapist, music) Signed: 02/22/2020 4:55:05 PM By: Montey Hora Entered By: Montey Hora on 02/22/2020 10:26:31 [name] ([id number]) -------------------------------------------------------------------------------- Vitals Details Patient Name: [name] Date of Service: 02/22/2020 10:15 AM Medical Record Number: [id number] Patient Account Number: [id number] Date of Birth/Sex: April 15, 1941 (79 y.o. F) Treating RN: Montey Hora Primary Care Satine Hausner: Salome Holmes Other Clinician: Referring Lindamarie Maclachlan: Salome Holmes Treating Pranish Akhavan/Extender: Tito Dine in Treatment: 4 Vital Signs Time Taken: 10:17 Temperature (F): 98.5 Height (in): 65 Pulse (bpm): 72 Weight (lbs): 140 Respiratory Rate (breaths/min): 16 Body Mass Index (BMI): 23.3 Blood Pressure (mmHg): 181/84 Reference Range: 80 - 120 mg / dl Electronic  Signature(s) Signed: 02/22/2020 4:55:05 PM By: Montey Hora Entered By: Montey Hora on 02/22/2020 10:17:34

## 2020-05-16 ENCOUNTER — Inpatient Hospital Stay: Payer: Medicare Other

## 2020-05-16 ENCOUNTER — Other Ambulatory Visit: Payer: Self-pay

## 2020-05-16 ENCOUNTER — Inpatient Hospital Stay: Payer: Medicare Other | Attending: Hematology and Oncology | Admitting: Hematology and Oncology

## 2020-05-16 ENCOUNTER — Encounter: Payer: Self-pay | Admitting: Hematology and Oncology

## 2020-05-16 VITALS — BP 177/86 | HR 59 | Temp 98.0°F | Resp 19 | Wt 139.0 lb

## 2020-05-16 DIAGNOSIS — G4700 Insomnia, unspecified: Secondary | ICD-10-CM | POA: Diagnosis not present

## 2020-05-16 DIAGNOSIS — N189 Chronic kidney disease, unspecified: Secondary | ICD-10-CM | POA: Insufficient documentation

## 2020-05-16 DIAGNOSIS — R779 Abnormality of plasma protein, unspecified: Secondary | ICD-10-CM

## 2020-05-16 DIAGNOSIS — D693 Immune thrombocytopenic purpura: Secondary | ICD-10-CM | POA: Diagnosis not present

## 2020-05-16 DIAGNOSIS — D649 Anemia, unspecified: Secondary | ICD-10-CM | POA: Diagnosis not present

## 2020-05-16 DIAGNOSIS — C9310 Chronic myelomonocytic leukemia not having achieved remission: Secondary | ICD-10-CM | POA: Diagnosis present

## 2020-05-16 DIAGNOSIS — I129 Hypertensive chronic kidney disease with stage 1 through stage 4 chronic kidney disease, or unspecified chronic kidney disease: Secondary | ICD-10-CM | POA: Insufficient documentation

## 2020-05-16 DIAGNOSIS — E8809 Other disorders of plasma-protein metabolism, not elsewhere classified: Secondary | ICD-10-CM

## 2020-05-16 LAB — CBC WITH DIFFERENTIAL/PLATELET
Abs Immature Granulocytes: 0 10*3/uL (ref 0.00–0.07)
Basophils Absolute: 0.1 10*3/uL (ref 0.0–0.1)
Basophils Relative: 1 %
Eosinophils Absolute: 0.1 10*3/uL (ref 0.0–0.5)
Eosinophils Relative: 1 %
HCT: 39.7 % (ref 36.0–46.0)
Hemoglobin: 12 g/dL (ref 12.0–15.0)
Lymphocytes Relative: 7 %
Lymphs Abs: 0.8 10*3/uL (ref 0.7–4.0)
MCH: 28.8 pg (ref 26.0–34.0)
MCHC: 30.2 g/dL (ref 30.0–36.0)
MCV: 95.2 fL (ref 80.0–100.0)
Monocytes Absolute: 2.4 10*3/uL — ABNORMAL HIGH (ref 0.1–1.0)
Monocytes Relative: 21 %
Neutro Abs: 7.9 10*3/uL — ABNORMAL HIGH (ref 1.7–7.7)
Neutrophils Relative %: 70 %
Platelets: 131 10*3/uL — ABNORMAL LOW (ref 150–400)
RBC Morphology: NONE SEEN
RBC: 4.17 MIL/uL (ref 3.87–5.11)
RDW: 16.2 % — ABNORMAL HIGH (ref 11.5–15.5)
WBC: 11.3 10*3/uL — ABNORMAL HIGH (ref 4.0–10.5)
nRBC: 0 % (ref 0.0–0.2)

## 2020-05-16 LAB — COMPREHENSIVE METABOLIC PANEL
ALT: 12 U/L (ref 0–44)
AST: 12 U/L — ABNORMAL LOW (ref 15–41)
Albumin: 3.8 g/dL (ref 3.5–5.0)
Alkaline Phosphatase: 44 U/L (ref 38–126)
Anion gap: 10 (ref 5–15)
BUN: 40 mg/dL — ABNORMAL HIGH (ref 8–23)
CO2: 31 mmol/L (ref 22–32)
Calcium: 9.2 mg/dL (ref 8.9–10.3)
Chloride: 100 mmol/L (ref 98–111)
Creatinine, Ser: 1.74 mg/dL — ABNORMAL HIGH (ref 0.44–1.00)
GFR calc Af Amer: 32 mL/min — ABNORMAL LOW (ref >60)
GFR calc non Af Amer: 28 mL/min — ABNORMAL LOW (ref >60)
Glucose, Bld: 87 mg/dL (ref 70–99)
Potassium: 3.8 mmol/L (ref 3.5–5.1)
Sodium: 141 mmol/L (ref 135–145)
Total Bilirubin: 1.1 mg/dL (ref 0.3–1.2)
Total Protein: 8.5 g/dL — ABNORMAL HIGH (ref 6.5–8.1)

## 2020-05-16 LAB — LACTATE DEHYDROGENASE: LDH: 124 U/L (ref 98–192)

## 2020-05-16 NOTE — Progress Notes (Signed)
Falls asleep sitting in chair while taking medications right after waking up and falls asleep in afternoon too. Then doesn't sleep well at night. Had some SOB that has resolved. BP 177/86. Recheck BP: 187/86.

## 2020-05-16 NOTE — Progress Notes (Signed)
[facility]  [address] [place], [place] [address] Phone: [phone]  Fax: [phone]   Clinic Day:  05/16/2020  Referring physician: [name], MD  Chief Complaint: [name] is a 79 y.o. 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  HPI: The patient was last seen in the medical oncology clinic on 11/16/2019 for new patient assessment. At that time, she has had some pruritus and bruising over her left upper arm.  She denied any fever, sweats or weight loss.  Exam reveals no adenopathy or hepatosplenomegaly. Hematocrit was 39.2, hemoglobin 11.8, platelets 115,000, WBC 11,300 (ANC 6,700). BUN was 38 and creatinine 1.49 (CrCl 39 ml/min).  LDH was 119. There were no indications for treatment.  She is followed weekly by Dr. Dellia Nims for wounds on her legs.  During the interim, she has been sleepy. She wakes up in the morning around 10:00am. After breakfast, she goes to take her medication and falls asleep with the medicine in her hand. She also gets tired in the afternoon. Because she sleeps during the day, she has trouble falling asleep at night.   The patient still has occasional shortness of breath on exertion. She is not getting her legs wrapped anymore and her leg swelling has improved. She is constipated. Her heartburn and bruising has resolved. Her back and joint pain is "ok".  She denies nausea, vomiting and diarrhea.  The patient took her BP medication right before she came in today. She does not take her BP at home.   Past Medical History:  Diagnosis Date  . Allergic rhinitis   . Arthritis   . CML (chronic myelocytic leukemia) ([place])   . GERD (gastroesophageal reflux disease)   . Gout   . History of goiter   . Hyperlipidemia   . Hypertension   . Hypothyroidism   . IDA (iron deficiency anemia)   . ITP (idiopathic thrombocytopenic purpura)   . Urinary incontinence     Past  Surgical History:  Procedure Laterality Date  . INCISION AND DRAINAGE Right 06/05/2015   Procedure: INCISION AND DRAINAGE, RIGHT WRIST;  Surgeon: Milly Jakob, MD;  Location: [facility];  Service: Orthopedics;  Laterality: Right;    Family History  Problem Relation Age of Onset  . Heart attack Mother   . Hypertension Mother   . Cancer Father   . Hypertension Father   . Breast cancer Paternal Grandmother   . Diabetes Mellitus II Sister     Social History:  reports that she has never smoked. She has never used smokeless tobacco. She reports that she does not drink alcohol and does not use drugs. She denies any alcohol or tobacco use. She denies any exposure to radiation or toxins. She is retired but used to work in a [employer]. She lives in [place]. The patient is alone  today.  Allergies:  Allergies  Allergen Reactions  . Sulfa Antibiotics Rash    Katherina Right Syndrome  . Sulfamethoxazole-Trimethoprim Hives and Rash    Avel Sensor   . Aspirin Rash    Current Medications: Current Outpatient Medications  Medication Sig Dispense Refill  . acetaminophen (TYLENOL) 650 MG CR tablet Take 1 tablet (650 mg total) by mouth every 6 (six) hours. 30 tablet 0  . amLODipine (NORVASC) 10 MG tablet Take 10 mg by mouth daily.     . Calcium Carbonate-Vitamin D (CALCIUM-VITAMIN D) 500-200 MG-UNIT tablet Take 1 tablet by mouth daily.    Marland Kitchen  Cholecalciferol (VITAMIN D3) 1000 units CAPS Take 1 capsule by mouth 1 day or 1 dose.    . docusate sodium (COLACE) 100 MG capsule Take 100 mg by mouth 2 (two) times daily.    Marland Kitchen ethacrynic acid (EDECRIN) 25 MG tablet Take 1 tablet by mouth daily.    . febuxostat (ULORIC) 40 MG tablet Take 40 mg by mouth daily.     . Fish Oil-Cholecalciferol (FISH OIL + D3 PO) Take by mouth daily.    . fluticasone (FLONASE) 50 MCG/ACT nasal spray Place 1 spray into both nostrils daily. Reported on 11/20/4130  11  . folic acid (FOLVITE) 1 MG tablet Take 1 tablet (1 mg total) by  mouth daily. 30 tablet 0  . hydrALAZINE (APRESOLINE) 25 MG tablet Take 25 mg by mouth daily.     . hydroxychloroquine (PLAQUENIL) 200 MG tablet Take 200 mg by mouth daily.    Marland Kitchen ipratropium (ATROVENT) 0.06 % nasal spray Place 2 sprays into both nostrils as needed. Head cold symptoms    . losartan (COZAAR) 25 MG tablet Take 25 mg by mouth daily.     . Multiple Vitamin (MULTI-VITAMIN) tablet Take 1 tablet by mouth daily.     . Multiple Vitamins-Minerals (ICAPS) CAPS Take 1 capsule by mouth 1 day or 1 dose.    . oxybutynin (DITROPAN) 5 MG tablet Take 5 mg by mouth daily.  5  . pantoprazole (PROTONIX) 40 MG tablet Take 40 mg by mouth daily before breakfast.    . Polyethyl Glycol-Propyl Glycol (SYSTANE) 0.4-0.3 % SOLN Apply to eye.    . potassium chloride (K-DUR,KLOR-CON) 10 MEQ tablet Take 1 tablet by mouth daily.  11  . potassium chloride (KLOR-CON) 10 MEQ tablet Take 10 mEq by mouth daily.     . chlorthalidone (HYGROTON) 25 MG tablet Take 0.5 tablets by mouth daily.    . metoprolol tartrate (LOPRESSOR) 25 MG tablet Take 25 mg by mouth 2 (two) times daily. Reported on 12/18/2015     No current facility-administered medications for this visit.    Review of Systems  Constitutional: Positive for weight loss (2 lbs). Negative for chills, diaphoresis, fever and malaise/fatigue.       Feels "sleepy".  HENT: Negative.  Negative for congestion, ear discharge, ear pain, hearing loss, nosebleeds, sinus pain, sore throat and tinnitus.   Eyes: Negative.  Negative for blurred vision, double vision and photophobia.  Respiratory: Positive for shortness of breath (with exertion, occasional). Negative for cough, sputum production and wheezing.   Cardiovascular: Positive for leg swelling (improving). Negative for chest pain, palpitations, orthopnea and PND.  Gastrointestinal: Positive for constipation. Negative for abdominal pain, blood in stool, diarrhea, heartburn, melena, nausea and vomiting.  Genitourinary:  Negative for dysuria, frequency, hematuria and urgency.  Musculoskeletal: Positive for back pain and joint pain (arthritic, hands, wrists, legs). Negative for myalgias and neck pain.  Skin: Negative for itching and rash.  Neurological: Negative.  Negative for dizziness, tingling, sensory change, speech change, focal weakness, weakness and headaches.  Endo/Heme/Allergies: Does not bruise/bleed easily.  Psychiatric/Behavioral: Negative for depression, memory loss and substance abuse. The patient has insomnia (due to naps throughout the day). The patient is not nervous/anxious.   All other systems reviewed and are negative.  Performance status (ECOG): 1  Vitals Blood pressure (!) 177/86, pulse (!) 59, temperature 98 F (36.7 C), temperature source Tympanic, resp. rate 19, weight 139 lb (63 kg), SpO2 97 %.   Physical Exam Vitals and nursing note reviewed.  Constitutional:      General: She is not in acute distress.    Appearance: She is well-developed. She is not diaphoretic.     Comments: She has a cane at her side.  She needs assistance onto the examination table.  HENT:     Head: Normocephalic and atraumatic.     Comments: Short gray hair.    Mouth/Throat:     Pharynx: No oropharyngeal exudate.  Eyes:     General: No scleral icterus.    Extraocular Movements: Extraocular movements intact.     Conjunctiva/sclera: Conjunctivae normal.     Pupils: Pupils are equal, round, and reactive to light.     Comments: Brown eyes.  Neck:     Vascular: No JVD.  Cardiovascular:     Rate and Rhythm: Normal rate and regular rhythm.     Heart sounds: Normal heart sounds. No murmur heard.   Pulmonary:     Effort: Pulmonary effort is normal. No respiratory distress.     Breath sounds: No wheezing or rales.     Comments: Decreased breath sounds at the bases. Abdominal:     General: Bowel sounds are normal. There is no distension.     Palpations: Abdomen is soft. There is splenomegaly (spleen just  palpable). There is no hepatomegaly or mass.     Tenderness: There is no abdominal tenderness. There is no guarding or rebound.  Musculoskeletal:        General: Tenderness (BLE) present. Normal range of motion.     Cervical back: Normal range of motion and neck supple.     Right lower leg: Edema (3+) present.     Left lower leg: Edema (3++) present.     Comments: Right hand flexed.  Lymphadenopathy:     Head:     Right side of head: No preauricular, posterior auricular or occipital adenopathy.     Left side of head: No preauricular, posterior auricular or occipital adenopathy.     Cervical: No cervical adenopathy.     Upper Body:     Right upper body: No supraclavicular or axillary adenopathy.     Left upper body: No supraclavicular or axillary adenopathy.     Lower Body: No right inguinal adenopathy. No left inguinal adenopathy.  Skin:    General: Skin is warm and dry.     Findings: Ecchymosis present.  Neurological:     Mental Status: She is alert and oriented to person, place, and time.  Psychiatric:        Behavior: Behavior normal.        Thought Content: Thought content normal.        Judgment: Judgment normal.    Appointment on 05/16/2020  Component Date Value Ref Range Status  . LDH 05/16/2020 124  98 - 192 U/L Final   Performed at [facility], [address] [place], [place] [address]  . Sodium 05/16/2020 141  135 - 145 mmol/L Final  . Potassium 05/16/2020 3.8  3.5 - 5.1 mmol/L Final  . Chloride 05/16/2020 100  98 - 111 mmol/L Final  . CO2 05/16/2020 31  22 - 32 mmol/L Final  . Glucose, Bld 05/16/2020 87  70 - 99 mg/dL Final   Glucose reference range applies only to samples taken after fasting for at least 8 hours.  . BUN 05/16/2020 40* 8 - 23 mg/dL Final  . Creatinine, Ser 05/16/2020 1.74* 0.44 - 1.00 mg/dL Final  . Calcium 05/16/2020 9.2  8.9 - 10.3  mg/dL Final  . Total Protein 05/16/2020 8.5* 6.5 - 8.1 g/dL Final  . Albumin 05/16/2020 3.8  3.5 -  5.0 g/dL Final  . AST 05/16/2020 12* 15 - 41 U/L Final  . ALT 05/16/2020 12  0 - 44 U/L Final  . Alkaline Phosphatase 05/16/2020 44  38 - 126 U/L Final  . Total Bilirubin 05/16/2020 1.1  0.3 - 1.2 mg/dL Final  . GFR calc non Af Amer 05/16/2020 28* >60 mL/min Final  . GFR calc Af Amer 05/16/2020 32* >60 mL/min Final  . Anion gap 05/16/2020 10  5 - 15 Final   Performed at [facility], [address], [place], [place] [address]    Assessment:  [name] is a 79 y.o. 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 She presented with weight loss. WBC has ranged 25,000 - 30,000 with predominant monocytosis.   Bone marrow aspirate and biopsy on 08/22/2015 revealed an increased atypical monocytic cells (30%) with multi-lineage dysplaisa.  There was no significant myeloid abnormalities or increase in blasts (< 5%).   Cytogenetics were normal (78, XX).  Findings favored a diagnosis of CMML.   She has a history of chronic normocytic anemia felt secondary to chronic kidney disease (CKD).   She has previously been treated with Venofer and blood transfusions. She has received Venofer on 03/06/2015 and 03/28/2015.  Ferritin has been followed: 270 on 04/27/2013, 293 on 04/26/2013, 286 on 06/16/2014, 430 on 02/13/2015, 393 on 03/20/2015, 524 on 04/05/2015, 373 on 04/24/2015, 495 on 05/15/2015, and 659 on 07/03/2015.   She has a history of idiopathic thrombocytopenia purpura (ITP).  Platelet count has ranged between 90,000 - 130,000.  She was previously treated with prednisone.   Symptomatically, she feels "good".  She is falls asleep easily.  She is no longer wrapping her lower extremities but using compressions socks.  She denies any bleeding.  Plan: 1.   Labs today: CBC with diff, CMP, LDH.  2.   Chronic myelomonocytic leukemia (CMML)               Hematocrit 39.7.  Hemoglobin 12.0.  MCV 95.2.  Platelets 131,000.  WBC 11,300 (21% monocytes; absolute monocyte count 2400).  Marrow and labs are  consistent with CMM-0.             Patient has low risk disease.                         Risk factors:  age (> 23), WBC (> 15,000), hemoglobin (< 10), platelet count (< 100,000), ASXL mutation.                         Risk factors for patient include age only.             Occasions for treatment include:                         B symptoms, change in counts (increased WBC/blasts, increased cytopenias), and organ involvement.                         Treatment typically consists of hydroxyurea or hypomethylating agents.             No current indication for treatment.  Continue surveillance. 3.   Elevated protein  Serum protein is 8.5 (6.5-8.1).  Check SPEP. 4.   Elevated blood  pressure  BP 177/86 today.  Patient believes she has whitecoat hypertension.  Recheck blood pressure. 5.   RN to repeat BP and contact PCP. 6.   RN to call patient with SPEP results. 7.   RTC in 6 months for MD assessment and labs (CBC with diff, CMP, LDH, uric acid).  I discussed the assessment and treatment plan with the patient.  The patient was provided an opportunity to ask questions and all were answered.  The patient agreed with the plan and demonstrated an understanding of the instructions.  The patient was advised to call back if the symptoms worsen or if the condition fails to improve as anticipated.  I provided 15 minutes of face-to-face time during this this encounter and > 50% was spent counseling as documented under my assessment and plan.   Lequita Asal, MD, PhD    05/16/2020, 11:52 AM  I, Evert Kohl, am acting as a Education administrator for Lequita Asal, MD.  I, Stout Mike Gip, MD, have reviewed the above documentation for accuracy and completeness, and I agree with the above.

## 2020-05-18 LAB — PROTEIN ELECTROPHORESIS, SERUM
A/G Ratio: 0.9 (ref 0.7–1.7)
Albumin ELP: 3.9 g/dL (ref 2.9–4.4)
Alpha-1-Globulin: 0.2 g/dL (ref 0.0–0.4)
Alpha-2-Globulin: 0.6 g/dL (ref 0.4–1.0)
Beta Globulin: 1.3 g/dL (ref 0.7–1.3)
Gamma Globulin: 2 g/dL — ABNORMAL HIGH (ref 0.4–1.8)
Globulin, Total: 4.2 g/dL — ABNORMAL HIGH (ref 2.2–3.9)
Total Protein ELP: 8.1 g/dL (ref 6.0–8.5)

## 2020-06-22 ENCOUNTER — Other Ambulatory Visit: Payer: Self-pay

## 2020-06-22 ENCOUNTER — Encounter: Payer: Medicare Other | Attending: Physician Assistant | Admitting: Physician Assistant

## 2020-06-22 ENCOUNTER — Other Ambulatory Visit
Admission: RE | Admit: 2020-06-22 | Discharge: 2020-06-22 | Disposition: A | Discharge status: Home / Self Care | Payer: Medicare Other | Source: Ambulatory Visit | Attending: Physician Assistant | Admitting: Physician Assistant

## 2020-06-22 DIAGNOSIS — I890 Lymphedema, not elsewhere classified: Secondary | ICD-10-CM | POA: Diagnosis not present

## 2020-06-22 DIAGNOSIS — Z882 Allergy status to sulfonamides status: Secondary | ICD-10-CM | POA: Insufficient documentation

## 2020-06-22 DIAGNOSIS — N183 Chronic kidney disease, stage 3 unspecified: Secondary | ICD-10-CM | POA: Insufficient documentation

## 2020-06-22 DIAGNOSIS — I872 Venous insufficiency (chronic) (peripheral): Secondary | ICD-10-CM | POA: Insufficient documentation

## 2020-06-22 DIAGNOSIS — L97822 Non-pressure chronic ulcer of other part of left lower leg with fat layer exposed: Secondary | ICD-10-CM | POA: Diagnosis not present

## 2020-06-22 DIAGNOSIS — B999 Unspecified infectious disease: Secondary | ICD-10-CM | POA: Insufficient documentation

## 2020-06-22 DIAGNOSIS — Z79899 Other long term (current) drug therapy: Secondary | ICD-10-CM | POA: Insufficient documentation

## 2020-06-22 DIAGNOSIS — I129 Hypertensive chronic kidney disease with stage 1 through stage 4 chronic kidney disease, or unspecified chronic kidney disease: Secondary | ICD-10-CM | POA: Insufficient documentation

## 2020-06-22 DIAGNOSIS — Z9119 Patient's noncompliance with other medical treatment and regimen: Secondary | ICD-10-CM | POA: Diagnosis not present

## 2020-06-25 ENCOUNTER — Other Ambulatory Visit: Payer: Self-pay

## 2020-06-25 ENCOUNTER — Encounter: Payer: Medicare Other | Admitting: Physician Assistant

## 2020-06-25 DIAGNOSIS — L97822 Non-pressure chronic ulcer of other part of left lower leg with fat layer exposed: Secondary | ICD-10-CM | POA: Diagnosis not present

## 2020-06-25 LAB — AEROBIC CULTURE W GRAM STAIN (SUPERFICIAL SPECIMEN)

## 2020-06-25 NOTE — Progress Notes (Signed)
Kristina, [name] ([id number]) Visit Report for 06/25/2020 Arrival Information Details Patient Name: [name], [name] Date of Service: 06/25/2020 2:00 PM Medical Record Number: [id number] Patient Account Number: [id number] Date of Birth/Sex: July 17, 1941 (79 y.o. F) Treating RN: Grover Canavan Primary Care Jachob Mcclean: Salome Holmes Other Clinician: Referring Lekeith Wulf: Salome Holmes Treating Avalene Sealy/Extender: Melburn Hake, HOYT Weeks in Treatment: 0 Visit Information History Since Last Visit Added or deleted any medications: No Patient Arrived: Wheel Chair Had a fall or experienced change in No Arrival Time: 15:07 activities of daily living that may affect Accompanied By: self risk of falls: Transfer Assistance: None Signs or symptoms of abuse/neglect since last visito No Patient Requires Transmission-Based Precautions: No Hospitalized since last visit: No Patient Has Alerts: No Has Dressing in Place as Prescribed: Yes Pain Present Now: No Electronic Signature(s) Signed: 06/25/2020 4:32:44 PM By: Grover Canavan Entered By: Grover Canavan on 06/25/2020 15:07:48 Chinchilla, [name] ([id number]) -------------------------------------------------------------------------------- Clinic Level of Care Assessment Details Patient Name: [name] Date of Service: 06/25/2020 2:00 PM Medical Record Number: [id number] Patient Account Number: [id number] Date of Birth/Sex: 01-15-1941 (79 y.o. F) Treating RN: Grover Canavan Primary Care Gevorg Brum: Salome Holmes Other Clinician: Referring Alisandra Son: Salome Holmes Treating Abbie Berling/Extender: Melburn Hake, HOYT Weeks in Treatment: 0 Clinic Level of Care Assessment Items TOOL 4 Quantity Score []  - Use when only an EandM is performed on FOLLOW-UP visit 0 ASSESSMENTS - Nursing Assessment / Reassessment X - Reassessment of Co-morbidities (includes updates in patient status) 1 10 X- 1 5 Reassessment of Adherence to Treatment Plan ASSESSMENTS - Wound and  Skin Assessment / Reassessment X - Simple Wound Assessment / Reassessment - one wound 1 5 []  - 0 Complex Wound Assessment / Reassessment - multiple wounds []  - 0 Dermatologic / Skin Assessment (not related to wound area) ASSESSMENTS - Focused Assessment []  - Circumferential Edema Measurements - multi extremities 0 []  - 0 Nutritional Assessment / Counseling / Intervention X- 1 5 Lower Extremity Assessment (monofilament, tuning fork, pulses) []  - 0 Peripheral Arterial Disease Assessment (using hand held doppler) ASSESSMENTS - Ostomy and/or Continence Assessment and Care []  - Incontinence Assessment and Management 0 []  - 0 Ostomy Care Assessment and Management (repouching, etc.) PROCESS - Coordination of Care []  - Simple Patient / Family Education for ongoing care 0 X- 1 20 Complex (extensive) Patient / Family Education for ongoing care []  - 0 Staff obtains Programmer, systems, Records, Test Results / Process Orders []  - 0 Staff telephones HHA, Nursing Homes / Clarify orders / etc []  - 0 Routine Transfer to another Facility (non-emergent condition) []  - 0 Routine Hospital Admission (non-emergent condition) []  - 0 New Admissions / Biomedical engineer / Ordering NPWT, Apligraf, etc. []  - 0 Emergency Hospital Admission (emergent condition) X- 1 10 Simple Discharge Coordination X- 1 15 Complex (extensive) Discharge Coordination PROCESS - Special Needs []  - Pediatric / Minor Patient Management 0 []  - 0 Isolation Patient Management []  - 0 Hearing / Language / Visual special needs []  - 0 Assessment of Community assistance (transportation, D/C planning, etc.) []  - 0 Additional assistance / Altered mentation []  - 0 Support Surface(s) Assessment (bed, cushion, seat, etc.) INTERVENTIONS - Wound Cleansing / Measurement Chisholm, Marciana M. ([id number]) X- 1 5 Simple Wound Cleansing - one wound []  - 0 Complex Wound Cleansing - multiple wounds X- 1 5 Wound Imaging (photographs - any  number of wounds) []  - 0 Wound Tracing (instead of photographs) X- 1 5 Simple Wound Measurement - one wound []  - 0 Complex  Wound Measurement - multiple wounds INTERVENTIONS - Wound Dressings []  - Small Wound Dressing one or multiple wounds 0 []  - 0 Medium Wound Dressing one or multiple wounds X- 1 20 Large Wound Dressing one or multiple wounds []  - 0 Application of Medications - topical []  - 0 Application of Medications - injection INTERVENTIONS - Miscellaneous []  - External ear exam 0 []  - 0 Specimen Collection (cultures, biopsies, blood, body fluids, etc.) []  - 0 Specimen(s) / Culture(s) sent or taken to Lab for analysis []  - 0 Patient Transfer (multiple staff / Civil Service fast streamer / Similar devices) []  - 0 Simple Staple / Suture removal (25 or less) []  - 0 Complex Staple / Suture removal (26 or more) []  - 0 Hypo / Hyperglycemic Management (close monitor of Blood Glucose) []  - 0 Ankle / Brachial Index (ABI) - do not check if billed separately X- 1 5 Vital Signs Has the patient been seen at the hospital within the last three years: Yes Total Score: 110 Level Of Care: New/Established - Level 3 Electronic Signature(s) Signed: 06/25/2020 4:32:44 PM By: Grover Canavan Entered By: Grover Canavan on 06/25/2020 15:15:50 Helvey, [name] ([id number]) -------------------------------------------------------------------------------- Encounter Discharge Information Details Patient Name: [name] Date of Service: 06/25/2020 2:00 PM Medical Record Number: [id number] Patient Account Number: [id number] Date of Birth/Sex: 06-Oct-1941 (79 y.o. F) Treating RN: Grover Canavan Primary Care Felipe Paluch: Salome Holmes Other Clinician: Referring Njeri Vicente: Salome Holmes Treating Kahlie Deutscher/Extender: Melburn Hake, HOYT Weeks in Treatment: 0 Encounter Discharge Information Items Discharge Condition: Stable Ambulatory Status: Wheelchair Discharge Destination: Emergency Room Telephoned:  No Orders Sent: Yes Transportation: Private Auto Accompanied By: son and daughter Schedule Follow-up Appointment: Yes Clinical Summary of Care: Electronic Signature(s) Signed: 06/25/2020 4:32:44 PM By: Grover Canavan Entered By: Grover Canavan on 06/25/2020 15:16:34 Ohlinger, [name] ([id number]) -------------------------------------------------------------------------------- Lower Extremity Assessment Details Patient Name: [name] Date of Service: 06/25/2020 2:00 PM Medical Record Number: [id number] Patient Account Number: [id number] Date of Birth/Sex: 14-Apr-1941 (79 y.o. F) Treating RN: Grover Canavan Primary Care Perina Salvaggio: Salome Holmes Other Clinician: Referring Malini Flemings: Salome Holmes Treating Story Vanvranken/Extender: Melburn Hake, HOYT Weeks in Treatment: 0 Edema Assessment Assessed: [Left: No] [Right: No] [Left: Edema] [Right: :] Calf Left: Right: Point of Measurement: 32 cm From Medial Instep 47 cm cm Ankle Left: Right: Point of Measurement: 12 cm From Medial Instep 36 cm cm Vascular Assessment Pulses: Dorsalis Pedis Palpable: [Left:Yes] Electronic Signature(s) Signed: 06/25/2020 4:32:44 PM By: Grover Canavan Signed: 06/25/2020 4:40:43 PM By: Darci Needle Entered By: Darci Needle on 06/25/2020 14:48:32 Finlayson, [name] ([id number]) -------------------------------------------------------------------------------- Multi-Disciplinary Care Plan Details Patient Name: [name] Date of Service: 06/25/2020 2:00 PM Medical Record Number: [id number] Patient Account Number: [id number] Date of Birth/Sex: [birth year] (79 y.o. F) Treating RN: Grover Canavan Primary Care Susen Haskew: Salome Holmes Other Clinician: Referring Libbi Towner: Salome Holmes Treating Halina Asano/Extender: Melburn Hake, HOYT Weeks in Treatment: 0 Active Inactive Wound/Skin Impairment Nursing Diagnoses: Impaired tissue integrity Knowledge deficit related to ulceration/compromised skin  integrity Goals: Patient/caregiver will verbalize understanding of skin care regimen Date Initiated: 06/22/2020 Target Resolution Date: 06/29/2020 Goal Status: Active Interventions: Assess patient/caregiver ability to obtain necessary supplies Assess patient/caregiver ability to perform ulcer/skin care regimen upon admission and as needed Assess ulceration(s) every visit Provide education on ulcer and skin care Treatment Activities: Skin care regimen initiated : 06/22/2020 Notes: Electronic Signature(s) Signed: 06/25/2020 4:32:44 PM By: Grover Canavan Entered By: Grover Canavan on 06/25/2020 15:08:13 Kulesza, [name] ([id number]) -------------------------------------------------------------------------------- Pain Assessment Details Patient Name: [name] Date  of Service: 06/25/2020 2:00 PM Medical Record Number: [id number] Patient Account Number: [id number] Date of Birth/Sex: 1941-02-05 (79 y.o. F) Treating RN: Grover Canavan Primary Care Deshonna Trnka: Salome Holmes Other Clinician: Referring Brunetta Newingham: Salome Holmes Treating Caya Soberanis/Extender: Melburn Hake, HOYT Weeks in Treatment: 0 Active Problems Location of Pain Severity and Description of Pain Patient Has Paino No Site Locations With Dressing Change: Yes Rate the pain. Current Pain Level: 9 Worst Pain Level: 9 Least Pain Level: 0 Tolerable Pain Level: 4 Character of Pain Describe the Pain: Aching, Sharp, Tender Pain Management and Medication Current Pain Management: Notes Takes Tylenol for pain Electronic Signature(s) Signed: 06/25/2020 4:32:44 PM By: Grover Canavan Signed: 06/25/2020 4:40:43 PM By: Darci Needle Entered By: Darci Needle on 06/25/2020 14:44:55 Fiallo, [name] ([id number]) -------------------------------------------------------------------------------- Patient/Caregiver Education Details Patient Name: [name] Date of Service: 06/25/2020 2:00 PM Medical Record Number:  [id number] Patient Account Number: [id number] Date of Birth/Gender: 1941-05-01 (79 y.o. F) Treating RN: Grover Canavan Primary Care Physician: Salome Holmes Other Clinician: Referring Physician: Salome Holmes Treating Physician/Extender: Sharalyn Ink in Treatment: 0 Education Assessment Education Provided To: Patient Education Topics Provided Wound/Skin Impairment: Handouts: Caring for Your Ulcer Methods: Explain/Verbal Responses: State content correctly Electronic Signature(s) Signed: 06/25/2020 4:32:44 PM By: Grover Canavan Entered By: Grover Canavan on 06/25/2020 15:09:08 Spychalski, [name] ([id number]) -------------------------------------------------------------------------------- Wound Assessment Details Patient Name: [name] Date of Service: 06/25/2020 2:00 PM Medical Record Number: [id number] Patient Account Number: [id number] Date of Birth/Sex: 1941-02-11 (79 y.o. F) Treating RN: Grover Canavan Primary Care Aiyana Stegmann: Salome Holmes Other Clinician: Referring Mayvis Agudelo: Salome Holmes Treating Khairi Garman/Extender: Melburn Hake, HOYT Weeks in Treatment: 0 Wound Status Wound Number: 6 Primary Lymphedema Etiology: Wound Location: Left, Lateral Lower Leg Wound Open Wounding Event: Gradually Appeared Status: Date Acquired: 05/25/2020 Comorbid Chronic sinus problems/congestion, Anemia, Weeks Of Treatment: 0 History: Lymphedema, Angina, Arrhythmia, Hypertension, Gout, Clustered Wound: No Rheumatoid Arthritis, Osteoarthritis Photos Wound Measurements Length: (cm) 12 Width: (cm) 17 Depth: (cm) 0.5 Area: (cm) 160.221 Volume: (cm) 80.111 % Reduction in Area: 0% % Reduction in Volume: -400% Epithelialization: None Wound Description Classification: Full Thickness Without Exposed Support Structu Wound Margin: Flat and Intact Exudate Amount: Large Exudate Type: Serous Exudate Color: amber res Foul Odor After Cleansing: No Slough/Fibrino Yes Wound  Bed Granulation Amount: None Present (0%) Exposed Structure Necrotic Amount: Large (67-100%) Fascia Exposed: No Necrotic Quality: Adherent Slough Fat Layer (Subcutaneous Tissue) Exposed: Yes Tendon Exposed: No Muscle Exposed: No Joint Exposed: No Bone Exposed: No Assessment Notes Heavy drainage, very foul odor today even after cleaning wound. Electronic Signature(s) Signed: 06/25/2020 4:32:44 PM By: Grover Canavan Signed: 06/25/2020 4:40:43 PM By: Darci Needle Entered By: Darci Needle on 06/25/2020 14:47:47 Brigandi, [name] ([id number]) Cruickshank, MERLINA MARCHENA ([id number]) -------------------------------------------------------------------------------- Vitals Details Patient Name: [name] Date of Service: 06/25/2020 2:00 PM Medical Record Number: [id number] Patient Account Number: [id number] Date of Birth/Sex: [birth year] (79 y.o. F) Treating RN: Grover Canavan Primary Care Olly Shiner: Salome Holmes Other Clinician: Referring Samera Macy: Salome Holmes Treating Severina Sykora/Extender: Melburn Hake, HOYT Weeks in Treatment: 0 Vital Signs Time Taken: 02:30 Temperature (F): 97.9 Height (in): 63 Pulse (bpm): 71 Weight (lbs): 142 Respiratory Rate (breaths/min): 18 Body Mass Index (BMI): 25.2 Blood Pressure (mmHg): 98/52 Reference Range: 80 - 120 mg / dl Electronic Signature(s) Signed: 06/25/2020 4:32:44 PM By: Grover Canavan Entered By: Grover Canavan on 06/25/2020 15:07:54

## 2020-06-25 NOTE — Progress Notes (Addendum)
LESLIE, JESTER ([id number]) Visit Report for 06/25/2020 Chief Complaint Document Details Patient Name: [name], [name] Date of Service: 06/25/2020 2:00 PM Medical Record Number: [id number] Patient Account Number: [id number] Date of Birth/Sex: Feb 23, 1941 (79 y.o. F) Treating RN: Grover Canavan Primary Care Provider: Salome Holmes Other Clinician: Referring Provider: Salome Holmes Treating Provider/Extender: Melburn Hake, Durinda Buzzelli Weeks in Treatment: 0 Information Obtained from: Patient Chief Complaint Left LE Ulcer Electronic Signature(s) Signed: 06/25/2020 2:52:46 PM By: Worthy Keeler PA-C Entered By: Worthy Keeler on 06/25/2020 14:52:45 Witz, [name] ([id number]) -------------------------------------------------------------------------------- HPI Details Patient Name: [name] Date of Service: 06/25/2020 2:00 PM Medical Record Number: [id number] Patient Account Number: [id number] Date of Birth/Sex: 28-Jan-1941 (79 y.o. F) Treating RN: Grover Canavan Primary Care Provider: Salome Holmes Other Clinician: Referring Provider: Salome Holmes Treating Provider/Extender: Melburn Hake, Karia Ehresman Weeks in Treatment: 0 History of Present Illness HPI Description: 03/05/18 on evaluation today patient presents for initial evaluation and our clinic due to significantly large blisters of the bilateral lower extremities which began initially on 02/18/18. 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 Secondarily it does appear that they felt she likely had cellulitis. Subsequently the patient was seen for fault evaluation on 03/03/18 and that is really when the cellulitis was diagnosed. She was actually placed on doxycycline 100 mg by mouth twice a day for 10 days. With that being said she was prescribed prednisone although she did not tolerate this. Apparently she had some alteration in her mental status with  taking this. 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 She has never smoked and unfortunately does have pain at the site where these blisters are located of the bilateral lower extremities. She also does appear to have significant lymphedema which obviously has been more chronic. 03/12/18 on evaluation today patient actually appears to be doing much better in regard to her bilateral [employer] ulcers. Unfortunately a lot of the alginate dressing did stick to her legs due to the fact that everything dried up rather rapidly in the compression seems to have done very well in that regard. Unfortunately the home health nurse last time she came out to the patient's home was not able to fully remove the alginate from her leg so she just rewrapped it with what was there continuing to be in place. Obviously that does need to come off but overall I'm extremely pleased with the progress it's been made over the past week which is the Kerlex and Coban wraps. 03/18/18-She is here in follow-up evaluation for bilateral lower extremity lymphedema with blister/ulceration. She is essentially healed to the right lower extremity with one residual pinpoint opening, the left lower extremity has multiple pinpoint openings with minimal amount of serous drainage. We will continue with compression over this next week. We have requested that home help initiate the process of ordering compression wraps for long-term management. She will follow-up next week, I anticipate she will be healed at that appointment 03/26/18 on evaluation today patient appears to be doing extremely well in regard to her bilateral lower extremity ulcers. This has completely resolved for the most part on the left lower extremity and has completely resolved in regard to the right lower extremity. She's been tolerating the dressing changes without complication including the wraps. Overall her  swelling is down significantly and I think this accounts for  why she is doing so much better at this time. Overall she is very pleased and having very little discomfort compared to previous. 04/02/18 on evaluation today patient actually appears to be completely healed which is excellent news. She has been tolerating the wraps without complication obviously this has done very well for her. In general she shows no signs of infection or otherwise complicating factors. She's having no pain. Readmission: 06/02/2019 on evaluation today patient appears to be unfortunately experiencing issues with increased swelling of her bilateral lower extremities. I have last seen her in June 2019. At that time everything healed and it does appear that we actually got her some Velcro compression at that time. Nonetheless she states she was wearing that until about 5 weeks ago and then subsequently she quit wearing it she tells me the only excuse that she can give is "laziness". Nonetheless she is not able to get any compression sleeves/stockings on. she tells me currently that she is not experiencing any evidence of infection at this time she is been having some leaking intermittently from her lower extremities right now the left on the lateral portion is the only area open. No fevers, chills, nausea, vomiting, or diarrhea. 06/09/2019 on evaluation today patient actually appears to be doing quite well with regard to her leg ulcers in fact everything appears to be completely closed at this time bilaterally. There is no signs of active infection at this time which is good news. Overall I am very pleased. She did bring her Velcro compression wraps with her today. READMISSION 01/25/2020 This is a now 79 year old woman who is been in this clinic for a prolonged period of time in 2019 and more recently in 2020 with bilateral lower extremity blisters and wounds in the setting of chronic lymphedema. She is generally fairly  noncompliant with compression. When she was here in 2020 she was discharged with bilateral zippered stockings but she tells as these stopped working and she has not been wearing anything for a long period of time. She came in today after developing a substantial superficial blister on the right anterior and lateral lower leg which she said started on Sunday. She has not had any pain. As noted she is not wearing compression stockings. Past medical history includes chronic myelocytic leukemia presumably in remission although I did not really see anything about this on [place] link, coronary artery disease, hypertension, lupus on hydroxychloroquine, stage III chronic renal failure with an estimated GFR of 39 based on lab work in February of this year. We did not do her ABIs today although most recently in 2020 these were 0.84 and 0.82 right and left respectively 4/21; substantial wound on the right lateral lower leg which was a humongous blister last week. She also has an area on the right lateral calf. We put her in compression with silver alginate. 4/28; substantial wound on the right lateral lower leg is epithelializing nicely. The area on the right lateral posterior calf has fully epithelialized today. We have been using silver alginate under compression. 5/5; substantial blistered area on the right lateral lower leg has epithelialized nicely there is only a small open area here. The area on the left lateral calf posteriorly is reopened. Also some drainage here. The patient has bilateral zippered extremitease stockings 5/12; the patient did not bring her stockings this week. The substantial blistered area on the right lateral lower leg has completely epithelialized. AREIL, OTTEY ([id number]) The area on the left lateral calf which reopened  last week is improved but there is still a tiny open area that is draining. Surrounded by very dry skin and eschar 5/19; the patient has bilateral Farrow  wrap stockings. Everything is epithelialized. This patient has severe lymphedema with chronic venous insufficiency. She came into the clinic not wearing her compression stockings with very large superficial blisters. Fortunately these heal fairly easily with compression wraps and standard dressings. Readmission: 06/22/2020 on evaluation today patient presents for readmission here in the clinic concerning issues she has been having with her left leg over the past week. Fortunately there is no signs of active infection at this time which is good news systemically With that being said I do feel like there is some local evidence of infection here but does have me concerned. She has a lot of edema in the leg as well. She is really in need of a fairly significant compression in my opinion. Her leg does appear to be erythematous and that has me concerned. Fortunately there is no signs of sepsis in particular which is great news. The patient is having discomfort however. She previously tolerated a 4-layer compression wrap without complication. 06/25/2020 on evaluation today patient appears to be doing well with regard to his wounds all things considered. He still has a significant amount of drainage at this time 06/25/2020 on evaluation today patient appears to be doing poorly in regard to her leg ulcer. When I saw her on Friday this actually had a hole that had opened up in the central portion of her leg ulceration which was quite deep and actually surprised me just with using saline and gauze to wipe away the necrotic tissue. Nonetheless I had obtained a deep culture from this area once this was removed. That culture came back showing Morganella morganii as well as Serratia Marcescens. With that being said there is no single oral antibiotic that could cover this therefore I was discontinuing the doxycycline has not had to switch away from the Bactrim which was the only oral option that would cover both due to  an allergy and had changed her to Augmentin and cefdinir. With that being said once I went in to see her today it became obvious that she is going require a greater intervention than what I initially suspected based on what I am seeing at this point. I really feel like she is getting need IV antibiotics ASAP in the emergency department as can be the only way to accomplish this. I discussed this with the patient as well as with her son today who is also discussing that with her sister. 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 Electronic Signature(s) Signed: 06/25/2020 3:16:57 PM By: Worthy Keeler PA-C Entered By: Worthy Keeler on 06/25/2020 15:16:56 Mulvaney, [name] ([id number]) -------------------------------------------------------------------------------- Physical Exam Details Patient Name: [name] Date of Service: 06/25/2020 2:00 PM Medical Record Number: [id number] Patient Account Number: [id number] Date of Birth/Sex: 25-Sep-1941 (79 y.o. F) Treating RN: Grover Canavan Primary Care Provider: Salome Holmes Other Clinician: Referring Provider: Salome Holmes Treating Provider/Extender: Melburn Hake, Sharone Picchi Weeks in Treatment: 0 Constitutional Well-nourished and well-hydrated in no acute distress. Respiratory normal breathing without difficulty. Psychiatric this patient is able to make decisions and demonstrates good insight into disease process. Alert and Oriented x 3. pleasant and cooperative. Notes On inspection patient's leg ulceration actually still showed signs of significant erythema and  unfortunately she has a lot of skin breakdown as well that is consistent with a worsening infection. I am very concerned that overall this seems to be headed in the incorrect direction. She has previously tolerated compression wraps but again I do not think this is anything to do with the wrap she  does have much less edema noted today but nonetheless the actual wound site is significantly worse just from Friday until today. That is only a matter of roughly 3 full days at most. Either way I think that the patient needs to be seen emergently as far as IV antibiotics are concerned she can then potentially transition into the cefdinir as well as the Augmentin that I prescribed her for use at home but again at this point I think that that is not an appropriate way to go based on the severity of what I see. Electronic Signature(s) Signed: 06/25/2020 3:18:04 PM By: Worthy Keeler PA-C Entered By: Worthy Keeler on 06/25/2020 15:18:04 Senna, [name] ([id number]) -------------------------------------------------------------------------------- Physician Orders Details Patient Name: [name] Date of Service: 06/25/2020 2:00 PM Medical Record Number: [id number] Patient Account Number: [id number] Date of Birth/Sex: January 04, 1941 (79 y.o. F) Treating RN: Grover Canavan Primary Care Provider: Salome Holmes Other Clinician: Referring Provider: Salome Holmes Treating Provider/Extender: Melburn Hake, Oliwia Berzins Weeks in Treatment: 0 Verbal / Phone Orders: No Diagnosis Coding ICD-10 Coding Code Description I89.0 Lymphedema, not elsewhere classified L97.822 Non-pressure chronic ulcer of other part of left lower leg with fat layer exposed I10 Essential (primary) hypertension D50.8 Other iron deficiency anemias Wound Cleansing Wound #6 Left,Lateral Lower Leg o Dial antibacterial soap, wash wounds, rinse and pat dry prior to dressing wounds Anesthetic (add to Medication List) Wound #6 Left,Lateral Lower Leg o Topical Lidocaine 4% cream applied to wound bed prior to debridement (In Clinic Only). Primary Wound Dressing Wound #6 Left,Lateral Lower Leg o Silver Alginate - pack into pocket and put over wound as well Secondary Dressing Wound #6 Left,Lateral Lower Leg o XtraSorb Dressing Change  Frequency Wound #6 Left,Lateral Lower Leg o Change dressing every week Follow-up Appointments Wound #6 Left,Lateral Lower Leg o Return Appointment in 1 week. Edema Control Wound #6 Left,Lateral Lower Leg o 4-Layer Compression System - Left Lower Extremity. o Elevate legs to the level of the heart and pump ankles as often as possible Patient Medications Allergies: Sulfa (Sulfonamide Antibiotics) Notifications Medication Indication Start End cefdinir 06/25/2020 DOSE 1 - oral 300 mg capsule - 1 capsule oral taken 2 times per day for 14 days. Do not take a multivitamin with this medication Augmentin 06/25/2020 DOSE 1 - oral 875 mg-125 mg tablet - 1 tablet oral taken 2 times per day for 14 days Scheel, Dorcas M. ([id number]) Notes Pt going to the [facility] ER in [place]. Legs dressed with abd pads, roll gauze and tubi grip Electronic Signature(s) Signed: 06/25/2020 4:32:44 PM By: Grover Canavan Signed: 06/25/2020 4:45:03 PM By: Worthy Keeler PA-C Previous Signature: 06/25/2020 3:04:52 PM Version By: Worthy Keeler PA-C Entered By: Grover Canavan on 06/25/2020 15:14:42 Fritsche, [name] ([id number]) -------------------------------------------------------------------------------- Problem List Details Patient Name: [name] Date of Service: 06/25/2020 2:00 PM Medical Record Number: [id number] Patient Account Number: [id number] Date of Birth/Sex: Oct 16, 1940 (79 y.o. F) Treating RN: Grover Canavan Primary Care Provider: Salome Holmes Other Clinician: Referring Provider: Salome Holmes Treating Provider/Extender: Melburn Hake, Javier Mamone Weeks in Treatment: 0 Active Problems ICD-10 Encounter Code Description Active Date MDM Diagnosis I89.0 Lymphedema, not elsewhere classified 06/22/2020 No Yes I29.798  Non-pressure chronic ulcer of other part of left lower leg with fat layer 06/22/2020 No Yes exposed [place] (primary) hypertension 06/22/2020 No Yes D50.8 Other iron deficiency  anemias 06/22/2020 No Yes Inactive Problems Resolved Problems Electronic Signature(s) Signed: 06/25/2020 2:52:34 PM By: Worthy Keeler PA-C Entered By: Worthy Keeler on 06/25/2020 14:52:33 Godinho, [name] ([id number]) -------------------------------------------------------------------------------- Progress Note Details Patient Name: [name] Date of Service: 06/25/2020 2:00 PM Medical Record Number: [id number] Patient Account Number: [id number] Date of Birth/Sex: Sep 14, 1941 (79 y.o. F) Treating RN: Grover Canavan Primary Care Provider: Salome Holmes Other Clinician: Referring Provider: Salome Holmes Treating Provider/Extender: Melburn Hake, Dreyden Rohrman Weeks in Treatment: 0 Subjective Chief Complaint Information obtained from Patient Left LE Ulcer History of Present Illness (HPI) 03/05/18 on evaluation today patient presents for initial evaluation and our clinic due to significantly large blisters of the bilateral lower extremities which began initially on 02/18/18. 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 Secondarily it does appear that they felt she likely had cellulitis. Subsequently the patient was seen for fault evaluation on 03/03/18 and that is really when the cellulitis was diagnosed. She was actually placed on doxycycline 100 mg by mouth twice a day for 10 days. With that being said she was prescribed prednisone although she did not tolerate this. Apparently she had some alteration in her mental status with taking this. 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 She has never smoked and unfortunately does have pain at the site where these blisters are located of the bilateral lower extremities. She also does appear to have significant lymphedema which obviously has been more chronic. 03/12/18 on evaluation today patient  actually appears to be doing much better in regard to her bilateral [employer] ulcers. Unfortunately a lot of the alginate dressing did stick to her legs due to the fact that everything dried up rather rapidly in the compression seems to have done very well in that regard. Unfortunately the home health nurse last time she came out to the patient's home was not able to fully remove the alginate from her leg so she just rewrapped it with what was there continuing to be in place. Obviously that does need to come off but overall I'm extremely pleased with the progress it's been made over the past week which is the Kerlex and Coban wraps. 03/18/18-She is here in follow-up evaluation for bilateral lower extremity lymphedema with blister/ulceration. She is essentially healed to the right lower extremity with one residual pinpoint opening, the left lower extremity has multiple pinpoint openings with minimal amount of serous drainage. We will continue with compression over this next week. We have requested that home help initiate the process of ordering compression wraps for long-term management. She will follow-up next week, I anticipate she will be healed at that appointment 03/26/18 on evaluation today patient appears to be doing extremely well in regard to her bilateral lower extremity ulcers. This has completely resolved for the most part on the left lower extremity and has completely resolved in regard to the right lower extremity. She's been tolerating the dressing changes without complication including the wraps. Overall her swelling is down significantly and I think this accounts for why she is doing so much better at this time. Overall she is very pleased and having very little discomfort compared to previous. 04/02/18 on  evaluation today patient actually appears to be completely healed which is excellent news. She has been tolerating the wraps without complication obviously this has done very well for  her. In general she shows no signs of infection or otherwise complicating factors. She's having no pain. Readmission: 06/02/2019 on evaluation today patient appears to be unfortunately experiencing issues with increased swelling of her bilateral lower extremities. I have last seen her in June 2019. At that time everything healed and it does appear that we actually got her some Velcro compression at that time. Nonetheless she states she was wearing that until about 5 weeks ago and then subsequently she quit wearing it she tells me the only excuse that she can give is "laziness". Nonetheless she is not able to get any compression sleeves/stockings on. she tells me currently that she is not experiencing any evidence of infection at this time she is been having some leaking intermittently from her lower extremities right now the left on the lateral portion is the only area open. No fevers, chills, nausea, vomiting, or diarrhea. 06/09/2019 on evaluation today patient actually appears to be doing quite well with regard to her leg ulcers in fact everything appears to be completely closed at this time bilaterally. There is no signs of active infection at this time which is good news. Overall I am very pleased. She did bring her Velcro compression wraps with her today. READMISSION 01/25/2020 This is a now 79 year old woman who is been in this clinic for a prolonged period of time in 2019 and more recently in 2020 with bilateral lower extremity blisters and wounds in the setting of chronic lymphedema. She is generally fairly noncompliant with compression. When she was here in 2020 she was discharged with bilateral zippered stockings but she tells as these stopped working and she has not been wearing anything for a long period of time. She came in today after developing a substantial superficial blister on the right anterior and lateral lower leg which she said started on Sunday. She has not had any pain. As  noted she is not wearing compression stockings. Past medical history includes chronic myelocytic leukemia presumably in remission although I did not really see anything about this on [place] link, coronary artery disease, hypertension, lupus on hydroxychloroquine, stage III chronic renal failure with an estimated GFR of 39 based on lab work in February of this year. We did not do her ABIs today although most recently in 2020 these were 0.84 and 0.82 right and left respectively 4/21; substantial wound on the right lateral lower leg which was a humongous blister last week. She also has an area on the right lateral calf. We put her in compression with silver alginate. 4/28; substantial wound on the right lateral lower leg is epithelializing nicely. The area on the right lateral posterior calf has fully epithelialized today. We have been using silver alginate under compression. RONNELL, MAKAREWICZ ([id number]) 5/5; substantial blistered area on the right lateral lower leg has epithelialized nicely there is only a small open area here. The area on the left lateral calf posteriorly is reopened. Also some drainage here. The patient has bilateral zippered extremitease stockings 5/12; the patient did not bring her stockings this week. The substantial blistered area on the right lateral lower leg has completely epithelialized. The area on the left lateral calf which reopened last week is improved but there is still a tiny open area that is draining. Surrounded by very dry skin and eschar 5/19; the  patient has bilateral [place] wrap stockings. Everything is epithelialized. This patient has severe lymphedema with chronic venous insufficiency. She came into the clinic not wearing her compression stockings with very large superficial blisters. Fortunately these heal fairly easily with compression wraps and standard dressings. Readmission: 06/22/2020 on evaluation today patient presents for readmission here in the  clinic concerning issues she has been having with her left leg over the past week. Fortunately there is no signs of active infection at this time which is good news systemically With that being said I do feel like there is some local evidence of infection here but does have me concerned. She has a lot of edema in the leg as well. She is really in need of a fairly significant compression in my opinion. Her leg does appear to be erythematous and that has me concerned. Fortunately there is no signs of sepsis in particular which is great news. The patient is having discomfort however. She previously tolerated a 4-layer compression wrap without complication. 06/25/2020 on evaluation today patient appears to be doing well with regard to his wounds all things considered. He still has a significant amount of drainage at this time 06/25/2020 on evaluation today patient appears to be doing poorly in regard to her leg ulcer. When I saw her on Friday this actually had a hole that had opened up in the central portion of her leg ulceration which was quite deep and actually surprised me just with using saline and gauze to wipe away the necrotic tissue. Nonetheless I had obtained a deep culture from this area once this was removed. That culture came back showing Morganella morganii as well as Serratia Marcescens. With that being said there is no single oral antibiotic that could cover this therefore I was discontinuing the doxycycline has not had to switch away from the Bactrim which was the only oral option that would cover both due to an allergy and had changed her to Augmentin and cefdinir. With that being said once I went in to see her today it became obvious that she is going require a greater intervention than what I initially suspected based on what I am seeing at this point. I really feel like she is getting need IV antibiotics ASAP in the emergency department as can be the only way to accomplish this. I  discussed this with the patient as well as with her son today who is also discussing that with her sister. 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 Objective Constitutional Well-nourished and well-hydrated in no acute distress. Vitals Time Taken: 2:30 AM, Height: 63 in, Weight: 142 lbs, BMI: 25.2, Temperature: 97.9 F, Pulse: 71 bpm, Respiratory Rate: 18 breaths/min, Blood Pressure: 98/52 mmHg. Respiratory normal breathing without difficulty. Psychiatric this patient is able to make decisions and demonstrates good insight into disease process. Alert and Oriented x 3. pleasant and cooperative. General Notes: On inspection patient's leg ulceration actually still showed signs of significant erythema and unfortunately she has a lot of skin breakdown as well that is consistent with a worsening infection. I am very concerned that overall this seems to be headed in the incorrect direction. She has previously tolerated compression wraps but again I do not think this is anything to do with the wrap she does have much less edema noted today but nonetheless the actual wound site is significantly worse just from Friday  until today. That is only a matter of roughly 3 full days at most. Either way I think that the patient needs to be seen emergently as far as IV antibiotics are concerned she can then potentially transition into the cefdinir as well as the Augmentin that I prescribed her for use at home but again at this point I think that that is not an appropriate way to go based on the severity of what I see. Integumentary (Hair, Skin) Wound #6 status is Open. Original cause of wound was Gradually Appeared. The wound is located on the Left,Lateral Lower Leg. The wound measures 12cm length x 17cm width x 0.5cm depth; 160.221cm^2 area and 80.111cm^3 volume. There is Fat Layer (Subcutaneous Tissue) exposed. There  is a large amount of serous drainage noted. The wound margin is flat and intact. There is no granulation within the wound bed. There is a large (67-100%) amount of necrotic tissue within the wound bed including Adherent Slough. General Notes: Heavy drainage, very foul odor today even after cleaning wound. Assessment Fasig, SHEETAL LYALL ([id number]) Active Problems ICD-10 Lymphedema, not elsewhere classified Non-pressure chronic ulcer of other part of left lower leg with fat layer exposed Essential (primary) hypertension Other iron deficiency anemias Plan Wound Cleansing: Wound #6 Left,Lateral Lower Leg: Dial antibacterial soap, wash wounds, rinse and pat dry prior to dressing wounds Anesthetic (add to Medication List): Wound #6 Left,Lateral Lower Leg: Topical Lidocaine 4% cream applied to wound bed prior to debridement (In Clinic Only). Primary Wound Dressing: Wound #6 Left,Lateral Lower Leg: Silver Alginate - pack into pocket and put over wound as well Secondary Dressing: Wound #6 Left,Lateral Lower Leg: XtraSorb Dressing Change Frequency: Wound #6 Left,Lateral Lower Leg: Change dressing every week Follow-up Appointments: Wound #6 Left,Lateral Lower Leg: Return Appointment in 1 week. Edema Control: Wound #6 Left,Lateral Lower Leg: 4-Layer Compression System - Left Lower Extremity. Elevate legs to the level of the heart and pump ankles as often as possible The following medication(s) was prescribed: cefdinir oral 300 mg capsule 1 1 capsule oral taken 2 times per day for 14 days. Do not take a multivitamin with this medication starting 06/25/2020 Augmentin oral 875 mg-125 mg tablet 1 1 tablet oral taken 2 times per day for 14 days starting 06/25/2020 General Notes: Pt going to the [facility] ER in [place]. Legs dressed with abd pads, roll gauze and tubi grip 1. I would recommend at this point that we go ahead and have the patient go to the ER ASAP for further evaluation and treatment I  specifically believe that she is going require IV antibiotics I will send a copy of the culture with her as well today and I am also can send a copy of this note just to update the ER on what we are doing at this point. She is likely can require admission. 2. I am also can recommend that for the time being we put on a temporary dressing with ABD pads and roll gauze along with Tubigrip just to get her to the ER so that this will catch any drainage of which she has quite a bit of excessive drainage at this point. 3. With regard to her mental status she does not appear to be mentally compromised at this point I am not necessarily concerned of sepsis at this time although I think that this is likely can to be a great concern going forward if we do not get this under control. We will see the patient back for follow-up  visit after the ER evaluation. Electronic Signature(s) Signed: 06/25/2020 3:19:10 PM By: Worthy Keeler PA-C Entered By: Worthy Keeler on 06/25/2020 15:19:09 Hefel, [name] ([id number]) -------------------------------------------------------------------------------- SuperBill Details Patient Name: [name] Date of Service: 06/25/2020 Medical Record Number: [id number] Patient Account Number: [id number] Date of Birth/Sex: [birth year] (79 y.o. F) Treating RN: Grover Canavan Primary Care Provider: Salome Holmes Other Clinician: Referring Provider: Salome Holmes Treating Provider/Extender: Melburn Hake, Soriyah Osberg Weeks in Treatment: 0 Diagnosis Coding ICD-10 Codes Code Description I89.0 Lymphedema, not elsewhere classified L97.822 Non-pressure chronic ulcer of other part of left lower leg with fat layer exposed I10 Essential (primary) hypertension D50.8 Other iron deficiency anemias Facility Procedures CPT4 Code: 25053976 Description: 99213 - WOUND CARE VISIT-LEV 3 EST PT Modifier: Quantity: 1 Physician Procedures CPT4 Code: 7341937 Description: 90240 - WC PHYS LEVEL 4 - EST  PT Modifier: Quantity: 1 CPT4 Code: Description: ICD-10 Diagnosis Description I89.0 Lymphedema, not elsewhere classified L97.822 Non-pressure chronic ulcer of other part of left lower leg with fat lay I10 Essential (primary) hypertension D50.8 Other iron deficiency anemias Modifier: er exposed Quantity: Electronic Signature(s) Signed: 06/25/2020 3:19:24 PM By: Worthy Keeler PA-C Entered By: Worthy Keeler on 06/25/2020 15:19:23

## 2020-06-26 NOTE — Progress Notes (Signed)
FINNLEIGH, MARCHETTI ([id number]) Visit Report for 06/22/2020 Abuse/Suicide Risk Screen Details Patient Name: [name], [name] Date of Service: 06/22/2020 9:15 AM Medical Record Number: [id number] Patient Account Number: [id number] Date of Birth/Sex: [birth year] (79 y.o. F) Treating RN: Cornell Barman Primary Care Sommer Spickard: Salome Holmes Other Clinician: Referring Lashawnda Hancox: Salome Holmes Treating Orian Figueira/Extender: Melburn Hake, HOYT Weeks in Treatment: 0 Abuse/Suicide Risk Screen Items Answer ABUSE RISK SCREEN: Has anyone close to you tried to hurt or harm you recentlyo No Do you feel uncomfortable with anyone in your familyo No Has anyone forced you do things that you didnot want to doo No Electronic Signature(s) Signed: 06/22/2020 10:47:19 AM By: Darci Needle Signed: 06/26/2020 4:28:55 PM By: Gretta Cool, BSN, RN, CWS, Kim RN, BSN Entered By: Darci Needle on 06/22/2020 10:02:08 [name] ([id number]) -------------------------------------------------------------------------------- Activities of Daily Living Details Patient Name: [name] Date of Service: 06/22/2020 9:15 AM Medical Record Number: [id number] Patient Account Number: [id number] Date of Birth/Sex: 29-Oct-1940 (79 y.o. F) Treating RN: Cornell Barman Primary Care Thanh Mottern: Salome Holmes Other Clinician: Referring Adyan Palau: Salome Holmes Treating Teran Knittle/Extender: Melburn Hake, HOYT Weeks in Treatment: 0 Activities of Daily Living Items Answer Activities of Daily Living (Please select one for each item) Drive Automobile Not Able Take Medications Completely Able Use Telephone Completely Able Care for Appearance Completely Able Use Toilet Completely Able Bath / Shower Completely Able Dress Self Completely Able Feed Self Completely Able Walk Need Assistance Get In / Out Bed Completely Able Housework Completely Able Prepare Meals Completely [place] for Self Not Able Electronic  Signature(s) Signed: 06/22/2020 10:47:19 AM By: Darci Needle Signed: 06/26/2020 4:28:55 PM By: Gretta Cool, BSN, RN, CWS, Kim RN, BSN Entered By: Darci Needle on 06/22/2020 10:02:31 Teat, [name] ([id number]) -------------------------------------------------------------------------------- Education Screening Details Patient Name: [name] Date of Service: 06/22/2020 9:15 AM Medical Record Number: [id number] Patient Account Number: [id number] Date of Birth/Sex: [birth year] (79 y.o. F) Treating RN: Cornell Barman Primary Care Rhylee Nunn: Salome Holmes Other Clinician: Referring Hong Moring: Salome Holmes Treating Danniela Mcbrearty/Extender: Melburn Hake, HOYT Weeks in Treatment: 0 Primary Learner Assessed: Patient Learning Preferences/Education Level/Primary Language Learning Preference: Explanation, Printed Material Highest Education Level: High School Preferred Language: English Cognitive Barrier Language Barrier: No Translator Needed: No Memory Deficit: No Emotional Barrier: No Physical Barrier Impaired Vision: No Impaired Hearing: No Decreased Hand dexterity: No Knowledge/Comprehension Knowledge Level: Medium Comprehension Level: Medium Ability to understand written instructions: Medium Ability to understand verbal instructions: Medium Motivation Anxiety Level: Calm Cooperation: Cooperative Education Importance: Acknowledges Need Interest in Health Problems: Asks Questions Perception: Coherent Willingness to Engage in Self-Management High Activities: Readiness to Engage in Self-Management High Activities: Electronic Signature(s) Signed: 06/22/2020 10:47:19 AM By: Darci Needle Signed: 06/26/2020 4:28:55 PM By: Gretta Cool, BSN, RN, CWS, Kim RN, BSN Entered By: Darci Needle on 06/22/2020 10:03:06 Weigel, [name] ([id number]) -------------------------------------------------------------------------------- Fall Risk Assessment Details Patient Name: [name] Date of  Service: 06/22/2020 9:15 AM Medical Record Number: [id number] Patient Account Number: [id number] Date of Birth/Sex: January 26, 1941 (79 y.o. F) Treating RN: Cornell Barman Primary Care Rue Valladares: Salome Holmes Other Clinician: Referring Ernestine Langworthy: Salome Holmes Treating Renad Jenniges/Extender: Melburn Hake, HOYT Weeks in Treatment: 0 Fall Risk Assessment Items Have you had 2 or more falls in the last 12 monthso 0 No Have you had any fall that resulted in injury in the last 12 monthso 0 No FALLS RISK SCREEN History of falling - immediate or within 3 months 25 Yes Secondary diagnosis (Do you have 2 or more medical  diagnoseso) 0 No Ambulatory aid None/bed rest/wheelchair/nurse 0 Yes Crutches/cane/walker 0 No Furniture 0 No Intravenous therapy Access/Saline/Heparin Lock 0 No Gait/Transferring Normal/ bed rest/ wheelchair 0 Yes Weak (short steps with or without shuffle, stooped but able to lift head while walking, may 0 No seek support from furniture) Impaired (short steps with shuffle, may have difficulty arising from chair, head down, impaired 0 No balance) Mental Status Oriented to own ability 0 Yes Electronic Signature(s) Signed: 06/22/2020 10:47:19 AM By: Darci Needle Signed: 06/26/2020 4:28:55 PM By: Gretta Cool, BSN, RN, CWS, Kim RN, BSN Entered By: Darci Needle on 06/22/2020 10:03:29 Lazare, [name] ([id number]) -------------------------------------------------------------------------------- Foot Assessment Details Patient Name: [name] Date of Service: 06/22/2020 9:15 AM Medical Record Number: [id number] Patient Account Number: [id number] Date of Birth/Sex: October 06, 1941 (79 y.o. F) Treating RN: Cornell Barman Primary Care Avianah Pellman: Salome Holmes Other Clinician: Referring Brenae Lasecki: Salome Holmes Treating Jacquelene Kopecky/Extender: Melburn Hake, HOYT Weeks in Treatment: 0 Foot Assessment Items Site Locations + = Sensation present, - = Sensation absent, C = Callus, U = Ulcer R = Redness, W =  Warmth, M = Maceration, PU = Pre-ulcerative lesion F = Fissure, S = Swelling, D = Dryness Assessment Right: Left: Other Deformity: No No Prior Foot Ulcer: No No Prior Amputation: No No Charcot Joint: No No Ambulatory Status: Non-ambulatory Assistance Device: Wheelchair Gait: Electronic Signature(s) Signed: 06/22/2020 10:47:19 AM By: Darci Needle Signed: 06/26/2020 4:28:55 PM By: Gretta Cool, BSN, RN, CWS, Kim RN, BSN Entered By: Darci Needle on 06/22/2020 10:03:58 Bonham, [name] ([id number]) -------------------------------------------------------------------------------- Nutrition Risk Screening Details Patient Name: [name] Date of Service: 06/22/2020 9:15 AM Medical Record Number: [id number] Patient Account Number: [id number] Date of Birth/Sex: 1941/04/11 (79 y.o. F) Treating RN: Cornell Barman Primary Care Yancey Pedley: Salome Holmes Other Clinician: Referring Kalee Broxton: Salome Holmes Treating Josephene Marrone/Extender: Melburn Hake, HOYT Weeks in Treatment: 0 Height (in): 63 Weight (lbs): 142 Body Mass Index (BMI): 25.2 Nutrition Risk Screening Items Score Screening NUTRITION RISK SCREEN: I have an illness or condition that made me change the kind and/or amount of food I eat 0 No I eat fewer than two meals per day 0 No I eat few fruits and vegetables, or milk products 0 No I have three or more drinks of beer, liquor or wine almost every day 0 No I have tooth or mouth problems that make it hard for me to eat 0 No I don't always have enough money to buy the food I need 0 No I eat alone most of the time 0 No I take three or more different prescribed or over-the-counter drugs a day 1 Yes Without wanting to, I have lost or gained 10 pounds in the last six months 0 No I am not always physically able to shop, cook and/or feed myself 0 No Nutrition Protocols Good Risk Protocol 0 No interventions needed Moderate Risk Protocol High Risk Proctocol Risk Level: Good Risk Score:  1 Electronic Signature(s) Signed: 06/22/2020 10:47:19 AM By: Darci Needle Signed: 06/26/2020 4:28:55 PM By: Gretta Cool, BSN, RN, CWS, Kim RN, BSN Entered By: Darci Needle on 06/22/2020 10:03:45

## 2020-06-26 NOTE — Progress Notes (Signed)
[name], [name] ([id number]) Visit Report for 06/22/2020 Allergy List Details Patient Name: [name], [name] Date of Service: 06/22/2020 9:15 AM Medical Record Number: [id number] Patient Account Number: [id number] Date of Birth/Sex: May 05, 1941 (79 y.o. F) Treating RN: Cornell Barman Primary Care Koula Venier: Salome Holmes Other Clinician: Referring Marshea Wisher: Salome Holmes Treating Mylo Driskill/Extender: Melburn Hake, HOYT Weeks in Treatment: 0 Allergies Active Allergies Sulfa (Sulfonamide Antibiotics) Allergy Notes Patient states no allergies, but pharmacy called stating patient is allergic to sulfa meds. Electronic Signature(s) Signed: 06/22/2020 12:31:44 PM By: Gretta Cool, BSN, RN, CWS, Kim RN, BSN Previous Signature: 06/22/2020 10:47:19 AM Version By: Darci Needle Entered By: Gretta Cool BSN, RN, CWS, Kim on 06/22/2020 12:31:43 Rossmann, [name] ([id number]) -------------------------------------------------------------------------------- Arrival Information Details Patient Name: [name] Date of Service: 06/22/2020 9:15 AM Medical Record Number: [id number] Patient Account Number: [id number] Date of Birth/Sex: October 13, 1941 (79 y.o. F) Treating RN: Cornell Barman Primary Care Kesean Serviss: Salome Holmes Other Clinician: Referring Laketia Vicknair: Salome Holmes Treating Breleigh Carpino/Extender: Melburn Hake, HOYT Weeks in Treatment: 0 Visit Information Patient Arrived: Wheel Chair Arrival Time: 09:44 Accompanied By: Son in lobby Transfer Assistance: Manual Patient Identification Verified: Yes Secondary Verification Process Completed: Yes Patient Requires Transmission-Based Precautions: No Patient Has Alerts: No History Since Last Visit Electronic Signature(s) Signed: 06/22/2020 10:47:19 AM By: Darci Needle Entered By: Darci Needle on 06/22/2020 09:46:16 Polich, [name] ([id number]) -------------------------------------------------------------------------------- Clinic Level of Care Assessment Details Patient  Name: [name] Date of Service: 06/22/2020 9:15 AM Medical Record Number: [id number] Patient Account Number: [id number] Date of Birth/Sex: 1940-12-25 (79 y.o. F) Treating RN: Grover Canavan Primary Care Valora Norell: Salome Holmes Other Clinician: Referring Ortencia Askari: Salome Holmes Treating Malorie Bigford/Extender: Melburn Hake, HOYT Weeks in Treatment: 0 Clinic Level of Care Assessment Items TOOL 4 Quantity Score []  - Use when only an EandM is performed on FOLLOW-UP visit 0 ASSESSMENTS - Nursing Assessment / Reassessment X - Reassessment of Co-morbidities (includes updates in patient status) 1 10 X- 1 5 Reassessment of Adherence to Treatment Plan ASSESSMENTS - Wound and Skin Assessment / Reassessment X - Simple Wound Assessment / Reassessment - one wound 1 5 []  - 0 Complex Wound Assessment / Reassessment - multiple wounds []  - 0 Dermatologic / Skin Assessment (not related to wound area) ASSESSMENTS - Focused Assessment []  - Circumferential Edema Measurements - multi extremities 0 []  - 0 Nutritional Assessment / Counseling / Intervention X- 1 5 Lower Extremity Assessment (monofilament, tuning fork, pulses) []  - 0 Peripheral Arterial Disease Assessment (using hand held doppler) ASSESSMENTS - Ostomy and/or Continence Assessment and Care []  - Incontinence Assessment and Management 0 []  - 0 Ostomy Care Assessment and Management (repouching, etc.) PROCESS - Coordination of Care []  - Simple Patient / Family Education for ongoing care 0 X- 1 20 Complex (extensive) Patient / Family Education for ongoing care X- 1 10 Staff obtains Programmer, systems, Records, Test Results / Process Orders []  - 0 Staff telephones HHA, Nursing Homes / Clarify orders / etc []  - 0 Routine Transfer to another Facility (non-emergent condition) []  - 0 Routine Hospital Admission (non-emergent condition) X- 1 15 New Admissions / Biomedical engineer / Ordering NPWT, Apligraf, etc. []  - 0 Emergency Hospital  Admission (emergent condition) X- 1 10 Simple Discharge Coordination []  - 0 Complex (extensive) Discharge Coordination PROCESS - Special Needs []  - Pediatric / Minor Patient Management 0 []  - 0 Isolation Patient Management []  - 0 Hearing / Language / Visual special needs []  - 0 Assessment of Community assistance (transportation, D/C planning, etc.) []  - 0  Additional assistance / Altered mentation []  - 0 Support Surface(s) Assessment (bed, cushion, seat, etc.) INTERVENTIONS - Wound Cleansing / Measurement Holford, Blayre M. ([id number]) X- 1 5 Simple Wound Cleansing - one wound []  - 0 Complex Wound Cleansing - multiple wounds X- 1 5 Wound Imaging (photographs - any number of wounds) []  - 0 Wound Tracing (instead of photographs) X- 1 5 Simple Wound Measurement - one wound []  - 0 Complex Wound Measurement - multiple wounds INTERVENTIONS - Wound Dressings []  - Small Wound Dressing one or multiple wounds 0 []  - 0 Medium Wound Dressing one or multiple wounds X- 1 20 Large Wound Dressing one or multiple wounds []  - 0 Application of Medications - topical []  - 0 Application of Medications - injection INTERVENTIONS - Miscellaneous []  - External ear exam 0 []  - 0 Specimen Collection (cultures, biopsies, blood, body fluids, etc.) []  - 0 Specimen(s) / Culture(s) sent or taken to Lab for analysis []  - 0 Patient Transfer (multiple staff / Civil Service fast streamer / Similar devices) []  - 0 Simple Staple / Suture removal (25 or less) []  - 0 Complex Staple / Suture removal (26 or more) []  - 0 Hypo / Hyperglycemic Management (close monitor of Blood Glucose) []  - 0 Ankle / Brachial Index (ABI) - do not check if billed separately X- 1 5 Vital Signs Has the patient been seen at the hospital within the last three years: Yes Total Score: 120 Level Of Care: New/Established - Level 4 Electronic Signature(s) Signed: 06/22/2020 3:44:42 PM By: Grover Canavan Entered By: Grover Canavan on  06/22/2020 10:25:18 Ullery, [name] ([id number]) -------------------------------------------------------------------------------- Compression Therapy Details Patient Name: [name] Date of Service: 06/22/2020 9:15 AM Medical Record Number: [id number] Patient Account Number: [id number] Date of Birth/Sex: 08-Nov-1940 (79 y.o. F) Treating RN: Grover Canavan Primary Care Jhane Lorio: Salome Holmes Other Clinician: Referring Renato Spellman: Salome Holmes Treating Romualdo Prosise/Extender: Melburn Hake, HOYT Weeks in Treatment: 0 Compression Therapy Performed for Wound Assessment: Wound #6 Left,Lateral Lower Leg Performed By: Clinician Grover Canavan, RN Compression Type: Four Layer Post Procedure Diagnosis Same as Pre-procedure Electronic Signature(s) Signed: 06/22/2020 3:44:42 PM By: Grover Canavan Entered By: Grover Canavan on 06/22/2020 10:24:24 [name] ([id number]) -------------------------------------------------------------------------------- Encounter Discharge Information Details Patient Name: [name] Date of Service: 06/22/2020 9:15 AM Medical Record Number: [id number] Patient Account Number: [id number] Date of Birth/Sex: [birth year] (79 y.o. F) Treating RN: Grover Canavan Primary Care Cailah Reach: Salome Holmes Other Clinician: Referring Bethaney Oshana: Salome Holmes Treating Trevyn Lumpkin/Extender: Melburn Hake, HOYT Weeks in Treatment: 0 Encounter Discharge Information Items Discharge Condition: Stable Ambulatory Status: Wheelchair Discharge Destination: Home Transportation: Private Auto Accompanied By: son Schedule Follow-up Appointment: Yes Clinical Summary of Care: Electronic Signature(s) Signed: 06/22/2020 3:44:42 PM By: Grover Canavan Entered By: Grover Canavan on 06/22/2020 10:26:43 Somero, [name] ([id number]) -------------------------------------------------------------------------------- Lower Extremity Assessment Details Patient Name: [name] Date of  Service: 06/22/2020 9:15 AM Medical Record Number: [id number] Patient Account Number: [id number] Date of Birth/Sex: Feb 26, 1941 (79 y.o. F) Treating RN: Cornell Barman Primary Care Mallery Harshman: Salome Holmes Other Clinician: Referring Majour Frei: Salome Holmes Treating Carlyn Mullenbach/Extender: Melburn Hake, HOYT Weeks in Treatment: 0 Edema Assessment Assessed: [Left: No] [Right: No] [Left: Edema] [Right: :] Calf Left: Right: Point of Measurement: 32 cm From Medial Instep 47 cm 35.5 cm Ankle Left: Right: Point of Measurement: 12 cm From Medial Instep 35.5 cm 30.8 cm Vascular Assessment Pulses: Dorsalis Pedis Palpable: [Left:No] [Right:No] Doppler Audible: [Left:Yes] [Right:Yes] Posterior Tibial Palpable: [Left:No Inaudible] [Right:No Inaudible] Notes Both feet are warm. Cannot  get PT pulses due to swelling and body shape in the ankle. Electronic Signature(s) Signed: 06/22/2020 10:47:19 AM By: Darci Needle Signed: 06/26/2020 4:28:55 PM By: Gretta Cool, BSN, RN, CWS, Kim RN, BSN Entered By: Darci Needle on 06/22/2020 10:01:07 Stepter, [name] ([id number]) -------------------------------------------------------------------------------- Multi Wound Chart Details Patient Name: [name] Date of Service: 06/22/2020 9:15 AM Medical Record Number: [id number] Patient Account Number: [id number] Date of Birth/Sex: 12/13/1940 (79 y.o. F) Treating RN: Grover Canavan Primary Care Mickel Schreur: Salome Holmes Other Clinician: Referring Kaizley Aja: Salome Holmes Treating Clarice Bonaventure/Extender: Melburn Hake, HOYT Weeks in Treatment: 0 Vital Signs Height(in): 63 Pulse(bpm): 84 Weight(lbs): 142 Blood Pressure(mmHg): 141/76 Body Mass Index(BMI): 25 Temperature(F): 98 Respiratory Rate(breaths/min): 18 Photos: [N/A:N/A] Wound Location: Left, Lateral Lower Leg N/A N/A Wounding Event: Gradually Appeared N/A N/A Primary Etiology: Lymphedema N/A N/A Date Acquired: 05/25/2020 N/A N/A Weeks of Treatment: 0 N/A  N/A Wound Status: Open N/A N/A Measurements L x W x D (cm) 12x17x0.1 N/A N/A Area (cm) : 846.659 N/A N/A Volume (cm) : 16.022 N/A N/A % Reduction in Area: 0.00% N/A N/A % Reduction in Volume: 0.00% N/A N/A Classification: Full Thickness Without Exposed N/A N/A Support Structures Exudate Amount: Large N/A N/A Exudate Type: Serous N/A N/A Exudate Color: amber N/A N/A Wound Margin: Flat and Intact N/A N/A Granulation Amount: None Present (0%) N/A N/A Necrotic Amount: Large (67-100%) N/A N/A Exposed Structures: Fat Layer (Subcutaneous Tissue): N/A N/A Yes Fascia: No Tendon: No Muscle: No Joint: No Bone: No Epithelialization: None N/A N/A Treatment Notes Electronic Signature(s) Signed: 06/22/2020 3:44:42 PM By: Grover Canavan Entered By: Grover Canavan on 06/22/2020 10:18:23 Kloth, [name] ([id number]) -------------------------------------------------------------------------------- Multi-Disciplinary Care Plan Details Patient Name: [name] Date of Service: 06/22/2020 9:15 AM Medical Record Number: [id number] Patient Account Number: [id number] Date of Birth/Sex: 06/12/1941 (79 y.o. F) Treating RN: Grover Canavan Primary Care Shelbie Franken: Salome Holmes Other Clinician: Referring Janelie Goltz: Salome Holmes Treating Javier Gell/Extender: Melburn Hake, HOYT Weeks in Treatment: 0 Active Inactive Wound/Skin Impairment Nursing Diagnoses: Impaired tissue integrity Knowledge deficit related to ulceration/compromised skin integrity Goals: Patient/caregiver will verbalize understanding of skin care regimen Date Initiated: 06/22/2020 Target Resolution Date: 06/29/2020 Goal Status: Active Interventions: Assess patient/caregiver ability to obtain necessary supplies Assess patient/caregiver ability to perform ulcer/skin care regimen upon admission and as needed Assess ulceration(s) every visit Provide education on ulcer and skin care Treatment Activities: Skin care regimen  initiated : 06/22/2020 Notes: Electronic Signature(s) Signed: 06/22/2020 3:44:42 PM By: Grover Canavan Entered By: Grover Canavan on 06/22/2020 10:14:40 Saner, [name] ([id number]) -------------------------------------------------------------------------------- Pain Assessment Details Patient Name: [name] Date of Service: 06/22/2020 9:15 AM Medical Record Number: [id number] Patient Account Number: [id number] Date of Birth/Sex: December 09, 1940 (79 y.o. F) Treating RN: Cornell Barman Primary Care Joanmarie Tsang: Salome Holmes Other Clinician: Referring Lindie Roberson: Salome Holmes Treating Feliberto Stockley/Extender: Melburn Hake, HOYT Weeks in Treatment: 0 Active Problems Location of Pain Severity and Description of Pain Patient Has Paino No Site Locations Pain Management and Medication Current Pain Management: Notes Patient denies pain unless she hits it on something. Electronic Signature(s) Signed: 06/22/2020 10:47:19 AM By: Darci Needle Signed: 06/26/2020 4:28:55 PM By: Gretta Cool, BSN, RN, CWS, Kim RN, BSN Entered By: Darci Needle on 06/22/2020 09:46:45 Krog, [name] ([id number]) -------------------------------------------------------------------------------- Patient/Caregiver Education Details Patient Name: [name] Date of Service: 06/22/2020 9:15 AM Medical Record Number: [id number] Patient Account Number: [id number] Date of Birth/Gender: November 17, 1940 (79 y.o. F) Treating RN: Grover Canavan Primary Care Physician: Salome Holmes Other Clinician: Referring Physician: Salome Holmes Treating Physician/Extender:  STONE III, HOYT Weeks in Treatment: 0 Education Assessment Education Provided To: Patient Education Topics Provided Wound/Skin Impairment: Handouts: Caring for Your Ulcer Methods: Explain/Verbal Responses: State content correctly Electronic Signature(s) Signed: 06/22/2020 3:44:42 PM By: Grover Canavan Entered By: Grover Canavan on 06/22/2020 10:15:11 Woessner, [name]  ([id number]) -------------------------------------------------------------------------------- Wound Assessment Details Patient Name: [name] Date of Service: 06/22/2020 9:15 AM Medical Record Number: [id number] Patient Account Number: [id number] Date of Birth/Sex: 06/26/1941 (79 y.o. F) Treating RN: Cornell Barman Primary Care Ozell Ferrera: Salome Holmes Other Clinician: Referring Oliana Gowens: Salome Holmes Treating Yoali Conry/Extender: Melburn Hake, HOYT Weeks in Treatment: 0 Wound Status Wound Number: 6 Primary Etiology: Lymphedema Wound Location: Left, Lateral Lower Leg Wound Status: Open Wounding Event: Gradually Appeared Date Acquired: 05/25/2020 Weeks Of Treatment: 0 Clustered Wound: No Photos Wound Measurements Length: (cm) 12 Width: (cm) 17 Depth: (cm) 0.1 Area: (cm) 160.221 Volume: (cm) 16.022 % Reduction in Area: 0% % Reduction in Volume: 0% Epithelialization: None Tunneling: No Undermining: No Wound Description Classification: Full Thickness Without Exposed Support Structu Wound Margin: Flat and Intact Exudate Amount: Large Exudate Type: Serous Exudate Color: amber res Foul Odor After Cleansing: No Slough/Fibrino Yes Wound Bed Granulation Amount: None Present (0%) Exposed Structure Necrotic Amount: Large (67-100%) Fascia Exposed: No Necrotic Quality: Adherent Slough Fat Layer (Subcutaneous Tissue) Exposed: Yes Tendon Exposed: No Muscle Exposed: No Joint Exposed: No Bone Exposed: No Electronic Signature(s) Signed: 06/22/2020 10:47:19 AM By: Darci Needle Signed: 06/26/2020 4:28:55 PM By: Gretta Cool, BSN, RN, CWS, Kim RN, BSN Entered By: Darci Needle on 06/22/2020 09:59:06 Teagarden, [name] ([id number]) -------------------------------------------------------------------------------- [facility] Details Patient Name: [name] Date of Service: 06/22/2020 9:15 AM Medical Record Number: [id number] Patient Account Number: [id number] Date of Birth/Sex: August 18, 1941  (79 y.o. F) Treating RN: Cornell Barman Primary Care Daishaun Ayre: Salome Holmes Other Clinician: Referring Maleiya Pergola: Salome Holmes Treating Ayo Smoak/Extender: Melburn Hake, HOYT Weeks in Treatment: 0 Vital Signs Time Taken: 09:46 Temperature (F): 98 Height (in): 63 Pulse (bpm): 84 Weight (lbs): 142 Respiratory Rate (breaths/min): 18 Body Mass Index (BMI): 25.2 Blood Pressure (mmHg): 141/76 Reference Range: 80 - 120 mg / dl Electronic Signature(s) Signed: 06/22/2020 10:47:19 AM By: Darci Needle Entered By: Darci Needle on 06/22/2020 09:47:19

## 2020-06-26 NOTE — Progress Notes (Signed)
KAITLYND, PHILLIPS ([id number]) Visit Report for 06/22/2020 Chief Complaint Document Details Patient Name: RILYNNE, LONSWAY Date of Service: 06/22/2020 9:15 AM Medical Record Number: [id number] Patient Account Number: [id number] Date of Birth/Sex: March 30, 1941 (79 y.o. F) Treating RN: Cornell Barman Primary Care Provider: Salome Holmes Other Clinician: Referring Provider: Salome Holmes Treating Provider/Extender: Melburn Hake, Lariya Kinzie Weeks in Treatment: 0 Information Obtained from: Patient Chief Complaint Left LE Ulcer Electronic Signature(s) Signed: 06/22/2020 10:09:41 AM By: Worthy Keeler PA-C Entered By: Worthy Keeler on 06/22/2020 10:09:40 Meigs, [name] ([id number]) -------------------------------------------------------------------------------- HPI Details Patient Name: [name] Date of Service: 06/22/2020 9:15 AM Medical Record Number: [id number] Patient Account Number: [id number] Date of Birth/Sex: November 09, 1940 (79 y.o. F) Treating RN: Cornell Barman Primary Care Provider: Salome Holmes Other Clinician: Referring Provider: Salome Holmes Treating Provider/Extender: Melburn Hake, Amorina Doerr Weeks in Treatment: 0 History of Present Illness HPI Description: 03/05/18 on evaluation today patient presents for initial evaluation and our clinic due to significantly large blisters of the bilateral lower extremities which began initially on 02/18/18. 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 Secondarily it does appear that they felt she likely had cellulitis. Subsequently the patient was seen for fault evaluation on 03/03/18 and that is really when the cellulitis was diagnosed. She was actually placed on doxycycline 100 mg by mouth twice a day for 10 days. With that being said she was prescribed prednisone although she did not tolerate this. Apparently she had some alteration in her mental status with taking  this. 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 She has never smoked and unfortunately does have pain at the site where these blisters are located of the bilateral lower extremities. She also does appear to have significant lymphedema which obviously has been more chronic. 03/12/18 on evaluation today patient actually appears to be doing much better in regard to her bilateral [employer] ulcers. Unfortunately a lot of the alginate dressing did stick to her legs due to the fact that everything dried up rather rapidly in the compression seems to have done very well in that regard. Unfortunately the home health nurse last time she came out to the patient's home was not able to fully remove the alginate from her leg so she just rewrapped it with what was there continuing to be in place. Obviously that does need to come off but overall I'm extremely pleased with the progress it's been made over the past week which is the Kerlex and Coban wraps. 03/18/18-She is here in follow-up evaluation for bilateral lower extremity lymphedema with blister/ulceration. She is essentially healed to the right lower extremity with one residual pinpoint opening, the left lower extremity has multiple pinpoint openings with minimal amount of serous drainage. We will continue with compression over this next week. We have requested that home help initiate the process of ordering compression wraps for long-term management. She will follow-up next week, I anticipate she will be healed at that appointment 03/26/18 on evaluation today patient appears to be doing extremely well in regard to her bilateral lower extremity ulcers. This has completely resolved for the most part on the left lower extremity and has completely resolved in regard to the right lower extremity. She's been tolerating the dressing changes without complication including the wraps. Overall her swelling is  down significantly and I think this accounts for  why she is doing so much better at this time. Overall she is very pleased and having very little discomfort compared to previous. 04/02/18 on evaluation today patient actually appears to be completely healed which is excellent news. She has been tolerating the wraps without complication obviously this has done very well for her. In general she shows no signs of infection or otherwise complicating factors. She's having no pain. Readmission: 06/02/2019 on evaluation today patient appears to be unfortunately experiencing issues with increased swelling of her bilateral lower extremities. I have last seen her in June 2019. At that time everything healed and it does appear that we actually got her some Velcro compression at that time. Nonetheless she states she was wearing that until about 5 weeks ago and then subsequently she quit wearing it she tells me the only excuse that she can give is "laziness". Nonetheless she is not able to get any compression sleeves/stockings on. she tells me currently that she is not experiencing any evidence of infection at this time she is been having some leaking intermittently from her lower extremities right now the left on the lateral portion is the only area open. No fevers, chills, nausea, vomiting, or diarrhea. 06/09/2019 on evaluation today patient actually appears to be doing quite well with regard to her leg ulcers in fact everything appears to be completely closed at this time bilaterally. There is no signs of active infection at this time which is good news. Overall I am very pleased. She did bring her Velcro compression wraps with her today. READMISSION 01/25/2020 This is a now 79 year old woman who is been in this clinic for a prolonged period of time in 2019 and more recently in 2020 with bilateral lower extremity blisters and wounds in the setting of chronic lymphedema. She is generally fairly noncompliant with  compression. When she was here in 2020 she was discharged with bilateral zippered stockings but she tells as these stopped working and she has not been wearing anything for a long period of time. She came in today after developing a substantial superficial blister on the right anterior and lateral lower leg which she said started on Sunday. She has not had any pain. As noted she is not wearing compression stockings. Past medical history includes chronic myelocytic leukemia presumably in remission although I did not really see anything about this on [place] link, coronary artery disease, hypertension, lupus on hydroxychloroquine, stage III chronic renal failure with an estimated GFR of 39 based on lab work in February of this year. We did not do her ABIs today although most recently in 2020 these were 0.84 and 0.82 right and left respectively 4/21; substantial wound on the right lateral lower leg which was a humongous blister last week. She also has an area on the right lateral calf. We put her in compression with silver alginate. 4/28; substantial wound on the right lateral lower leg is epithelializing nicely. The area on the right lateral posterior calf has fully epithelialized today. We have been using silver alginate under compression. 5/5; substantial blistered area on the right lateral lower leg has epithelialized nicely there is only a small open area here. The area on the left lateral calf posteriorly is reopened. Also some drainage here. The patient has bilateral zippered extremitease stockings 5/12; the patient did not bring her stockings this week. The substantial blistered area on the right lateral lower leg has completely epithelialized. TIEGAN, JAMBOR ([id number]) The area on the left lateral calf which reopened  last week is improved but there is still a tiny open area that is draining. Surrounded by very dry skin and eschar 5/19; the patient has bilateral Farrow wrap stockings.  Everything is epithelialized. This patient has severe lymphedema with chronic venous insufficiency. She came into the clinic not wearing her compression stockings with very large superficial blisters. Fortunately these heal fairly easily with compression wraps and standard dressings. Readmission: 06/22/2020 on evaluation today patient presents for readmission here in the clinic concerning issues she has been having with her left leg over the past week. Fortunately there is no signs of active infection at this time which is good news systemically With that being said I do feel like there is some local evidence of infection here but does have me concerned. She has a lot of edema in the leg as well. She is really in need of a fairly significant compression in my opinion. Her leg does appear to be erythematous and that has me concerned. Fortunately there is no signs of sepsis in particular which is great news. The patient is having discomfort however. She previously tolerated a 4-layer compression wrap without complication. Electronic Signature(s) Signed: 06/22/2020 2:42:35 PM By: Worthy Keeler PA-C Entered By: Worthy Keeler on 06/22/2020 14:42:34 Langhans, [name] ([id number]) -------------------------------------------------------------------------------- Physical Exam Details Patient Name: [name] Date of Service: 06/22/2020 9:15 AM Medical Record Number: [id number] Patient Account Number: [id number] Date of Birth/Sex: 09/29/1941 (79 y.o. F) Treating RN: Cornell Barman Primary Care Provider: Salome Holmes Other Clinician: Referring Provider: Salome Holmes Treating Provider/Extender: Melburn Hake, Fuller Makin Weeks in Treatment: 0 Constitutional sitting or standing blood pressure is within target range for patient.. pulse regular and within target range for patient.Marland Kitchen respirations regular, non- labored and within target range for patient.Marland Kitchen temperature within target range for patient.. Well-nourished  and well-hydrated in no acute distress. Eyes conjunctiva clear no eyelid edema noted. pupils equal round and reactive to light and accommodation. Ears, Nose, Mouth, and Throat no gross abnormality of ear auricles or external auditory canals. normal hearing noted during conversation. mucus membranes moist. Respiratory normal breathing without difficulty. Cardiovascular 1+ dorsalis pedis/posterior tibialis pulses. 2+ pitting edema of the bilateral lower extremities. Musculoskeletal Patient unable to walk without assistance. no significant deformity or arthritic changes, no loss or range of motion, no clubbing. Psychiatric this patient is able to make decisions and demonstrates good insight into disease process. Alert and Oriented x 3. pleasant and cooperative. Notes Upon inspection patient's wound bed actually shows signs of significant slough buildup there is also a tremendous amount of swelling noted at this point which is of concern as well. She is able to feel all over the area and in fact again is having discomfort at most spots whenever I touch or gently clean off this with saline gauze I did perform a saline gauze debridement today but no sharp debridement at this point due to the pain that she was experiencing. Electronic Signature(s) Signed: 06/22/2020 2:43:20 PM By: Worthy Keeler PA-C Entered By: Worthy Keeler on 06/22/2020 14:43:20 Pecha, [name] ([id number]) -------------------------------------------------------------------------------- Physician Orders Details Patient Name: [name] Date of Service: 06/22/2020 9:15 AM Medical Record Number: [id number] Patient Account Number: [id number] Date of Birth/Sex: 06/09/1941 (79 y.o. F) Treating RN: Grover Canavan Primary Care Provider: Salome Holmes Other Clinician: Referring Provider: Salome Holmes Treating Provider/Extender: Melburn Hake, Raneem Mendolia Weeks in Treatment: 0 Verbal / Phone Orders: No Diagnosis Coding ICD-10  Coding Code Description I89.0 Lymphedema, not elsewhere classified L97.822 Non-pressure  chronic ulcer of other part of left lower leg with fat layer exposed I10 Essential (primary) hypertension D50.8 Other iron deficiency anemias Wound Cleansing Wound #6 Left,Lateral Lower Leg o Dial antibacterial soap, wash wounds, rinse and pat dry prior to dressing wounds Anesthetic (add to Medication List) Wound #6 Left,Lateral Lower Leg o Topical Lidocaine 4% cream applied to wound bed prior to debridement (In Clinic Only). Primary Wound Dressing Wound #6 Left,Lateral Lower Leg o Silver Alginate - pack into pocket and put over wound as well Secondary Dressing Wound #6 Left,Lateral Lower Leg o XtraSorb Dressing Change Frequency Wound #6 Left,Lateral Lower Leg o Change dressing every week Follow-up Appointments Wound #6 Left,Lateral Lower Leg o Return Appointment in 1 week. Edema Control Wound #6 Left,Lateral Lower Leg o 4-Layer Compression System - Left Lower Extremity. o Elevate legs to the level of the heart and pump ankles as often as possible Laboratory o Bacteria identified in Wound by Culture (MICRO) oooo LOINC Code: 2297-9 oooo Convenience Name: Wound culture routine Patient Medications Allergies: Sulfa (Sulfonamide Antibiotics) Notifications Medication Indication Start End Bactrim DS 06/22/2020 Gengler, [name] ([id number]) Notifications Medication Indication Start End DOSE 1 - oral 800 mg-160 mg tablet - 1 tablet oral taken 2 times per day for 14 days doxycycline hyclate 06/22/2020 DOSE 1 - oral 100 mg capsule - 1 capsule oral taken 2 times per day for 14 days Notes start Bactrim Electronic Signature(s) Signed: 06/22/2020 11:00:56 AM By: Worthy Keeler PA-C Previous Signature: 06/22/2020 10:34:10 AM Version By: Worthy Keeler PA-C Entered By: Worthy Keeler on 06/22/2020 11:00:55 Daughtridge, [name]  ([id number]) -------------------------------------------------------------------------------- Problem List Details Patient Name: [name] Date of Service: 06/22/2020 9:15 AM Medical Record Number: [id number] Patient Account Number: [id number] Date of Birth/Sex: Mar 28, 1941 (79 y.o. F) Treating RN: Cornell Barman Primary Care Provider: Salome Holmes Other Clinician: Referring Provider: Salome Holmes Treating Provider/Extender: Melburn Hake, Kathleene Bergemann Weeks in Treatment: 0 Active Problems ICD-10 Encounter Code Description Active Date MDM Diagnosis I89.0 Lymphedema, not elsewhere classified 06/22/2020 No Yes L97.822 Non-pressure chronic ulcer of other part of left lower leg with fat layer 06/22/2020 No Yes exposed [place] (primary) hypertension 06/22/2020 No Yes D50.8 Other iron deficiency anemias 06/22/2020 No Yes Inactive Problems Resolved Problems Electronic Signature(s) Signed: 06/22/2020 10:10:03 AM By: Worthy Keeler PA-C Entered By: Worthy Keeler on 06/22/2020 10:10:02 Cella, [name] ([id number]) -------------------------------------------------------------------------------- Progress Note Details Patient Name: [name] Date of Service: 06/22/2020 9:15 AM Medical Record Number: [id number] Patient Account Number: [id number] Date of Birth/Sex: [birth year] (79 y.o. F) Treating RN: Cornell Barman Primary Care Provider: Salome Holmes Other Clinician: Referring Provider: Salome Holmes Treating Provider/Extender: Melburn Hake, Ailine Hefferan Weeks in Treatment: 0 Subjective Chief Complaint Information obtained from Patient Left LE Ulcer History of Present Illness (HPI) 03/05/18 on evaluation today patient presents for initial evaluation and our clinic due to significantly large blisters of the bilateral lower extremities which began initially on 02/18/18. 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 Secondarily it does appear that they felt she likely had cellulitis. Subsequently the patient was seen for fault evaluation on 03/03/18 and that is really when the cellulitis was diagnosed. She was actually placed on doxycycline 100 mg by mouth twice a day for 10 days. With that being said she was prescribed prednisone although she did not tolerate this. Apparently she had some alteration in her mental status with  taking this. 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 She has never smoked and unfortunately does have pain at the site where these blisters are located of the bilateral lower extremities. She also does appear to have significant lymphedema which obviously has been more chronic. 03/12/18 on evaluation today patient actually appears to be doing much better in regard to her bilateral [employer] ulcers. Unfortunately a lot of the alginate dressing did stick to her legs due to the fact that everything dried up rather rapidly in the compression seems to have done very well in that regard. Unfortunately the home health nurse last time she came out to the patient's home was not able to fully remove the alginate from her leg so she just rewrapped it with what was there continuing to be in place. Obviously that does need to come off but overall I'm extremely pleased with the progress it's been made over the past week which is the Kerlex and Coban wraps. 03/18/18-She is here in follow-up evaluation for bilateral lower extremity lymphedema with blister/ulceration. She is essentially healed to the right lower extremity with one residual pinpoint opening, the left lower extremity has multiple pinpoint openings with minimal amount of serous drainage. We will continue with compression over this next week. We have requested that home help initiate the process of ordering compression wraps for long-term management. She will follow-up next  week, I anticipate she will be healed at that appointment 03/26/18 on evaluation today patient appears to be doing extremely well in regard to her bilateral lower extremity ulcers. This has completely resolved for the most part on the left lower extremity and has completely resolved in regard to the right lower extremity. She's been tolerating the dressing changes without complication including the wraps. Overall her swelling is down significantly and I think this accounts for why she is doing so much better at this time. Overall she is very pleased and having very little discomfort compared to previous. 04/02/18 on evaluation today patient actually appears to be completely healed which is excellent news. She has been tolerating the wraps without complication obviously this has done very well for her. In general she shows no signs of infection or otherwise complicating factors. She's having no pain. Readmission: 06/02/2019 on evaluation today patient appears to be unfortunately experiencing issues with increased swelling of her bilateral lower extremities. I have last seen her in June 2019. At that time everything healed and it does appear that we actually got her some Velcro compression at that time. Nonetheless she states she was wearing that until about 5 weeks ago and then subsequently she quit wearing it she tells me the only excuse that she can give is "laziness". Nonetheless she is not able to get any compression sleeves/stockings on. she tells me currently that she is not experiencing any evidence of infection at this time she is been having some leaking intermittently from her lower extremities right now the left on the lateral portion is the only area open. No fevers, chills, nausea, vomiting, or diarrhea. 06/09/2019 on evaluation today patient actually appears to be doing quite well with regard to her leg ulcers in fact everything appears to be completely closed at this time bilaterally. There  is no signs of active infection at this time which is good news. Overall I am very pleased. She did bring her Velcro compression wraps with her today. READMISSION 01/25/2020 This is a now 79 year old woman  who is been in this clinic for a prolonged period of time in 2019 and more recently in 2020 with bilateral lower extremity blisters and wounds in the setting of chronic lymphedema. She is generally fairly noncompliant with compression. When she was here in 2020 she was discharged with bilateral zippered stockings but she tells as these stopped working and she has not been wearing anything for a long period of time. She came in today after developing a substantial superficial blister on the right anterior and lateral lower leg which she said started on Sunday. She has not had any pain. As noted she is not wearing compression stockings. Past medical history includes chronic myelocytic leukemia presumably in remission although I did not really see anything about this on [facility] link, coronary artery disease, hypertension, lupus on hydroxychloroquine, stage III chronic renal failure with an estimated GFR of 39 based on lab work in February of this year. We did not do her ABIs today although most recently in 2020 these were 0.84 and 0.82 right and left respectively 4/21; substantial wound on the right lateral lower leg which was a humongous blister last week. She also has an area on the right lateral calf. We put her in compression with silver alginate. 4/28; substantial wound on the right lateral lower leg is epithelializing nicely. The area on the right lateral posterior calf has fully epithelialized today. We have been using silver alginate under compression. BLYSS, LUGAR ([id number]) 5/5; substantial blistered area on the right lateral lower leg has epithelialized nicely there is only a small open area here. The area on the left lateral calf posteriorly is reopened. Also some drainage  here. The patient has bilateral zippered extremitease stockings 5/12; the patient did not bring her stockings this week. The substantial blistered area on the right lateral lower leg has completely epithelialized. The area on the left lateral calf which reopened last week is improved but there is still a tiny open area that is draining. Surrounded by very dry skin and eschar 5/19; the patient has bilateral Farrow wrap stockings. Everything is epithelialized. This patient has severe lymphedema with chronic venous insufficiency. She came into the clinic not wearing her compression stockings with very large superficial blisters. Fortunately these heal fairly easily with compression wraps and standard dressings. Readmission: 06/22/2020 on evaluation today patient presents for readmission here in the clinic concerning issues she has been having with her left leg over the past week. Fortunately there is no signs of active infection at this time which is good news systemically With that being said I do feel like there is some local evidence of infection here but does have me concerned. She has a lot of edema in the leg as well. She is really in need of a fairly significant compression in my opinion. Her leg does appear to be erythematous and that has me concerned. Fortunately there is no signs of sepsis in particular which is great news. The patient is having discomfort however. She previously tolerated a 4-layer compression wrap without complication. Patient History Information obtained from Patient. Allergies Sulfa (Sulfonamide Antibiotics) General Notes: Patient states no allergies, but pharmacy called stating patient is allergic to sulfa meds. Family History Cancer - Mother, Diabetes - Siblings, Hypertension - Siblings, Lung Disease - Siblings, No family history of Heart Disease, Hereditary Spherocytosis, Kidney Disease, Seizures, Stroke, Thyroid Problems, Tuberculosis. Social History Never  smoker, Marital Status - Widowed, Alcohol Use - Never, Drug Use - No History, Caffeine Use - Never.  Medical History Eyes Denies history of Cataracts, Glaucoma, Optic Neuritis Ear/Nose/Mouth/Throat Patient has history of Chronic sinus problems/congestion - allergys seasonal Denies history of Middle ear problems Hematologic/Lymphatic Patient has history of Anemia, Lymphedema Denies history of Hemophilia, Human Immunodeficiency Virus, Sickle Cell Disease Respiratory Denies history of Aspiration, Asthma, Chronic Obstructive Pulmonary Disease (COPD), Pneumothorax, Sleep Apnea, Tuberculosis Cardiovascular Patient has history of Angina, Arrhythmia - murmur, Hypertension Denies history of Congestive Heart Failure, Coronary Artery Disease, Deep Vein Thrombosis, Hypotension, Myocardial Infarction, Peripheral Arterial Disease, Peripheral Venous Disease, Phlebitis, Vasculitis Gastrointestinal Denies history of Cirrhosis , Colitis, Crohn s, Hepatitis A, Hepatitis B, Hepatitis C Endocrine Denies history of Type I Diabetes, Type II Diabetes Genitourinary Denies history of End Stage Renal Disease Immunological Denies history of Lupus Erythematosus, Raynaud s, Scleroderma Integumentary (Skin) Denies history of History of Burn, History of pressure wounds Musculoskeletal Patient has history of Gout, Rheumatoid Arthritis, Osteoarthritis Denies history of Osteomyelitis Neurologic Denies history of Dementia, Neuropathy, Quadriplegia, Paraplegia, Seizure Disorder Oncologic Denies history of Received Chemotherapy, Received Radiation Psychiatric Denies history of Anorexia/bulimia, Confinement Anxiety Virrueta, Shanetha M. ([id number]) Objective Constitutional sitting or standing blood pressure is within target range for patient.. pulse regular and within target range for patient.Marland Kitchen respirations regular, non- labored and within target range for patient.Marland Kitchen temperature within target range for patient..  Well-nourished and well-hydrated in no acute distress. Vitals Time Taken: 9:46 AM, Height: 63 in, Weight: 142 lbs, BMI: 25.2, Temperature: 98 F, Pulse: 84 bpm, Respiratory Rate: 18 breaths/min, Blood Pressure: 141/76 mmHg. Eyes conjunctiva clear no eyelid edema noted. pupils equal round and reactive to light and accommodation. Ears, Nose, Mouth, and Throat no gross abnormality of ear auricles or external auditory canals. normal hearing noted during conversation. mucus membranes moist. Respiratory normal breathing without difficulty. Cardiovascular 1+ dorsalis pedis/posterior tibialis pulses. 2+ pitting edema of the bilateral lower extremities. Musculoskeletal Patient unable to walk without assistance. no significant deformity or arthritic changes, no loss or range of motion, no clubbing. Psychiatric this patient is able to make decisions and demonstrates good insight into disease process. Alert and Oriented x 3. pleasant and cooperative. General Notes: Upon inspection patient's wound bed actually shows signs of significant slough buildup there is also a tremendous amount of swelling noted at this point which is of concern as well. She is able to feel all over the area and in fact again is having discomfort at most spots whenever I touch or gently clean off this with saline gauze I did perform a saline gauze debridement today but no sharp debridement at this point due to the pain that she was experiencing. Integumentary (Hair, Skin) Wound #6 status is Open. Original cause of wound was Gradually Appeared. The wound is located on the Left,Lateral Lower Leg. The wound measures 12cm length x 17cm width x 0.1cm depth; 160.221cm^2 area and 16.022cm^3 volume. There is Fat Layer (Subcutaneous Tissue) exposed. There is no tunneling or undermining noted. There is a large amount of serous drainage noted. The wound margin is flat and intact. There is no granulation within the wound bed. There is a large  (67-100%) amount of necrotic tissue within the wound bed including Adherent Slough. Assessment Active Problems ICD-10 Lymphedema, not elsewhere classified Non-pressure chronic ulcer of other part of left lower leg with fat layer exposed Essential (primary) hypertension Other iron deficiency anemias Procedures Wound #6 Pre-procedure diagnosis of Wound #6 is a Lymphedema located on the Left,Lateral Lower Leg . There was a Four Layer Compression Therapy Procedure by Grover Canavan,  RN. Post procedure Diagnosis Wound #6: Same as Pre-Procedure Plan Kanno, Brieanna M. ([id number]) Wound Cleansing: Wound #6 Left,Lateral Lower Leg: Dial antibacterial soap, wash wounds, rinse and pat dry prior to dressing wounds Anesthetic (add to Medication List): Wound #6 Left,Lateral Lower Leg: Topical Lidocaine 4% cream applied to wound bed prior to debridement (In Clinic Only). Primary Wound Dressing: Wound #6 Left,Lateral Lower Leg: Silver Alginate - pack into pocket and put over wound as well Secondary Dressing: Wound #6 Left,Lateral Lower Leg: XtraSorb Dressing Change Frequency: Wound #6 Left,Lateral Lower Leg: Change dressing every week Follow-up Appointments: Wound #6 Left,Lateral Lower Leg: Return Appointment in 1 week. Edema Control: Wound #6 Left,Lateral Lower Leg: 4-Layer Compression System - Left Lower Extremity. Elevate legs to the level of the heart and pump ankles as often as possible Laboratory ordered were: Wound culture routine The following medication(s) was prescribed: Bactrim DS oral 800 mg-160 mg tablet 1 1 tablet oral taken 2 times per day for 14 days starting 06/22/2020 doxycycline hyclate oral 100 mg capsule 1 1 capsule oral taken 2 times per day for 14 days starting 06/22/2020 General Notes: start Bactrim 1. I did obtain a wound culture from the wound site today based on the fact that she did have erythema as well as a deeper wound/abscess with purulent drainage I felt  like this would be an appropriate thing to check I am not also place her on antibiotics I did initially give her a prescription for Bactrim DS so I did change this to the fact that she apparently is allergic to this we did not have that on record. We have updated that as of this point. I was appreciative of the pharmacist called me about this. 2. I am also can recommend that the patient have a silver alginate dressing followed by Lauraine Rinne over top of the wounds. 3. We will reinitiate the 4-layer compression wrap that she is previously tolerated with good result. We will see patient back for reevaluation in 1 week here in the clinic. If anything worsens or changes patient will contact our office for additional recommendations. Electronic Signature(s) Signed: 06/22/2020 2:44:11 PM By: Worthy Keeler PA-C Entered By: Worthy Keeler on 06/22/2020 14:44:11 Laday, [name] ([id number]) -------------------------------------------------------------------------------- ROS/PFSH Details Patient Name: [name] Date of Service: 06/22/2020 9:15 AM Medical Record Number: [id number] Patient Account Number: [id number] Date of Birth/Sex: [birth year] (79 y.o. F) Treating RN: Cornell Barman Primary Care Provider: Salome Holmes Other Clinician: Referring Provider: Salome Holmes Treating Provider/Extender: Melburn Hake, Willo Yoon Weeks in Treatment: 0 Information Obtained From Patient Eyes Medical History: Negative for: Cataracts; Glaucoma; Optic Neuritis Ear/Nose/Mouth/Throat Medical History: Positive for: Chronic sinus problems/congestion - allergys seasonal Negative for: Middle ear problems Hematologic/Lymphatic Medical History: Positive for: Anemia; Lymphedema Negative for: Hemophilia; Human Immunodeficiency Virus; Sickle Cell Disease Respiratory Medical History: Negative for: Aspiration; Asthma; Chronic Obstructive Pulmonary Disease (COPD); Pneumothorax; Sleep Apnea;  Tuberculosis Cardiovascular Medical History: Positive for: Angina; Arrhythmia - murmur; Hypertension Negative for: Congestive Heart Failure; Coronary Artery Disease; Deep Vein Thrombosis; Hypotension; Myocardial Infarction; Peripheral Arterial Disease; Peripheral Venous Disease; Phlebitis; Vasculitis Gastrointestinal Medical History: Negative for: Cirrhosis ; Colitis; Crohnos; Hepatitis A; Hepatitis B; Hepatitis C Endocrine Medical History: Negative for: Type I Diabetes; Type II Diabetes Genitourinary Medical History: Negative for: End Stage Renal Disease Immunological Medical History: Negative for: Lupus Erythematosus; Raynaudos; Scleroderma Integumentary (Skin) Medical History: Negative for: History of Burn; History of pressure wounds Musculoskeletal Leinen, Baby M. ([id number]) Medical History: Positive for: Gout; Rheumatoid Arthritis; Osteoarthritis  Negative for: Osteomyelitis Neurologic Medical History: Negative for: Dementia; Neuropathy; Quadriplegia; Paraplegia; Seizure Disorder Oncologic Medical History: Negative for: Received Chemotherapy; Received Radiation Psychiatric Medical History: Negative for: Anorexia/bulimia; Confinement Anxiety HBO Extended History Items Ear/Nose/Mouth/Throat: Chronic sinus problems/congestion Immunizations Pneumococcal Vaccine: Received Pneumococcal Vaccination: No Implantable Devices None Family and Social History Cancer: Yes - Mother; Diabetes: Yes - Siblings; Heart Disease: No; Hereditary Spherocytosis: No; Hypertension: Yes - Siblings; Kidney Disease: No; Lung Disease: Yes - Siblings; Seizures: No; Stroke: No; Thyroid Problems: No; Tuberculosis: No; Never smoker; Marital Status - Widowed; Alcohol Use: Never; Drug Use: No History; Caffeine Use: Never; Financial Concerns: No; Food, Clothing or Shelter Needs: No; Support System Lacking: No; Transportation Concerns: No Electronic Signature(s) Signed: 06/22/2020 10:47:19 AM By:  Darci Needle Signed: 06/22/2020 4:38:18 PM By: Worthy Keeler PA-C Signed: 06/26/2020 4:28:55 PM By: Gretta Cool BSN, RN, CWS, Kim RN, BSN Entered By: Darci Needle on 06/22/2020 10:01:49 Stuckert, [name] ([id number]) -------------------------------------------------------------------------------- SuperBill Details Patient Name: [name] Date of Service: 06/22/2020 Medical Record Number: [id number] Patient Account Number: [id number] Date of Birth/Sex: 07-07-1941 (79 y.o. F) Treating RN: Grover Canavan Primary Care Provider: Salome Holmes Other Clinician: Referring Provider: Salome Holmes Treating Provider/Extender: Melburn Hake, Nyazia Canevari Weeks in Treatment: 0 Diagnosis Coding ICD-10 Codes Code Description I89.0 Lymphedema, not elsewhere classified L97.822 Non-pressure chronic ulcer of other part of left lower leg with fat layer exposed I10 Essential (primary) hypertension D50.8 Other iron deficiency anemias Facility Procedures CPT4 Code: 27670110 Description: 03496 - WOUND CARE VISIT-LEV 4 EST PT Modifier: Quantity: 1 CPT4 Code: 11643539 Description: (Facility Use Only) 29581LT - [place] LWR LT LEG Modifier: Quantity: 1 Physician Procedures CPT4 Code: 1225834 Description: 62194 - WC PHYS LEVEL 3 - EST PT Modifier: Quantity: 1 CPT4 Code: Description: ICD-10 Diagnosis Description I89.0 Lymphedema, not elsewhere classified L97.822 Non-pressure chronic ulcer of other part of left lower leg with fat lay I10 Essential (primary) hypertension D50.8 Other iron deficiency anemias Modifier: er exposed Quantity: Electronic Signature(s) Signed: 06/22/2020 2:44:26 PM By: Worthy Keeler PA-C Entered By: Worthy Keeler on 06/22/2020 14:44:25

## 2020-07-02 ENCOUNTER — Ambulatory Visit: Payer: Medicare Other | Admitting: Physician Assistant

## 2020-07-16 ENCOUNTER — Inpatient Hospital Stay: Payer: Medicare Other | Admitting: Hematology and Oncology

## 2020-07-23 NOTE — Progress Notes (Signed)
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  HPI:  The patient was last seen in the medical oncology clinic on 05/16/2020. At that time, she felt "good".  She fell asleep easily.  She was no longer wrapping her lower extremities but using compressions socks.  She denied any bleeding.  Hematocrit was 39.7, hemoglobin 12.0, platelets 131,000, WBC 11,300 (ANC 7,900). BUN was 40. Creatinine was 1.74 (CrCl 32 ml/min). Total protein was 8.5. LDH was 124.  SPEP showed a polyclonal increase in gamma globulin. No M-spike was observed.  The patient saw Jeri Cos PA in wound care on 06/25/2020. A culture of her left leg ulcer had showed Morganella morganii and Serratia marcescens. It was felt that she needed greater intervention that initially suspected and she was sent to the [facility] ER for IV antibiotics.  The patient was admitted to [facility] from 06/25/2020 - 07/05/2020 for a left lower extremity wound. Blood cultures on 06/25/2020 revealed staph hominis, staph Cohnii/epidermidis.  Bacteremia was most likely secondary to polymicrobial wound infection.  Echo on 06/29/2020 revealed no vegetations and an EF > 55%.  Dermatopathology ruled out pyoderma gangrenosum.  He underwent wound debridement on 06/29/2020.  Intraoperative wound cultures grew mixed gram-positive and gram-negative organisms.  She was maintained on ceftazidime, vancomycin and Flagyl per ID.  Risk factors for infection include her history of CMML, malnutrition, chronic wounds, lymphedema and chronic vascular insufficiency.  IV antibiotics were transitioned to Augmentin on 07/03/2020.  She was discharged on at least a 4-week course of  Augmentin per ID.  She has a follow-up with ID on 07/31/2020.  Lower extremity MRI revealed no signal abnormality to suggest osteomyelitis. There was circumferential subcutaneous and perifascial edema with skin thickening, L>R, compatible with cellulitis. There was soft tissue ulceration in the superficial soft tissues of the anterolateral left lower leg. There was no underlying loculated fluid collection. There was scattered intramuscular edema throughout the bilateral lower extremities that couldhave been infectious or inflammatory in etiology.   [facility] labs followed: 06/25/2020: Hematocrit 34.1, hemoglobin 10.9, MCV   89.4, platelets 201,000, WBC 39,500 (ANC 30.900). 07/05/2020: Hematocrit 29.4, hemoglobin   8.5, MCV 101.5, platelets 124,000, WBC 38,400.  During the interim, she has been "alright." She has been weak since leaving the hospital. She is tolerating Augmentin well. She feels stiff. Her leg swelling has been much better lately. She denies fevers, cough, urinary symptoms, nausea, vomiting, diarrhea, blood in the stool, and black stool. She uses 2 liters/min oxygen prn if she exerts herself too much or gets excited.  She rests a lot throughout the day and takes naps. OT and PT recently started visiting her at home. She is able to eat on her own but needs assistance with all other ADLs. She began eating well a couple of days ago. She has been eating chicken salad, hot dogs, and 2 Boosts daily.  Her BP medication is managed by Dr. Iona Beard at [facility]. Her blood pressure has been running low at home. She is still taking her blood pressure medications.   Past Medical History:  Diagnosis Date  . Allergic rhinitis   . Arthritis   . CML (chronic myelocytic leukemia) ([place])   . GERD (  gastroesophageal reflux disease)   . Gout   . History of goiter   . Hyperlipidemia   . Hypertension   . Hypothyroidism   . IDA (iron deficiency anemia)   . ITP (idiopathic thrombocytopenic purpura)    . Urinary incontinence     Past Surgical History:  Procedure Laterality Date  . INCISION AND DRAINAGE Right 06/05/2015   Procedure: INCISION AND DRAINAGE, RIGHT WRIST;  Surgeon: Milly Jakob, MD;  Location: [place];  Service: Orthopedics;  Laterality: Right;    Family History  Problem Relation Age of Onset  . Heart attack Mother   . Hypertension Mother   . Cancer Father   . Hypertension Father   . Breast cancer Paternal Grandmother   . Diabetes Mellitus II Sister     Social History:  reports that she has never smoked. She has never used smokeless tobacco. She reports that she does not drink alcohol and does not use drugs. She denies any alcohol or tobacco use. She denies any exposure to radiation or toxins. She is retired but used to work in a [employer]. She lives in [place]. The patient is accompanied by her son, [name], today.  Allergies:  Allergies  Allergen Reactions  . Sulfa Antibiotics Rash    Katherina Right Syndrome  . Sulfamethoxazole-Trimethoprim Hives and Rash    Avel Sensor   . Aspirin Rash    Current Medications: Current Outpatient Medications  Medication Sig Dispense Refill  . acetaminophen (TYLENOL) 650 MG CR tablet Take 1 tablet (650 mg total) by mouth every 6 (six) hours. 30 tablet 0  . amLODipine (NORVASC) 10 MG tablet Take 10 mg by mouth daily.     . Calcium Carbonate-Vitamin D (CALCIUM-VITAMIN D) 500-200 MG-UNIT tablet Take 1 tablet by mouth daily.    . chlorthalidone (HYGROTON) 25 MG tablet Take 0.5 tablets by mouth daily.    . Cholecalciferol (VITAMIN D3) 1000 units CAPS Take 1 capsule by mouth 1 day or 1 dose.    . docusate sodium (COLACE) 100 MG capsule Take 100 mg by mouth 2 (two) times daily.    Marland Kitchen ethacrynic acid (EDECRIN) 25 MG tablet Take 1 tablet by mouth daily.    . febuxostat (ULORIC) 40 MG tablet Take 40 mg by mouth daily.     . Fish Oil-Cholecalciferol (FISH OIL + D3 PO) Take by mouth daily.    . fluticasone (FLONASE) 50 MCG/ACT nasal  spray Place 1 spray into both nostrils daily. Reported on 05/17/4626  11  . folic acid (FOLVITE) 1 MG tablet Take 1 tablet (1 mg total) by mouth daily. 30 tablet 0  . hydrALAZINE (APRESOLINE) 25 MG tablet Take 25 mg by mouth daily.     . hydroxychloroquine (PLAQUENIL) 200 MG tablet Take 200 mg by mouth daily.    Marland Kitchen ipratropium (ATROVENT) 0.06 % nasal spray Place 2 sprays into both nostrils as needed. Head cold symptoms    . losartan (COZAAR) 25 MG tablet Take 25 mg by mouth daily.     . metoprolol tartrate (LOPRESSOR) 25 MG tablet Take 25 mg by mouth 2 (two) times daily. Reported on 12/18/2015    . Multiple Vitamins-Minerals (ICAPS) CAPS Take 1 capsule by mouth 1 day or 1 dose.    . oxybutynin (DITROPAN) 5 MG tablet Take 5 mg by mouth daily.  5  . pantoprazole (PROTONIX) 40 MG tablet Take 40 mg by mouth daily before breakfast.    . Polyethyl Glycol-Propyl Glycol (SYSTANE) 0.4-0.3 % SOLN Apply to eye.    Marland Kitchen  potassium chloride (KLOR-CON) 10 MEQ tablet Take 10 mEq by mouth daily.     . Multiple Vitamin (MULTI-VITAMIN) tablet Take 1 tablet by mouth daily.     . potassium chloride (K-DUR,KLOR-CON) 10 MEQ tablet Take 1 tablet by mouth daily.  11   No current facility-administered medications for this visit.    Review of Systems  Constitutional: Positive for weight loss (10 lbs). Negative for chills, diaphoresis, fever and malaise/fatigue.       Feels "alright." Needs assistance with ADLs.  HENT: Negative.  Negative for congestion, ear discharge, ear pain, hearing loss, nosebleeds, sinus pain, sore throat and tinnitus.   Eyes: Negative.  Negative for blurred vision, double vision and photophobia.  Respiratory: Positive for shortness of breath. Negative for cough, sputum production and wheezing.        2 liters/min oxygen prn  Cardiovascular: Negative for chest pain, palpitations, orthopnea, leg swelling and PND.  Gastrointestinal: Negative for abdominal pain, blood in stool, constipation, diarrhea,  heartburn, melena, nausea and vomiting.       Appetite improving. Drinks 2 Boosts daily.  Genitourinary: Negative for dysuria, frequency, hematuria and urgency.  Musculoskeletal: Negative for back pain, joint pain (stiff), myalgias and neck pain.  Skin: Negative for itching and rash.  Neurological: Positive for weakness (since leaving hospital). Negative for dizziness, tingling, sensory change, speech change, focal weakness and headaches.  Endo/Heme/Allergies: Does not bruise/bleed easily.  Psychiatric/Behavioral: Negative for depression, memory loss and substance abuse. The patient has insomnia (due to naps throughout the day). The patient is not nervous/anxious.   All other systems reviewed and are negative.  Performance status (ECOG): 3  Vitals Blood pressure (!) 99/44, pulse 77, temperature (!) 94.3 F (34.6 C), temperature source Tympanic, resp. rate 18, weight 129 lb 6.4 oz (58.7 kg), SpO2 100 %.   Physical Exam Vitals and nursing note reviewed.  Constitutional:      General: She is not in acute distress.    Appearance: She is well-developed. She is not ill-appearing or diaphoretic.     Comments: Patient sitting comfortably under a blanket in a wheelchair in no acute distress. She is examined in the wheelchair.  HENT:     Head: Normocephalic and atraumatic.     Comments: Black bonnet.  Short gray hair.    Mouth/Throat:     Pharynx: Oropharynx is clear. No oropharyngeal exudate or posterior oropharyngeal erythema.  Eyes:     General: No scleral icterus.    Extraocular Movements: Extraocular movements intact.     Conjunctiva/sclera: Conjunctivae normal.     Pupils: Pupils are equal, round, and reactive to light.     Comments: Brown eyes.  Neck:     Vascular: No JVD.  Cardiovascular:     Rate and Rhythm: Normal rate and regular rhythm.     Heart sounds: Normal heart sounds. No murmur heard.   Pulmonary:     Effort: Pulmonary effort is normal. No respiratory distress.      Breath sounds: Normal breath sounds. No wheezing or rales.  Chest:     Chest wall: No tenderness.  Abdominal:     General: Bowel sounds are normal. There is no distension.     Palpations: Abdomen is soft. There is no hepatomegaly, splenomegaly or mass.     Tenderness: There is no abdominal tenderness. There is no guarding or rebound.  Musculoskeletal:        General: No swelling or tenderness. Normal range of motion.     Cervical back:  Normal range of motion and neck supple.     Right lower leg: Edema present.     Left lower leg: Edema (L>R) present.     Comments: Left foot turned outwards.  Lymphadenopathy:     Head:     Right side of head: No preauricular, posterior auricular or occipital adenopathy.     Left side of head: No preauricular, posterior auricular or occipital adenopathy.     Cervical: No cervical adenopathy.     Upper Body:     Right upper body: No supraclavicular or axillary adenopathy.     Left upper body: No supraclavicular or axillary adenopathy.     Lower Body: No right inguinal adenopathy. No left inguinal adenopathy.  Skin:    General: Skin is warm and dry.  Neurological:     Mental Status: She is alert and oriented to person, place, and time.  Psychiatric:        Behavior: Behavior normal.        Thought Content: Thought content normal.        Judgment: Judgment normal.    Appointment on 07/24/2020  Component Date Value Ref Range Status  . Uric Acid, Serum 07/24/2020 9.4* 2.5 - 7.1 mg/dL Final   Performed at [facility], [address] [place], [place] [address]  . LDH 07/24/2020 127  98 - 192 U/L Final   Performed at [facility], [address], [place], [place] [address]  . Sodium 07/24/2020 132* 135 - 145 mmol/L Final  . Potassium 07/24/2020 5.8* 3.5 - 5.1 mmol/L Final  . Chloride 07/24/2020 100  98 - 111 mmol/L Final  . CO2 07/24/2020 25  22 - 32 mmol/L Final  . Glucose, Bld 07/24/2020 136* 70 - 99 mg/dL Final    Glucose reference range applies only to samples taken after fasting for at least 8 hours.  . BUN 07/24/2020 81* 8 - 23 mg/dL Final  . Creatinine, Ser 07/24/2020 6.37* 0.44 - 1.00 mg/dL Final  . Calcium 07/24/2020 7.7* 8.9 - 10.3 mg/dL Final  . Total Protein 07/24/2020 6.8  6.5 - 8.1 g/dL Final  . Albumin 07/24/2020 2.7* 3.5 - 5.0 g/dL Final  . AST 07/24/2020 19  15 - 41 U/L Final  . ALT 07/24/2020 15  0 - 44 U/L Final  . Alkaline Phosphatase 07/24/2020 66  38 - 126 U/L Final  . Total Bilirubin 07/24/2020 0.7  0.3 - 1.2 mg/dL Final  . GFR, Estimated 07/24/2020 6* >60 mL/min Final  . Anion gap 07/24/2020 7  5 - 15 Final   Performed at [facility], [address] [place], [place] [address]    Assessment:  ORIS CALMES is a 79 y.o. 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 She presented with weight loss. WBC has ranged 25,000 - 30,000 with predominant monocytosis.   Bone marrow aspirate and biopsy on 08/22/2015 revealed an increased atypical monocytic cells (30%) with multi-lineage dysplaisa.  There was no significant myeloid abnormalities or increase in blasts (< 5%).   Cytogenetics were normal (86, XX).  Findings favored a diagnosis of CMML.   She has a history of chronic normocytic anemia felt secondary to chronic kidney disease (CKD).   She has previously been treated with Venofer and blood transfusions. She has received Venofer on 03/06/2015 and 03/28/2015.  Ferritin has been followed: 270 on 04/27/2013, 293 on 04/26/2013, 286 on 06/16/2014, 430 on 02/13/2015, 393 on 03/20/2015, 524 on 04/05/2015, 373 on 04/24/2015, 495  on 05/15/2015, and 659 on 07/03/2015.   She has a history of idiopathic thrombocytopenia purpura (ITP).  Platelet count has ranged between 90,000 - 130,000.  She was previously treated with prednisone.   She has a history of an elevated serum protein.  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  The patient was admitted to [facility] from 06/25/2020 -  07/05/2020 for a left lower extremity wound. Blood cultures on 06/25/2020 grew staph hominis, staph Cohnii/epidermidis.  Echo on 06/29/2020 revealed no vegetations and an EF > 55%.  Dermatopathology ruled out pyoderma gangrenosum.  She underwent wound debridement on 06/29/2020.  Intraoperative wound cultures grew mixed gram-positive and gram-negative organisms.  She was maintained on ceftazidime, vancomycin and Flagyl per ID.  IV antibiotics were transitioned to Augmentin on 07/03/2020.  She was discharged on at least a 4-week course of Augmentin.   Symptomatically, she feels "alright" after her recent admission to [facility].  She has been eating well over the past 2 days.  Blood pressure is low on multiple blood pressure medications.  Creatinine is 6.37.  Potassium is 5.8.  Plan: 1.   Labs today:  CBC with diff, CMP, LDH, uric acid, ferritin, iron studies, B12, folate. 2.   Chronic myelomonocytic leukemia (CMML)   Hematocrit 34.1, hemoglobin 10.9, MCV   89.4, platelets 201,000, WBC 39,500 ([place] 30,900) on 06/25/2020.  Hematocrit 29.4, hemoglobin   8.5, MCV 101.5, platelets 124,000, WBC 38,400 on 07/05/2020.   CBC pending today.  Prior marrow and labs have been c/w CMM-0.             Patient's WBC has increased since last visit.   Peripheral smear for path review today.             Indications for treatment include:                         B symptoms, change in counts (increased WBC/blasts, increased cytopenias), and organ involvement.                         Treatment typically consists of hydroxyurea or hypomethylating agents.             Await today's labs. 3.   Anemia  Labs from [facility] note new anemia.  No bleeding per patient.    Diet has been modest, but increasing lately.  Labs added (ferritin, iron studies, B12, folate). 4.   Acute renal insufficiency  Creatinine 6.37.  Potassium 5.8   Labs on 07/05/2020 included a creatinine 1.23 and potassium 4.4  No new medications except for Augmentin.   Son notes decreased fluid intake.  Suspect a component of dehydration.  Patient's blood pressure low on anti-hypertensive medications.  Family contacted for immediate ER evaluation. 5.   Low blood pressure  BP 99/44 today.  Last visit was 177/86 on 05/16/2020  RN to contact Dr Iona Beard re: blood pressure and her BP meds. 6.   RTC in 4 months for MD assessment and labs (CBC with diff, LDH, uric acid).  Addendum:  The patient's son and daughter were contacted regarding her acute renal insufficiency and need to be seen immediately in the ER for admission.  They declined admission to [facility], but agreed to evaluation in the ER at [facility].  The triage nurse was contacted at [phone].  Report was given by me.  I discussed the assessment and treatment plan with the patient.  The patient was provided  an opportunity to ask questions and all were answered.  The patient agreed with the plan and demonstrated an understanding of the instructions.  The patient was advised to call back if the symptoms worsen or if the condition fails to improve as anticipated.   Lequita Asal, MD, PhD    07/24/2020, 12:57 PM  I, Lequita Asal, am acting as a Education administrator for Lequita Asal, MD.  I, [place] Mike Gip, MD, have reviewed the above documentation for accuracy and completeness, and I agree with the above.

## 2020-07-24 ENCOUNTER — Inpatient Hospital Stay: Payer: Medicare Other | Attending: Hematology and Oncology | Admitting: Hematology and Oncology

## 2020-07-24 ENCOUNTER — Inpatient Hospital Stay: Payer: Medicare Other

## 2020-07-24 ENCOUNTER — Other Ambulatory Visit: Payer: Self-pay

## 2020-07-24 ENCOUNTER — Encounter: Payer: Self-pay | Admitting: Hematology and Oncology

## 2020-07-24 ENCOUNTER — Telehealth: Payer: Self-pay | Admitting: Hematology and Oncology

## 2020-07-24 VITALS — BP 99/44 | HR 77 | Temp 94.3°F | Resp 18 | Wt 129.4 lb

## 2020-07-24 DIAGNOSIS — D649 Anemia, unspecified: Secondary | ICD-10-CM

## 2020-07-24 DIAGNOSIS — Z993 Dependence on wheelchair: Secondary | ICD-10-CM | POA: Diagnosis not present

## 2020-07-24 DIAGNOSIS — C9310 Chronic myelomonocytic leukemia not having achieved remission: Secondary | ICD-10-CM

## 2020-07-24 DIAGNOSIS — E875 Hyperkalemia: Secondary | ICD-10-CM | POA: Diagnosis not present

## 2020-07-24 DIAGNOSIS — Z9981 Dependence on supplemental oxygen: Secondary | ICD-10-CM | POA: Insufficient documentation

## 2020-07-24 DIAGNOSIS — Z7189 Other specified counseling: Secondary | ICD-10-CM

## 2020-07-24 DIAGNOSIS — I959 Hypotension, unspecified: Secondary | ICD-10-CM | POA: Diagnosis not present

## 2020-07-24 DIAGNOSIS — N289 Disorder of kidney and ureter, unspecified: Secondary | ICD-10-CM | POA: Diagnosis not present

## 2020-07-24 DIAGNOSIS — R779 Abnormality of plasma protein, unspecified: Secondary | ICD-10-CM

## 2020-07-24 LAB — COMPREHENSIVE METABOLIC PANEL
ALT: 15 U/L (ref 0–44)
AST: 19 U/L (ref 15–41)
Albumin: 2.7 g/dL — ABNORMAL LOW (ref 3.5–5.0)
Alkaline Phosphatase: 66 U/L (ref 38–126)
Anion gap: 7 (ref 5–15)
BUN: 81 mg/dL — ABNORMAL HIGH (ref 8–23)
CO2: 25 mmol/L (ref 22–32)
Calcium: 7.7 mg/dL — ABNORMAL LOW (ref 8.9–10.3)
Chloride: 100 mmol/L (ref 98–111)
Creatinine, Ser: 6.37 mg/dL — ABNORMAL HIGH (ref 0.44–1.00)
GFR, Estimated: 6 mL/min — ABNORMAL LOW (ref >60)
Glucose, Bld: 136 mg/dL — ABNORMAL HIGH (ref 70–99)
Potassium: 5.8 mmol/L — ABNORMAL HIGH (ref 3.5–5.1)
Sodium: 132 mmol/L — ABNORMAL LOW (ref 135–145)
Total Bilirubin: 0.7 mg/dL (ref 0.3–1.2)
Total Protein: 6.8 g/dL (ref 6.5–8.1)

## 2020-07-24 LAB — CBC WITH DIFFERENTIAL/PLATELET
Abs Immature Granulocytes: 1.4 10*3/uL — ABNORMAL HIGH (ref 0.00–0.07)
Basophils Absolute: 0 10*3/uL (ref 0.0–0.1)
Basophils Relative: 0 %
Eosinophils Absolute: 0.3 10*3/uL (ref 0.0–0.5)
Eosinophils Relative: 1 %
HCT: 28.7 % — ABNORMAL LOW (ref 36.0–46.0)
Hemoglobin: 8.3 g/dL — ABNORMAL LOW (ref 12.0–15.0)
Lymphocytes Relative: 7 %
Lymphs Abs: 2 10*3/uL (ref 0.7–4.0)
MCH: 29 pg (ref 26.0–34.0)
MCHC: 28.9 g/dL — ABNORMAL LOW (ref 30.0–36.0)
MCV: 100.3 fL — ABNORMAL HIGH (ref 80.0–100.0)
Metamyelocytes Relative: 1 %
Monocytes Absolute: 5.6 10*3/uL — ABNORMAL HIGH (ref 0.1–1.0)
Monocytes Relative: 20 %
Myelocytes: 4 %
Neutro Abs: 18.7 10*3/uL — ABNORMAL HIGH (ref 1.7–7.7)
Neutrophils Relative %: 67 %
Platelets: 130 10*3/uL — ABNORMAL LOW (ref 150–400)
RBC: 2.86 MIL/uL — ABNORMAL LOW (ref 3.87–5.11)
RDW: 18.5 % — ABNORMAL HIGH (ref 11.5–15.5)
Sickle Cells: ABSENT
WBC: 27.9 10*3/uL — ABNORMAL HIGH (ref 4.0–10.5)
nRBC: 0 % (ref 0.0–0.2)

## 2020-07-24 LAB — URIC ACID: Uric Acid, Serum: 9.4 mg/dL — ABNORMAL HIGH (ref 2.5–7.1)

## 2020-07-24 LAB — VITAMIN B12: Vitamin B-12: 884 pg/mL (ref 180–914)

## 2020-07-24 LAB — IRON AND TIBC
Iron: 100 ug/dL (ref 28–170)
Saturation Ratios: 95 % — ABNORMAL HIGH (ref 10.4–31.8)
TIBC: 105 ug/dL — ABNORMAL LOW (ref 250–450)
UIBC: 5 ug/dL

## 2020-07-24 LAB — FOLATE: Folate: 40 ng/mL (ref >5.9)

## 2020-07-24 LAB — LACTATE DEHYDROGENASE: LDH: 127 U/L (ref 98–192)

## 2020-07-24 LAB — FERRITIN: Ferritin: 677 ng/mL — ABNORMAL HIGH (ref 11–307)

## 2020-07-24 NOTE — Progress Notes (Signed)
No new changes noted today 

## 2020-07-24 NOTE — Telephone Encounter (Signed)
error 

## 2020-07-25 ENCOUNTER — Telehealth: Payer: Self-pay

## 2020-07-25 LAB — PATHOLOGIST SMEAR REVIEW

## 2020-07-25 NOTE — Telephone Encounter (Signed)
Spoke with the patient [name] office to inform them that her b/p was running low and she is currently taking multiple b/p medication. I have also inform them that her Creatinine level had increased and her potassium level had increased , Per Dr Mike Gip she requested that the patient go to the ED. After speaking with her son and daughter they was agreeable to take her to [facility].

## 2020-10-13 DEATH — deceased

## 2020-11-15 ENCOUNTER — Ambulatory Visit: Payer: Medicare Other | Admitting: Hematology and Oncology

## 2020-11-15 ENCOUNTER — Other Ambulatory Visit: Payer: Medicare Other
